# Patient Record
Sex: Male | Born: 1955 | Race: White | Hispanic: No | Marital: Married | Smoking: Former smoker
Health system: Southern US, Community
[De-identification: ages and names within clinical notes are randomized; demographics above are authoritative.]

## PROBLEM LIST (undated history)

## (undated) DIAGNOSIS — M1990 Unspecified osteoarthritis, unspecified site: Secondary | ICD-10-CM

## (undated) DIAGNOSIS — M1711 Unilateral primary osteoarthritis, right knee: Secondary | ICD-10-CM

## (undated) DIAGNOSIS — R011 Cardiac murmur, unspecified: Secondary | ICD-10-CM

## (undated) DIAGNOSIS — D649 Anemia, unspecified: Secondary | ICD-10-CM

## (undated) DIAGNOSIS — M25331 Other instability, right wrist: Secondary | ICD-10-CM

## (undated) DIAGNOSIS — E559 Vitamin D deficiency, unspecified: Secondary | ICD-10-CM

## (undated) DIAGNOSIS — E785 Hyperlipidemia, unspecified: Secondary | ICD-10-CM

## (undated) DIAGNOSIS — I10 Essential (primary) hypertension: Secondary | ICD-10-CM

## (undated) HISTORY — DX: Vitamin D deficiency, unspecified: E55.9

## (undated) HISTORY — PX: TONSILLECTOMY: SUR1361

## (undated) HISTORY — PX: KNEE ARTHROSCOPY: SUR90

## (undated) HISTORY — DX: Anemia, unspecified: D64.9

---

## 2002-06-08 ENCOUNTER — Encounter (HOSPITAL_COMMUNITY): Admission: RE | Admit: 2002-06-08 | Discharge: 2002-07-08 | Payer: Self-pay | Admitting: *Deleted

## 2002-06-22 ENCOUNTER — Ambulatory Visit (HOSPITAL_COMMUNITY): Admission: RE | Admit: 2002-06-22 | Discharge: 2002-06-22 | Payer: Self-pay | Admitting: *Deleted

## 2002-06-25 ENCOUNTER — Ambulatory Visit (HOSPITAL_COMMUNITY): Admission: RE | Admit: 2002-06-25 | Discharge: 2002-06-25 | Payer: Self-pay | Admitting: *Deleted

## 2002-07-02 ENCOUNTER — Ambulatory Visit (HOSPITAL_COMMUNITY): Admission: RE | Admit: 2002-07-02 | Discharge: 2002-07-02 | Payer: Self-pay | Admitting: Family Medicine

## 2002-07-02 ENCOUNTER — Encounter: Payer: Self-pay | Admitting: Family Medicine

## 2003-04-10 HISTORY — PX: CARDIAC CATHETERIZATION: SHX172

## 2003-09-30 ENCOUNTER — Ambulatory Visit (HOSPITAL_COMMUNITY): Admission: RE | Admit: 2003-09-30 | Discharge: 2003-09-30 | Payer: Self-pay | Admitting: *Deleted

## 2004-04-21 ENCOUNTER — Ambulatory Visit: Payer: Self-pay | Admitting: Family Medicine

## 2004-06-06 ENCOUNTER — Ambulatory Visit: Payer: Self-pay | Admitting: Family Medicine

## 2004-08-30 ENCOUNTER — Ambulatory Visit: Payer: Self-pay | Admitting: Family Medicine

## 2004-09-13 ENCOUNTER — Ambulatory Visit: Payer: Self-pay | Admitting: Family Medicine

## 2004-10-11 ENCOUNTER — Ambulatory Visit: Payer: Self-pay | Admitting: Family Medicine

## 2005-02-12 ENCOUNTER — Ambulatory Visit: Payer: Self-pay | Admitting: Family Medicine

## 2005-03-12 ENCOUNTER — Ambulatory Visit: Payer: Self-pay | Admitting: Family Medicine

## 2005-05-01 ENCOUNTER — Ambulatory Visit: Payer: Self-pay | Admitting: Family Medicine

## 2005-07-04 ENCOUNTER — Ambulatory Visit: Payer: Self-pay | Admitting: Family Medicine

## 2005-07-24 ENCOUNTER — Ambulatory Visit: Payer: Self-pay | Admitting: *Deleted

## 2005-08-06 ENCOUNTER — Ambulatory Visit: Payer: Self-pay | Admitting: Family Medicine

## 2005-11-05 ENCOUNTER — Ambulatory Visit: Payer: Self-pay | Admitting: Family Medicine

## 2006-01-31 ENCOUNTER — Ambulatory Visit: Payer: Self-pay | Admitting: Family Medicine

## 2006-02-11 ENCOUNTER — Ambulatory Visit: Payer: Self-pay | Admitting: Family Medicine

## 2006-09-12 ENCOUNTER — Ambulatory Visit: Payer: Self-pay | Admitting: Cardiovascular Disease

## 2007-02-19 ENCOUNTER — Ambulatory Visit: Payer: Self-pay | Admitting: Cardiovascular Disease

## 2007-02-19 ENCOUNTER — Encounter (HOSPITAL_COMMUNITY): Admission: RE | Admit: 2007-02-19 | Discharge: 2007-03-21 | Payer: Self-pay | Admitting: Cardiovascular Disease

## 2007-04-07 ENCOUNTER — Ambulatory Visit: Payer: Self-pay | Admitting: Cardiovascular Disease

## 2007-04-10 HISTORY — PX: CARPAL TUNNEL RELEASE: SHX101

## 2007-11-05 ENCOUNTER — Ambulatory Visit: Payer: Self-pay | Admitting: Cardiovascular Disease

## 2008-03-18 ENCOUNTER — Ambulatory Visit (HOSPITAL_BASED_OUTPATIENT_CLINIC_OR_DEPARTMENT_OTHER): Admission: RE | Admit: 2008-03-18 | Discharge: 2008-03-18 | Payer: Self-pay | Admitting: Orthopedic Surgery

## 2008-03-18 DIAGNOSIS — Z9889 Other specified postprocedural states: Secondary | ICD-10-CM | POA: Insufficient documentation

## 2008-06-22 LAB — HM COLONOSCOPY

## 2008-07-23 ENCOUNTER — Telehealth: Payer: Self-pay | Admitting: Cardiovascular Disease

## 2008-10-06 DIAGNOSIS — I10 Essential (primary) hypertension: Secondary | ICD-10-CM

## 2008-10-06 DIAGNOSIS — I152 Hypertension secondary to endocrine disorders: Secondary | ICD-10-CM | POA: Insufficient documentation

## 2008-10-06 DIAGNOSIS — E119 Type 2 diabetes mellitus without complications: Secondary | ICD-10-CM | POA: Insufficient documentation

## 2008-10-06 DIAGNOSIS — E1159 Type 2 diabetes mellitus with other circulatory complications: Secondary | ICD-10-CM

## 2008-10-06 DIAGNOSIS — M069 Rheumatoid arthritis, unspecified: Secondary | ICD-10-CM | POA: Insufficient documentation

## 2008-10-06 DIAGNOSIS — E1169 Type 2 diabetes mellitus with other specified complication: Secondary | ICD-10-CM | POA: Insufficient documentation

## 2008-10-06 DIAGNOSIS — E785 Hyperlipidemia, unspecified: Secondary | ICD-10-CM

## 2008-10-07 ENCOUNTER — Ambulatory Visit: Payer: Self-pay | Admitting: Cardiovascular Disease

## 2009-04-11 ENCOUNTER — Ambulatory Visit: Payer: Self-pay | Admitting: Cardiovascular Disease

## 2009-10-07 ENCOUNTER — Encounter: Payer: Self-pay | Admitting: Cardiovascular Disease

## 2010-03-06 ENCOUNTER — Encounter: Payer: Self-pay | Admitting: Cardiovascular Disease

## 2010-04-09 HISTORY — PX: OTHER SURGICAL HISTORY: SHX169

## 2010-05-09 ENCOUNTER — Encounter: Payer: Self-pay | Admitting: Cardiovascular Disease

## 2010-05-09 ENCOUNTER — Ambulatory Visit
Admission: RE | Admit: 2010-05-09 | Discharge: 2010-05-09 | Payer: Self-pay | Source: Home / Self Care | Attending: Cardiovascular Disease | Admitting: Cardiovascular Disease

## 2010-05-09 NOTE — Assessment & Plan Note (Signed)
Summary: 6 mo F/U   History of Present Illness: [name] is seen today in F/U of CRF's.  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  I reviewed his risk factor profile from [facility].  His HDL was low at 32.  He was given samples of Niaspan by Dr Elana Alm and I told him he should try them  He will take a coated aspirin 30 minutes prior to .  His weight is still up and he needs to focus on a more low carb diet.  He eats out a lot and has large portions.  He has seen a nutrutionist and is working on this.  He denies recurrent SSCP.  He has lower back problems and complains about his legs aching in the morning.  He has been compliant with his meds.  He quit smoking in 1998.  He is sedentary and still overweight with a BMI of 37.4.  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 1 Tab By Mouth Once Daily 2)  Aspirin 81 Mg Tabs (Aspirin) .... Take By Mouth Once Daily 3)  Metformin Hcl 500 Mg Tabs (Metformin Hcl) .... 2 Tabs By Mouth Two Times A Day 4)  Simvastatin 10 Mg Tabs (Simvastatin) .... Take One Tablet By Mouth Daily At Bedtime 5)  Meloxicam 15 Mg Tabs (Meloxicam) .[name].. 1 Tab By Mouth Once Daily  Allergies (verified): 1)  ! * Shellfish  Past History:  Past Medical History: Last updated: 10/06/2008 ARTHRITIS (ICD-716.90) HYPERLIPIDEMIA (ICD-272.4) DIABETES MELLITUS (ICD-250.00) HYPERTENSION (ICD-401.9) CARDIOMYOPATHY, HX OF (ICD-425.4)    Past Surgical History: Last updated: 10/06/2008 CARPAL TUNNEL RELEASE, LEFT, HX OF (ICD-V45.89)  Family History: Last updated: 10/06/2008 Father:hx of cva, hypertension,  diabetes Mother:well Siblings: 2 sisters alive and well.  Social History: Last updated: 10/06/2008 Full Time Married  2 children  Review of Systems       Denies fever, malais, weight loss, blurry vision, decreased visual acuity, cough, sputum, SOB, hemoptysis, pleuritic pain, palpitaitons, heartburn, abdominal pain, melena, lower extremity edema, claudication, or rash. All other systems reviewed and negative  Vital Signs:  Patient profile:   55 year old male Height:      70 inches Weight:      260 pounds BMI:     37.44 Pulse rate:   80 / minute Resp:     12 per minute BP sitting:   140 / 80  (left arm)  Vitals Entered By: Kem Parkinson (April 11, 2009 3:12 PM)  Physical Exam  General:  Affect appropriate Healthy:  appears stated age HEENT: normal Neck supple with no adenopathy JVP normal no bruits no thyromegaly Lungs clear with no wheezing and good diaphragmatic motion Heart:  S1/S2 no murmur,rub, gallop or click PMI normal Abdomen: benighn, BS positve, no tenderness, no AAA no bruit.  No HSM or HJR Distal pulses intact with no bruits No edema Neuro non-focal Skin warm and dry    Impression & Recommendations:  Problem # 1:  HYPERLIPIDEMIA (ICD-272.4) Add niaspan  F/U primary His updated medication list for this problem includes:    Simvastatin 10 Mg Tabs (Simvastatin) .Marland KitchenMarland KitchenMarland KitchenMarland Kitchen  Take one tablet by mouth daily at bedtime  Problem # 2:  HYPERTENSION (ICD-401.9) Good control continue low sodium diet His updated medication list for this problem includes:    Cozaar 100 Mg Tabs (Losartan potassium) .[name]... 1 tab by mouth once daily    Aspirin 81 Mg Tabs (Aspirin) .[name]... Take by mouth once daily  Problem # 3:  CARDIOMYOPATHY, HX OF (ICD-425.4) Consider F/U MRI in a year.  Most recent EF normal by echo  His updated medication list for this problem includes:    Cozaar 100 Mg Tabs (Losartan potassium) .[name]... 1 tab by mouth once daily    Aspirin 81 Mg Tabs (Aspirin)  .[name]... Take by mouth once daily  Patient Instructions: 1)  Your physician recommends that you schedule a follow-up appointment in: ONE YEAR

## 2010-05-17 NOTE — Assessment & Plan Note (Signed)
Summary: F1Y/DM   History of Present Illness: [name] is seen today in F/U of CRF's.  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  I reviewed his risk factor profile from [facility].  His HDL was low at 32.  He was given samples of Niaspan by Dr Elana Alm and I told him he should try them  He will take a coated aspirin 30 minutes prior to .  His weight is still up and he needs to focus on a more low carb diet.  He eats out a lot and has large portions.  He has seen a nutrutionist and is working on this.  He denies recurrent SSCP.  He has lower back problems and complains about his legs aching in the morning.  He has been compliant with his meds.  He quit smoking in 1998.  He is sedentary and still overweight with a BMI of 37.4.  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 1/2 Tab By Mouth Once Daily 2)  Aspirin 81 Mg Tabs (Aspirin) .... Take By Mouth Once Daily 3)  Metformin Hcl 500 Mg Tabs (Metformin Hcl) .... 2 Tabs By Mouth Two Times A Day 4)  Simvastatin 10 Mg Tabs (Simvastatin) .... Take One Tablet By Mouth Daily At Bedtime 5)  Etodolac 500 Mg Tabs (Etodolac) .[name].. 1 Tab By Mouth Once Daily  Allergies (verified): 1)  ! * Shellfish  Past History:  Past Medical History: Last updated: 10/06/2008 ARTHRITIS (ICD-716.90) HYPERLIPIDEMIA (ICD-272.4) DIABETES MELLITUS (ICD-250.00) HYPERTENSION (ICD-401.9) CARDIOMYOPATHY, HX OF (ICD-425.4)    Past Surgical History: Last updated:  10/06/2008 CARPAL TUNNEL RELEASE, LEFT, HX OF (ICD-V45.89)  Family History: Last updated: 10/06/2008 Father:hx of cva, hypertension, diabetes Mother:well Siblings: 2 sisters alive and well.  Social History: Last updated: 10/06/2008 Full Time Married  2 children  Review of Systems       Denies fever, malais, weight loss, blurry vision, decreased visual acuity, cough, sputum, SOB, hemoptysis, pleuritic pain, palpitaitons, heartburn, abdominal pain, melena, lower extremity edema, claudication, or rash.   Vital Signs:  Patient profile:   55 year old male Height:      70 inches Weight:      257 pounds BMI:     37.01 Pulse rate:   76 / minute Resp:     14 per minute BP sitting:   140 / 82  (left arm)  Vitals Entered By: Kem Parkinson (May 09, 2010 3:28 PM)  Physical Exam  General:  Affect appropriate Healthy:  appears stated age HEENT: normal Neck supple with no adenopathy JVP normal no bruits no thyromegaly Lungs clear with no wheezing and good diaphragmatic motion Heart:  S1/S2 no murmur,rub, gallop or click PMI normal Abdomen: benighn, BS positve, no tenderness, no AAA no bruit.  No HSM or HJR Distal pulses intact with no bruits No edema Neuro non-focal Skin warm and dry    Impression & Recommendations:  Problem # 1:  HYPERLIPIDEMIA (ICD-272.4) Excellent reults with no  side effects His updated medication list for this problem includes:    Simvastatin 10 Mg Tabs (Simvastatin) .[name]... Take one tablet by mouth daily at bedtime  Problem # 2:  HYPERTENSION (ICD-401.9) Weight loss and low sodium diet with ARB  may need diuretic in future His updated medication list for this problem includes:    Cozaar 100 Mg Tabs (Losartan potassium) .[name]... 1/2 tab by mouth once daily    Aspirin 81 Mg Tabs (Aspirin) .[name]... Take by mouth once daily  Problem # 3:  ARTHRITIS (ICD-716.90) Prefer naproxen or tramadol as pain meds.  Avoid Cox 2  inhibitors Prescriptions: SIMVASTATIN 10 MG TABS (SIMVASTATIN) Take one tablet by mouth daily at bedtime  #90 x 3   Entered by:   Kem Parkinson   Authorized by:   Colon Branch, MD, [facility]   Signed by:   Kem Parkinson on 05/09/2010   Method used:   Electronically to        [facility]* (retail)             ,          Ph: [phone]       Fax: [phone]   RxID:   [id number] COZAAR 100 MG TABS (LOSARTAN POTASSIUM) 1/2 tab by mouth once daily  #90 x 3   Entered by:   Kem Parkinson   Authorized by:   Colon Branch, MD, [facility]   Signed by:   Kem Parkinson on 05/09/2010   Method used:   Electronically to        [facility]* (retail)             ,          Ph: [phone]       Fax: [phone]   RxID:   [id number]    EKG Report  Procedure date:  05/09/2010  Findings:      NSR 76 QRS 118 IVCD Borderline ECG

## 2010-06-23 LAB — HM DIABETES EYE EXAM

## 2010-08-22 NOTE — Assessment & Plan Note (Signed)
[facility]                       [place] CARDIOLOGY OFFICE NOTE   NAME:[name], [name]                      MRN:          [id number]  DATE:09/12/2006                            DOB:          08/11/1955    The patient is seen today as a new patient by me. He has previously been  seen by Dr.  Anthonette Legato.   The patient is overweight, diabetic, hypercholesterolemic with  borderline hypertension.   When he was last seen by Dr.  Anthonette Legato a decision was made to stop his  statin drug. At the time his LDL was 58 and he was on 40 of simvastatin.  His labs have not been rechecked.   The patient has been on ACE inhibitor in the past. He is not sure which  one. I think the idea was that he had borderline hypertension and  diabetes and it would help with proteinuria. He stopped recent pill  prescribed by Dr.  Lysbeth Galas because it made him feel ill with personality  change.   He has however tolerated Cozaar in the past.   The patient's diabetes has been fairly well-controlled. He does not  check his blood sugars regularly at home. He tells me after an  explanation of what hemoglobin A1c was that his normally runs in the  6's. He used to be on two oral hypoglycemics, but his sugar got too low.  He therefore was it sounds like his Glucophage was stopped and he was  maintained on Actos. Since Dr.  Anthonette Legato last saw the patient on July 24, 2005, he has now gained 10 pounds. I explained to him that heart doctors  do not like Actos due to its weight gaining properties and fluid  retention and exacerbation of heart failure and hypertension.   He will talk to Dr.  Lysbeth Galas about stopping Actos and starting a  different oral hypoglycemic.   We had a bit of a discussion about weight loss and caloric intake and  also about watching his sugars more strictly.   In regards to his heart, he has not had any significant chest pain, PND  or orthopnea. He has had some chronic low  level dyspnea which is  probably from his weight and deconditioning.   PAST MEDICAL HISTORY:  Is remarkable for question of previous non-  ischemic cardiomyopathy. Apparently his EF used to be fairly low and he  had a normal heart cath back in March of 2004. His last echo in June of  2005, however, showed his EF to be 65-70%.   He also has a history of hypertension, diabetes, hypercholesterolemia.   He has not had previous surgeries.   REVIEW OF SYSTEMS:  Is otherwise negative.   Family History non-contributory   CURRENT MEDICATIONS:  1. Actos 15 a day.  2. Aspirin a day.  3. Indomethacin p.r.n.   His weight is 278, respiratory rate is 16. He is afebrile. Blood  pressure is 136/80, pulse 76 and regular.  HEENT: Is normal. No carotid bruits. No lymphadenopathy. No JVP  elevation. No thyromegaly.  LUNGS:  Are clear with normal diaphragmatic  motion. No wheezing. There  is an S1, S2 with distant heart sounds. PMI is not palpable.  ABDOMEN: Is protuberant. Bowel sounds are positive. There is no  tenderness. No AAA. No hepatosplenomegaly or hepatojugular reflux.  Femorals are +3 without bruit. PT's are +2 bilaterally. There is no  lower extremity edema.  NEURO: Is nonfocal.  SKIN: Is warm and dry.  There is no muscular weakness.   IMPRESSION:  1. Stable. No cardiac symptoms.  2. Borderline hypertension, to be restarted on Cozaar 50 mg a day.      Followup blood pressure at home and also see what his systolic      pressures do with exercise in six months.  3. Overweight diabetic with multiple risk factors. No stress test      since 2004. Followup Myoview in six months.  4. Diabetes. Followup with Dr.  Lysbeth Galas consider switching Actos to a      different oral hypoglycemic. Check sugars at home more diligently      and record hemoglobin A1c.  5. Recheck lipid and liver profile and see how high his LDL has gotten      off statin therapy. Since he does not appear very motivated to       treat his other risk factors I am not so sure that he should not be      on a statin drug.  6. Stroke prophylaxis. Continue an aspirin a day.   I will see the patient back in six months for a stress test.     Theron Arista C. Eden Emms, MD, [facility]  Electronically Signed    PCN/MedQ  DD: 09/12/2006  DT: 09/12/2006  Job #: [id number]

## 2010-08-22 NOTE — Assessment & Plan Note (Signed)
[name]                       [place] CARDIOLOGY OFFICE NOTE   NAME:[name], [name]                      MRN:          [id number]  DATE:04/07/2007                            DOB:          [birth year]    [name] returns today for follow-up.  He has multiple coronary risk  factors.  When I last saw him he needed further therapy for his blood  pressure.  He had a stress test done in the middle of November by  myself, his systolics reached 200, he was nonischemic with an EF of 59%,  he was able to walk 8 minutes on the treadmill.  He has had a history of  a question of nonischemic cardiomyopathy in the past with EFs in the 50%  range.  These seem to be normal now, he needs primarily risk factor  modification at this point.   When I last saw him I wanted him off Actos and Dr. Lysbeth Galas has done this,  now is taking Glucophage.  He has not noticed any changes.   He has previously been on a statin drug, Dr. Lysbeth Galas did a LipoMed  profile on him and his small particle size is up with an LDL of 117, an  HDL of 38 and I told the patient that he should probably try another  statin drug.  Apparently Dr. Lysbeth Galas has mentioned Lipitor to him, told  him I would give him a prescription for 10 mg to start.  His previous  reaction to simvastatin was flu-like symptoms.   He did not have myalgias in regards to his hypertension after his stress  test.  We had put him on Cozaar 50 mg a day, we will increase this to  100 mg a day and see how his blood pressure does.   REVIEW OF SYSTEMS:  Remarkable for no significant chest pain, PND or  orthopnea.  He has had a recent viral infection but no fevers.   CURRENT MEDICATION:  1. An aspirin a day.  2. Indocin 25 t.i.d.  3. Cozaar 50 day.  4. Glucophage 500 b.i.d.   I cautioned him about the use of nonsteroidals and his blood pressure.   EXAM:  Remarkable for an overweight white male in no distress, affect is  appropriate.  Weight is 267, blood pressure is 134/90, pulse 64 and  regular, respiratory rate 14.  HEENT:  Unremarkable.  Carotids normal without bruit.  No  lymphadenopathy, thyromegaly, JVP elevation.  LUNGS:  Clear with good diaphragmatic motion, no wheezing.  S1-S2 with distant heart sounds, PMI not palpable.  ABDOMEN:  Benign, bowel sounds positive, no renal bruits, no tenderness,  no hepatosplenomegaly or hepatojugular reflux.  Distal pulses are intact, no edema.  NEURO:  Nonfocal.  SKIN:  Warm and dry.  No muscular weakness.   IMPRESSION:  1. Previous decreased left ventricular function, question nonischemic      cardiomyopathy.  Left ventricular function appears normal now,      continue risk factor modification.  2. Hypertension, increase Cozaar from 50-100 mg a day.  Continue low      salt diet.  3. Hypercholesterolemia, start Lipitor 10 mg a day, Follow-up with Dr.      Lysbeth Galas.  Liver profile in 8 weeks.  4. Diabetes, continue Glucophage 500 b.i.d., hemoglobin A1c quarterly.  5. Recent viral illness, continue conservative care.  No indication      for antibiotics.  Follow-up with primary care MD   I will see the patient back in 6 months.  He will call Dr. Lysbeth Galas if he  has any significant problems in regards to his Lipitor.     Cameron Pick. Eden Emms, MD, [name]  Electronically Signed    PCN/MedQ  DD: 04/07/2007  DT: 04/07/2007  Job #: [id number]

## 2010-08-22 NOTE — Assessment & Plan Note (Signed)
[facility]                            CARDIOLOGY OFFICE NOTE   NAME:[name], [name]                      MRN:          [id number]  DATE:11/05/2007                            DOB:          Aug 16, 1955    [name] returns today for followup.  He has had a presumed  nonischemic cardiomyopathy with a normal cath in 2004.  He had a Myoview  study in November 2008.  His LV function appears to have return to the  normal range of 59% with no ischemia.  Coronary risk factors includes  hypercholesterolemia, diabetes, and hyperlipidemia.  He had not been  taking his Lipitor due to cost.  I told him, I would give him a generic  prescription for Zocor that he could get for $4 a month.   He has been compliant with his meds.  The Cozaar has done a good job  with his blood pressure.  His hemoglobin A1c has been anywhere between  6.3 and 6.9 on Glucophage.   He continues to be overweight.  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   REVIEW OF SYSTEMS:  Remarkable for no PND or orthopnea.  No chest pain  or palpitations.   CURRENT MEDICATIONS:  Aspirin a day, indomethacin 25 t.i.d., Glucophage  500 b.i.d., Cozaar 100 a day, and hopefully Zocor 10 a day.   PHYSICAL EXAMINATION:  GENERAL:  Remarkable for an overweight white male  in no distress; however, his weight is down on September 12, 2006, he was 50;  on December 2008, he was 36; and now he is 254.  I congratulated him on  this.  VITAL SIGNS:  His blood pressure is 140/80, pulse 65 and regular,  respiratory rate 14, and afebrile.  HEENT:  Unremarkable.  NECK:  Carotids are normal without bruit.  No lymphadenopathy,  thyromegaly, or JVP elevation.  LUNGS:  Clear.  Good diaphragmatic motion.  No wheezing.  HEART:  S1 and S2 with distant heart sounds.  PMI not palpable.  ABDOMEN:  Protuberant.  Bowel sounds positive.  No AAA.  No tenderness.  No  bruit.  No hepatosplenomegaly.  No hepatojugular reflux.  No  tenderness.  EXTREMITIES:  Distal pulses are intact.  No edema.  NEUROLOGIC:  Nonfocal.  SKIN:  Warm and dry.  No muscular weakness.   IMPRESSION:  1. History of cardiomyopathy.  Left ventricular function appears      improved.  Follow up an echocardiogram in a year.  2. Hypertension.  Currently well controlled.  Low-salt diet.  Continue      Cozaar.  3. Diabetes.  Follow up with primary care MD.  Target hemoglobin A1c      less than 6.  4. Significant arthritis.  Continue indomethacin.  Consider switching      or supplementing with Naprosyn since this appears to be the safest      nonsteroidal for cardiac patients.  5. Hyperlipidemia.  His lipid studies are at borderline.  He has a low      HDL and LDL.  Cholesterol is in the 95 range; however, since he is      a diabetic I think he would probably benefit from low-dose statin.      We gave him prescriptions for Zocor to fill either on a 51-month or      55-month basis depending on where he can get at the cheapest.  I did      remind him that he had to get his liver test in 2-3 months after      starting a statin drug.     Noralyn Pick. Eden Emms, MD, [facility]  Electronically Signed    PCN/MedQ  DD: 11/05/2007  DT: 11/06/2007  Job #: [id number]

## 2010-08-22 NOTE — Op Note (Signed)
[name], [name]               ACCOUNT NO.:  [id number]   MEDICAL RECORD NO.:  [id number]          PATIENT TYPE:  AMB   LOCATION:  [facility]                          FACILITY:  [facility]   PHYSICIAN:  Rodney A. Mortenson, M.D.DATE OF BIRTH:  December 07, 1955   DATE OF PROCEDURE:  03/18/2008  DATE OF DISCHARGE:                               OPERATIVE REPORT   JUSTIFICATION:  A 52-year male with bilateral carpal tunnel proven with  nerve conduction studies.  He has had release on the right and he is  back now to have release on the left hand.  Complications discussed  preoperatively.  Questions answered and encouraged.  The patient was  well informed.  The patient wishes to proceed.   JUSTIFICATION OUTPATIENT SURGERY:  Minimal morbidity.   PREOPERATIVE DIAGNOSIS:  Left carpal tunnel.   POSTOPERATIVE DIAGNOSIS:  Left carpal tunnel.   OPERATION:  Release of transverse carpal ligament, left wrist and  decompression of median nerve, left wrist.   SURGEON:  Rodney A. Chaney Malling, MD   ANESTHESIA:  MAC.   PROCEDURE:  The patient was placed on the operating table in supine  position with pneumatic tourniquet above left upper arm.  Left upper  extremity was prepped with DuraPrep and draped out in the usual manner.  The area of incision was infiltrated with combination of Marcaine and  Xylocaine and the hand was wrapped on and tourniquet was elevated.  Using loupe magnification, a lazy S incision started proximal to the  volar wrist crease and carried out into the mid palmar space.  Skin  edges were retracted.  Fascia in the palm was opened and the median  nerve was identified and isolated.  Curved Mayo scissors was placed  underneath the transverse ligament but above the median nerve to protect  the median nerve.  Under direct vision, the transverse carpal ligament  was released off the ulnar border of the carpal canal out to the mid  palmar space.  Complete decompression of the transverse ligament  was  achieved.  This was markedly thick.  There was compression of nerve  underneath the transverse ligament and some loss of vascular markings.  No space-occupying lesions were seen.  The wound was then closed with  interrupted 3-0 nylon suture.  A large bulky pressure dressing applied,  and the patient returned to recovery room in excellent condition.  Technically, this procedure went extremely well.   DRAINS:  None.   COMPLICATIONS:  None.   DISPOSITION:  1. Percocet for pain.  2. Usual postop instructions.  3. Return to my office on Wednesday for followup exam.      [name], M.D.  Electronically Signed     RAM/MEDQ  D:  03/18/2008  T:  03/19/2008  Job:  [id number]

## 2010-08-25 NOTE — Procedures (Signed)
NAME:  [name], [name] NO.:  [id number]   MEDICAL RECORD NO.:  [id number]                   PATIENT TYPE:  OUT   LOCATION:  RAD                                  FACILITY:  [facility]   PHYSICIAN:  Vida Roller, M.D.                DATE OF BIRTH:  [birth year]   DATE OF PROCEDURE:  09/30/2003  DATE OF DISCHARGE:                                  ECHOCARDIOGRAM   PRIMARY CARE PHYSICIAN:  Dr. Christell Constant   Tape Number [phone], Tape Count [phone] through [phone].   A 55 year old male, assessment for left ventricular systolic function, no  previous echocardiogram.   Technical quality of the study is adequate.   M-MODE TRACING:  1. The aorta is 33 mm.  2. The left atrium is 43 mm.  3. The septum is 15 mm.  4. Posterior wall is 13 mm.  5. Left ventricular diastolic dimension is 46 mm.  6. Left ventricular systolic dimension is 29 mm.   2-D AND DOPPLER IMAGING:  1. The left ventricle is normal size with concentric left ventricular     hypertrophy.  There is normal to vigorous ejection fraction estimated at     65 to 70%.  There are no wall motion abnormalities seen.  2. The right ventricle appears to be top normal in size.  There is normal     systolic function.  3. Both atria appear to be enlarged, right greater than left.  There is no     obvious atrioseptal defect.  4. The aortic valve is mildly sclerotic with no evidence of stenosis or     regurgitation.  5. The mitral valve is morphologically unremarkable with no stenosis or     regurgitation.  6. The tricuspid valve is morphologically unremarkable with mild     insufficiency.  No stenosis is seen.  7. The pulmonic valve is not well seen but appears to have trace     insufficiency.  8. Pericardial structures are not well seen, but there is no obvious     pericardial effusion.  9. The ascending aorta is not well seen.  10.      The inferior vena cava was not seen.      ___________________________________________                                            Vida Roller, M.D.   JH/MEDQ  D:  10/01/2003  T:  10/01/2003  Job:  [id number]

## 2010-08-25 NOTE — Procedures (Signed)
   NAMEJONUEL, BUTTERFIELD NO.:  [id number]   MEDICAL RECORD NO.:  [id number]                   PATIENT TYPE:  PREC   LOCATION:                                       FACILITY:   PHYSICIAN:  Vida Roller, M.D.                DATE OF BIRTH:  Aug 10, 1955   DATE OF PROCEDURE:  06/08/2002  DATE OF DISCHARGE:                                    STRESS TEST   EXERCISE CARDIOLITE   INDICATION:  Mr. Decesare is a 55 year old male with no known coronary artery  disease, who presented with atypical chest discomfort.  His cardiac risk  factors do include tobacco, family history, male sex and age.   BASELINE DATA:  EKG:  Sinus rhythm at 53 beats/min.  Blood pressure 134/78.   The patient exercised for a total of  7 minutes and 28 seconds to 10.1 METS.  He reached stage 3 of the Bruce protocol.  His maximum heart rate was 152  beats/min. which is 88% of maximum predicted.  Maximum blood pressure is  190/90.  He had a normal blood pressure response.  EKG showed no ischemic  changes and no arrhythmias.  The patient reported shortness of breath toward  the end of stage 2 in the Bruce protocol.  The patient's exercise was  stopped secondary to fatigue and shortness of breath.  Final images and  results of this test are pending M.D. review.      Amy Mercy Riding, P.A. [facility]                     Vida Roller, M.D.    AB/MEDQ  D:  06/08/2002  T:  06/08/2002  Job:  [id number]

## 2010-08-25 NOTE — Cardiovascular Report (Signed)
NAMEKRISTIAN, Mclean NO.:  [id number]   MEDICAL RECORD NO.:  [id number]                   PATIENT TYPE:   LOCATION:                                       FACILITY:   PHYSICIAN:  Vida Roller, M.D.                DATE OF BIRTH:  03-05-1956   DATE OF PROCEDURE:  06/25/2002  DATE OF DISCHARGE:                              CARDIAC CATHETERIZATION   CARDIAC CATHETERIZATION   INVASIVE PHYSICIAN:  Vida Roller, M.D.   PROCEDURES PERFORMED:  1. Left heart catheterization.  2. Selective coronary angiography via the radial approach.   HISTORY OF PRESENT ILLNESS:  [name] is a 55 year old white male with a  history of tobacco abuse and hypertension, hyperlipidemia, who presented for  evaluation to [facility] for chest discomfort, which  was atypical for angina.  He underwent an exercise Cardiolite and had good  exercise tolerance, but had mildly depressed left ventricular function  without significant perfusion abnormalities, concerning for balanced  ischemia.  After discussions with him as an outpatient, he chose to have an  invasive assessment and was referred for right heart catheterization.   DETAILS OF PROCEDURE:  After obtaining informed consent, the patient was  brought to the cardiac catheterization laboratory in the fasting state.  There, he was prepped and draped in the usual sterile manner and the right  wrist was anesthetized using 1% lidocaine without epinephrine.  The right  radial artery was cannulated using a modified Seldinger technique with a #5  French 15 cm sheath.  Left heart catheterization was performed using a #5  [place] JL-3.5 which seated at the left coronary artery and a #5 [place] JR-4  which seated the right coronary artery.  No left ventriculogram was done.  At the conclusion of the procedure, the catheter was removed.  The patient  had the radial sheath removed.  Hemostasis was obtained with  direct manual  pressure.  A hemaband was positioned and the patient ambulated out of the  catheterization laboratory with assistance.   TOTAL FLUOROSCOPIC TIME:  8.5 minutes.   TOTAL IONIZED CONTRAST:  70 cc.   RESULTS:  Aortic pressures measured at 113/77 with a mean arterial pressure  of 85.   CORONARY ANGIOGRAPHY:  The left main coronary artery is a normal sized  artery which is angiographically unremarkable.   The left anterior descending coronary artery is a moderate sized vessel with  a single branching diagonal and traverses the apex and is angiographically  unremarkable.   The left circumflex coronary artery is a moderate caliber vessel that has a  single large obtuse marginal which branches along the anterolateral wall.  The ongoing circumflex is a small artery.  The right coronary artery is a  large dominant vessel which has a large posterior descending coronary artery  which branches in acute marginal which handles the posterolateral wall.   ASSESSMENT:  This is a gentleman with a mild amount of ischemic  cardiomyopathy, likely secondary to his hypertension which is reasonably now  well controlled with medications and our plan would be to treat him for his  secondary risk factors and pursue aggressive hypertensive control.                                                 Vida Roller, M.D.    JH/MEDQ  D:  06/25/2002  T:  06/26/2002  Job:  [id number]

## 2010-10-09 ENCOUNTER — Encounter: Payer: Self-pay | Admitting: Cardiovascular Disease

## 2010-11-17 ENCOUNTER — Encounter: Payer: Self-pay | Admitting: Cardiovascular Disease

## 2011-01-12 LAB — POCT I-STAT, CHEM 8
Creatinine, Ser: 1.1 mg/dL (ref 0.4–1.5)
HCT: 43 % (ref 39.0–52.0)
Hemoglobin: 14.6 g/dL (ref 13.0–17.0)
Potassium: 4 mEq/L (ref 3.5–5.1)
Sodium: 139 mEq/L (ref 135–145)
TCO2: 23 mmol/L (ref 0–100)

## 2011-01-12 LAB — GLUCOSE, CAPILLARY: Glucose-Capillary: 113 mg/dL — ABNORMAL HIGH (ref 70–99)

## 2011-03-05 ENCOUNTER — Encounter (HOSPITAL_BASED_OUTPATIENT_CLINIC_OR_DEPARTMENT_OTHER): Payer: Self-pay | Admitting: *Deleted

## 2011-03-05 NOTE — Progress Notes (Signed)
To bring all meds Overnight bag  

## 2011-03-07 NOTE — H&P (Signed)
February 14, 2011     Lunette Stands, MD [facility] FAX # [phone]  RE: Avelina Laine) Duanne Moron.  DOB:   [birth year]   Dear Tobi Bastos:  Thanks for sending Ardith Dark, Montez Hageman. for consult regarding his chronic left wrist pain predicament.  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  Shaili recommended that he begin methotrexate.  Unfortunately, on Methotrexate he developed significant elevation of his liver function test.  He recently returned to see you for evaluation of wrist pain.  You noted marked stiffness and internal derangement of the wrist.  He is now referred for an upper extremity orthopaedic consult.  Mr. Villagran past medical history is reviewed in detail.  He is 5'10" tall and weighs 236 pounds.  The pain in his left wrist is constant, severe, sharp in quality, throbbing at times, very much impairs his ability to function with the left hand at work and at home.  He cannot bear weight in a push-up position or in a knuckle load-bearing position due to severe wrist pain.  His painful wrist does interfere with sleep, virtually all activities of daily living and is improved only by rest and splinting.  He has been wearing a wrist forearm splint.    His past history reveals that he is allergic to Keflex.  He has type II diabetes.  Current medications include metformin 1000 mg. b.i.d., Losartan 50 mg. daily, simvastatin 10 mg. daily, aspirin 81 mg. daily, etodolac 500 mg. b.i.d.  Prior surgery, bilateral carpal tunnel release surgery in 2009, arthroscopic surgery right knee in 2005 and tonsillectomy in 1965.  Social history reveals that he is married, he is a smoker, he enjoys a rare alcoholic beverage.    Family history is detailed and  positive for diabetes affecting his father, grandmother and sister.  His father had coronary artery disease, hypertension, arthritis.  His sister has arthritis.    14-point review of systems reveals chronic arthrosis, corrective lenses, history of hypertension controlled by medication.  Lunette Stands, MD Page 2 February 13, 2010   RE: [name]  DOB:   [birth year]  Physical examination reveals a well appearing 55 year-old.  Inspection of his wrist reveals marked swelling over the radioscaphoid articulation on the left. He has Heberden's and Bouchard's nodes evidencing generalized osteoarthritis and prominence of his right and left long finger metacarpophalangeal joints due to underlying osteoarthritis.  Despite his degree of swelling in the small joints he reports his pain is primarily in the left wrist.  His wrist examination reveals range of motion palmar flexion right wrist 70, left wrist 20, dorsiflexion right wrist 55, left wrist less than 10 degrees.  He has a 40 degree elbow flexion contracture on the right, 35 degrees on the left, further flexion 130 degrees bilaterally.  His pronation/supination is preserved.  His pulse and cap refill are intact.  He has marked pain with any radiocarpal translation or Watson's type maneuver on the left.   Plain x-rays of his left wrist demonstrate a mature scapholunate advanced collapse predicament with complete loss of the hyaline articular cartilage between the proximal pole of the scaphoid and the capitolunate articulation.  He does have good preservation of his lunate facet of the distal radius.  He is ulnar neutral to  slightly positive.  He has loose bodies present dorsally.  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  PLAN:  We had lengthy discussion regarding treatment options for this predicament which include injection, splinting, scaphoid excision ulnar four-bone fusion and complete wrist fusion.   Of the choices,  in my judgement, at this point in time scaphoid excision and ulnar four-bone fusion is the appropriate choice.  We had lengthy discussion regarding the nature of this surgery to be performed on an outpatient basis with a 24 hour overnight observation and the expected aftercare.  He would need to be casted and/or splinted for approximately six weeks post-op followed by another four to six weeks of rehab.  I have advised him to request a 12 week of absence from work to accomplish surgery, but expect that we might be able to get him back as soon as ten weeks post-op.  We discussed in detail the potential risks and benefits of surgery which include failure to relieve all of his pain and possible development of a nonunion of the midcarpal fusion which might require revision surgery at a later date.  The other routine risks which include a low probability of infection, hardware failure and neurovascular injury were discussed in detail as well.   He would like to schedule surgery in the near term.  I will keep you informed of his progress. As always, thank you for the privilege of seeing your patients.     Best regards,     Katy Fitch. Deniel Mcquiston, M.D., Montez Hageman.  RVS:cmf T:  11.8.12

## 2011-03-07 NOTE — H&P (Addendum)
[name] is an 55 y.o. male.   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  Past Medical History  Diagnosis Date  . Hypertension   . Diabetes mellitus   . Hyperlipidemia   . Arthritis     Past Surgical History  Procedure Date  . Carpal tunnel release 2009    both hands  . Cardiac catheterization 2005    normal  . Tonsillectomy   . Knee arthroscopy     rt    History reviewed. No pertinent family history. Social History:  reports that he quit smoking about 20 years ago. He does not have any smokeless tobacco history on file. He reports that he does not drink alcohol or use illicit drugs.  Allergies:  Allergies  Allergen Reactions  . Keflex (Cephalexin)     Pt does not tol-nervous  . Shellfish Allergy     No current facility-administered medications on file as of .   Medications Prior to Admission  Medication Sig Dispense Refill  . aspirin 81 MG chewable tablet Chew 81 mg by mouth daily.        [name] losartan (COZAAR) 50 MG tablet Take 50 mg by mouth every evening.        . metFORMIN (GLUCOPHAGE) 1000 MG tablet Take 1,000 mg by mouth 2 (two) times daily with a meal.        . simvastatin (ZOCOR) 10 MG tablet Take 10 mg by mouth at bedtime.          No results found for this or any previous visit (from the past 48 hour(s)).  No results found.   Pertinent items are noted in HPI.  Height 5\' 10"  (1.778 m), weight 108.863 kg (240 lb).  General appearance: alert Head: Normocephalic, without obvious abnormality Neck: supple, symmetrical, trachea midline Resp: clear to auscultation bilaterally Cardio: regular rate and rhythm, S1, S2 normal, no murmur, click, rub or gallop GI: normal findings: bowel sounds normal Extremities: .   Inspection of his wrist reveals marked swelling over the radioscaphoid articulation on the left. He has Heberden's and Bouchard's nodes evidencing generalized osteoarthritis and prominence of his right and left long finger metacarpophalangeal joints due to underlying osteoarthritis.  Despite his degree of swelling in the small joints he reports his pain is primarily in the left wrist.  His wrist examination reveals range of motion palmar flexion right wrist 70, left wrist 20, dorsiflexion right wrist 55, left wrist less than 10 degrees.  He has a 40 degree elbow flexion contracture on the right, 35 degrees on the left, further flexion 130 degrees bilaterally.  His pronation/supination is preserved.  His pulse and cap refill are intact.  He has marked pain with any radiocarpal translation or Watson's type maneuver on the left.  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  PLAN:  We had lengthy discussion regarding treatment options for this predicament which include injection, splinting, scaphoid excision ulnar four-bone fusion and complete wrist fusion.    [name],[name] 03/07/2011, 9:38 PM    H&P documentation: 03/08/11  -History and Physical Reviewed  -Patient has been re-examined  -No change in the plan of care  [name],[name]

## 2011-03-08 ENCOUNTER — Encounter (HOSPITAL_BASED_OUTPATIENT_CLINIC_OR_DEPARTMENT_OTHER): Payer: Self-pay | Admitting: Anesthesiology

## 2011-03-08 ENCOUNTER — Encounter (HOSPITAL_BASED_OUTPATIENT_CLINIC_OR_DEPARTMENT_OTHER)
Admission: RE | Disposition: A | Discharge status: Home / Self Care | Payer: Self-pay | Source: Ambulatory Visit | Attending: Orthopedic Surgery

## 2011-03-08 ENCOUNTER — Encounter (HOSPITAL_BASED_OUTPATIENT_CLINIC_OR_DEPARTMENT_OTHER): Payer: Self-pay | Admitting: Orthopedic Surgery

## 2011-03-08 ENCOUNTER — Encounter (HOSPITAL_BASED_OUTPATIENT_CLINIC_OR_DEPARTMENT_OTHER): Payer: Self-pay | Admitting: *Deleted

## 2011-03-08 ENCOUNTER — Ambulatory Visit (HOSPITAL_BASED_OUTPATIENT_CLINIC_OR_DEPARTMENT_OTHER): Payer: BC Managed Care – PPO | Admitting: Anesthesiology

## 2011-03-08 ENCOUNTER — Ambulatory Visit (HOSPITAL_BASED_OUTPATIENT_CLINIC_OR_DEPARTMENT_OTHER)
Admission: RE | Admit: 2011-03-08 | Discharge: 2011-03-08 | Disposition: A | Discharge status: Home / Self Care | Payer: BC Managed Care – PPO | Source: Ambulatory Visit | Attending: Orthopedic Surgery | Admitting: Orthopedic Surgery

## 2011-03-08 DIAGNOSIS — E119 Type 2 diabetes mellitus without complications: Secondary | ICD-10-CM | POA: Insufficient documentation

## 2011-03-08 DIAGNOSIS — M249 Joint derangement, unspecified: Secondary | ICD-10-CM | POA: Insufficient documentation

## 2011-03-08 DIAGNOSIS — I10 Essential (primary) hypertension: Secondary | ICD-10-CM | POA: Insufficient documentation

## 2011-03-08 DIAGNOSIS — M19039 Primary osteoarthritis, unspecified wrist: Secondary | ICD-10-CM | POA: Insufficient documentation

## 2011-03-08 HISTORY — DX: Essential (primary) hypertension: I10

## 2011-03-08 HISTORY — DX: Hyperlipidemia, unspecified: E78.5

## 2011-03-08 HISTORY — DX: Unspecified osteoarthritis, unspecified site: M19.90

## 2011-03-08 LAB — POCT I-STAT, CHEM 8
Creatinine, Ser: 0.9 mg/dL (ref 0.50–1.35)
HCT: 43 % (ref 39.0–52.0)
Hemoglobin: 14.6 g/dL (ref 13.0–17.0)
Potassium: 4.2 mEq/L (ref 3.5–5.1)
Sodium: 142 mEq/L (ref 135–145)
TCO2: 25 mmol/L (ref 0–100)

## 2011-03-08 LAB — GLUCOSE, CAPILLARY: Glucose-Capillary: 147 mg/dL — ABNORMAL HIGH (ref 70–99)

## 2011-03-08 SURGERY — WRIST FUSION WITH ILIAC CREST BONE GRAFT
Anesthesia: General | Site: Wrist | Laterality: Left | Wound class: Clean

## 2011-03-08 MED ORDER — FENTANYL CITRATE 0.05 MG/ML IJ SOLN
50.0000 ug | INTRAMUSCULAR | Status: DC | PRN
  Administered 2011-03-08: 100 ug via INTRAVENOUS

## 2011-03-08 MED ORDER — PROPOFOL 10 MG/ML IV EMUL
INTRAVENOUS | Status: DC | PRN
  Administered 2011-03-08: 200 mg via INTRAVENOUS

## 2011-03-08 MED ORDER — ACETAMINOPHEN 10 MG/ML IV SOLN: 1000.0000 mg | Freq: Once | INTRAVENOUS | Status: DC | PRN

## 2011-03-08 MED ORDER — ACETAMINOPHEN 10 MG/ML IV SOLN
1000.0000 mg | Freq: Once | INTRAVENOUS | Status: AC
  Administered 2011-03-08 (×2): 1000 mg via INTRAVENOUS

## 2011-03-08 MED ORDER — LACTATED RINGERS IV SOLN
INTRAVENOUS | Status: DC
  Administered 2011-03-08 (×2): via INTRAVENOUS

## 2011-03-08 MED ORDER — MIDAZOLAM HCL 2 MG/2ML IJ SOLN
1.0000 mg | INTRAMUSCULAR | Status: DC | PRN
  Administered 2011-03-08: 2 mg via INTRAVENOUS

## 2011-03-08 MED ORDER — IBUPROFEN 600 MG PO TABS: 600.0000 mg | ORAL_TABLET | Freq: Four times a day (QID) | ORAL | Status: AC | PRN

## 2011-03-08 MED ORDER — HYDROMORPHONE HCL PF 1 MG/ML IJ SOLN: 0.2500 mg | INTRAMUSCULAR | Status: DC | PRN

## 2011-03-08 MED ORDER — CEFAZOLIN SODIUM 1-5 GM-% IV SOLN
INTRAVENOUS | Status: DC | PRN
  Administered 2011-03-08: 2 g via INTRAVENOUS

## 2011-03-08 MED ORDER — ONDANSETRON HCL 4 MG/2ML IJ SOLN
INTRAMUSCULAR | Status: DC | PRN
  Administered 2011-03-08: 4 mg via INTRAVENOUS

## 2011-03-08 MED ORDER — HYDROMORPHONE HCL 2 MG PO TABS: ORAL_TABLET | ORAL | Status: AC

## 2011-03-08 MED ORDER — PROMETHAZINE HCL 25 MG/ML IJ SOLN: 6.2500 mg | INTRAMUSCULAR | Status: DC | PRN

## 2011-03-08 MED ORDER — LIDOCAINE HCL 1.5 % IJ SOLN
INTRAMUSCULAR | Status: DC | PRN
  Administered 2011-03-08: 250 mg via INTRADERMAL

## 2011-03-08 MED ORDER — TETRACAINE HCL 1 % IJ SOLN
INTRAMUSCULAR | Status: DC | PRN
  Administered 2011-03-08: 20 mg via INTRASPINAL

## 2011-03-08 MED ORDER — CEPHALEXIN 500 MG PO CAPS: 500.0000 mg | ORAL_CAPSULE | Freq: Three times a day (TID) | ORAL | Status: AC

## 2011-03-08 SURGICAL SUPPLY — 68 items
BAG DECANTER FOR FLEXI CONT (MISCELLANEOUS) ×2 IMPLANT
BANDAGE ADHESIVE 1X3 (GAUZE/BANDAGES/DRESSINGS) IMPLANT
BANDAGE ELASTIC 3 VELCRO ST LF (GAUZE/BANDAGES/DRESSINGS) ×2 IMPLANT
BANDAGE ELASTIC 4 VELCRO ST LF (GAUZE/BANDAGES/DRESSINGS) ×2 IMPLANT
BIT DRILL CANN 2.7 DISP (BIT) ×1 IMPLANT
BIT DRILL SURG CANN F/COMP 2 (BIT) ×1 IMPLANT
BLADE AVERAGE 25X9 (BLADE) ×2 IMPLANT
BLADE MINI RND TIP GREEN BEAV (BLADE) IMPLANT
BLADE SURG 15 STRL LF DISP TIS (BLADE) ×1 IMPLANT
BLADE SURG 15 STRL SS (BLADE) ×2
BNDG CMPR 9X4 STRL LF SNTH (GAUZE/BANDAGES/DRESSINGS) ×1
BNDG ESMARK 4X9 LF (GAUZE/BANDAGES/DRESSINGS) ×2 IMPLANT
BRUSH SCRUB EZ PLAIN DRY (MISCELLANEOUS) ×2 IMPLANT
BUR EGG/OVAL CARBIDE (BURR) IMPLANT
CANISTER SUCTION 1200CC (MISCELLANEOUS) ×2 IMPLANT
CLOTH BEACON ORANGE TIMEOUT ST (SAFETY) ×2 IMPLANT
CORDS BIPOLAR (ELECTRODE) ×2 IMPLANT
COVER MAYO STAND STRL (DRAPES) ×2 IMPLANT
COVER TABLE BACK 60X90 (DRAPES) ×2 IMPLANT
CUFF TOURNIQUET SINGLE 18IN (TOURNIQUET CUFF) ×2 IMPLANT
DECANTER SPIKE VIAL GLASS SM (MISCELLANEOUS) ×2 IMPLANT
DRAPE EXTREMITY T 121X128X90 (DRAPE) ×2 IMPLANT
DRAPE INCISE IOBAN 66X45 STRL (DRAPES) IMPLANT
DRAPE OEC MINIVIEW 54X84 (DRAPES) ×2 IMPLANT
DRAPE PED LAPAROTOMY (DRAPES) IMPLANT
DRAPE SURG 17X23 STRL (DRAPES) ×2 IMPLANT
DRILL BIT 2.0 (BIT) ×1
DRILL BIT 2.7 (BIT) ×1
DRILL SURG CANN F/COMP 2 (BIT) ×1
DURAPREP 26ML APPLICATOR (WOUND CARE) IMPLANT
GAUZE XEROFORM 1X8 LF (GAUZE/BANDAGES/DRESSINGS) IMPLANT
GLOVE BIOGEL M STRL SZ7.5 (GLOVE) ×2 IMPLANT
GLOVE ECLIPSE 6.5 STRL STRAW (GLOVE) ×4 IMPLANT
GLOVE ORTHO TXT STRL SZ7.5 (GLOVE) ×2 IMPLANT
GOWN PREVENTION PLUS XLARGE (GOWN DISPOSABLE) ×2 IMPLANT
GOWN PREVENTION PLUS XXLARGE (GOWN DISPOSABLE) ×4 IMPLANT
GUIDEWIRE (WIRE) ×1
GUIDEWIRE 0.9MM (WIRE) ×1 IMPLANT
KWIRE 4.0 X .045IN (WIRE) IMPLANT
KWIRE 4.0 X .062IN (WIRE) IMPLANT
LOOP VESSEL MAXI BLUE (MISCELLANEOUS) IMPLANT
NEEDLE 27GAX1X1/2 (NEEDLE) IMPLANT
PACK BASIN DAY SURGERY FS (CUSTOM PROCEDURE TRAY) ×2 IMPLANT
PAD CAST 3X4 CTTN HI CHSV (CAST SUPPLIES) ×2 IMPLANT
PADDING CAST ABS 4INX4YD NS (CAST SUPPLIES) ×2
PADDING CAST ABS COTTON 4X4 ST (CAST SUPPLIES) ×2 IMPLANT
PADDING CAST COTTON 3X4 STRL (CAST SUPPLIES) ×4
PLATE XPOST 75DEGX16MM (Plate) ×2 IMPLANT
SCREW LAG TAPER 20MM (Screw) ×2 IMPLANT
SCREW MINI ACTRK 2 16MM (Screw) ×4 IMPLANT
SLEEVE SCD COMPRESS KNEE MED (MISCELLANEOUS) ×2 IMPLANT
SPLINT PLASTER CAST XFAST 3X15 (CAST SUPPLIES) ×20 IMPLANT
SPLINT PLASTER XTRA FASTSET 3X (CAST SUPPLIES) ×20
SPONGE GAUZE 4X4 12PLY (GAUZE/BANDAGES/DRESSINGS) ×2 IMPLANT
STOCKINETTE 4X48 STRL (DRAPES) ×2 IMPLANT
STRIP CLOSURE SKIN 1/2X4 (GAUZE/BANDAGES/DRESSINGS) ×2 IMPLANT
SUCTION FRAZIER TIP 10 FR DISP (SUCTIONS) ×2 IMPLANT
SUT ETHIBOND 3-0 V-5 (SUTURE) ×2 IMPLANT
SUT PROLENE 3 0 PS 2 (SUTURE) ×2 IMPLANT
SUT VIC AB 2-0 PS2 27 (SUTURE) IMPLANT
SUT VIC AB 3-0 FS2 27 (SUTURE) IMPLANT
SYR 3ML 23GX1 SAFETY (SYRINGE) IMPLANT
SYR BULB 3OZ (MISCELLANEOUS) ×2 IMPLANT
SYR CONTROL 10ML LL (SYRINGE) IMPLANT
TRAY DSU PREP LF (CUSTOM PROCEDURE TRAY) ×2 IMPLANT
TUBE CONNECTING 20X1/4 (TUBING) ×2 IMPLANT
UNDERPAD 30X30 INCONTINENT (UNDERPADS AND DIAPERS) ×2 IMPLANT
WATER STERILE IRR 1000ML POUR (IV SOLUTION) ×2 IMPLANT

## 2011-03-08 NOTE — Anesthesia Preprocedure Evaluation (Signed)
Anesthesia Evaluation  Patient identified by MRN, date of birth, ID band Patient awake    Reviewed: Allergy & Precautions, H&P , NPO status , Patient's Chart, lab work & pertinent test results  Airway Mallampati: II      Dental   Pulmonary    Pulmonary exam normal       Cardiovascular hypertension,     Neuro/Psych    GI/Hepatic   Endo/Other  Diabetes mellitus-  Renal/GU      Musculoskeletal   Abdominal   Peds  Hematology   Anesthesia Other Findings   Reproductive/Obstetrics                           Anesthesia Physical Anesthesia Plan  ASA: III  Anesthesia Plan: General   Post-op Pain Management:    Induction: Intravenous  Airway Management Planned: LMA  Additional Equipment:   Intra-op Plan:   Post-operative Plan:   Informed Consent: I have reviewed the patients History and Physical, chart, labs and discussed the procedure including the risks, benefits and alternatives for the proposed anesthesia with the patient or authorized representative who has indicated his/her understanding and acceptance.     Plan Discussed with: CRNA and Surgeon  Anesthesia Plan Comments:         Anesthesia Quick Evaluation

## 2011-03-08 NOTE — Anesthesia Postprocedure Evaluation (Signed)
  Anesthesia Post-op Note  Patient: [name]  Procedure(s) Performed:  WRIST FUSION WITH ILIAC CREST BONE GRAFT - left wrist scaphoid excision and ulnar four bone fusion no illiac crest bone graft needed.  Patient Location: PACU  Anesthesia Type: GA combined with regional for post-op pain  Level of Consciousness: awake, alert  and oriented  Airway and Oxygen Therapy: Patient Spontanous Breathing  Post-op Pain: none  Post-op Assessment: Post-op Vital signs reviewed, Patient's Cardiovascular Status Stable, Respiratory Function Stable, Patent Airway, No signs of Nausea or vomiting and Pain level controlled  Post-op Vital Signs: stable  Complications: No apparent anesthesia complications

## 2011-03-08 NOTE — Transfer of Care (Signed)
Immediate Anesthesia Transfer of Care Note  Patient: [name]  Procedure(s) Performed:  WRIST FUSION WITH ILIAC CREST BONE GRAFT - left wrist scaphoid excision and ulnar four bone fusion no illiac crest bone graft needed.  Patient Location: PACU  Anesthesia Type: GA combined with regional for post-op pain    Level of Consciousness: awake and patient cooperative  Airway & Oxygen Therapy: Patient Spontanous Breathing and Patient connected to face mask oxygen  Post-op Assessment: Report given to PACU RN and Post -op Vital signs reviewed and stable  Post vital signs: Reviewed and stable  Complications: No apparent anesthesia complications

## 2011-03-08 NOTE — Brief Op Note (Signed)
03/08/2011  10:04 AM  PATIENT:  [name]  55 y.o. male  PRE-OPERATIVE DIAGNOSIS:  osteoarthrosis left wrist SLAC wrist radial styloid djd  POST-OPERATIVE DIAGNOSIS:  SLAC with radioscaphoid and midcarpal severe arthritis  PROCEDURE:  Procedure(s):SCAPHOID EXCISION, SYNOVECTOMY, RADIAL STYLOID EXCISION, POSTERIOR INTEROSSEOUS NEURECTOMY ULNAR FOUR BONE WRIST FUSION WITH AUTOGENOUS BONE GRAFT  SURGEON:  Surgeon(s): Wyn Forster., MD  PHYSICIAN ASSISTANT:   ASSISTANTS: Annye Rusk, P.A.-C  ANESTHESIA:   none  EBL:  Total I/O In: 1500 [I.V.:1500] Out: -   BLOOD ADMINISTERED:none  DRAINS: none   LOCAL MEDICATIONS USED:  NONE  SPECIMEN:  No Specimen  DISPOSITION OF SPECIMEN:  N/A  COUNTS:  YES  TOURNIQUET:  * Missing tourniquet times found for documented tourniquets in log:  8328 *  DICTATION: .Other Dictation: Dictation Number [id number]  PLAN OF CARE: Discharge to home after PACU  PATIENT DISPOSITION:  PACU - hemodynamically stable.

## 2011-03-08 NOTE — Op Note (Signed)
Op note dictated 03/08/11  MRN:  [id number] CSN:  [id number]

## 2011-03-08 NOTE — Anesthesia Procedure Notes (Addendum)
Anesthesia Regional Block:  Axillary brachial plexus block  Pre-Anesthetic Checklist: ,, timeout performed, Correct Patient, Correct Site, Correct Laterality, Correct Procedure, Correct Position, site marked, Risks and benefits discussed,  Surgical consent,  Pre-op evaluation,  At surgeon's request and post-op pain management  Laterality: Left     Needles:  Injection technique: Single-shot  Needle Type: Other     Needle Length: 2cm  Needle Gauge: 22 and 22 G    Additional Needles:  Procedures: transarterial technique Axillary brachial plexus block Narrative:   Performed by: Personally  Anesthesiologist: Dr Gypsy Balsam  Additional Notes: FBP Pop control transart tech w art stick x1 Multiple neg asp No conpl Dr Gypsy Balsam   Anesthesia Regional Block:   Narrative:  Start time: 03/08/2011 7:05 AM End time: 03/08/2011 7:12 AM   Procedure Name: LMA Insertion Date/Time: 03/08/2011 7:53 AM Performed by: Jearld Shines Pre-anesthesia Checklist: Patient identified, Timeout performed, Emergency Drugs available, Suction available and Patient being monitored Patient Re-evaluated:Patient Re-evaluated prior to inductionOxygen Delivery Method: Circle System Utilized Preoxygenation: Pre-oxygenation with 100% oxygen Intubation Type: IV induction Ventilation: Mask ventilation without difficulty LMA: LMA with gastric port inserted LMA Size: 5.0 Number of attempts: 1 Placement Confirmation: positive ETCO2 and breath sounds checked- equal and bilateral Tube secured with: Tape Dental Injury: Teeth and Oropharynx as per pre-operative assessment

## 2011-03-08 NOTE — Progress Notes (Signed)
Assisted Dr. Gypsy Balsam with left, axillary block. Side rails up, monitors on throughout procedure. See vital signs in flow sheet. Tolerated Procedure well.

## 2011-03-09 NOTE — Op Note (Signed)
NAME:  [name]                    ACCOUNT NO.:  MEDICAL RECORD NO.:  [id number]  LOCATION:                                 FACILITY:  PHYSICIAN:  Katy Fitch. Finesse Fielder, M.D.      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  OPERATION: 1. Left scaphoid resection and synovectomy left wrist. 2. Ulnar fore bone fusion utilizing autogenous scaphoid and radial     styloid bone graft with 16 mm mini Acutrak screw fixation of     capitolunate, and triquetrum hamate articulations. 3. Radial styloidectomy. 4. Posterior interosseous neurectomy for postoperative pain     management.  OPERATING SURGEON:  Katy Fitch. Adonys Wildes, MD  ASSISTANT:  Marveen Reeks Dasnoit, PA-C  ANESTHESIA:  General by LMA supplemented by a lidocaine and tetracaine plexus block.  SUPERVISING ANESTHESIOLOGIST:  Bedelia Person, MD  INDICATIONS:  [name] is a 55 year old gentleman referred through the courtesy of Dr. Charlett Blake for evaluation and management of severe and chronic left wrist pain.  His clinical examination revealed a mature SLAC wrist with a large radial styloid osteophyte and bone-on-bone arthropathy at the radioscaphoid and midcarpal capitolunate articulations.  He had a severe lunate DC collapse.  We had a detailed informed consent regarding choices to correct this predicament.  The main  choices for this would include a complete wrist fusion versus a scaphoid excision and ulnar fore bone fusion.  [name] wanted to preserve some motion if possible.  Therefore, we recommended proceeding with scaphoid excision and ulnar fore bone fusion with radial styloidectomy and posterior interosseous neurectomy for comfort.  After detailed informed consent in the office and repeat informed consent in the holding area, we proceed with surgery at this time.  Our goal of surgery is to decrease his wrist pain.  He understands he will lose some wrist motion, however, at the present time, he had less than 50% normal wrist motion.  The potential risk of the surgery include infection, failure of hardware, possible avascular necrosis of the carpal bones, and the need to proceed with a complete wrist fusion at a later date.  We initially planned on using a titanium compression screw device, however, during mid procedure, we realized that the device would be approximately 2 mm proud within the radiocarpal articulation, therefore, this was abandoned and we returned to traditional transosseous Acutrak screw fixation.  Preoperatively, [name] was reminded the potential risks and benefits of anesthesia by Dr. Gypsy Balsam, followed by placement of a lidocaine and tetracaine plexus block.  Excellent anesthesia was achieved.  PROCEDURE:  [name] was brought to room 2 of the [facility] and placed in supine position upon the operating table.  Following the  induction of general anesthesia by LMA technique, 2 g of Ancef was administered as an IV prophylactic antibiotic.  Under Dr. Burnett Corrente direct supervision, general anesthesia by LMA technique was induced followed by routine Betadine scrub and paint of the left upper extremity.  The arm was exsanguinated with Esmarch bandage, the arterial tourniquet inflated to 230 mmHg. Procedure commenced with a routine surgical time-out followed  by creation of a 6-cm longitudinal incision from the base of the long finger metacarpal just ulnar to Lister tubercle.  The fourth dorsal compartment was exposed, entered on its radial aspect, and the tendons retracted ulnarly.  A transverse capsulotomy was performed followed by piecemeal resection of the scaphoid, and complete synovectomy of the visible portions of the midcarpal, and radiocarpal, and ulnocarpal articulations.  We subsequently entered the midcarpal joint and removed all the hyaline cartilage on the adjacent surfaces of the triquetrum and hamate and residual cartilage on the capitate and lunate.  A 45 K-wire was placed as a joystick within the lunate and used to reduce the lunate to a neutral position.  The lunate was then secured to the radius with the 45 K-wire.  We subsequently began the process of placing a titanium ex-fix compression device, however, it quickly became clear that the device was going to be proud within the joint despite following proper protocol, therefore, we abandoned this process and default into traditionally use of Acutrak screws.  The carpus was reduced to a satisfactory position followed by provisional K-wire fixation, and 35 K-wire was used to target the alignment of 16-mm mini Acutrak screws.  These were placed with standard technique achieving excellent compression.  Residual bone graft was placed between the capitate, lunate, hamate, triquetrum, and the hamate and capitate.  Some bone graft was placed into the lunate at the site of the drill hole for screw placement.  The wound was thoroughly lavaged followed by identification of posterior interosseous nerve and resection of a 1 cm segment over the floor of the fourth dorsal compartment at the distal radius, and the radial styloid was exposed, and the distal centimeter of styloid resected with sharp osteotomes.  Care was taken to preserve the volar ligament origins.  After hemostasis was  achieved, the wound was lavaged with sterile saline followed by anatomic repair of the capsule with figure-of-eight sutures of 3-0 Ethibond, and repair of the retinaculum with figure-of-eight sutures of 3- 0 Ethibond knots buried.  The skin was repaired with subcutaneous 4-0 Vicryl and intradermal 3-0 Prolene.  Total tourniquet time was 1 hour and 58 minutes at 230 mmHg.  This was a technically demanding case ultimately completed with a very satisfactory result.     Katy Fitch Sontee Desena, M.D.     RVS/MEDQ  D:  03/08/2011  T:  03/08/2011  Job:  [id number]  cc:   Lunette Stands, M.D.

## 2011-03-20 NOTE — Addendum Note (Signed)
Addendum  created 03/20/11 0546 by Guillaume Weninger, MD   Modules edited:Anesthesia Blocks and Procedures, Inpatient Notes    

## 2011-03-20 NOTE — Addendum Note (Signed)
Addendum  created 03/20/11 0546 by Bedelia Person, MD   Modules edited:Anesthesia Blocks and Procedures, Inpatient Notes

## 2011-03-22 ENCOUNTER — Encounter (HOSPITAL_BASED_OUTPATIENT_CLINIC_OR_DEPARTMENT_OTHER): Payer: Self-pay

## 2011-05-12 ENCOUNTER — Other Ambulatory Visit: Payer: Self-pay | Admitting: Cardiovascular Disease

## 2011-05-21 ENCOUNTER — Encounter: Payer: Self-pay | Admitting: Cardiovascular Disease

## 2011-05-21 ENCOUNTER — Encounter: Payer: Self-pay | Admitting: *Deleted

## 2011-05-22 ENCOUNTER — Ambulatory Visit (INDEPENDENT_AMBULATORY_CARE_PROVIDER_SITE_OTHER): Payer: BC Managed Care – PPO | Admitting: Cardiovascular Disease

## 2011-05-22 ENCOUNTER — Encounter: Payer: Self-pay | Admitting: Cardiovascular Disease

## 2011-05-22 VITALS — BP 137/78 | HR 74 | Ht 70.0 in | Wt 258.0 lb

## 2011-05-22 DIAGNOSIS — I10 Essential (primary) hypertension: Secondary | ICD-10-CM

## 2011-05-22 MED ORDER — SIMVASTATIN 10 MG PO TABS: 10.0000 mg | ORAL_TABLET | Freq: Every day | ORAL | Status: DC

## 2011-05-22 MED ORDER — LOSARTAN POTASSIUM 100 MG PO TABS: 100.0000 mg | ORAL_TABLET | Freq: Every day | ORAL | Status: DC

## 2011-05-22 NOTE — Assessment & Plan Note (Signed)
Discussed low carb diet.  Target hemoglobin A1c is 6.5 or less.  Continue current medications.  

## 2011-05-22 NOTE — Assessment & Plan Note (Signed)
Cholesterol is at goal.  Continue current dose of statin and diet Rx.  No myalgias or side effects.  F/U  LFT's in 6 months. No results found for this basename: LDLCALC             

## 2011-05-22 NOTE — Assessment & Plan Note (Signed)
Continue rehab  F/U [facility].  Hopefully back to work soon

## 2011-05-22 NOTE — Patient Instructions (Signed)
Your physician wants you to follow-up in: YEAR WITH DR NISHAN  You will receive a reminder letter in the mail two months in advance. If you don't receive a letter, please call our office to schedule the follow-up appointment.  Your physician recommends that you continue on your current medications as directed. Please refer to the Current Medication list given to you today. 

## 2011-05-22 NOTE — Assessment & Plan Note (Signed)
Well controlled.  Continue current medications and low sodium Dash type diet.    

## 2011-05-22 NOTE — Progress Notes (Signed)
[name] is seen today in F/U of CRF's. 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 I reviewed his risk factor profile from [facility]. His HDL was low at 32. He was given samples of Niaspan by Dr Elana Alm and I told him he should try them He will take a coated aspirin 30 minutes prior to . His weight is still up and he needs to focus on a more low carb diet. He eats out a lot and has large portions. He has seen a nutrutionist and is working on this. He denies recurrent SSCP. He has lower back problems and complains about his legs aching in the morning. He has been compliant with his meds. He quit smoking in 1998. He is sedentary and still overweight with a BMI of 37.4. 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  All other systems reviewed and negative  General: Affect appropriate Overweight white male HEENT: normal Neck supple with no adenopathy JVP normal no bruits no thyromegaly Lungs clear with no wheezing and good diaphragmatic motion Heart:  S1/S2 no murmur, no rub, gallop or click PMI normal Abdomen: benighn, BS positve, no tenderness, no AAA no bruit.  No HSM or HJR Distal pulses intact with no bruits No edema Neuro non-focal Skin warm and dry New left hand surgery with decreased ROM and stiffness   Current Outpatient Prescriptions  Medication Sig Dispense Refill  . aspirin 81 MG chewable tablet Chew 81 mg by mouth daily.        [name] losartan (COZAAR) 100 MG tablet TAKE ONE-HALF (1/2) TABLET DAILY  90 tablet  2  . metFORMIN (GLUCOPHAGE) 1000 MG tablet Take 1,000 mg by mouth 2 (two) times daily  with a meal.        . simvastatin (ZOCOR) 10 MG tablet TAKE 1 TABLET DAILY AT BEDTIME  90 tablet  2    Allergies  Keflex and Shellfish allergy  Electrocardiogram:  NSR rate 74 LAD otherwise normal ECG  Assessment and Plan

## 2012-05-08 ENCOUNTER — Other Ambulatory Visit: Payer: Self-pay

## 2012-05-08 MED ORDER — LOSARTAN POTASSIUM 100 MG PO TABS: 100.0000 mg | ORAL_TABLET | Freq: Every day | ORAL | Status: DC

## 2012-06-10 ENCOUNTER — Ambulatory Visit: Payer: BC Managed Care – PPO | Admitting: Cardiovascular Disease

## 2012-06-19 LAB — HM DIABETES FOOT EXAM

## 2012-06-22 ENCOUNTER — Encounter: Payer: Self-pay | Admitting: Family Medicine

## 2012-07-04 ENCOUNTER — Ambulatory Visit: Payer: BC Managed Care – PPO | Admitting: Cardiovascular Disease

## 2012-07-17 ENCOUNTER — Ambulatory Visit: Payer: BC Managed Care – PPO | Admitting: Cardiovascular Disease

## 2012-07-21 ENCOUNTER — Ambulatory Visit: Payer: BC Managed Care – PPO | Admitting: Cardiovascular Disease

## 2012-08-07 ENCOUNTER — Encounter: Payer: Self-pay | Admitting: Cardiovascular Disease

## 2012-08-07 ENCOUNTER — Ambulatory Visit (INDEPENDENT_AMBULATORY_CARE_PROVIDER_SITE_OTHER): Payer: BC Managed Care – PPO | Admitting: Cardiovascular Disease

## 2012-08-07 VITALS — BP 134/80 | HR 78 | Wt 244.0 lb

## 2012-08-07 DIAGNOSIS — I10 Essential (primary) hypertension: Secondary | ICD-10-CM

## 2012-08-07 DIAGNOSIS — E785 Hyperlipidemia, unspecified: Secondary | ICD-10-CM

## 2012-08-07 DIAGNOSIS — E119 Type 2 diabetes mellitus without complications: Secondary | ICD-10-CM

## 2012-08-07 NOTE — Assessment & Plan Note (Signed)
Discussed low carb diet.  Target hemoglobin A1c is 6.5 or less.  Continue current medications.  

## 2012-08-07 NOTE — Patient Instructions (Addendum)
Your physician wants you to follow-up in: YEAR WITH DR NISHAN  You will receive a reminder letter in the mail two months in advance. If you don't receive a letter, please call our office to schedule the follow-up appointment.  Your physician recommends that you continue on your current medications as directed. Please refer to the Current Medication list given to you today. 

## 2012-08-07 NOTE — Assessment & Plan Note (Signed)
Cholesterol is at goal.  Continue current dose of statin and diet Rx.  No myalgias or side effects.  F/U  LFT's in 6 months. No results found for this basename: LDLCALC             

## 2012-08-07 NOTE — Assessment & Plan Note (Signed)
Well controlled.  Continue current medications and low sodium Dash type diet.    

## 2012-08-07 NOTE — Progress Notes (Signed)
Patient ID: [name], male   DOB: 1955/09/23, 57 y.o.   MRN: [id number] Carden is seen today in F/U of CRF's. 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 I reviewed his risk factor profile from [facility]. His HDL was low at 34. He was given samples of Niaspan by Dr Elana Alm and I told him he should try them He will take a coated aspirin 30 minutes prior to . His weight is still up and he needs to focus on a more low carb diet. He eats out a lot and has large portions. He has seen a nutrutionist and is working on this. He denies recurrent SSCP. He has lower back problems and complains about his legs aching in the morning. He has been compliant with his meds. He quit smoking in 1998. He is sedentary and still overweight with a BMI of 37.4. 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  All other systems reviewed and negative  General: Affect appropriate Obese white male HEENT: normal Neck supple with no adenopathy JVP normal no bruits no thyromegaly Lungs clear with no wheezing and good diaphragmatic motion Heart:  S1/S2 no murmur, no rub, gallop or click PMI normal Abdomen: benighn, BS positve, no tenderness, no AAA no bruit.  No HSM or HJR Distal pulses intact with no bruits No edema Neuro non-focal Skin warm and dry No muscular weakness   Current Outpatient Prescriptions  Medication Sig Dispense Refill  . aspirin 81 MG chewable tablet Chew 81 mg by mouth daily.        [name] etodolac (LODINE) 500 MG tablet Take 500 mg by mouth 2 (two) times daily.      [name] HYDROcodone-acetaminophen  (NORCO/VICODIN) 5-325 MG per tablet prn      . losartan (COZAAR) 100 MG tablet Take 50 mg by mouth daily.      . metFORMIN (GLUCOPHAGE) 1000 MG tablet Take 1,000 mg by mouth 2 (two) times daily with a meal.        . simvastatin (ZOCOR) 10 MG tablet Take 1 tablet (10 mg total) by mouth at bedtime.  90 tablet  3   No current facility-administered medications for this visit.    Allergies  Keflex and Shellfish allergy  Electrocardiogram:  SR rate 78 LAD otherwise normal  Assessment and Plan

## 2012-08-13 ENCOUNTER — Encounter: Payer: Self-pay | Admitting: *Deleted

## 2012-09-19 ENCOUNTER — Ambulatory Visit: Payer: Self-pay | Admitting: Family Medicine

## 2012-10-16 ENCOUNTER — Ambulatory Visit: Payer: Self-pay | Admitting: Family Medicine

## 2012-11-10 ENCOUNTER — Other Ambulatory Visit: Payer: Self-pay | Admitting: Nurse Practitioner

## 2012-12-04 ENCOUNTER — Ambulatory Visit: Payer: Self-pay | Admitting: Family Medicine

## 2012-12-26 ENCOUNTER — Encounter: Payer: Self-pay | Admitting: Family Medicine

## 2012-12-26 ENCOUNTER — Ambulatory Visit (INDEPENDENT_AMBULATORY_CARE_PROVIDER_SITE_OTHER): Payer: BC Managed Care – PPO | Admitting: Family Medicine

## 2012-12-26 VITALS — BP 123/75 | HR 65 | Temp 97.4°F | Ht 68.5 in | Wt 238.8 lb

## 2012-12-26 DIAGNOSIS — I10 Essential (primary) hypertension: Secondary | ICD-10-CM

## 2012-12-26 DIAGNOSIS — E785 Hyperlipidemia, unspecified: Secondary | ICD-10-CM

## 2012-12-26 DIAGNOSIS — IMO0002 Reserved for concepts with insufficient information to code with codable children: Secondary | ICD-10-CM | POA: Insufficient documentation

## 2012-12-26 DIAGNOSIS — IMO0001 Reserved for inherently not codable concepts without codable children: Secondary | ICD-10-CM

## 2012-12-26 DIAGNOSIS — M129 Arthropathy, unspecified: Secondary | ICD-10-CM

## 2012-12-26 DIAGNOSIS — R635 Abnormal weight gain: Secondary | ICD-10-CM

## 2012-12-26 DIAGNOSIS — E669 Obesity, unspecified: Secondary | ICD-10-CM | POA: Insufficient documentation

## 2012-12-26 DIAGNOSIS — Z9889 Other specified postprocedural states: Secondary | ICD-10-CM

## 2012-12-26 DIAGNOSIS — E1165 Type 2 diabetes mellitus with hyperglycemia: Secondary | ICD-10-CM

## 2012-12-26 MED ORDER — METFORMIN HCL 1000 MG PO TABS: 1000.0000 mg | ORAL_TABLET | Freq: Two times a day (BID) | ORAL | Status: DC

## 2012-12-26 NOTE — Progress Notes (Signed)
Patient ID: [name], male   DOB: 1955/06/27, 57 y.o.   MRN: [id number] SUBJECTIVE: CC: Chief Complaint  Patient presents with  . Follow-up    3 month   discuss labs wants written rx for metformin   . Hypertension    HPI: Patient is here for follow up of Diabetes Mellitus/HTN/HLD/arthritis: Symptoms evaluated: Denies Nocturia ,Denies Urinary Frequency , denies Blurred vision ,deniesDizziness,denies.Dysuria,denies paresthesias, denies extremity pain or ulcers.Marland Kitchendenies chest pain. has had an annual eye exam. do check the feet. Does check CBGs. Average CBG:110-130 Denies episodes of hypoglycemia. Does have an emergency hypoglycemic plan. admits toCompliance with medications. Denies Problems with medications.  Breakfast: an apple Lunch: sandwich most times it is tomato Supper:salad, anything  Past Medical History  Diagnosis Date  . Hypertension   . Diabetes mellitus   . Hyperlipidemia   . Arthritis     rheumatoid arthritis  . Vitamin D deficiency    Past Surgical History  Procedure Laterality Date  . Carpal tunnel release  2009    both hands  . Cardiac catheterization  2005    normal  . Tonsillectomy    . Knee arthroscopy      rt  . Scaphoid excision Left 2012    Dr Teressa Senter   History   Social History  . Marital Status: Married    Spouse Name: N/A    Number of Children: N/A  . Years of Education: N/A   Occupational History  . Not on file.   Social History Main Topics  . Smoking status: Former Smoker    Quit date: 03/05/1991  . Smokeless tobacco: Not on file  . Alcohol Use: No  . Drug Use: No  . Sexual Activity: Not on file   Other Topics Concern  . Not on file   Social History Narrative  . No narrative on file   Family History  Problem Relation Age of Onset  . Diabetes Father   . Hypertension Father    Current Outpatient Prescriptions on File Prior to Visit  Medication Sig Dispense Refill  . aspirin 81 MG chewable tablet Chew 81 mg by mouth  daily.        [name] EPIPEN 2-PAK 0.3 MG/0.3ML SOAJ injection USE AS DIRECTED AS NEEDED  2 Device  1  . HYDROcodone-acetaminophen (NORCO/VICODIN) 5-325 MG per tablet 1 tablet at bedtime as needed. prn      . losartan (COZAAR) 100 MG tablet Take 50 mg by mouth daily.      . simvastatin (ZOCOR) 10 MG tablet Take 1 tablet (10 mg total) by mouth at bedtime.  90 tablet  3   No current facility-administered medications on file prior to visit.   Allergies  Allergen Reactions  . Keflex [Cephalexin]     Pt does not tol-nervous  . Shellfish Allergy    Immunization History  Administered Date(s) Administered  . Pneumococcal Polysaccharide 09/23/2010  . Tdap 11/23/2010   Prior to Admission medications   Medication Sig Start Date End Date Taking? Authorizing Provider  aspirin 81 MG chewable tablet Chew 81 mg by mouth daily.     Yes Historical Provider, MD  EPIPEN 2-PAK 0.3 MG/0.3ML SOAJ injection USE AS DIRECTED AS NEEDED 11/10/12  Yes Ileana Ladd, MD  HYDROcodone-acetaminophen (NORCO/VICODIN) 5-325 MG per tablet 1 tablet at bedtime as needed. prn 05/16/12  Yes Historical Provider, MD  indomethacin (INDOCIN) 25 MG capsule Take 25 mg by mouth 3 (three) times daily with meals.   Yes Historical Provider, MD  InFLIXimab (REMICADE IV) Inject into the vein.   Yes Historical Provider, MD  losartan (COZAAR) 100 MG tablet Take 50 mg by mouth daily. 05/08/12  Yes Wendall Stade, MD  metFORMIN (GLUCOPHAGE) 1000 MG tablet Take 1 tablet (1,000 mg total) by mouth 2 (two) times daily with a meal. 12/26/12  Yes Ileana Ladd, MD  simvastatin (ZOCOR) 10 MG tablet Take 1 tablet (10 mg total) by mouth at bedtime. 05/22/11  Yes Wendall Stade, MD    ROS: As above in the HPI. All other systems are stable or negative.  OBJECTIVE: APPEARANCE:  Patient in no acute distress.The patient appeared well nourished and normally developed. Acyanotic. Waist: VITAL SIGNS:BP 123/75  Pulse 65  Temp(Src) 97.4 F (36.3 C) (Oral)  Ht  5' 8.5" (1.74 m)  Wt 238 lb 12.8 oz (108.319 kg)  BMI 35.78 kg/m2 WM Obese  SKIN: warm and  Dry without overt rashes, tattoos and scars  HEAD and Neck: without JVD, Head and scalp: normal Eyes:No scleral icterus. Fundi normal, eye movements normal. Ears: Auricle normal, canal normal, Tympanic membranes normal, insufflation normal. Nose: normal Throat: normal Neck & thyroid: normal  CHEST & LUNGS: Chest wall: normal Lungs: Clear  CVS: Reveals the PMI to be normally located. Regular rhythm, First and Second Heart sounds are normal,  absence of murmurs, rubs or gallops. Peripheral vasculature: Radial pulses: normal Dorsal pedis pulses: normal Posterior pulses: normal  ABDOMEN:  Appearance: obese Benign, no organomegaly, no masses, no Abdominal Aortic enlargement. No Guarding , no rebound. No Bruits. Bowel sounds: normal  RECTAL: N/A GU: N/A  EXTREMETIES: nonedematous.  MUSCULOSKELETAL:  Spine: normal Joints: tender joints of the fingers.and th ewrists.  NEUROLOGIC: oriented to time,place and person; nonfocal. Strength is normal Sensory is normal Reflexes are normal Cranial Nerves are normal.  ASSESSMENT: DM (diabetes mellitus), type 2, uncontrolled - Plan: metFORMIN (GLUCOPHAGE) 1000 MG tablet  ARTHRITIS - rheumatoid  HYPERLIPIDEMIA  HYPERTENSION  Obesity  CARPAL TUNNEL RELEASE, LEFT, HX OF   PLAN: Reviewed the labs from [facility]. The HGBA1C is >7 Lipids were not done.  Meds ordered this encounter  Medications  . DISCONTD: predniSONE (DELTASONE) 10 MG tablet    Sig:   . indomethacin (INDOCIN) 25 MG capsule    Sig: Take 25 mg by mouth 3 (three) times daily with meals.  . InFLIXimab (REMICADE IV)    Sig: Inject into the vein.  . metFORMIN (GLUCOPHAGE) 1000 MG tablet    Sig: Take 1 tablet (1,000 mg total) by mouth 2 (two) times daily with a meal.    Dispense:  180 tablet    Refill:  3         Dr Woodroe Mode Recommendations  For nutrition  information, I recommend books:  1).Eat to Live by Dr Monico Hoar. 2).Prevent and Reverse Heart Disease by Dr Suzzette Righter. 3) Dr Katherina Right Book:  Program to Reverse Diabetes  Exercise recommendations are:   If ambulatory, the patient can go for walks for 30 minutes 3 times a week. Then increase the intensity and duration as tolerated.  Goal is to try to attain exercise frequency to 5 times a week.  If applicable: Best to perform resistance exercises (machines or weights) 2 days a week and cardio type exercises 3 days per week.   Return in about 3 months (around 03/27/2013) for Recheck medical problems.with labs including lipids. Will give him the opportunity to get back on track with his diet and exercise otherwise he is  aware that we will need to add another DM medication such as Januvia.  Liliana Dang P. [place]esto Charon, M.D.

## 2013-02-02 ENCOUNTER — Encounter (INDEPENDENT_AMBULATORY_CARE_PROVIDER_SITE_OTHER): Payer: Self-pay

## 2013-02-02 ENCOUNTER — Encounter: Payer: Self-pay | Admitting: Family Medicine

## 2013-02-02 ENCOUNTER — Ambulatory Visit (INDEPENDENT_AMBULATORY_CARE_PROVIDER_SITE_OTHER): Payer: BC Managed Care – PPO | Admitting: Family Medicine

## 2013-02-02 VITALS — BP 114/79 | HR 93 | Temp 102.4°F | Ht 70.0 in | Wt 236.0 lb

## 2013-02-02 DIAGNOSIS — R509 Fever, unspecified: Secondary | ICD-10-CM | POA: Insufficient documentation

## 2013-02-02 DIAGNOSIS — E119 Type 2 diabetes mellitus without complications: Secondary | ICD-10-CM

## 2013-02-02 DIAGNOSIS — E785 Hyperlipidemia, unspecified: Secondary | ICD-10-CM

## 2013-02-02 DIAGNOSIS — R197 Diarrhea, unspecified: Secondary | ICD-10-CM | POA: Insufficient documentation

## 2013-02-02 DIAGNOSIS — I10 Essential (primary) hypertension: Secondary | ICD-10-CM

## 2013-02-02 DIAGNOSIS — E669 Obesity, unspecified: Secondary | ICD-10-CM

## 2013-02-02 LAB — POCT INFLUENZA A/B
Influenza A, POC: NEGATIVE
Influenza B, POC: NEGATIVE

## 2013-02-02 LAB — POCT UA - MICROSCOPIC ONLY
Bacteria, U Microscopic: NEGATIVE
Casts, Ur, LPF, POC: NEGATIVE
Crystals, Ur, HPF, POC: NEGATIVE
Mucus, UA: NEGATIVE
WBC, Ur, HPF, POC: NEGATIVE
Yeast, UA: NEGATIVE

## 2013-02-02 LAB — POCT CBC
Granulocyte percent: 67.9 %G (ref 37–80)
HCT, POC: 44.8 % (ref 43.5–53.7)
Hemoglobin: 15.3 g/dL (ref 14.1–18.1)
Lymph, poc: 2.4 (ref 0.6–3.4)
MCH, POC: 29.9 pg (ref 27–31.2)
MCHC: 34.1 g/dL (ref 31.8–35.4)
MCV: 87.8 fL (ref 80–97)
MPV: 6.5 fL (ref 0–99.8)
POC Granulocyte: 7 — AB (ref 2–6.9)
POC LYMPH PERCENT: 23.4 %L (ref 10–50)
Platelet Count, POC: 216 10*3/uL (ref 142–424)
RBC: 5.1 M/uL (ref 4.69–6.13)
RDW, POC: 12.3 %
WBC: 10.3 10*3/uL — AB (ref 4.6–10.2)

## 2013-02-02 LAB — POCT URINALYSIS DIPSTICK
Bilirubin, UA: NEGATIVE
Glucose, UA: NEGATIVE
Ketones, UA: NEGATIVE
Leukocytes, UA: NEGATIVE
Nitrite, UA: NEGATIVE
Protein, UA: 100
Spec Grav, UA: 1.01
Urobilinogen, UA: NEGATIVE
pH, UA: 5

## 2013-02-02 LAB — GLUCOSE, POCT (MANUAL RESULT ENTRY): POC Glucose: 123 mg/dl — AB (ref 70–99)

## 2013-02-02 NOTE — Patient Instructions (Signed)
Viral Gastroenteritis Viral gastroenteritis is also known as stomach flu. This condition affects the stomach and intestinal tract. It can cause sudden diarrhea and vomiting. The illness typically lasts 3 to 8 days. Most people develop an immune response that eventually gets rid of the virus. While this natural response develops, the virus can make you quite ill. CAUSES  Many different viruses can cause gastroenteritis, such as rotavirus or noroviruses. You can catch one of these viruses by consuming contaminated food or water. You may also catch a virus by sharing utensils or other personal items with an infected person or by touching a contaminated surface. SYMPTOMS  The most common symptoms are diarrhea and vomiting. These problems can cause a severe loss of body fluids (dehydration) and a body salt (electrolyte) imbalance. Other symptoms may include:  Fever.  Headache.  Fatigue.  Abdominal pain. DIAGNOSIS  Your caregiver can usually diagnose viral gastroenteritis based on your symptoms and a physical exam. A stool sample may also be taken to test for the presence of viruses or other infections. TREATMENT  This illness typically goes away on its own. Treatments are aimed at rehydration. The most serious cases of viral gastroenteritis involve vomiting so severely that you are not able to keep fluids down. In these cases, fluids must be given through an intravenous line (IV). HOME CARE INSTRUCTIONS   Drink enough fluids to keep your urine clear or pale yellow. Drink small amounts of fluids frequently and increase the amounts as tolerated.  Ask your caregiver for specific rehydration instructions.  Avoid:  Foods high in sugar.  Alcohol.  Carbonated drinks.  Tobacco.  Juice.  Caffeine drinks.  Extremely hot or cold fluids.  Fatty, greasy foods.  Too much intake of anything at one time.  Dairy products until 24 to 48 hours after diarrhea stops.  You may consume probiotics.  Probiotics are active cultures of beneficial bacteria. They may lessen the amount and number of diarrheal stools in adults. Probiotics can be found in yogurt with active cultures and in supplements.  Wash your hands well to avoid spreading the virus.  Only take over-the-counter or prescription medicines for pain, discomfort, or fever as directed by your caregiver. Do not give aspirin to children. Antidiarrheal medicines are not recommended.  Ask your caregiver if you should continue to take your regular prescribed and over-the-counter medicines.  Keep all follow-up appointments as directed by your caregiver. SEEK IMMEDIATE MEDICAL CARE IF:   You are unable to keep fluids down.  You do not urinate at least once every 6 to 8 hours.  You develop shortness of breath.  You notice blood in your stool or vomit. This may look like coffee grounds.  You have abdominal pain that increases or is concentrated in one small area (localized).  You have persistent vomiting or diarrhea.  You have a fever.  The patient is a child younger than 3 months, and he or she has a fever.  The patient is a child older than 3 months, and he or she has a fever and persistent symptoms.  The patient is a child older than 3 months, and he or she has a fever and symptoms suddenly get worse.  The patient is a baby, and he or she has no tears when crying. MAKE SURE YOU:   Understand these instructions.  Will watch your condition.  Will get help right away if you are not doing well or get worse. Document Released: 03/26/2005 Document Revised: 06/18/2011 Document Reviewed: 01/10/2011   [employer] Patient Information 2014 [employer]  

## 2013-02-02 NOTE — Progress Notes (Signed)
Patient ID: [name], male   DOB: 08/31/1955, 57 y.o.   MRN: [id number] SUBJECTIVE: CC: Chief Complaint  Patient presents with  . Fever  . Diarrhea  . Generalized Body Aches  . Chills    HPI: Dr Clovis Riley put him on a steroid  Doesepack. Still on it, on the last few doses. Has had fever chills.symptoms as above. Diarrhea .started 3 days ago and worse since yesterday. Was shaking and real high fever.occasional cough. No phlegm. No dysuria, having several lose watery stools.    Past Medical History  Diagnosis Date  . Hypertension   . Diabetes mellitus   . Hyperlipidemia   . Arthritis     rheumatoid arthritis  . Vitamin D deficiency    Past Surgical History  Procedure Laterality Date  . Carpal tunnel release  2009    both hands  . Cardiac catheterization  2005    normal  . Tonsillectomy    . Knee arthroscopy      rt  . Scaphoid excision Left 2012    Dr Teressa Senter   History   Social History  . Marital Status: Married    Spouse Name: N/A    Number of Children: N/A  . Years of Education: N/A   Occupational History  . Not on file.   Social History Main Topics  . Smoking status: Former Smoker    Quit date: 03/05/1991  . Smokeless tobacco: Not on file  . Alcohol Use: No  . Drug Use: No  . Sexual Activity: Not on file   Other Topics Concern  . Not on file   Social History Narrative  . No narrative on file   Family History  Problem Relation Age of Onset  . Diabetes Father   . Hypertension Father    Current Outpatient Prescriptions on File Prior to Visit  Medication Sig Dispense Refill  . aspirin 81 MG chewable tablet Chew 81 mg by mouth daily.        [name] EPIPEN 2-PAK 0.3 MG/0.3ML SOAJ injection USE AS DIRECTED AS NEEDED  2 Device  1  . HYDROcodone-acetaminophen (NORCO/VICODIN) 5-325 MG per tablet 1 tablet at bedtime as needed. prn      . indomethacin (INDOCIN) 25 MG capsule Take 25 mg by mouth 3 (three) times daily with meals.      . InFLIXimab (REMICADE  IV) Inject into the vein.      [name] losartan (COZAAR) 100 MG tablet Take 50 mg by mouth daily.      . metFORMIN (GLUCOPHAGE) 1000 MG tablet Take 1 tablet (1,000 mg total) by mouth 2 (two) times daily with a meal.  180 tablet  3  . simvastatin (ZOCOR) 10 MG tablet Take 1 tablet (10 mg total) by mouth at bedtime.  90 tablet  3   No current facility-administered medications on file prior to visit.   Allergies  Allergen Reactions  . Keflex [Cephalexin]     Pt does not tol-nervous  . Shellfish Allergy    Immunization History  Administered Date(s) Administered  . Pneumococcal Polysaccharide 09/23/2010  . Tdap 11/23/2010   Prior to Admission medications   Medication Sig Start Date End Date Taking? Authorizing Provider  aspirin 81 MG chewable tablet Chew 81 mg by mouth daily.     Yes Historical Provider, MD  EPIPEN 2-PAK 0.3 MG/0.3ML SOAJ injection USE AS DIRECTED AS NEEDED 11/10/12  Yes Ileana Ladd, MD  HYDROcodone-acetaminophen (NORCO/VICODIN) 5-325 MG per tablet 1 tablet at bedtime as needed.  prn 05/16/12  Yes Historical Provider, MD  indomethacin (INDOCIN) 25 MG capsule Take 25 mg by mouth 3 (three) times daily with meals.   Yes Historical Provider, MD  InFLIXimab (REMICADE IV) Inject into the vein.   Yes Historical Provider, MD  losartan (COZAAR) 100 MG tablet Take 50 mg by mouth daily. 05/08/12  Yes Wendall Stade, MD  metFORMIN (GLUCOPHAGE) 1000 MG tablet Take 1 tablet (1,000 mg total) by mouth 2 (two) times daily with a meal. 12/26/12  Yes Ileana Ladd, MD  simvastatin (ZOCOR) 10 MG tablet Take 1 tablet (10 mg total) by mouth at bedtime. 05/22/11  Yes Wendall Stade, MD     ROS: As above in the HPI. All other systems are stable or negative.  OBJECTIVE: APPEARANCE:  Patient in no acute distress.The patient appeared well nourished and normally developed. Acyanotic. Waist: VITAL SIGNS:BP 114/79  Pulse 93  Temp(Src) 102.4 F (39.1 C) (Oral)  Ht 5\' 10"  (1.778 m)  Wt 236 lb (107.049  kg)  BMI 33.86 kg/m2  Obese WM. Febrile Mild tilt positivity Weight 2 lbs in 1 month.  SKIN: febrile and  Dry  HEAD and Neck: without JVD, Head and scalp: normal Eyes:No scleral icterus. Fundi normal, eye movements normal. Ears: Auricle normal, canal normal, Tympanic membranes normal, insufflation normal. Nose: normal Throat: dry Neck & thyroid: normal  CHEST & LUNGS: Chest wall: normal Lungs: Clear  CVS: Reveals the PMI to be normally located. Regular rhythm, First and Second Heart sounds are normal,  absence of murmurs, rubs or gallops. Peripheral vasculature: Radial pulses: normal Dorsal pedis pulses: normal Posterior pulses: normal  ABDOMEN:  Appearance: obese Benign, no organomegaly, no masses, no Abdominal Aortic enlargement. No Guarding , no rebound. No Bruits. Bowel sounds: normal  RECTAL: N/A GU: N/A  EXTREMETIES: nonedematous.  MUSCULOSKELETAL:  Spine: normal Joints: intact  NEUROLOGIC: oriented to time,place and person; nonfocal. Strength is normal Sensory is normal Reflexes are normal Cranial Nerves are normal.  ASSESSMENT: Fever, unspecified - Plan: Influenza A/B, POCT CBC, POCT UA - Microscopic Only, POCT urinalysis dipstick, Urine culture  Diarrhea  DIABETES MELLITUS - Plan: POCT glucose (manual entry)  HYPERLIPIDEMIA  HYPERTENSION  Obesity Suspect Viral GE  PLAN:  Orders Placed This Encounter  Procedures  . Urine culture  . Influenza A/B  . POCT CBC  . POCT UA - Microscopic Only  . POCT urinalysis dipstick  . POCT glucose (manual entry)   Results for orders placed in visit on 02/02/13  POCT INFLUENZA A/B      Result Value Range   Influenza A, POC Negative     Influenza B, POC Negative    POCT CBC      Result Value Range   WBC 10.3 (*) 4.6 - 10.2 K/uL   Lymph, poc 2.4  0.6 - 3.4   POC LYMPH PERCENT 23.4  10 - 50 %L   POC Granulocyte 7.0 (*) 2 - 6.9   Granulocyte percent 67.9  37 - 80 %G   RBC 5.1  4.69 - 6.13 M/uL    Hemoglobin 15.3  14.1 - 18.1 g/dL   HCT, POC 96.0  45.4 - 53.7 %   MCV 87.8  80 - 97 fL   MCH, POC 29.9  27 - 31.2 pg   MCHC 34.1  31.8 - 35.4 g/dL   RDW, POC 09.8     Platelet Count, POC 216.0  142 - 424 K/uL   MPV 6.5  0 - 99.8 fL  POCT UA - MICROSCOPIC ONLY      Result Value Range   WBC, Ur, HPF, POC neg     RBC, urine, microscopic 1-5     Bacteria, U Microscopic neg     Mucus, UA neg     Epithelial cells, urine per micros occ     Crystals, Ur, HPF, POC neg     Casts, Ur, LPF, POC neg     Yeast, UA neg    POCT URINALYSIS DIPSTICK      Result Value Range   Color, UA amber     Clarity, UA clear     Glucose, UA neg     Bilirubin, UA neg     Ketones, UA neg     Spec Grav, UA 1.010     Blood, UA trace     pH, UA 5.0     Protein, UA 100     Urobilinogen, UA negative     Nitrite, UA neg     Leukocytes, UA Negative    GLUCOSE, POCT (MANUAL RESULT ENTRY)      Result Value Range   POC Glucose 123 (*) 70 - 99 mg/dl   Fluid oral rehydration discussed. OTC Imodium up to 8 per day. Tylenol for fever.  Risk of dehydration.  To Ed if worse. Recheck tomorrow and if not adequate  Rehydrated will consider IV. No orders of the defined types were placed in this encounter.   There are no discontinued medications. Return in about 1 day (around 02/03/2013) for Recheck medical problems. recheck hydration status.  Flynt Breeze P. Modesto Charon, M.D.

## 2013-02-03 ENCOUNTER — Encounter: Payer: Self-pay | Admitting: Family Medicine

## 2013-02-03 ENCOUNTER — Ambulatory Visit (INDEPENDENT_AMBULATORY_CARE_PROVIDER_SITE_OTHER): Payer: BC Managed Care – PPO | Admitting: Family Medicine

## 2013-02-03 VITALS — BP 123/79 | HR 78 | Temp 98.9°F | Wt 239.4 lb

## 2013-02-03 DIAGNOSIS — E860 Dehydration: Secondary | ICD-10-CM

## 2013-02-03 DIAGNOSIS — R197 Diarrhea, unspecified: Secondary | ICD-10-CM

## 2013-02-03 DIAGNOSIS — R509 Fever, unspecified: Secondary | ICD-10-CM

## 2013-02-03 LAB — URINE CULTURE

## 2013-02-03 NOTE — Progress Notes (Signed)
Patient ID: [name], male   DOB: 01/24/1956, 57 y.o.   MRN: [id number] SUBJECTIVE: CC: Chief Complaint  Patient presents with  . Follow-up    1 day reck feeling  better states weak , taking fluids well ate egg omelet yest and had M&M peanuts this am.     HPI: Fever resolved. 6 loose stools this morning., dizzy when he stands up. Feels thirst. Cannot drink enough. No abdominal pain.  Past Medical History  Diagnosis Date  . Hypertension   . Diabetes mellitus   . Hyperlipidemia   . Arthritis     rheumatoid arthritis  . Vitamin D deficiency    Past Surgical History  Procedure Laterality Date  . Carpal tunnel release  2009    both hands  . Cardiac catheterization  2005    normal  . Tonsillectomy    . Knee arthroscopy      rt  . Scaphoid excision Left 2012    Dr Teressa Senter   History   Social History  . Marital Status: Married    Spouse Name: N/A    Number of Children: N/A  . Years of Education: N/A   Occupational History  . Not on file.   Social History Main Topics  . Smoking status: Former Smoker    Quit date: 03/05/1991  . Smokeless tobacco: Not on file  . Alcohol Use: No  . Drug Use: No  . Sexual Activity: Not on file   Other Topics Concern  . Not on file   Social History Narrative  . No narrative on file   Family History  Problem Relation Age of Onset  . Diabetes Father   . Hypertension Father    Current Outpatient Prescriptions on File Prior to Visit  Medication Sig Dispense Refill  . aspirin 81 MG chewable tablet Chew 81 mg by mouth daily.        [name] EPIPEN 2-PAK 0.3 MG/0.3ML SOAJ injection USE AS DIRECTED AS NEEDED  2 Device  1  . HYDROcodone-acetaminophen (NORCO/VICODIN) 5-325 MG per tablet 1 tablet at bedtime as needed. prn      . indomethacin (INDOCIN) 25 MG capsule Take 25 mg by mouth 3 (three) times daily with meals.      . InFLIXimab (REMICADE IV) Inject into the vein.      [name] losartan (COZAAR) 100 MG tablet Take 50 mg by mouth daily.      .  metFORMIN (GLUCOPHAGE) 1000 MG tablet Take 1 tablet (1,000 mg total) by mouth 2 (two) times daily with a meal.  180 tablet  3  . simvastatin (ZOCOR) 10 MG tablet Take 1 tablet (10 mg total) by mouth at bedtime.  90 tablet  3   No current facility-administered medications on file prior to visit.   Allergies  Allergen Reactions  . Keflex [Cephalexin]     Pt does not tol-nervous  . Shellfish Allergy    Immunization History  Administered Date(s) Administered  . Pneumococcal Polysaccharide 09/23/2010  . Tdap 11/23/2010   Prior to Admission medications   Medication Sig Start Date End Date Taking? Authorizing Provider  aspirin 81 MG chewable tablet Chew 81 mg by mouth daily.     Yes Historical Provider, MD  EPIPEN 2-PAK 0.3 MG/0.3ML SOAJ injection USE AS DIRECTED AS NEEDED 11/10/12  Yes Ileana Ladd, MD  HYDROcodone-acetaminophen (NORCO/VICODIN) 5-325 MG per tablet 1 tablet at bedtime as needed. prn 05/16/12  Yes Historical Provider, MD  indomethacin (INDOCIN) 25 MG capsule Take 25  mg by mouth 3 (three) times daily with meals.   Yes Historical Provider, MD  InFLIXimab (REMICADE IV) Inject into the vein.   Yes Historical Provider, MD  losartan (COZAAR) 100 MG tablet Take 50 mg by mouth daily. 05/08/12  Yes Wendall Stade, MD  metFORMIN (GLUCOPHAGE) 1000 MG tablet Take 1 tablet (1,000 mg total) by mouth 2 (two) times daily with a meal. 12/26/12  Yes Ileana Ladd, MD  oxyCODONE-acetaminophen (PERCOCET) 10-325 MG per tablet  01/23/13  Yes Historical Provider, MD  simvastatin (ZOCOR) 10 MG tablet Take 1 tablet (10 mg total) by mouth at bedtime. 05/22/11  Yes Wendall Stade, MD     ROS: As above in the HPI. All other systems are stable or negative.  OBJECTIVE: APPEARANCE:  Patient in no acute distress.The patient appeared well nourished and normally developed. Acyanotic. Waist: VITAL SIGNS:BP 123/79  Pulse 78  Temp(Src) 98.9 F (37.2 C) (Oral)  Wt 239 lb 6.4 oz (108.591 kg)  BMI 34.35  kg/m2  BP 110/65, HR 110 on standing  WM tilt positive  SKIN: warm and  Dry without overt rashes, tattoos and scars  HEAD and Neck: without JVD, Head and scalp: normal Eyes:No scleral icterus. Fundi normal, eye movements normal. Ears: Auricle normal, canal normal, Tympanic membranes normal, insufflation normal. Nose: normal Throat: dry Neck & thyroid: normal  CHEST & LUNGS: Chest wall: normal Lungs: Clear  CVS: Reveals the PMI to be normally located. Regular rhythm, First and Second Heart sounds are normal,  absence of murmurs, rubs or gallops. Peripheral vasculature: Radial pulses: normal Dorsal pedis pulses: normal Posterior pulses: normal  ABDOMEN:  Appearance : Obese Benign, no organomegaly, no masses, no Abdominal Aortic enlargement. No Guarding , no rebound. No Bruits. Bowel sounds: increased  RECTAL: N/A GU: N/A  EXTREMETIES: nonedematous.  NEUROLOGIC: oriented to time,place and person; nonfocal. Cranial Nerves are normal.  ASSESSMENT: Fever, unspecified  Diarrhea  Dehydration  PLAN: Will embark on IV rehydration in the office. 1 Liter of N/S given. Patient felt better and was urinating. And BP stablilized. He was ready to go home. Advance diet and fluids. Continue with Imodium another 24 hours. RTc tomorrow if any sudden relapse. Consider stool cultures if persists. Patient was rehydrated and monitored by nurse and provider over 1 hour of service. No orders of the defined types were placed in this encounter.   Meds ordered this encounter  Medications  . oxyCODONE-acetaminophen (PERCOCET) 10-325 MG per tablet    Sig:    There are no discontinued medications. Return in about 2 days (around 02/05/2013) for Recheck medical problems, recheck BP.  Massiah Longanecker P. [place] Charon, M.D.

## 2013-02-04 NOTE — Progress Notes (Signed)
Quick Note:  Call patient.How is he doing? Labs normal.He was rehydrated with IV fluids.  No change in plan. ______

## 2013-03-27 ENCOUNTER — Ambulatory Visit: Payer: BC Managed Care – PPO | Admitting: Family Medicine

## 2013-04-17 ENCOUNTER — Ambulatory Visit: Payer: BC Managed Care – PPO | Admitting: Family Medicine

## 2013-04-22 ENCOUNTER — Other Ambulatory Visit: Payer: Self-pay | Admitting: Orthopedic Surgery

## 2013-04-22 DIAGNOSIS — M545 Low back pain, unspecified: Secondary | ICD-10-CM

## 2013-04-25 ENCOUNTER — Other Ambulatory Visit: Payer: BC Managed Care – PPO

## 2013-04-25 ENCOUNTER — Ambulatory Visit
Admission: RE | Admit: 2013-04-25 | Discharge: 2013-04-25 | Disposition: A | Discharge status: Home / Self Care | Payer: BC Managed Care – PPO | Source: Ambulatory Visit | Attending: Orthopedic Surgery | Admitting: Orthopedic Surgery

## 2013-04-25 DIAGNOSIS — M545 Low back pain, unspecified: Secondary | ICD-10-CM

## 2013-05-29 ENCOUNTER — Ambulatory Visit (INDEPENDENT_AMBULATORY_CARE_PROVIDER_SITE_OTHER): Payer: BC Managed Care – PPO | Admitting: Family Medicine

## 2013-05-29 ENCOUNTER — Encounter: Payer: Self-pay | Admitting: Family Medicine

## 2013-05-29 VITALS — BP 163/77 | HR 70 | Temp 97.1°F | Ht 68.0 in | Wt 252.2 lb

## 2013-05-29 DIAGNOSIS — E119 Type 2 diabetes mellitus without complications: Secondary | ICD-10-CM

## 2013-05-29 DIAGNOSIS — J310 Chronic rhinitis: Secondary | ICD-10-CM | POA: Insufficient documentation

## 2013-05-29 DIAGNOSIS — M129 Arthropathy, unspecified: Secondary | ICD-10-CM

## 2013-05-29 DIAGNOSIS — E669 Obesity, unspecified: Secondary | ICD-10-CM

## 2013-05-29 DIAGNOSIS — E785 Hyperlipidemia, unspecified: Secondary | ICD-10-CM

## 2013-05-29 DIAGNOSIS — I10 Essential (primary) hypertension: Secondary | ICD-10-CM

## 2013-05-29 MED ORDER — IPRATROPIUM BROMIDE 0.06 % NA SOLN: 2.0000 | Freq: Three times a day (TID) | NASAL | Status: DC

## 2013-05-29 NOTE — Patient Instructions (Signed)
      Dr Other Atienza's Recommendations  For nutrition information, I recommend books:  1).Eat to Live by Dr Joel Fuhrman. 2).Prevent and Reverse Heart Disease by Dr Caldwell Esselstyn. 3) Dr Neal Barnard's Book:  Program to Reverse Diabetes 4) The [place] Study by T Colin Campbell  Exercise recommendations are:  If unable to walk, then the patient can exercise in a chair 3 times a day. By flapping arms like a bird gently and raising legs outwards to the front.  If ambulatory, the patient can go for walks for 30 minutes 3 times a week. Then increase the intensity and duration as tolerated.  Goal is to try to attain exercise frequency to 5 times a week.  If applicable: Best to perform resistance exercises (machines or weights) 2 days a week and cardio type exercises 3 days per week.  

## 2013-05-29 NOTE — Progress Notes (Signed)
Patient ID: [name], male   DOB: 1955-07-13, 58 y.o.   MRN: [id number] SUBJECTIVE: CC: Chief Complaint  Patient presents with  . Follow-up    3 month follow up had arentza infusion this am .     HPI: Patient is here for follow up of Diabetes Mellitus/RH arthritis/Obesity/HTN/HLD: Symptoms evaluated: Denies Nocturia ,Denies Urinary Frequency , denies Blurred vision ,deniesDizziness,denies.Dysuria,denies paresthesias, denies extremity pain or ulcers.Marland Kitchendenies chest pain. has had an annual eye exam. do check the feet. Does check CBGs. Average WNI:[id number] when he gets RA treatment Denies episodes of hypoglycemia. Does have an emergency hypoglycemic plan. admits toCompliance with medications. Denies Problems with medications.  BP was fine this morning then after the orencia treatment his SBP went up to the 140s. Now it is high. His nose is always stuffy for months since orencia as a side effect.  Past Medical History  Diagnosis Date  . Hypertension   . Diabetes mellitus   . Hyperlipidemia   . Arthritis     rheumatoid arthritis  . Vitamin D deficiency    Past Surgical History  Procedure Laterality Date  . Carpal tunnel release  2009    both hands  . Cardiac catheterization  2005    normal  . Tonsillectomy    . Knee arthroscopy      rt  . Scaphoid excision Left 2012    Dr Teressa Senter   History   Social History  . Marital Status: Married    Spouse Name: N/A    Number of Children: N/A  . Years of Education: N/A   Occupational History  . Not on file.   Social History Main Topics  . Smoking status: Former Smoker    Quit date: 03/05/1991  . Smokeless tobacco: Not on file  . Alcohol Use: No  . Drug Use: No  . Sexual Activity: Not on file   Other Topics Concern  . Not on file   Social History Narrative  . No narrative on file   Family History  Problem Relation Age of Onset  . Diabetes Father   . Hypertension Father    Current Outpatient Prescriptions on  File Prior to Visit  Medication Sig Dispense Refill  . oxyCODONE-acetaminophen (PERCOCET) 10-325 MG per tablet       . aspirin 81 MG chewable tablet Chew 81 mg by mouth daily.        [name] EPIPEN 2-PAK 0.3 MG/0.3ML SOAJ injection USE AS DIRECTED AS NEEDED  2 Device  1  . HYDROcodone-acetaminophen (NORCO/VICODIN) 5-325 MG per tablet 1 tablet at bedtime as needed. prn      . indomethacin (INDOCIN) 25 MG capsule Take 25 mg by mouth 3 (three) times daily with meals.      [name] losartan (COZAAR) 100 MG tablet Take 50 mg by mouth daily.      . metFORMIN (GLUCOPHAGE) 1000 MG tablet Take 1 tablet (1,000 mg total) by mouth 2 (two) times daily with a meal.  180 tablet  3  . simvastatin (ZOCOR) 10 MG tablet Take 1 tablet (10 mg total) by mouth at bedtime.  90 tablet  3   No current facility-administered medications on file prior to visit.   Allergies  Allergen Reactions  . Keflex [Cephalexin]     Pt does not tol-nervous  . Shellfish Allergy    Immunization History  Administered Date(s) Administered  . Pneumococcal Polysaccharide-23 09/23/2010  . Tdap 11/23/2010   Prior to Admission medications   Medication Sig Start Date End  Date Taking? Authorizing Provider  Abatacept (ORENCIA IV) Inject into the vein. Per Dr Tobie Lords   Yes Historical Provider, MD  oxyCODONE-acetaminophen [facility]) 10-325 MG per tablet  01/23/13  Yes Historical Provider, MD  aspirin 81 MG chewable tablet Chew 81 mg by mouth daily.      Historical Provider, MD  EPIPEN 2-PAK 0.3 MG/0.3ML SOAJ injection USE AS DIRECTED AS NEEDED 11/10/12   Vernie Shanks, MD  HYDROcodone-acetaminophen (NORCO/VICODIN) 5-325 MG per tablet 1 tablet at bedtime as needed. prn 05/16/12   Historical Provider, MD  indomethacin (INDOCIN) 25 MG capsule Take 25 mg by mouth 3 (three) times daily with meals.    Historical Provider, MD  losartan (COZAAR) 100 MG tablet Take 50 mg by mouth daily. 05/08/12   Josue Hector, MD  metFORMIN (GLUCOPHAGE) 1000 MG tablet  Take 1 tablet (1,000 mg total) by mouth 2 (two) times daily with a meal. 12/26/12   Vernie Shanks, MD  simvastatin (ZOCOR) 10 MG tablet Take 1 tablet (10 mg total) by mouth at bedtime. 05/22/11   Josue Hector, MD     ROS: As above in the HPI. All other systems are stable or negative.  OBJECTIVE: APPEARANCE:  Patient in no acute distress.The patient appeared well nourished and normally developed. Acyanotic. Waist: VITAL SIGNS:BP 163/77  Pulse 70  Temp(Src) 97.1 F (36.2 C) (Oral)  Ht 5\' 8"  (1.727 m)  Wt 252 lb 3.2 oz (114.397 kg)  BMI 38.36 kg/m2  Obese WM  SKIN: warm and  Dry without overt rashes, tattoos and scars  HEAD and Neck: without JVD, Head and scalp: normal Eyes:No scleral icterus. Fundi normal, eye movements normal. Ears: Auricle normal, canal normal, Tympanic membranes normal, insufflation normal. Nose: normal Throat: normal Neck & thyroid: normal  CHEST & LUNGS: Chest wall: normal Lungs: Clear  CVS: Reveals the PMI to be normally located. Regular rhythm, First and Second Heart sounds are normal,  absence of murmurs, rubs or gallops. Peripheral vasculature: Radial pulses: normal Dorsal pedis pulses: normal Posterior pulses: normal  ABDOMEN:  Appearance: Obese Benign, no organomegaly, no masses, no Abdominal Aortic enlargement. No Guarding , no rebound. No Bruits. Bowel sounds: normal  RECTAL: N/A GU: N/A  EXTREMETIES: nonedematous.  MUSCULOSKELETAL:  Spine: normal Joints: intact  NEUROLOGIC: oriented to time,place and person; nonfocal. Strength is normal Sensory is normal Reflexes are normal Cranial Nerves are normal.   Database Labs 04/14/2013 HGBA1C 7.0  See scanned copy.   ASSESSMENT:  DIABETES MELLITUS  Obesity  HYPERTENSION  HYPERLIPIDEMIA  ARTHRITIS  Perennial non-allergic rhinitis DM not at goal.    PLAN:      Dr Paula Libra Recommendations  For nutrition information, I recommend books:  1).Eat to  Live by Dr Excell Seltzer. 2).Prevent and Reverse Heart Disease by Dr Karl Luke. 3) Dr Janene Harvey Book:  Program to Reverse Diabetes  Exercise recommendations are:  If unable to walk, then the patient can exercise in a chair 3 times a day. By flapping arms like a bird gently and raising legs outwards to the front.  If ambulatory, the patient can go for walks for 30 minutes 3 times a week. Then increase the intensity and duration as tolerated.  Goal is to try to attain exercise frequency to 5 times a week.  If applicable: Best to perform resistance exercises (machines or weights) 2 days a week and cardio type exercises 3 days per week.   Discussed risks and benefits of the Orencia. He has  RA but the sick feeling after infusion and nasal congestion is irritating to him. Also, the chronic arthritic pain on the other hand is relieved somewhat from the treatment.  No orders of the defined types were placed in this encounter.   Meds ordered this encounter  Medications  . Abatacept (ORENCIA IV)    Sig: Inject into the vein. Per Dr Tobie Lords  . ipratropium (ATROVENT) 0.06 % nasal spray    Sig: Place 2 sprays into both nostrils 3 (three) times daily.    Dispense:  15 mL    Refill:  12   Medications Discontinued During This Encounter  Medication Reason  . InFLIXimab (REMICADE IV) Change in therapy   Return in about 3 months (around 08/26/2013) for Recheck medical problems.  Gabrielle Wakeland P. Jacelyn Grip, M.D.

## 2013-06-01 ENCOUNTER — Telehealth: Payer: Self-pay | Admitting: Family Medicine

## 2013-06-05 ENCOUNTER — Other Ambulatory Visit: Payer: Self-pay | Admitting: Family Medicine

## 2013-06-05 MED ORDER — AMOXICILLIN 875 MG PO TABS: 875.0000 mg | ORAL_TABLET | Freq: Two times a day (BID) | ORAL | Status: DC

## 2013-06-05 NOTE — Telephone Encounter (Signed)
Pt.notified

## 2013-06-05 NOTE — Telephone Encounter (Signed)
Call patient : Prescription for amoxil sent to pharmacy in [place].

## 2013-09-21 ENCOUNTER — Encounter: Payer: Self-pay | Admitting: Family

## 2013-09-21 ENCOUNTER — Ambulatory Visit (INDEPENDENT_AMBULATORY_CARE_PROVIDER_SITE_OTHER): Payer: BC Managed Care – PPO | Admitting: Family

## 2013-09-21 ENCOUNTER — Ambulatory Visit (INDEPENDENT_AMBULATORY_CARE_PROVIDER_SITE_OTHER): Payer: BC Managed Care – PPO

## 2013-09-21 VITALS — BP 133/84 | HR 84 | Temp 98.2°F | Ht 68.0 in | Wt 248.2 lb

## 2013-09-21 DIAGNOSIS — R059 Cough, unspecified: Secondary | ICD-10-CM

## 2013-09-21 DIAGNOSIS — R05 Cough: Secondary | ICD-10-CM

## 2013-09-21 DIAGNOSIS — J069 Acute upper respiratory infection, unspecified: Secondary | ICD-10-CM

## 2013-09-21 MED ORDER — BENZONATATE 100 MG PO CAPS: 100.0000 mg | ORAL_CAPSULE | Freq: Three times a day (TID) | ORAL | Status: DC | PRN

## 2013-09-21 MED ORDER — AMOXICILLIN 500 MG PO CAPS: 500.0000 mg | ORAL_CAPSULE | Freq: Two times a day (BID) | ORAL | Status: DC

## 2013-09-21 NOTE — Progress Notes (Signed)
Subjective:    Patient ID: [name], male    DOB: 13-Sep-1955, 58 y.o.   MRN: [id number]  Cough This is a new problem. The current episode started in the past 7 days (Wednesday). The problem has been gradually worsening. The problem occurs hourly. The cough is non-productive. Associated symptoms include chills, rhinorrhea, shortness of breath and wheezing. Pertinent negatives include no ear congestion, ear pain, fever, headaches, nasal congestion, postnasal drip or sore throat. The symptoms are aggravated by lying down. He has tried nothing for the symptoms. There is no history of asthma or COPD.      Review of Systems  Constitutional: Positive for chills. Negative for fever.  HENT: Positive for rhinorrhea. Negative for ear pain, postnasal drip and sore throat.   Respiratory: Positive for cough, shortness of breath and wheezing.   Gastrointestinal: Negative.   Genitourinary: Negative.   Musculoskeletal: Negative.   Neurological: Negative for headaches.  All other systems reviewed and are negative.      Objective:   Physical Exam  Vitals reviewed. Constitutional: He is oriented to person, place, and time. He appears well-developed and well-nourished. No distress.  HENT:  Head: Normocephalic.  Right Ear: External ear normal.  Left Ear: External ear normal.  Bilateral nasal passage with mild erythemas and swelling and oropharynx with mild erythemas  Eyes: Pupils are equal, round, and reactive to light. Right eye exhibits no discharge. Left eye exhibits no discharge.  Neck: Normal range of motion. Neck supple. No thyromegaly present.  Cardiovascular: Normal rate, regular rhythm, normal heart sounds and intact distal pulses.   No murmur heard. Pulmonary/Chest: Effort normal and breath sounds normal. No respiratory distress. He has no wheezes.  Musculoskeletal: Normal range of motion. He exhibits no edema and no tenderness.  Neurological: He is alert and oriented to person,  place, and time. He has normal reflexes. No cranial nerve deficit.  Skin: Skin is warm and dry. No rash noted. No erythema.  Psychiatric: He has a normal mood and affect. His behavior is normal. Judgment and thought content normal.      BP 133/84  Pulse 84  Temp(Src) 98.2 F (36.8 C) (Oral)  Ht 5\' 8"  (1.727 m)  Wt 248 lb 3.2 oz (112.583 kg)  BMI 37.75 kg/m2     Assessment & Plan:  1. Cough - DG Chest 2 View; Future - amoxicillin (AMOXIL) 500 MG capsule; Take 1 capsule (500 mg total) by mouth 2 (two) times daily.  Dispense: 10 capsule; Refill: 0 - benzonatate (TESSALON) 100 MG capsule; Take 1 capsule (100 mg total) by mouth 3 (three) times daily as needed for cough.  Dispense: 20 capsule; Refill: 0  2. Upper respiratory infection -- Take meds as prescribed - Use a cool mist humidifier  -Use saline nose sprays frequently -Saline irrigations of the nose can be very helpful if done frequently.  * 4X daily for 1 week*  * Use of a nettie pot can be helpful with this. Follow directions with this* -Force fluids -For any cough or congestion  Use plain Mucinex- regular strength or max strength is fine   * Children- consult with Pharmacist for dosing -For fever or aces or pains- take tylenol or ibuprofen appropriate for age and weight.  * for fevers greater than 101 orally you may alternate ibuprofen and tylenol every  3 hours. -Throat lozenges if help - amoxicillin (AMOXIL) 500 MG capsule; Take 1 capsule (500 mg total) by mouth 2 (two) times daily.  Dispense:  10 capsule; Refill: 0  Evelina Dun, FNP

## 2013-09-21 NOTE — Patient Instructions (Addendum)
Upper Respiratory Infection, Adult An upper respiratory infection (URI) is also sometimes known as the common cold. The upper respiratory tract includes the nose, sinuses, throat, trachea, and bronchi. Bronchi are the airways leading to the lungs. Most people improve within 1 week, but symptoms can last up to 2 weeks. A residual cough may last even longer.  CAUSES Many different viruses can infect the tissues lining the upper respiratory tract. The tissues become irritated and inflamed and often become very moist. Mucus production is also common. A cold is contagious. You can easily spread the virus to others by oral contact. This includes kissing, sharing a glass, coughing, or sneezing. Touching your mouth or nose and then touching a surface, which is then touched by another person, can also spread the virus. SYMPTOMS  Symptoms typically develop 1 to 3 days after you come in contact with a cold virus. Symptoms vary from person to person. They may include:  Runny nose.  Sneezing.  Nasal congestion.  Sinus irritation.  Sore throat.  Loss of voice (laryngitis).  Cough.  Fatigue.  Muscle aches.  Loss of appetite.  Headache.  Low-grade fever. DIAGNOSIS  You might diagnose your own cold based on familiar symptoms, since most people get a cold 2 to 3 times a year. Your caregiver can confirm this based on your exam. Most importantly, your caregiver can check that your symptoms are not due to another disease such as strep throat, sinusitis, pneumonia, asthma, or epiglottitis. Blood tests, throat tests, and X-rays are not necessary to diagnose a common cold, but they may sometimes be helpful in excluding other more serious diseases. Your caregiver will decide if any further tests are required. RISKS AND COMPLICATIONS  You may be at risk for a more severe case of the common cold if you smoke cigarettes, have chronic heart disease (such as heart failure) or lung disease (such as asthma), or if  you have a weakened immune system. The very young and very old are also at risk for more serious infections. Bacterial sinusitis, middle ear infections, and bacterial pneumonia can complicate the common cold. The common cold can worsen asthma and chronic obstructive pulmonary disease (COPD). Sometimes, these complications can require emergency medical care and may be life-threatening. PREVENTION  The best way to protect against getting a cold is to practice good hygiene. Avoid oral or hand contact with people with cold symptoms. Wash your hands often if contact occurs. There is no clear evidence that vitamin C, vitamin E, echinacea, or exercise reduces the chance of developing a cold. However, it is always recommended to get plenty of rest and practice good nutrition. TREATMENT  Treatment is directed at relieving symptoms. There is no cure. Antibiotics are not effective, because the infection is caused by a virus, not by bacteria. Treatment may include:  Increased fluid intake. Sports drinks offer valuable electrolytes, sugars, and fluids.  Breathing heated mist or steam (vaporizer or shower).  Eating chicken soup or other clear broths, and maintaining good nutrition.  Getting plenty of rest.  Using gargles or lozenges for comfort.  Controlling fevers with ibuprofen or acetaminophen as directed by your caregiver.  Increasing usage of your inhaler if you have asthma. Zinc gel and zinc lozenges, taken in the first 24 hours of the common cold, can shorten the duration and lessen the severity of symptoms. Pain medicines may help with fever, muscle aches, and throat pain. A variety of non-prescription medicines are available to treat congestion and runny nose. Your caregiver   can make recommendations and may suggest nasal or lung inhalers for other symptoms.  HOME CARE INSTRUCTIONS   Only take over-the-counter or prescription medicines for pain, discomfort, or fever as directed by your  caregiver.  Use a warm mist humidifier or inhale steam from a shower to increase air moisture. This may keep secretions moist and make it easier to breathe.  Drink enough water and fluids to keep your urine clear or pale yellow.  Rest as needed.  Return to work when your temperature has returned to normal or as your caregiver advises. You may need to stay home longer to avoid infecting others. You can also use a face mask and careful hand washing to prevent spread of the virus. SEEK MEDICAL CARE IF:   After the first few days, you feel you are getting worse rather than better.  You need your caregiver's advice about medicines to control symptoms.  You develop chills, worsening shortness of breath, or brown or red sputum. These may be signs of pneumonia.  You develop yellow or brown nasal discharge or pain in the face, especially when you bend forward. These may be signs of sinusitis.  You develop a fever, swollen neck glands, pain with swallowing, or white areas in the back of your throat. These may be signs of strep throat. SEEK IMMEDIATE MEDICAL CARE IF:   You have a fever.  You develop severe or persistent headache, ear pain, sinus pain, or chest pain.  You develop wheezing, a prolonged cough, cough up blood, or have a change in your usual mucus (if you have chronic lung disease).  You develop sore muscles or a stiff neck. Document Released: 09/19/2000 Document Revised: 06/18/2011 Document Reviewed: 07/28/2010 Exit[employer] Patient Information 2014 [place], [place].  - Take meds as prescribed - Use a cool mist humidifier  -Use saline nose sprays frequently -Saline irrigations of the nose can be very helpful if done frequently.  * 4X daily for 1 week*  * Use of a nettie pot can be helpful with this. Follow directions with this* -Force fluids -For any cough or congestion  Use plain Mucinex- regular strength or max strength is fine   * Children- consult with Pharmacist for  dosing -For fever or aces or pains- take tylenol or ibuprofen appropriate for age and weight.  * for fevers greater than 101 orally you may alternate ibuprofen and tylenol every  3 hours. -Throat lozenges if help  Evelina Dun, FNP

## 2013-09-22 ENCOUNTER — Other Ambulatory Visit: Payer: Self-pay | Admitting: Family

## 2013-09-22 DIAGNOSIS — R911 Solitary pulmonary nodule: Secondary | ICD-10-CM

## 2013-10-12 ENCOUNTER — Ambulatory Visit: Payer: BC Managed Care – PPO | Admitting: Family

## 2013-10-28 ENCOUNTER — Encounter: Payer: Self-pay | Admitting: Family

## 2013-10-28 ENCOUNTER — Ambulatory Visit (INDEPENDENT_AMBULATORY_CARE_PROVIDER_SITE_OTHER): Payer: BC Managed Care – PPO | Admitting: Family

## 2013-10-28 VITALS — BP 126/74 | HR 85 | Temp 99.1°F | Ht 68.0 in | Wt 248.4 lb

## 2013-10-28 DIAGNOSIS — Z125 Encounter for screening for malignant neoplasm of prostate: Secondary | ICD-10-CM

## 2013-10-28 DIAGNOSIS — E119 Type 2 diabetes mellitus without complications: Secondary | ICD-10-CM

## 2013-10-28 DIAGNOSIS — Z1321 Encounter for screening for nutritional disorder: Secondary | ICD-10-CM

## 2013-10-28 DIAGNOSIS — I10 Essential (primary) hypertension: Secondary | ICD-10-CM

## 2013-10-28 DIAGNOSIS — M069 Rheumatoid arthritis, unspecified: Secondary | ICD-10-CM

## 2013-10-28 DIAGNOSIS — E785 Hyperlipidemia, unspecified: Secondary | ICD-10-CM

## 2013-10-28 LAB — POCT GLYCOSYLATED HEMOGLOBIN (HGB A1C): HEMOGLOBIN A1C: 7.7

## 2013-10-28 LAB — POCT UA - MICROALBUMIN: Microalbumin Ur, POC: 50 mg/L

## 2013-10-28 MED ORDER — CANAGLIFLOZIN 100 MG PO TABS: 100.0000 mg | ORAL_TABLET | Freq: Every day | ORAL | Status: DC

## 2013-10-28 NOTE — Addendum Note (Signed)
Addended by: Wyline Mood on: 10/28/2013 05:39 PM   Modules accepted: Orders

## 2013-10-28 NOTE — Progress Notes (Signed)
Subjective:    Patient ID: [name], male    DOB: [birth year], 58 y.o.   MRN: [id number]  Diabetes He presents for his follow-up diabetic visit. He has type 2 diabetes mellitus. His disease course has been improving. Pertinent negatives for hypoglycemia include no confusion, dizziness or headaches. Pertinent negatives for diabetes include no blurred vision, no foot paresthesias, no foot ulcerations and no visual change. Pertinent negatives for hypoglycemia complications include no blackouts. Symptoms are worsening. Pertinent negatives for diabetic complications include no CVA, heart disease, nephropathy or peripheral neuropathy. Risk factors for coronary artery disease include diabetes mellitus, dyslipidemia, hypertension, male sex and family history. Current diabetic treatment includes oral agent (monotherapy). He is compliant with treatment all of the time. He is following a generally healthy diet. His breakfast blood glucose range is generally 140-180 mg/dl. An ACE inhibitor/angiotensin II receptor blocker is being taken. Eye exam is current.  Hyperlipidemia This is a chronic problem. The current episode started more than 1 year ago. The problem is uncontrolled. Recent lipid tests were reviewed and are high. Exacerbating diseases include diabetes. He has no history of hypothyroidism. Factors aggravating his hyperlipidemia include fatty foods. Associated symptoms include leg pain. Pertinent negatives include no myalgias. Current antihyperlipidemic treatment includes statins. The current treatment provides moderate improvement of lipids. Risk factors for coronary artery disease include diabetes mellitus, dyslipidemia, family history, male sex and obesity.  Hypertension This is a chronic problem. The current episode started more than 1 year ago. The problem has been resolved since onset. The problem is controlled. Pertinent negatives include no blurred vision, headaches, palpitations or peripheral  edema. Risk factors for coronary artery disease include diabetes mellitus, dyslipidemia, family history and male gender. Past treatments include angiotensin blockers. The current treatment provides mild improvement. There is no history of kidney disease, CAD/MI, CVA, heart failure or a thyroid problem. There is no history of sleep apnea.      Review of Systems  Constitutional: Negative.   HENT: Negative.   Eyes: Negative for blurred vision.  Respiratory: Negative.   Cardiovascular: Negative.  Negative for palpitations.  Gastrointestinal: Negative.   Endocrine: Negative.   Genitourinary: Negative.   Musculoskeletal: Positive for arthralgias and back pain. Negative for myalgias.  Neurological: Negative.  Negative for dizziness and headaches.  Hematological: Negative.   Psychiatric/Behavioral: Negative.  Negative for confusion.  All other systems reviewed and are negative.      Objective:   Physical Exam  Vitals reviewed. Constitutional: He is oriented to person, place, and time. He appears well-developed and well-nourished. No distress.  HENT:  Head: Normocephalic.  Right Ear: External ear normal.  Left Ear: External ear normal.  Mouth/Throat: Oropharynx is clear and moist.  Eyes: Pupils are equal, round, and reactive to light. Right eye exhibits no discharge. Left eye exhibits no discharge.  Neck: Normal range of motion. Neck supple. No thyromegaly present.  Cardiovascular: Normal rate, regular rhythm, normal heart sounds and intact distal pulses.   No murmur heard. Pulmonary/Chest: Effort normal and breath sounds normal. No respiratory distress. He has no wheezes.  Abdominal: Soft. Bowel sounds are normal. He exhibits no distension. There is no tenderness.  Musculoskeletal: Normal range of motion. He exhibits no edema and no tenderness.  Neurological: He is alert and oriented to person, place, and time. He has normal reflexes. No cranial nerve deficit.  Skin: Skin is warm and  dry. No rash noted. No erythema.  Psychiatric: He has a normal mood and affect. His behavior  is normal. Judgment and thought content normal.      BP 126/74  Pulse 85  Temp(Src) 99.1 F (37.3 C) (Oral)  Ht 5' 8"  (1.727 m)  Wt 248 lb 6.4 oz (112.674 kg)  BMI 37.78 kg/m2     Assessment & Plan:  1. HYPERTENSION - CMP14+EGFR  2. DIABETES MELLITUS -Pt started on Invokana today -Low carb diet - POCT glycosylated hemoglobin (Hb A1C) - Canagliflozin (INVOKANA) 100 MG TABS; Take 1 tablet (100 mg total) by mouth daily before breakfast.  Dispense: 90 tablet; Refill: 2  3. Rheumatoid arthritis  4. HYPERLIPIDEMIA  5. Encounter for vitamin deficiency screening - Vit D  25 hydroxy (rtn osteoporosis monitoring)  6. Screening PSA (prostate specific antigen) - PSA, total and free   Continue all meds Labs pending Health Maintenance reviewed Diet and exercise encouraged RTO 3 months  Evelina Dun, FNP

## 2013-10-28 NOTE — Patient Instructions (Signed)

## 2013-10-29 LAB — PSA, TOTAL AND FREE
PSA FREE: 0.11 ng/mL
PSA, Free Pct: 27.5 %
PSA: 0.4 ng/mL (ref 0.0–4.0)

## 2013-10-29 LAB — CMP14+EGFR
ALK PHOS: 71 IU/L (ref 39–117)
ALT: 26 IU/L (ref 0–44)
AST: 38 IU/L (ref 0–40)
Albumin/Globulin Ratio: 1.6 (ref 1.1–2.5)
Albumin: 4.5 g/dL (ref 3.6–4.8)
BUN / CREAT RATIO: 17 (ref 10–22)
BUN: 18 mg/dL (ref 8–27)
CALCIUM: 9.9 mg/dL (ref 8.6–10.2)
CHLORIDE: 96 mmol/L — AB (ref 97–108)
CO2: 23 mmol/L (ref 18–29)
Creatinine, Ser: 1.08 mg/dL (ref 0.76–1.27)
GFR calc Af Amer: 86 mL/min/{1.73_m2} (ref >59)
GFR calc non Af Amer: 74 mL/min/{1.73_m2} (ref >59)
Globulin, Total: 2.8 g/dL (ref 1.5–4.5)
Glucose: 186 mg/dL — ABNORMAL HIGH (ref 65–99)
Potassium: 4.7 mmol/L (ref 3.5–5.2)
Sodium: 137 mmol/L (ref 134–144)
Total Bilirubin: 0.3 mg/dL (ref 0.0–1.2)
Total Protein: 7.3 g/dL (ref 6.0–8.5)

## 2013-10-29 LAB — VITAMIN D 25 HYDROXY (VIT D DEFICIENCY, FRACTURES): Vit D, 25-Hydroxy: 43.9 ng/mL (ref 30.0–100.0)

## 2013-10-29 LAB — MICROALBUMIN, URINE: Microalbumin, Urine: 23.5 ug/mL — ABNORMAL HIGH (ref 0.0–17.0)

## 2013-11-02 ENCOUNTER — Telehealth: Payer: Self-pay | Admitting: Family Medicine

## 2013-11-02 NOTE — Telephone Encounter (Signed)
Message copied by Waverly Ferrari on Mon Nov 02, 2013 10:10 AM ------      Message from: Lenna Gilford, [place] A      Created: Mon Nov 02, 2013 10:04 AM       Protein in urine & Hgb A1C high- Pt needs to be on low carb diet and keep better control of blood sugars- Continue the Invokana that was started on last visit      Kidney and liver function stable      Vit D levels WNl      PSA levels WNL       ------

## 2013-11-11 ENCOUNTER — Encounter: Payer: Self-pay | Admitting: Cardiovascular Disease

## 2013-11-11 ENCOUNTER — Ambulatory Visit (INDEPENDENT_AMBULATORY_CARE_PROVIDER_SITE_OTHER): Payer: BC Managed Care – PPO | Admitting: Cardiovascular Disease

## 2013-11-11 VITALS — BP 120/74 | HR 62 | Ht 68.0 in | Wt 246.4 lb

## 2013-11-11 DIAGNOSIS — M069 Rheumatoid arthritis, unspecified: Secondary | ICD-10-CM

## 2013-11-11 DIAGNOSIS — E785 Hyperlipidemia, unspecified: Secondary | ICD-10-CM

## 2013-11-11 DIAGNOSIS — I10 Essential (primary) hypertension: Secondary | ICD-10-CM

## 2013-11-11 DIAGNOSIS — E119 Type 2 diabetes mellitus without complications: Secondary | ICD-10-CM

## 2013-11-11 MED ORDER — SIMVASTATIN 10 MG PO TABS: 10.0000 mg | ORAL_TABLET | Freq: Every day | ORAL | Status: DC

## 2013-11-11 MED ORDER — LOSARTAN POTASSIUM 100 MG PO TABS: 50.0000 mg | ORAL_TABLET | Freq: Every day | ORAL | Status: DC

## 2013-11-11 NOTE — Patient Instructions (Signed)
Your physician wants you to follow-up in: YEAR WITH DR NISHAN  You will receive a reminder letter in the mail two months in advance. If you don't receive a letter, please call our office to schedule the follow-up appointment.  Your physician recommends that you continue on your current medications as directed. Please refer to the Current Medication list given to you today. 

## 2013-11-11 NOTE — Assessment & Plan Note (Signed)
Well controlled.  Continue current medications and low sodium Dash type diet.    

## 2013-11-11 NOTE — Progress Notes (Signed)
Patient ID: [name], male   DOB: 1955-10-26, 58 y.o.   MRN: [id number] Garnet is seen today in F/U of CRF's. 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 I reviewed his risk factor profile from [facility]. His HDL was low at 34. He was given samples of Niaspan by Dr Ree Edman and I told him he should try them He will take a coated aspirin 30 minutes prior to . His weight is still up and he needs to focus on a more low carb diet. He eats out a lot and has large portions. He has seen a nutrutionist and is working on this. He denies recurrent SSCP. He has lower back problems and complains about his legs aching in the morning. He has been compliant with his meds. He quit smoking in 1998. He is sedentary and still overweight with a BMI of 37.4. 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  All other systems reviewed and negative  General: Affect appropriate Healthy:  appears stated age 39: normal Neck supple with no adenopathy JVP normal no bruits no thyromegaly Lungs clear with no wheezing and good diaphragmatic motion Heart:  S1/S2 no murmur, no rub, gallop or click PMI normal Abdomen: benighn, BS positve, no tenderness, no AAA no bruit.  No HSM or HJR Distal pulses intact with no bruits No edema Neuro non-focal Skin warm and dry No muscular weakness   Current Outpatient Prescriptions  Medication Sig Dispense Refill  . aspirin 81 MG chewable tablet Chew 81 mg by mouth daily.        . Canagliflozin  (INVOKANA) 100 MG TABS Take 1 tablet (100 mg total) by mouth daily before breakfast.  90 tablet  2  . EPIPEN 2-PAK 0.3 MG/0.3ML SOAJ injection USE AS DIRECTED AS NEEDED  2 Device  1  . losartan (COZAAR) 100 MG tablet Take 50 mg by mouth daily.      . meloxicam (MOBIC) 15 MG tablet Take 15 mg by mouth daily.      . metFORMIN (GLUCOPHAGE) 1000 MG tablet Take 1 tablet (1,000 mg total) by mouth 2 (two) times daily with a meal.  180 tablet  3  . oxyCODONE-acetaminophen (PERCOCET) 10-325 MG per tablet       . simvastatin (ZOCOR) 10 MG tablet Take 1 tablet (10 mg total) by mouth at bedtime.  90 tablet  3   No current facility-administered medications for this visit.    Allergies  Keflex and Shellfish allergy  Electrocardiogram:  SR rate 62 T wave inversion Lead 3   Assessment and Plan

## 2013-11-11 NOTE — Assessment & Plan Note (Addendum)
Failed remicade  Continue f/u rheum  Discussed benefits of diet and exercise in addition to  Iv Rx  Mobic for anti inflamatory

## 2013-11-11 NOTE — Addendum Note (Signed)
Addended by: Devra Dopp E on: 11/11/2013 09:49 AM   Modules accepted: Orders

## 2013-11-11 NOTE — Assessment & Plan Note (Signed)
Discussed low carb diet.  Target hemoglobin A1c is 6.5 or less.  Continue current medications.  

## 2013-11-11 NOTE — Assessment & Plan Note (Signed)
Labs with primary  Offerred calcium score to assess risk of CAD especially given RA  He will consider

## 2014-01-29 ENCOUNTER — Encounter: Payer: Self-pay | Admitting: Family Medicine

## 2014-01-29 ENCOUNTER — Ambulatory Visit (INDEPENDENT_AMBULATORY_CARE_PROVIDER_SITE_OTHER): Payer: BC Managed Care – PPO | Admitting: Family Medicine

## 2014-01-29 VITALS — BP 138/79 | HR 75 | Temp 97.0°F | Ht 68.0 in | Wt 252.4 lb

## 2014-01-29 DIAGNOSIS — I10 Essential (primary) hypertension: Secondary | ICD-10-CM

## 2014-01-29 DIAGNOSIS — E11311 Type 2 diabetes mellitus with unspecified diabetic retinopathy with macular edema: Secondary | ICD-10-CM

## 2014-01-29 DIAGNOSIS — E785 Hyperlipidemia, unspecified: Secondary | ICD-10-CM

## 2014-01-29 LAB — POCT CBC
Granulocyte percent: 60 %G (ref 37–80)
HCT, POC: 43.1 % — AB (ref 43.5–53.7)
Hemoglobin: 14.1 g/dL (ref 14.1–18.1)
Lymph, poc: 2.5 (ref 0.6–3.4)
MCH, POC: 28.6 pg (ref 27–31.2)
MCHC: 32.6 g/dL (ref 31.8–35.4)
MCV: 87.8 fL (ref 80–97)
MPV: 7.2 fL (ref 0–99.8)
POC Granulocyte: 4.3 (ref 2–6.9)
POC LYMPH PERCENT: 35.4 %L (ref 10–50)
Platelet Count, POC: 205 10*3/uL (ref 142–424)
RBC: 4.9 M/uL (ref 4.69–6.13)
RDW, POC: 12.3 %
WBC: 7.2 10*3/uL (ref 4.6–10.2)

## 2014-01-29 LAB — POCT GLYCOSYLATED HEMOGLOBIN (HGB A1C): Hemoglobin A1C: 7

## 2014-01-29 MED ORDER — GLIMEPIRIDE 2 MG PO TABS
2.0000 mg | ORAL_TABLET | Freq: Every day | ORAL | Status: DC
Start: 2014-01-29 — End: 2014-05-05

## 2014-01-29 MED ORDER — GLIMEPIRIDE 2 MG PO TABS: 2.0000 mg | ORAL_TABLET | Freq: Every day | ORAL | Status: DC

## 2014-01-29 NOTE — Progress Notes (Signed)
   Subjective:    Patient ID: [name], male    DOB: December 08, 1955, 58 y.o.   MRN: [id number]  HPI  He states he couldn't tolerate invokana and stopped and his fsbs this am was 188.  He has hx of htn and hyperlipidemia.  He has no other acute complaints Review of Systems  Constitutional: Negative for fever.  HENT: Negative for ear pain.   Eyes: Negative for discharge.  Respiratory: Negative for cough.   Cardiovascular: Negative for chest pain.  Gastrointestinal: Negative for abdominal distention.  Endocrine: Negative for polyuria.  Genitourinary: Negative for difficulty urinating.  Musculoskeletal: Negative for gait problem and neck pain.  Skin: Negative for color change and rash.  Neurological: Negative for speech difficulty and headaches.  Psychiatric/Behavioral: Negative for agitation.       Objective:    BP 138/79  Pulse 75  Temp(Src) 97 F (36.1 C) (Oral)  Ht 5\' 8"  (1.727 m)  Wt 252 lb 6.4 oz (114.488 kg)  BMI 38.39 kg/m2 Physical Exam  Constitutional: He is oriented to person, place, and time. He appears well-developed and well-nourished.  HENT:  Head: Normocephalic and atraumatic.  Mouth/Throat: Oropharynx is clear and moist.  Eyes: Pupils are equal, round, and reactive to light.  Neck: Normal range of motion. Neck supple.  Cardiovascular: Normal rate and regular rhythm.   No murmur heard. Pulmonary/Chest: Effort normal and breath sounds normal.  Abdominal: Soft. Bowel sounds are normal. There is no tenderness.  Neurological: He is alert and oriented to person, place, and time.  Skin: Skin is warm and dry.  Psychiatric: He has a normal mood and affect.          Assessment & Plan:     ICD-9-CM ICD-10-CM  1. 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 Hyperlipidemia 272.4 E78.5  3. Essential hypertension 401.9 I10     No Follow-up on  file.  Lysbeth Penner FNP

## 2014-01-30 LAB — CMP14+EGFR
ALT: 25 IU/L (ref 0–44)
AST: 26 IU/L (ref 0–40)
Albumin/Globulin Ratio: 1.5 (ref 1.1–2.5)
Albumin: 4.3 g/dL (ref 3.6–4.8)
Alkaline Phosphatase: 75 IU/L (ref 39–117)
BUN/Creatinine Ratio: 11 (ref 10–22)
BUN: 14 mg/dL (ref 8–27)
CO2: 22 mmol/L (ref 18–29)
Calcium: 9.4 mg/dL (ref 8.6–10.2)
Chloride: 97 mmol/L (ref 97–108)
Creatinine, Ser: 1.25 mg/dL (ref 0.76–1.27)
GFR calc Af Amer: 72 mL/min/{1.73_m2} (ref >59)
GFR calc non Af Amer: 62 mL/min/{1.73_m2} (ref >59)
Globulin, Total: 2.8 g/dL (ref 1.5–4.5)
Glucose: 273 mg/dL — ABNORMAL HIGH (ref 65–99)
Potassium: 4.7 mmol/L (ref 3.5–5.2)
Sodium: 136 mmol/L (ref 134–144)
Total Bilirubin: 0.3 mg/dL (ref 0.0–1.2)
Total Protein: 7.1 g/dL (ref 6.0–8.5)

## 2014-03-17 ENCOUNTER — Encounter: Payer: Self-pay | Admitting: *Deleted

## 2014-03-20 ENCOUNTER — Encounter: Payer: Self-pay | Admitting: Nurse Practitioner

## 2014-03-20 ENCOUNTER — Ambulatory Visit (INDEPENDENT_AMBULATORY_CARE_PROVIDER_SITE_OTHER): Payer: BC Managed Care – PPO | Admitting: Nurse Practitioner

## 2014-03-20 VITALS — BP 140/85 | HR 70 | Temp 97.5°F | Ht 68.0 in | Wt 250.0 lb

## 2014-03-20 DIAGNOSIS — J208 Acute bronchitis due to other specified organisms: Secondary | ICD-10-CM

## 2014-03-20 MED ORDER — BENZONATATE 100 MG PO CAPS: 100.0000 mg | ORAL_CAPSULE | Freq: Three times a day (TID) | ORAL | Status: DC | PRN

## 2014-03-20 NOTE — Patient Instructions (Signed)

## 2014-03-20 NOTE — Progress Notes (Signed)
   Subjective:    Patient ID: [name], male    DOB: 08/04/1955, 58 y.o.   MRN: [id number]  HPI Patient in today c/o cough and congestion- cough is dry- started 2 weeks ago- cannot get past.  * on arencia infsuions for RA which decreases his immune system.    Review of Systems  Constitutional: Negative.  Negative for fever and chills.  HENT: Positive for congestion.   Respiratory: Positive for cough.   Cardiovascular: Negative.   Genitourinary: Negative.   Neurological: Negative.   Psychiatric/Behavioral: Negative.   All other systems reviewed and are negative.      Objective:   Physical Exam  Constitutional: He is oriented to person, place, and time. He appears well-developed and well-nourished. No distress.  HENT:  Right Ear: Hearing, tympanic membrane, external ear and ear canal normal.  Left Ear: Hearing, tympanic membrane, external ear and ear canal normal.  Nose: Mucosal edema and rhinorrhea present. Right sinus exhibits no maxillary sinus tenderness and no frontal sinus tenderness. Left sinus exhibits no maxillary sinus tenderness and no frontal sinus tenderness.  Mouth/Throat: Uvula is midline, oropharynx is clear and moist and mucous membranes are normal.  Eyes: Pupils are equal, round, and reactive to light.  Neck: Normal range of motion. Neck supple.  Cardiovascular: Normal rate, regular rhythm and normal heart sounds.   Pulmonary/Chest: Effort normal and breath sounds normal. No respiratory distress. He has no wheezes. He has no rales. He exhibits no tenderness.  Deep dry cough  Lymphadenopathy:    He has no cervical adenopathy.  Neurological: He is alert and oriented to person, place, and time.  Skin: Skin is warm and dry.  Psychiatric: He has a normal mood and affect. His behavior is normal. Judgment and thought content normal.   BP 140/85 mmHg  Pulse 70  Temp(Src) 97.5 F (36.4 C) (Oral)  Ht 5\' 8"  (1.727 m)  Wt 250 lb (113.399 kg)  BMI 38.02  kg/m2        Assessment & Plan:   1. Viral bronchitis    Meds ordered this encounter  Medications  . benzonatate (TESSALON PERLES) 100 MG capsule    Sig: Take 1 capsule (100 mg total) by mouth 3 (three) times daily as needed for cough.    Dispense:  20 capsule    Refill:  0    Order Specific Question:  Supervising Provider    Answer:  Chipper Herb [[id number]]   1. Take meds as prescribed 2. Use a cool mist humidifier especially during the winter months and when heat has been humid. 3. Use saline nose sprays frequently 4. Saline irrigations of the nose can be very helpful if done frequently.  * 4X daily for 1 week*  * Use of a nettie pot can be helpful with this. Follow directions with this* 5. Drink plenty of fluids 6. Keep thermostat turn down low 7.For any cough or congestion  Use plain Mucinex- regular strength or max strength is fine   * Children- consult with Pharmacist for dosing 8. For fever or aces or pains- take tylenol or ibuprofen appropriate for age and weight.  * for fevers greater than 101 orally you may alternate ibuprofen and tylenol every  3 hours.   Mary-Margaret Hassell Done, FNP

## 2014-04-29 ENCOUNTER — Other Ambulatory Visit: Payer: Self-pay | Admitting: *Deleted

## 2014-04-29 DIAGNOSIS — E1165 Type 2 diabetes mellitus with hyperglycemia: Secondary | ICD-10-CM

## 2014-04-29 DIAGNOSIS — IMO0002 Reserved for concepts with insufficient information to code with codable children: Secondary | ICD-10-CM

## 2014-04-29 MED ORDER — METFORMIN HCL 1000 MG PO TABS: 1000.0000 mg | ORAL_TABLET | Freq: Two times a day (BID) | ORAL | Status: DC

## 2014-05-05 ENCOUNTER — Encounter: Payer: Self-pay | Admitting: Family

## 2014-05-05 ENCOUNTER — Ambulatory Visit (INDEPENDENT_AMBULATORY_CARE_PROVIDER_SITE_OTHER): Payer: BLUE CROSS/BLUE SHIELD | Admitting: Family

## 2014-05-05 VITALS — BP 149/92 | HR 90 | Temp 97.2°F | Ht 68.0 in | Wt 254.6 lb

## 2014-05-05 DIAGNOSIS — M069 Rheumatoid arthritis, unspecified: Secondary | ICD-10-CM

## 2014-05-05 DIAGNOSIS — I10 Essential (primary) hypertension: Secondary | ICD-10-CM

## 2014-05-05 DIAGNOSIS — Z1321 Encounter for screening for nutritional disorder: Secondary | ICD-10-CM

## 2014-05-05 DIAGNOSIS — E785 Hyperlipidemia, unspecified: Secondary | ICD-10-CM

## 2014-05-05 DIAGNOSIS — E669 Obesity, unspecified: Secondary | ICD-10-CM

## 2014-05-05 DIAGNOSIS — E1165 Type 2 diabetes mellitus with hyperglycemia: Secondary | ICD-10-CM

## 2014-05-05 LAB — POCT GLYCOSYLATED HEMOGLOBIN (HGB A1C): HEMOGLOBIN A1C: 7.5

## 2014-05-05 MED ORDER — METFORMIN HCL 1000 MG PO TABS: 1000.0000 mg | ORAL_TABLET | Freq: Two times a day (BID) | ORAL | Status: DC

## 2014-05-05 MED ORDER — LOSARTAN POTASSIUM 100 MG PO TABS
100.0000 mg | ORAL_TABLET | Freq: Every day | ORAL | Status: DC
Start: 2014-05-05 — End: 2014-10-15

## 2014-05-05 NOTE — Patient Instructions (Addendum)
Health Maintenance A healthy lifestyle and preventative care can promote health and wellness.  Maintain regular health, dental, and eye exams.  Eat a healthy diet. Foods like vegetables, fruits, whole grains, low-fat dairy products, and lean protein foods contain the nutrients you need and are low in calories. Decrease your intake of foods high in solid fats, added sugars, and salt. Get information about a proper diet from your health care provider, if necessary.  Regular physical exercise is one of the most important things you can do for your health. Most adults should get at least 150 minutes of moderate-intensity exercise (any activity that increases your heart rate and causes you to sweat) each week. In addition, most adults need muscle-strengthening exercises on 2 or more days a week.   Maintain a healthy weight. The body mass index (BMI) is a screening tool to identify possible weight problems. It provides an estimate of body fat based on height and weight. Your health care provider can find your BMI and can help you achieve or maintain a healthy weight. For males 20 years and older:  A BMI below 18.5 is considered underweight.  A BMI of 18.5 to 24.9 is normal.  A BMI of 25 to 29.9 is considered overweight.  A BMI of 30 and above is considered obese.  Maintain normal blood lipids and cholesterol by exercising and minimizing your intake of saturated fat. Eat a balanced diet with plenty of fruits and vegetables. Blood tests for lipids and cholesterol should begin at age 20 and be repeated every 5 years. If your lipid or cholesterol levels are high, you are over age 50, or you are at high risk for heart disease, you may need your cholesterol levels checked more frequently.Ongoing high lipid and cholesterol levels should be treated with medicines if diet and exercise are not working.  If you smoke, find out from your health care provider how to quit. If you do not use tobacco, do not  start.  Lung cancer screening is recommended for adults aged 55-80 years who are at high risk for developing lung cancer because of a history of smoking. A yearly low-dose CT scan of the lungs is recommended for people who have at least a 30-pack-year history of smoking and are current smokers or have quit within the past 15 years. A pack year of smoking is smoking an average of 1 pack of cigarettes a day for 1 year (for example, a 30-pack-year history of smoking could mean smoking 1 pack a day for 30 years or 2 packs a day for 15 years). Yearly screening should continue until the smoker has stopped smoking for at least 15 years. Yearly screening should be stopped for people who develop a health problem that would prevent them from having lung cancer treatment.  If you choose to drink alcohol, do not have more than 2 drinks per day. One drink is considered to be 12 oz (360 mL) of beer, 5 oz (150 mL) of wine, or 1.5 oz (45 mL) of liquor.  Avoid the use of street drugs. Do not share needles with anyone. Ask for help if you need support or instructions about stopping the use of drugs.  High blood pressure causes heart disease and increases the risk of stroke. Blood pressure should be checked at least every 1-2 years. Ongoing high blood pressure should be treated with medicines if weight loss and exercise are not effective.  If you are 45-79 years old, ask your health care provider if   you should take aspirin to prevent heart disease.  Diabetes screening involves taking a blood sample to check your fasting blood sugar level. This should be done once every 3 years after age 45 if you are at a normal weight and without risk factors for diabetes. Testing should be considered at a younger age or be carried out more frequently if you are overweight and have at least 1 risk factor for diabetes.  Colorectal cancer can be detected and often prevented. Most routine colorectal cancer screening begins at the age of 50  and continues through age 75. However, your health care provider may recommend screening at an earlier age if you have risk factors for colon cancer. On a yearly basis, your health care provider may provide home test kits to check for hidden blood in the stool. A small camera at the end of a tube may be used to directly examine the colon (sigmoidoscopy or colonoscopy) to detect the earliest forms of colorectal cancer. Talk to your health care provider about this at age 50 when routine screening begins. A direct exam of the colon should be repeated every 5-10 years through age 75, unless early forms of precancerous polyps or small growths are found.  People who are at an increased risk for hepatitis B should be screened for this virus. You are considered at high risk for hepatitis B if:  You were born in a country where hepatitis B occurs often. Talk with your health care provider about which countries are considered high risk.  Your parents were born in a high-risk country and you have not received a shot to protect against hepatitis B (hepatitis B vaccine).  You have HIV or AIDS.  You use needles to inject street drugs.  You live with, or have sex with, someone who has hepatitis B.  You are a man who has sex with other men (MSM).  You get hemodialysis treatment.  You take certain medicines for conditions like cancer, organ transplantation, and autoimmune conditions.  Hepatitis C blood testing is recommended for all people born from 1945 through 1965 and any individual with known risk factors for hepatitis C.  Healthy men should no longer receive prostate-specific antigen (PSA) blood tests as part of routine cancer screening. Talk to your health care provider about prostate cancer screening.  Testicular cancer screening is not recommended for adolescents or adult males who have no symptoms. Screening includes self-exam, a health care provider exam, and other screening tests. Consult with your  health care provider about any symptoms you have or any concerns you have about testicular cancer.  Practice safe sex. Use condoms and avoid high-risk sexual practices to reduce the spread of sexually transmitted infections (STIs).  You should be screened for STIs, including gonorrhea and chlamydia if:  You are sexually active and are younger than 24 years.  You are older than 24 years, and your health care provider tells you that you are at risk for this type of infection.  Your sexual activity has changed since you were last screened, and you are at an increased risk for chlamydia or gonorrhea. Ask your health care provider if you are at risk.  If you are at risk of being infected with HIV, it is recommended that you take a prescription medicine daily to prevent HIV infection. This is called pre-exposure prophylaxis (PrEP). You are considered at risk if:  You are a man who has sex with other men (MSM).  You are a heterosexual man who   is sexually active with multiple partners.  You take drugs by injection.  You are sexually active with a partner who has HIV.  Talk with your health care provider about whether you are at high risk of being infected with HIV. If you choose to begin PrEP, you should first be tested for HIV. You should then be tested every 3 months for as long as you are taking PrEP.  Use sunscreen. Apply sunscreen liberally and repeatedly throughout the day. You should seek shade when your shadow is shorter than you. Protect yourself by wearing long sleeves, pants, a wide-brimmed hat, and sunglasses year round whenever you are outdoors.  Tell your health care provider of new moles or changes in moles, especially if there is a change in shape or color. Also, tell your health care provider if a mole is larger than the size of a pencil eraser.  A one-time screening for abdominal aortic aneurysm (AAA) and surgical repair of large AAAs by ultrasound is recommended for men aged  65-75 years who are current or former smokers.  Stay current with your vaccines (immunizations). Document Released: 09/22/2007 Document Revised: 03/31/2013 Document Reviewed: 08/21/2010 [employer] Patient Information 2015 [employer]. This information is not intended to replace advice given to you by your health care provider. Make sure you discuss any questions you have with your health care provider. DASH Eating Plan DASH stands for "Dietary Approaches to Stop Hypertension." The DASH eating plan is a healthy eating plan that has been shown to reduce high blood pressure (hypertension). Additional health benefits may include reducing the risk of type 2 diabetes mellitus, heart disease, and stroke. The DASH eating plan may also help with weight loss. WHAT DO I NEED TO KNOW ABOUT THE DASH EATING PLAN? For the DASH eating plan, you will follow these general guidelines:  Choose foods with a percent daily value for sodium of less than 5% (as listed on the food label).  Use salt-free seasonings or herbs instead of table salt or sea salt.  Check with your health care provider or pharmacist before using salt substitutes.  Eat lower-sodium products, often labeled as "lower sodium" or "no salt added."  Eat fresh foods.  Eat more vegetables, fruits, and low-fat dairy products.  Choose whole grains. Look for the word "whole" as the first word in the ingredient list.  Choose fish and skinless chicken or turkey more often than red meat. Limit fish, poultry, and meat to 6 oz (170 g) each day.  Limit sweets, desserts, sugars, and sugary drinks.  Choose heart-healthy fats.  Limit cheese to 1 oz (28 g) per day.  Eat more home-cooked food and less restaurant, buffet, and fast food.  Limit fried foods.  Cook foods using methods other than frying.  Limit canned vegetables. If you do use them, rinse them well to decrease the sodium.  When eating at a restaurant, ask that your food be prepared  with less salt, or no salt if possible. WHAT FOODS CAN I EAT? Seek help from a dietitian for individual calorie needs. Grains Whole grain or whole wheat bread. Brown rice. Whole grain or whole wheat pasta. Quinoa, bulgur, and whole grain cereals. Low-sodium cereals. Corn or whole wheat flour tortillas. Whole grain cornbread. Whole grain crackers. Low-sodium crackers. Vegetables Fresh or frozen vegetables (raw, steamed, roasted, or grilled). Low-sodium or reduced-sodium tomato and vegetable juices. Low-sodium or reduced-sodium tomato sauce and paste. Low-sodium or reduced-sodium canned vegetables.  Fruits All fresh, canned (in natural juice), or frozen   fruits. Meat and Other Protein Products Ground beef (85% or leaner), grass-fed beef, or beef trimmed of fat. Skinless chicken or [place]. Ground chicken or [place]. Pork trimmed of fat. All fish and seafood. Eggs. Dried beans, peas, or lentils. Unsalted nuts and seeds. Unsalted canned beans. Dairy Low-fat dairy products, such as skim or 1% milk, 2% or reduced-fat cheeses, low-fat ricotta or cottage cheese, or plain low-fat yogurt. Low-sodium or reduced-sodium cheeses. Fats and Oils Tub margarines without trans fats. Light or reduced-fat mayonnaise and salad dressings (reduced sodium). Avocado. Safflower, olive, or canola oils. Natural peanut or almond butter. Other Unsalted popcorn and pretzels. The items listed above may not be a complete list of recommended foods or beverages. Contact your dietitian for more options. WHAT FOODS ARE NOT RECOMMENDED? Grains White bread. White pasta. White rice. Refined cornbread. Bagels and croissants. Crackers that contain trans fat. Vegetables Creamed or fried vegetables. Vegetables in a cheese sauce. Regular canned vegetables. Regular canned tomato sauce and paste. Regular tomato and vegetable juices. Fruits Dried fruits. Canned fruit in light or heavy syrup. Fruit juice. Meat and Other Protein Products Fatty  cuts of meat. Ribs, chicken wings, bacon, sausage, bologna, salami, chitterlings, fatback, hot dogs, bratwurst, and packaged luncheon meats. Salted nuts and seeds. Canned beans with salt. Dairy Whole or 2% milk, cream, half-and-half, and cream cheese. Whole-fat or sweetened yogurt. Full-fat cheeses or blue cheese. Nondairy creamers and whipped toppings. Processed cheese, cheese spreads, or cheese curds. Condiments Onion and garlic salt, seasoned salt, table salt, and sea salt. Canned and packaged gravies. Worcestershire sauce. Tartar sauce. Barbecue sauce. Teriyaki sauce. Soy sauce, including reduced sodium. Steak sauce. Fish sauce. Oyster sauce. Cocktail sauce. Horseradish. Ketchup and mustard. Meat flavorings and tenderizers. Bouillon cubes. Hot sauce. Tabasco sauce. Marinades. Taco seasonings. Relishes. Fats and Oils Butter, stick margarine, lard, shortening, ghee, and bacon fat. Coconut, palm kernel, or palm oils. Regular salad dressings. Other Pickles and olives. Salted popcorn and pretzels. The items listed above may not be a complete list of foods and beverages to avoid. Contact your dietitian for more information. WHERE CAN I FIND MORE INFORMATION? National Heart, Lung, and Blood Institute: travelstabloid.com Document Released: 03/15/2011 Document Revised: 08/10/2013 Document Reviewed: 01/28/2013 [facility] Patient Information 2015 [place], [place]. This information is not intended to replace advice given to you by your health care provider. Make sure you discuss any questions you have with your health care provider.

## 2014-05-05 NOTE — Progress Notes (Signed)
Subjective:    Patient ID: HUY MAJID, male    DOB: 02/11/1956, 59 y.o.   MRN: [id number]  Diabetes He presents for his follow-up diabetic visit. He has type 2 diabetes mellitus. His disease course has been improving. Pertinent negatives for hypoglycemia include no confusion, dizziness or headaches. Pertinent negatives for diabetes include no blurred vision, no foot paresthesias, no foot ulcerations and no visual change. Pertinent negatives for hypoglycemia complications include no blackouts. Symptoms are worsening. Pertinent negatives for diabetic complications include no CVA, heart disease, nephropathy or peripheral neuropathy. Risk factors for coronary artery disease include diabetes mellitus, dyslipidemia, hypertension, male sex and family history. Current diabetic treatment includes oral agent (monotherapy). He is compliant with treatment all of the time. He is following a generally healthy diet. His breakfast blood glucose range is generally 140-180 mg/dl. An ACE inhibitor/angiotensin II receptor blocker is being taken. Eye exam is current.  Hypertension This is a chronic problem. The current episode started more than 1 year ago. The problem is unchanged. The problem is uncontrolled. Pertinent negatives include no blurred vision, headaches, palpitations or peripheral edema. Risk factors for coronary artery disease include diabetes mellitus, dyslipidemia, family history, male gender and obesity. Past treatments include angiotensin blockers. The current treatment provides mild improvement. There is no history of kidney disease, CAD/MI, CVA, heart failure or a thyroid problem. There is no history of sleep apnea.  Hyperlipidemia This is a chronic problem. The current episode started more than 1 year ago. The problem is uncontrolled. Recent lipid tests were reviewed and are high. Exacerbating diseases include diabetes and obesity. He has no history of hypothyroidism. Factors aggravating his  hyperlipidemia include fatty foods. Associated symptoms include leg pain. Pertinent negatives include no myalgias. Current antihyperlipidemic treatment includes statins. The current treatment provides moderate improvement of lipids. Risk factors for coronary artery disease include diabetes mellitus, dyslipidemia, family history, male sex and obesity.      Review of Systems  Constitutional: Negative.   HENT: Negative.   Eyes: Negative for blurred vision.  Respiratory: Negative.   Cardiovascular: Negative.  Negative for palpitations.  Gastrointestinal: Negative.   Endocrine: Negative.   Genitourinary: Negative.   Musculoskeletal: Negative.  Negative for myalgias.  Neurological: Negative.  Negative for dizziness and headaches.  Hematological: Negative.   Psychiatric/Behavioral: Negative.  Negative for confusion.  All other systems reviewed and are negative.      Objective:   Physical Exam  Constitutional: He is oriented to person, place, and time. He appears well-developed and well-nourished. No distress.  HENT:  Head: Normocephalic.  Right Ear: External ear normal.  Left Ear: External ear normal.  Mouth/Throat: Oropharynx is clear and moist.  Eyes: Pupils are equal, round, and reactive to light. Right eye exhibits no discharge. Left eye exhibits no discharge.  Neck: Normal range of motion. Neck supple. No thyromegaly present.  Cardiovascular: Normal rate, regular rhythm, normal heart sounds and intact distal pulses.   No murmur heard. Pulmonary/Chest: Effort normal and breath sounds normal. No respiratory distress. He has no wheezes.  Abdominal: Soft. Bowel sounds are normal. He exhibits no distension. There is no tenderness.  Musculoskeletal: Normal range of motion. He exhibits no edema or tenderness.  Neurological: He is alert and oriented to person, place, and time. He has normal reflexes. No cranial nerve deficit.  Skin: Skin is warm and dry. No rash noted. No erythema.    Psychiatric: He has a normal mood and affect. His behavior is normal. Judgment and thought content  normal.  Vitals reviewed.  BP 149/92 mmHg  Pulse 90  Temp(Src) 97.2 F (36.2 C) (Oral)  Ht 5' 8"  (1.727 m)  Wt 254 lb 9.6 oz (115.486 kg)  BMI 38.72 kg/m2        Assessment & Plan:  1. Essential hypertension -Dash diet information given -Exercise encouraged - Stress Management  -Continue current meds -RTO in 2 wekks  CMP14+EGFR - losartan (COZAAR) 100 MG tablet; Take 1 tablet (100 mg total) by mouth daily.  Dispense: 90 tablet; Refill: 3  2. Type 2 diabetes mellitus with hyperglycemia -May add janumet if HgbA1C too high- Pt unable to take invokana or amaryl r/t side effects  CMP14+EGFR - POCT glycosylated hemoglobin (Hb A1C) - metFORMIN (GLUCOPHAGE) 1000 MG tablet; Take 1 tablet (1,000 mg total) by mouth 2 (two) times daily with a meal.  Dispense: 180 tablet; Refill: 4  3. Rheumatoid arthritis - CMP14+EGFR - Testosterone,Free and Total  4. Obesity - CMP14+EGFR  5. Hyperlipidemia - CMP14+EGFR  6. Encounter for vitamin deficiency screening - Vit D  25 hydroxy (rtn osteoporosis monitoring)   Continue all meds Labs pending Health Maintenance reviewed Diet and exercise encouraged RTO 2 weeks for BP  Evelina Dun, FNP

## 2014-05-06 LAB — CMP14+EGFR
A/G RATIO: 1.6 (ref 1.1–2.5)
ALT: 32 IU/L (ref 0–44)
AST: 32 IU/L (ref 0–40)
Albumin: 4.3 g/dL (ref 3.6–4.8)
Alkaline Phosphatase: 76 IU/L (ref 39–117)
BUN/Creatinine Ratio: 14 (ref 10–22)
BUN: 19 mg/dL (ref 8–27)
CO2: 25 mmol/L (ref 18–29)
Calcium: 9.4 mg/dL (ref 8.6–10.2)
Chloride: 95 mmol/L — ABNORMAL LOW (ref 97–108)
Creatinine, Ser: 1.34 mg/dL — ABNORMAL HIGH (ref 0.76–1.27)
GFR, EST AFRICAN AMERICAN: 66 mL/min/{1.73_m2} (ref >59)
GFR, EST NON AFRICAN AMERICAN: 57 mL/min/{1.73_m2} — AB (ref >59)
GLOBULIN, TOTAL: 2.7 g/dL (ref 1.5–4.5)
Glucose: 258 mg/dL — ABNORMAL HIGH (ref 65–99)
Potassium: 4.5 mmol/L (ref 3.5–5.2)
Sodium: 136 mmol/L (ref 134–144)
Total Bilirubin: 0.3 mg/dL (ref 0.0–1.2)
Total Protein: 7 g/dL (ref 6.0–8.5)

## 2014-05-06 LAB — TESTOSTERONE,FREE AND TOTAL
Testosterone, Free: 5.3 pg/mL — ABNORMAL LOW (ref 6.6–18.1)
Testosterone: 417 ng/dL (ref 348–1197)

## 2014-05-06 LAB — VITAMIN D 25 HYDROXY (VIT D DEFICIENCY, FRACTURES): Vit D, 25-Hydroxy: 26.2 ng/mL — ABNORMAL LOW (ref 30.0–100.0)

## 2014-05-07 ENCOUNTER — Other Ambulatory Visit: Payer: Self-pay | Admitting: Family

## 2014-05-07 MED ORDER — SITAGLIPTIN PHOSPHATE 100 MG PO TABS: 100.0000 mg | ORAL_TABLET | Freq: Every day | ORAL | Status: DC

## 2014-05-07 MED ORDER — TESTOSTERONE 25 MG/2.5GM (1%) TD GEL: TRANSDERMAL | Status: DC

## 2014-05-10 ENCOUNTER — Telehealth: Payer: Self-pay | Admitting: *Deleted

## 2014-05-10 NOTE — Telephone Encounter (Signed)
-----   Message from Sharion Balloon, [place] sent at 05/07/2014 11:13 AM EST ----- HgbA1C elevated- Januvia rx sent to pharmacy Liver function stable- Creatinine elevated- Pt needs good control of blood sugars- If continues to raise will need to see Kidney dr- On two week follow-up will redraw BMP Vit D levels low- Pt needs to start OTC Vit D Testosterone levels slightly low- RX sent to pharmacy

## 2014-05-10 NOTE — Telephone Encounter (Signed)
Need to discuss lab results and new medications.

## 2014-05-11 ENCOUNTER — Telehealth: Payer: Self-pay | Admitting: Family

## 2014-05-11 NOTE — Telephone Encounter (Signed)
Stp he states his copay for Januvia would be $50 a month and he couldn't afford that. Advised pt we could give him a 4 week supply from samples and then coupon card was given which should help with the copay, pt will pick up tomorrow.

## 2014-05-11 NOTE — Telephone Encounter (Signed)
Patient advised of labs and meds sent to pharmacy. Will call back with any questions.

## 2014-05-20 ENCOUNTER — Ambulatory Visit: Payer: BLUE CROSS/BLUE SHIELD | Admitting: Family

## 2014-06-01 ENCOUNTER — Encounter: Payer: Self-pay | Admitting: Family

## 2014-06-01 ENCOUNTER — Ambulatory Visit (INDEPENDENT_AMBULATORY_CARE_PROVIDER_SITE_OTHER): Payer: BLUE CROSS/BLUE SHIELD | Admitting: Family

## 2014-06-01 VITALS — BP 151/96 | HR 90 | Temp 97.4°F | Ht 68.0 in | Wt 250.8 lb

## 2014-06-01 DIAGNOSIS — E1165 Type 2 diabetes mellitus with hyperglycemia: Secondary | ICD-10-CM

## 2014-06-01 DIAGNOSIS — R5383 Other fatigue: Secondary | ICD-10-CM

## 2014-06-01 DIAGNOSIS — J069 Acute upper respiratory infection, unspecified: Secondary | ICD-10-CM

## 2014-06-01 DIAGNOSIS — I10 Essential (primary) hypertension: Secondary | ICD-10-CM

## 2014-06-01 DIAGNOSIS — R05 Cough: Secondary | ICD-10-CM

## 2014-06-01 DIAGNOSIS — R059 Cough, unspecified: Secondary | ICD-10-CM

## 2014-06-01 DIAGNOSIS — E785 Hyperlipidemia, unspecified: Secondary | ICD-10-CM

## 2014-06-01 MED ORDER — HYDROCODONE-HOMATROPINE 5-1.5 MG/5ML PO SYRP: 5.0000 mL | ORAL_SOLUTION | Freq: Three times a day (TID) | ORAL | Status: DC | PRN

## 2014-06-01 MED ORDER — METFORMIN HCL 1000 MG PO TABS: 1000.0000 mg | ORAL_TABLET | Freq: Two times a day (BID) | ORAL | Status: DC

## 2014-06-01 MED ORDER — BENZONATATE 200 MG PO CAPS: 200.0000 mg | ORAL_CAPSULE | Freq: Three times a day (TID) | ORAL | Status: DC | PRN

## 2014-06-01 MED ORDER — AZITHROMYCIN 250 MG PO TABS: ORAL_TABLET | ORAL | Status: DC

## 2014-06-01 NOTE — Progress Notes (Signed)
Subjective:    Patient ID: [name], male    DOB: Oct 17, 1955, 59 y.o.   MRN: [id number]  Hypertension This is a chronic problem. The current episode started more than 1 year ago. The problem has been waxing and waning since onset. The problem is uncontrolled. Pertinent negatives include no anxiety, headaches, palpitations, peripheral edema or shortness of breath.  Cough This is a new problem. The current episode started in the past 7 days. The problem has been unchanged. The problem occurs constantly. The cough is non-productive. Associated symptoms include myalgias, nasal congestion, postnasal drip, rhinorrhea and wheezing. Pertinent negatives include no chills, ear congestion, ear pain, fever, headaches, sore throat or shortness of breath. The symptoms are aggravated by lying down. Treatments tried: tessalon pearls. The treatment provided mild relief. There is no history of asthma or COPD.   *Pt states he has been taking his BP at home and his average is 120's/ 80's.    Review of Systems  Constitutional: Negative.  Negative for fever and chills.  HENT: Positive for postnasal drip and rhinorrhea. Negative for ear pain and sore throat.   Respiratory: Positive for cough and wheezing. Negative for shortness of breath.   Cardiovascular: Negative.  Negative for palpitations.  Gastrointestinal: Negative.   Endocrine: Negative.   Genitourinary: Negative.   Musculoskeletal: Positive for myalgias.  Neurological: Negative.  Negative for headaches.  Hematological: Negative.   Psychiatric/Behavioral: Negative.   All other systems reviewed and are negative.      Objective:   Physical Exam  Constitutional: He is oriented to person, place, and time. He appears well-developed and well-nourished. No distress.  HENT:  Head: Normocephalic.  Right Ear: External ear normal.  Left Ear: External ear normal.  Mouth/Throat: Oropharynx is clear and moist.  Nasal passage erythemas with mild  swelling    Eyes: Pupils are equal, round, and reactive to light. Right eye exhibits no discharge. Left eye exhibits no discharge.  Neck: Normal range of motion. Neck supple. No thyromegaly present.  Cardiovascular: Normal rate, regular rhythm, normal heart sounds and intact distal pulses.   No murmur heard. Pulmonary/Chest: Effort normal and breath sounds normal. No respiratory distress. He has no wheezes.  Abdominal: Soft. Bowel sounds are normal. He exhibits no distension. There is no tenderness.  Musculoskeletal: Normal range of motion. He exhibits no edema or tenderness.  Neurological: He is alert and oriented to person, place, and time. He has normal reflexes. No cranial nerve deficit.  Skin: Skin is warm and dry. No rash noted. No erythema.  Psychiatric: He has a normal mood and affect. His behavior is normal. Judgment and thought content normal.  Vitals reviewed.   BP 151/96 mmHg  Pulse 90  Temp(Src) 97.4 F (36.3 C) (Oral)  Ht 5' 8"  (1.727 m)  Wt 250 lb 12.8 oz (113.762 kg)  BMI 38.14 kg/m2       Assessment & Plan:  1. Essential hypertension - CMP14+EGFR - Lipid panel  2. URI (upper respiratory infection) - benzonatate (TESSALON) 200 MG capsule; Take 1 capsule (200 mg total) by mouth 3 (three) times daily as needed.  Dispense: 30 capsule; Refill: 1 - azithromycin (ZITHROMAX) 250 MG tablet; Take 500 mg once, then 250 mg for four days  Dispense: 6 tablet; Refill: 0 - HYDROcodone-homatropine (HYCODAN) 5-1.5 MG/5ML syrup; Take 5 mLs by mouth every 8 (eight) hours as needed for cough.  Dispense: 120 mL; Refill: 0  3. Cough - benzonatate (TESSALON) 200 MG capsule; Take 1 capsule (  200 mg total) by mouth 3 (three) times daily as needed.  Dispense: 30 capsule; Refill: 1 - HYDROcodone-homatropine (HYCODAN) 5-1.5 MG/5ML syrup; Take 5 mLs by mouth every 8 (eight) hours as needed for cough.  Dispense: 120 mL; Refill: 0  4. Hyperlipidemia - Lipid panel  5. Other fatigue -  Thyroid Panel With TSH  6. Type 2 diabetes mellitus with hyperglycemia - metFORMIN (GLUCOPHAGE) 1000 MG tablet; Take 1 tablet (1,000 mg total) by mouth 2 (two) times daily with a meal.  Dispense: 180 tablet; Refill: 4   Continue all meds Labs pending Health Maintenance reviewed Diet and exercise encouraged RTO 3 months  Evelina Dun, FNP

## 2014-06-01 NOTE — Patient Instructions (Signed)
Upper Respiratory Infection, Adult An upper respiratory infection (URI) is also sometimes known as the common cold. The upper respiratory tract includes the nose, sinuses, throat, trachea, and bronchi. Bronchi are the airways leading to the lungs. Most people improve within 1 week, but symptoms can last up to 2 weeks. A residual cough may last even longer.  CAUSES Many different viruses can infect the tissues lining the upper respiratory tract. The tissues become irritated and inflamed and often become very moist. Mucus production is also common. A cold is contagious. You can easily spread the virus to others by oral contact. This includes kissing, sharing a glass, coughing, or sneezing. Touching your mouth or nose and then touching a surface, which is then touched by another person, can also spread the virus. SYMPTOMS  Symptoms typically develop 1 to 3 days after you come in contact with a cold virus. Symptoms vary from person to person. They may include:  Runny nose.  Sneezing.  Nasal congestion.  Sinus irritation.  Sore throat.  Loss of voice (laryngitis).  Cough.  Fatigue.  Muscle aches.  Loss of appetite.  Headache.  Low-grade fever. DIAGNOSIS  You might diagnose your own cold based on familiar symptoms, since most people get a cold 2 to 3 times a year. Your caregiver can confirm this based on your exam. Most importantly, your caregiver can check that your symptoms are not due to another disease such as strep throat, sinusitis, pneumonia, asthma, or epiglottitis. Blood tests, throat tests, and X-rays are not necessary to diagnose a common cold, but they may sometimes be helpful in excluding other more serious diseases. Your caregiver will decide if any further tests are required. RISKS AND COMPLICATIONS  You may be at risk for a more severe case of the common cold if you smoke cigarettes, have chronic heart disease (such as heart failure) or lung disease (such as asthma), or if  you have a weakened immune system. The very young and very old are also at risk for more serious infections. Bacterial sinusitis, middle ear infections, and bacterial pneumonia can complicate the common cold. The common cold can worsen asthma and chronic obstructive pulmonary disease (COPD). Sometimes, these complications can require emergency medical care and may be life-threatening. PREVENTION  The best way to protect against getting a cold is to practice good hygiene. Avoid oral or hand contact with people with cold symptoms. Wash your hands often if contact occurs. There is no clear evidence that vitamin C, vitamin E, echinacea, or exercise reduces the chance of developing a cold. However, it is always recommended to get plenty of rest and practice good nutrition. TREATMENT  Treatment is directed at relieving symptoms. There is no cure. Antibiotics are not effective, because the infection is caused by a virus, not by bacteria. Treatment may include:  Increased fluid intake. Sports drinks offer valuable electrolytes, sugars, and fluids.  Breathing heated mist or steam (vaporizer or shower).  Eating chicken soup or other clear broths, and maintaining good nutrition.  Getting plenty of rest.  Using gargles or lozenges for comfort.  Controlling fevers with ibuprofen or acetaminophen as directed by your caregiver.  Increasing usage of your inhaler if you have asthma. Zinc gel and zinc lozenges, taken in the first 24 hours of the common cold, can shorten the duration and lessen the severity of symptoms. Pain medicines may help with fever, muscle aches, and throat pain. A variety of non-prescription medicines are available to treat congestion and runny nose. Your caregiver   can make recommendations and may suggest nasal or lung inhalers for other symptoms.  HOME CARE INSTRUCTIONS   Only take over-the-counter or prescription medicines for pain, discomfort, or fever as directed by your  caregiver.  Use a warm mist humidifier or inhale steam from a shower to increase air moisture. This may keep secretions moist and make it easier to breathe.  Drink enough water and fluids to keep your urine clear or pale yellow.  Rest as needed.  Return to work when your temperature has returned to normal or as your caregiver advises. You may need to stay home longer to avoid infecting others. You can also use a face mask and careful hand washing to prevent spread of the virus. SEEK MEDICAL CARE IF:   After the first few days, you feel you are getting worse rather than better.  You need your caregiver's advice about medicines to control symptoms.  You develop chills, worsening shortness of breath, or brown or red sputum. These may be signs of pneumonia.  You develop yellow or brown nasal discharge or pain in the face, especially when you bend forward. These may be signs of sinusitis.  You develop a fever, swollen neck glands, pain with swallowing, or white areas in the back of your throat. These may be signs of strep throat. SEEK IMMEDIATE MEDICAL CARE IF:   You have a fever.  You develop severe or persistent headache, ear pain, sinus pain, or chest pain.  You develop wheezing, a prolonged cough, cough up blood, or have a change in your usual mucus (if you have chronic lung disease).  You develop sore muscles or a stiff neck. Document Released: 09/19/2000 Document Revised: 06/18/2011 Document Reviewed: 07/01/2013 [employer] Patient Information 2015 [employer]. This information is not intended to replace advice given to you by your health care provider. Make sure you discuss any questions you have with your health care provider.  - Take meds as prescribed - Use a cool mist humidifier  -Use saline nose sprays frequently -Saline irrigations of the nose can be very helpful if done frequently.  * 4X daily for 1 week*  * Use of a nettie pot can be helpful with this. Follow  directions with this* -Force fluids -For any cough or congestion  Use plain Mucinex- regular strength or max strength is fine   * Children- consult with Pharmacist for dosing -For fever or aces or pains- take tylenol or ibuprofen appropriate for age and weight.  * for fevers greater than 101 orally you may alternate ibuprofen and tylenol every  3 hours. -Throat lozenges if help   Christy Hawks, FNP   

## 2014-06-02 ENCOUNTER — Encounter: Payer: Self-pay | Admitting: Family

## 2014-06-02 LAB — LIPID PANEL
CHOLESTEROL TOTAL: 120 mg/dL (ref 100–199)
Chol/HDL Ratio: 4.1 ratio units (ref 0.0–5.0)
HDL: 29 mg/dL — AB (ref >39)
LDL Calculated: 69 mg/dL (ref 0–99)
Triglycerides: 108 mg/dL (ref 0–149)
VLDL CHOLESTEROL CAL: 22 mg/dL (ref 5–40)

## 2014-06-02 LAB — CMP14+EGFR
ALBUMIN: 4.6 g/dL (ref 3.6–4.8)
ALK PHOS: 71 IU/L (ref 39–117)
ALT: 26 IU/L (ref 0–44)
AST: 31 IU/L (ref 0–40)
Albumin/Globulin Ratio: 2 (ref 1.1–2.5)
BILIRUBIN TOTAL: 0.4 mg/dL (ref 0.0–1.2)
BUN / CREAT RATIO: 10 (ref 10–22)
BUN: 12 mg/dL (ref 8–27)
CHLORIDE: 97 mmol/L (ref 97–108)
CO2: 23 mmol/L (ref 18–29)
Calcium: 9.2 mg/dL (ref 8.6–10.2)
Creatinine, Ser: 1.18 mg/dL (ref 0.76–1.27)
GFR calc Af Amer: 77 mL/min/{1.73_m2} (ref >59)
GFR calc non Af Amer: 67 mL/min/{1.73_m2} (ref >59)
Globulin, Total: 2.3 g/dL (ref 1.5–4.5)
Glucose: 139 mg/dL — ABNORMAL HIGH (ref 65–99)
Potassium: 4.3 mmol/L (ref 3.5–5.2)
Sodium: 136 mmol/L (ref 134–144)
Total Protein: 6.9 g/dL (ref 6.0–8.5)

## 2014-06-02 LAB — THYROID PANEL WITH TSH
Free Thyroxine Index: 1.5 (ref 1.2–4.9)
T3 Uptake Ratio: 22 % — ABNORMAL LOW (ref 24–39)
T4 TOTAL: 6.8 ug/dL (ref 4.5–12.0)
TSH: 2.4 u[IU]/mL (ref 0.450–4.500)

## 2014-08-30 ENCOUNTER — Telehealth: Payer: Self-pay | Admitting: Family

## 2014-08-30 NOTE — Telephone Encounter (Signed)
Continue current meds- and encourage low carb diet and exercise and recheck in 3 months

## 2014-08-31 ENCOUNTER — Encounter (INDEPENDENT_AMBULATORY_CARE_PROVIDER_SITE_OTHER): Payer: Self-pay

## 2014-08-31 ENCOUNTER — Encounter: Payer: Self-pay | Admitting: Family

## 2014-08-31 ENCOUNTER — Ambulatory Visit (INDEPENDENT_AMBULATORY_CARE_PROVIDER_SITE_OTHER): Payer: BLUE CROSS/BLUE SHIELD | Admitting: Family

## 2014-08-31 VITALS — BP 130/91 | HR 75 | Temp 97.7°F | Ht 68.0 in | Wt 241.8 lb

## 2014-08-31 DIAGNOSIS — E1165 Type 2 diabetes mellitus with hyperglycemia: Secondary | ICD-10-CM

## 2014-08-31 DIAGNOSIS — H1013 Acute atopic conjunctivitis, bilateral: Secondary | ICD-10-CM | POA: Diagnosis not present

## 2014-08-31 DIAGNOSIS — Z91013 Allergy to seafood: Secondary | ICD-10-CM | POA: Diagnosis not present

## 2014-08-31 DIAGNOSIS — I10 Essential (primary) hypertension: Secondary | ICD-10-CM | POA: Diagnosis not present

## 2014-08-31 DIAGNOSIS — E785 Hyperlipidemia, unspecified: Secondary | ICD-10-CM | POA: Diagnosis not present

## 2014-08-31 LAB — POCT GLYCOSYLATED HEMOGLOBIN (HGB A1C): HEMOGLOBIN A1C: 8

## 2014-08-31 MED ORDER — EPINEPHRINE 0.3 MG/0.3ML IJ SOAJ: INTRAMUSCULAR | Status: DC

## 2014-08-31 MED ORDER — OLOPATADINE HCL 0.2 % OP SOLN: 1.0000 [drp] | Freq: Every day | OPHTHALMIC | Status: DC

## 2014-08-31 NOTE — Patient Instructions (Addendum)
Allergic Conjunctivitis The conjunctiva is a thin membrane that covers the visible white part of the eyeball and the underside of the eyelids. This membrane protects and lubricates the eye. The membrane has small blood vessels running through it that can normally be seen. When the conjunctiva becomes inflamed, the condition is called conjunctivitis. In response to the inflammation, the conjunctival blood vessels become swollen. The swelling results in redness in the normally white part of the eye. The blood vessels of this membrane also react when a person has allergies and is then called allergic conjunctivitis. This condition usually lasts for as long as the allergy persists. Allergic conjunctivitis cannot be passed to another person (non-contagious). The likelihood of bacterial infection is great and the cause is not likely due to allergies if the inflamed eye has:  A sticky discharge.  Discharge or sticking together of the lids in the morning.  Scaling or flaking of the eyelids where the eyelashes come out.  Red swollen eyelids. CAUSES   Viruses.  Irritants such as foreign bodies.  Chemicals.  General allergic reactions.  Inflammation or serious diseases in the inside or the outside of the eye or the orbit (the boney cavity in which the eye sits) can cause a "red eye." SYMPTOMS   Eye redness.  Tearing.  Itchy eyes.  Burning feeling in the eyes.  Clear drainage from the eye.  Allergic reaction due to pollens or ragweed sensitivity. Seasonal allergic conjunctivitis is frequent in the spring when pollens are in the air and in the fall. DIAGNOSIS  This condition, in its many forms, is usually diagnosed based on the history and an ophthalmological exam. It usually involves both eyes. If your eyes react at the same time every year, allergies may be the cause. While most "red eyes" are due to allergy or an infection, the role of an eye (ophthalmological) exam is important. The exam  can rule out serious diseases of the eye or orbit. TREATMENT   Non-antibiotic eye drops, ointments, or medications by mouth may be prescribed if the ophthalmologist is sure the conjunctivitis is due to allergies alone.  Over-the-counter drops and ointments for allergic symptoms should be used only after other causes of conjunctivitis have been ruled out, or as your caregiver suggests. Medications by mouth are often prescribed if other allergy-related symptoms are present. If the ophthalmologist is sure that the conjunctivitis is due to allergies alone, treatment is normally limited to drops or ointments to reduce itching and burning. HOME CARE INSTRUCTIONS   Wash hands before and after applying drops or ointments, or touching the inflamed eye(s) or eyelids.  Do not let the eye dropper tip or ointment tube touch the eyelid when putting medicine in your eye.  Stop using your soft contact lenses and throw them away. Use a new pair of lenses when recovery is complete. You should run through sterilizing cycles at least three times before use after complete recovery if the old soft contact lenses are to be used. Hard contact lenses should be stopped. They need to be thoroughly sterilized before use after recovery.  Itching and burning eyes due to allergies is often relieved by using a cool cloth applied to closed eye(s). SEEK MEDICAL CARE IF:   Your problems do not go away after two or three days of treatment.  Your lids are sticky (especially in the morning when you wake up) or stick together.  Discharge develops. Antibiotics may be needed either as drops, ointment, or by mouth.  You   have extreme light sensitivity.  An oral temperature above 102 F (38.9 C) develops.  Pain in or around the eye or any other visual symptom develops. MAKE SURE YOU:   Understand these instructions.  Will watch your condition.  Will get help right away if you are not doing well or get worse. Document  Released: 06/16/2002 Document Revised: 06/18/2011 Document Reviewed: 05/12/2007 [facility] Patient Information 2015 [place], [place]. This information is not intended to replace advice given to you by your health care provider. Make sure you discuss any questions you have with your health care provider.  Health Maintenance A healthy lifestyle and preventative care can promote health and wellness.  Maintain regular health, dental, and eye exams.  Eat a healthy diet. Foods like vegetables, fruits, whole grains, low-fat dairy products, and lean protein foods contain the nutrients you need and are low in calories. Decrease your intake of foods high in solid fats, added sugars, and salt. Get information about a proper diet from your health care provider, if necessary.  Regular physical exercise is one of the most important things you can do for your health. Most adults should get at least 150 minutes of moderate-intensity exercise (any activity that increases your heart rate and causes you to sweat) each week. In addition, most adults need muscle-strengthening exercises on 2 or more days a week.   Maintain a healthy weight. The body mass index (BMI) is a screening tool to identify possible weight problems. It provides an estimate of body fat based on height and weight. Your health care provider can find your BMI and can help you achieve or maintain a healthy weight. For males 20 years and older:  A BMI below 18.5 is considered underweight.  A BMI of 18.5 to 24.9 is normal.  A BMI of 25 to 29.9 is considered overweight.  A BMI of 30 and above is considered obese.  Maintain normal blood lipids and cholesterol by exercising and minimizing your intake of saturated fat. Eat a balanced diet with plenty of fruits and vegetables. Blood tests for lipids and cholesterol should begin at age 92 and be repeated every 5 years. If your lipid or cholesterol levels are high, you are over age 29, or you are at high  risk for heart disease, you may need your cholesterol levels checked more frequently.Ongoing high lipid and cholesterol levels should be treated with medicines if diet and exercise are not working.  If you smoke, find out from your health care provider how to quit. If you do not use tobacco, do not start.  Lung cancer screening is recommended for adults aged 93-80 years who are at high risk for developing lung cancer because of a history of smoking. A yearly low-dose CT scan of the lungs is recommended for people who have at least a 30-pack-year history of smoking and are current smokers or have quit within the past 15 years. A pack year of smoking is smoking an average of 1 pack of cigarettes a day for 1 year (for example, a 30-pack-year history of smoking could mean smoking 1 pack a day for 30 years or 2 packs a day for 15 years). Yearly screening should continue until the smoker has stopped smoking for at least 15 years. Yearly screening should be stopped for people who develop a health problem that would prevent them from having lung cancer treatment.  If you choose to drink alcohol, do not have more than 2 drinks per day. One drink is considered to  be 12 oz (360 mL) of beer, 5 oz (150 mL) of wine, or 1.5 oz (45 mL) of liquor.  Avoid the use of street drugs. Do not share needles with anyone. Ask for help if you need support or instructions about stopping the use of drugs.  High blood pressure causes heart disease and increases the risk of stroke. Blood pressure should be checked at least every 1-2 years. Ongoing high blood pressure should be treated with medicines if weight loss and exercise are not effective.  If you are 42-45 years old, ask your health care provider if you should take aspirin to prevent heart disease.  Diabetes screening involves taking a blood sample to check your fasting blood sugar level. This should be done once every 3 years after age 68 if you are at a normal weight and  without risk factors for diabetes. Testing should be considered at a younger age or be carried out more frequently if you are overweight and have at least 1 risk factor for diabetes.  Colorectal cancer can be detected and often prevented. Most routine colorectal cancer screening begins at the age of 36 and continues through age 52. However, your health care provider may recommend screening at an earlier age if you have risk factors for colon cancer. On a yearly basis, your health care provider may provide home test kits to check for hidden blood in the stool. A small camera at the end of a tube may be used to directly examine the colon (sigmoidoscopy or colonoscopy) to detect the earliest forms of colorectal cancer. Talk to your health care provider about this at age 53 when routine screening begins. A direct exam of the colon should be repeated every 5-10 years through age 9, unless early forms of precancerous polyps or small growths are found.  People who are at an increased risk for hepatitis B should be screened for this virus. You are considered at high risk for hepatitis B if:  You were born in a country where hepatitis B occurs often. Talk with your health care provider about which countries are considered high risk.  Your parents were born in a high-risk country and you have not received a shot to protect against hepatitis B (hepatitis B vaccine).  You have HIV or AIDS.  You use needles to inject street drugs.  You live with, or have sex with, someone who has hepatitis B.  You are a man who has sex with other men (MSM).  You get hemodialysis treatment.  You take certain medicines for conditions like cancer, organ transplantation, and autoimmune conditions.  Hepatitis C blood testing is recommended for all people born from [birth year] through 1967 and any individual with known risk factors for hepatitis C.  Healthy men should no longer receive prostate-specific antigen (PSA) blood tests as  part of routine cancer screening. Talk to your health care provider about prostate cancer screening.  Testicular cancer screening is not recommended for adolescents or adult males who have no symptoms. Screening includes self-exam, a health care provider exam, and other screening tests. Consult with your health care provider about any symptoms you have or any concerns you have about testicular cancer.  Practice safe sex. Use condoms and avoid high-risk sexual practices to reduce the spread of sexually transmitted infections (STIs).  You should be screened for STIs, including gonorrhea and chlamydia if:  You are sexually active and are younger than 24 years.  You are older than 24 years, and your health care  provider tells you that you are at risk for this type of infection.  Your sexual activity has changed since you were last screened, and you are at an increased risk for chlamydia or gonorrhea. Ask your health care provider if you are at risk.  If you are at risk of being infected with HIV, it is recommended that you take a prescription medicine daily to prevent HIV infection. This is called pre-exposure prophylaxis (PrEP). You are considered at risk if:  You are a man who has sex with other men (MSM).  You are a heterosexual man who is sexually active with multiple partners.  You take drugs by injection.  You are sexually active with a partner who has HIV.  Talk with your health care provider about whether you are at high risk of being infected with HIV. If you choose to begin PrEP, you should first be tested for HIV. You should then be tested every 3 months for as long as you are taking PrEP.  Use sunscreen. Apply sunscreen liberally and repeatedly throughout the day. You should seek shade when your shadow is shorter than you. Protect yourself by wearing long sleeves, pants, a wide-brimmed hat, and sunglasses year round whenever you are outdoors.  Tell your health care provider of new  moles or changes in moles, especially if there is a change in shape or color. Also, tell your health care provider if a mole is larger than the size of a pencil eraser.  A one-time screening for abdominal aortic aneurysm (AAA) and surgical repair of large AAAs by ultrasound is recommended for men aged 23-75 years who are current or former smokers.  Stay current with your vaccines (immunizations). Document Released: 09/22/2007 Document Revised: 03/31/2013 Document Reviewed: 08/21/2010 [facility] Patient Information 2015 [place], [place]. This information is not intended to replace advice given to you by your health care provider. Make sure you discuss any questions you have with your health care provider.  Hiatal Hernia A hiatal hernia occurs when part of your stomach slides above the muscle that separates your abdomen from your chest (diaphragm). You can be born with a hiatal hernia (congenital), or it may develop over time. In almost all cases of hiatal hernia, only the top part of the stomach pushes through.  Many people have a hiatal hernia with no symptoms. The larger the hernia, the more likely that you will have symptoms. In some cases, a hiatal hernia allows stomach acid to flow back into the tube that carries food from your mouth to your stomach (esophagus). This may cause heartburn symptoms. Severe heartburn symptoms may mean you have developed a condition called gastroesophageal reflux disease (GERD).  CAUSES  Hiatal hernias are caused by a weakness in the opening (hiatus) where your esophagus passes through your diaphragm to attach to the upper part of your stomach. You may be born with a weakness in your hiatus, or a weakness can develop. RISK FACTORS Older age is a major risk factor for a hiatal hernia. Anything that increases pressure on your diaphragm can also increase your risk of a hiatal hernia. This includes:  Pregnancy.  Excess weight.  Frequent constipation. SIGNS AND SYMPTOMS    People with a hiatal hernia often have no symptoms. If symptoms develop, they are almost always caused by GERD. They may include:  Heartburn.  Belching.  Indigestion.  Trouble swallowing.  Coughing or wheezing.  Sore throat.  Hoarseness.  Chest pain. DIAGNOSIS  A hiatal hernia is sometimes found during an  exam for another problem. Your health care provider may suspect a hiatal hernia if you have symptoms of GERD. Tests may be done to diagnose GERD. These may include:  X-rays of your stomach or chest.  An upper gastrointestinal (GI) series. This is an X-ray exam of your GI tract involving the use of a chalky liquid that you swallow. The liquid shows up clearly on the X-ray.  Endoscopy. This is a procedure to look into your stomach using a thin, flexible tube that has a tiny camera and light on the end of it. TREATMENT  If you have no symptoms, you may not need treatment. If you have symptoms, treatment may include:  Dietary and lifestyle changes to help reduce GERD symptoms.  Medicines. These may include:  Over-the-counter antacids.  Medicines that make your stomach empty more quickly.  Medicines that block the production of stomach acid (H2 blockers).  Stronger medicines to reduce stomach acid (proton pump inhibitors).  You may need surgery to repair the hernia if other treatments are not helping. HOME CARE INSTRUCTIONS   Take all medicines as directed by your health care provider.  Quit smoking, if you smoke.  Try to achieve and maintain a healthy body weight.  Eat frequent small meals instead of three large meals a day. This keeps your stomach from getting too full.  Eat slowly.  Do not lie down right after eating.  Do noteat 1-2 hours before bed.   Do not drink beverages with caffeine. These include cola, coffee, cocoa, and tea.  Do not drink alcohol.  Avoid foods that can make symptoms of GERD worse. These may include:  Fatty foods.  Citrus  fruits.  Other foods and drinks that contain acid.  Avoid putting pressure on your belly. Anything that puts pressure on your belly increases the amount of acid that may be pushed up into your esophagus.   Avoid bending over, especially after eating.  Raise the head of your bed by putting blocks under the legs. This keeps your head and esophagus higher than your stomach.  Do not wear tight clothing around your chest or stomach.  Try not to strain when having a bowel movement, when urinating, or when lifting heavy objects. SEEK MEDICAL CARE IF:  Your symptoms are not controlled with medicines or lifestyle changes.  You are having trouble swallowing.  You have coughing or wheezing that will not go away. SEEK IMMEDIATE MEDICAL CARE IF:  Your pain is getting worse.  Your pain spreads to your arms, neck, jaw, teeth, or back.  You have shortness of breath.  You sweat for no reason.  You feel sick to your stomach (nauseous) or vomit.  You vomit blood.  You have bright red blood in your stools.  You have black, tarry stools.  Document Released: 06/16/2003 Document Revised: 08/10/2013 Document Reviewed: 03/13/2013 [facility] Patient Information 2015 [place], [place]. This information is not intended to replace advice given to you by your health care provider. Make sure you discuss any questions you have with your health care provider.

## 2014-08-31 NOTE — Addendum Note (Signed)
Addended by: Earlene Plater on: 08/31/2014 08:48 AM   Modules accepted: Miquel Dunn

## 2014-08-31 NOTE — Progress Notes (Signed)
Subjective:    Patient ID: [name], male    DOB: Nov 14, 1955, 59 y.o.   MRN: [id number]  Diabetes He presents for his follow-up diabetic visit. He has type 2 diabetes mellitus. His disease course has been improving. Pertinent negatives for hypoglycemia include no confusion, dizziness or headaches. Pertinent negatives for diabetes include no blurred vision, no foot paresthesias, no foot ulcerations and no visual change. Pertinent negatives for hypoglycemia complications include no blackouts. Symptoms are worsening. Pertinent negatives for diabetic complications include no CVA, heart disease, nephropathy or peripheral neuropathy. Risk factors for coronary artery disease include diabetes mellitus, dyslipidemia, hypertension, male sex and family history. Current diabetic treatment includes oral agent (monotherapy). He is compliant with treatment all of the time. He is following a generally healthy diet. His breakfast blood glucose range is generally 140-180 mg/dl. An ACE inhibitor/angiotensin II receptor blocker is being taken. Eye exam is current.  Hypertension This is a chronic problem. The current episode started more than 1 year ago. The problem is unchanged. The problem is uncontrolled. Pertinent negatives include no blurred vision, headaches, palpitations or peripheral edema. Risk factors for coronary artery disease include diabetes mellitus, dyslipidemia, family history, male gender and obesity. Past treatments include angiotensin blockers. The current treatment provides mild improvement. There is no history of kidney disease, CAD/MI, CVA, heart failure or a thyroid problem. There is no history of sleep apnea.  Hyperlipidemia This is a chronic problem. The current episode started more than 1 year ago. The problem is uncontrolled. Recent lipid tests were reviewed and are high. Exacerbating diseases include diabetes and obesity. He has no history of hypothyroidism. Factors aggravating his  hyperlipidemia include fatty foods. Associated symptoms include leg pain. Pertinent negatives include no myalgias. Current antihyperlipidemic treatment includes statins. The current treatment provides moderate improvement of lipids. Risk factors for coronary artery disease include diabetes mellitus, dyslipidemia, family history, male sex and obesity.      Review of Systems  Constitutional: Negative.   HENT: Negative.   Eyes: Negative for blurred vision.  Respiratory: Negative.   Cardiovascular: Negative.  Negative for palpitations.  Gastrointestinal: Negative.   Endocrine: Negative.   Genitourinary: Negative.   Musculoskeletal: Negative.  Negative for myalgias.  Neurological: Negative.  Negative for dizziness and headaches.  Hematological: Negative.   Psychiatric/Behavioral: Negative.  Negative for confusion.  All other systems reviewed and are negative.      Objective:   Physical Exam  Constitutional: He is oriented to person, place, and time. He appears well-developed and well-nourished. No distress.  HENT:  Head: Normocephalic.  Right Ear: External ear normal.  Left Ear: External ear normal.  Nose: Nose normal.  Mouth/Throat: Oropharynx is clear and moist.  Eyes: Pupils are equal, round, and reactive to light. Right eye exhibits no discharge. Left eye exhibits no discharge.  Neck: Normal range of motion. Neck supple. No thyromegaly present.  Cardiovascular: Normal rate, regular rhythm, normal heart sounds and intact distal pulses.   No murmur heard. Pulmonary/Chest: Effort normal and breath sounds normal. No respiratory distress. He has no wheezes.  Abdominal: Soft. Bowel sounds are normal. He exhibits no distension. There is no tenderness.  Musculoskeletal: Normal range of motion. He exhibits no edema or tenderness.  Neurological: He is alert and oriented to person, place, and time. He has normal reflexes. No cranial nerve deficit.  Skin: Skin is warm and dry. No rash  noted. No erythema.  Psychiatric: He has a normal mood and affect. His behavior is normal. Judgment  and thought content normal.  Vitals reviewed.     BP 130/91 mmHg  Pulse 75  Temp(Src) 97.7 F (36.5 C) (Oral)  Ht 5' 8"  (1.727 m)  Wt 241 lb 12.8 oz (109.68 kg)  BMI 36.77 kg/m2     Assessment & Plan:  1. Essential hypertension - CMP14+EGFR  2. Type 2 diabetes mellitus with hyperglycemia - POCT glycosylated hemoglobin (Hb A1C) - CMP14+EGFR  3. Hyperlipidemia - CMP14+EGFR - Lipid panel  4. Shellfish allergy - CMP14+EGFR - EPINEPHrine (EPIPEN 2-PAK) 0.3 mg/0.3 mL IJ SOAJ injection; USE AS DIRECTED AS NEEDED  Dispense: 2 Device; Refill: 1  5. Allergic conjunctivitis, bilateral -Continue daily Claritin - Olopatadine HCl (PATADAY) 0.2 % SOLN; Apply 1 drop to eye daily.  Dispense: 2.5 mL; Refill: 6    Continue all meds Labs pending Health Maintenance reviewed Diet and exercise encouraged RTO 3 month  Evelina Dun, FNP

## 2014-09-01 ENCOUNTER — Other Ambulatory Visit: Payer: Self-pay | Admitting: Family

## 2014-09-01 LAB — CMP14+EGFR
ALT: 23 IU/L (ref 0–44)
AST: 21 IU/L (ref 0–40)
Albumin/Globulin Ratio: 1.8 (ref 1.1–2.5)
Albumin: 4.6 g/dL (ref 3.6–4.8)
Alkaline Phosphatase: 77 IU/L (ref 39–117)
BUN/Creatinine Ratio: 16 (ref 10–22)
BUN: 18 mg/dL (ref 8–27)
Bilirubin Total: 0.3 mg/dL (ref 0.0–1.2)
CHLORIDE: 99 mmol/L (ref 97–108)
CO2: 22 mmol/L (ref 18–29)
Calcium: 9.3 mg/dL (ref 8.6–10.2)
Creatinine, Ser: 1.11 mg/dL (ref 0.76–1.27)
GFR, EST AFRICAN AMERICAN: 82 mL/min/{1.73_m2} (ref >59)
GFR, EST NON AFRICAN AMERICAN: 71 mL/min/{1.73_m2} (ref >59)
GLOBULIN, TOTAL: 2.5 g/dL (ref 1.5–4.5)
Glucose: 159 mg/dL — ABNORMAL HIGH (ref 65–99)
Potassium: 5.2 mmol/L (ref 3.5–5.2)
Sodium: 139 mmol/L (ref 134–144)
Total Protein: 7.1 g/dL (ref 6.0–8.5)

## 2014-09-01 LAB — LIPID PANEL
Chol/HDL Ratio: 3 ratio units (ref 0.0–5.0)
Cholesterol, Total: 124 mg/dL (ref 100–199)
HDL: 41 mg/dL (ref >39)
LDL Calculated: 67 mg/dL (ref 0–99)
TRIGLYCERIDES: 79 mg/dL (ref 0–149)
VLDL CHOLESTEROL CAL: 16 mg/dL (ref 5–40)

## 2014-09-01 MED ORDER — CANAGLIFLOZIN 100 MG PO TABS: 100.0000 mg | ORAL_TABLET | Freq: Every day | ORAL | Status: DC

## 2014-09-02 ENCOUNTER — Telehealth: Payer: Self-pay | Admitting: *Deleted

## 2014-09-02 NOTE — Telephone Encounter (Signed)
-----   Message from Sharion Balloon, [place] sent at 09/01/2014  9:02 AM EDT ----- HgbA1C elevated- Pt needs to be on low carb diet, Invokana 100 mg rx sent to pharmacy- Pt needs to follow up in 3 months Kidney and liver function stable Cholesterol levels WNL

## 2014-09-07 ENCOUNTER — Telehealth: Payer: Self-pay | Admitting: Family

## 2014-09-07 NOTE — Telephone Encounter (Signed)
Discussed need for new diabetes medication but patient does not seem very interested in starting.

## 2014-09-09 ENCOUNTER — Telehealth: Payer: Self-pay

## 2014-09-09 MED ORDER — OLOPATADINE HCL 0.1 % OP SOLN: 1.0000 [drp] | Freq: Two times a day (BID) | OPHTHALMIC | Status: DC

## 2014-09-09 NOTE — Telephone Encounter (Signed)
Insurance denied prior authorization for Pataday Drops  Needs to try generic azelastine opthalmic solution, generic epinastine opthalmic solutions or generic olopatadine 0.1% opthalmic solution

## 2014-10-12 ENCOUNTER — Other Ambulatory Visit: Payer: Self-pay | Admitting: Cardiovascular Disease

## 2014-10-15 ENCOUNTER — Other Ambulatory Visit: Payer: Self-pay | Admitting: *Deleted

## 2014-10-15 DIAGNOSIS — I10 Essential (primary) hypertension: Secondary | ICD-10-CM

## 2014-10-15 MED ORDER — LOSARTAN POTASSIUM 100 MG PO TABS: 50.0000 mg | ORAL_TABLET | Freq: Every day | ORAL | Status: DC

## 2014-12-09 ENCOUNTER — Encounter (INDEPENDENT_AMBULATORY_CARE_PROVIDER_SITE_OTHER): Payer: Self-pay

## 2014-12-09 ENCOUNTER — Encounter: Payer: Self-pay | Admitting: Family

## 2014-12-09 ENCOUNTER — Ambulatory Visit (INDEPENDENT_AMBULATORY_CARE_PROVIDER_SITE_OTHER): Payer: BLUE CROSS/BLUE SHIELD | Admitting: Family

## 2014-12-09 VITALS — BP 154/94 | HR 71 | Temp 97.5°F | Ht 68.0 in | Wt 246.0 lb

## 2014-12-09 DIAGNOSIS — I10 Essential (primary) hypertension: Secondary | ICD-10-CM

## 2014-12-09 DIAGNOSIS — E669 Obesity, unspecified: Secondary | ICD-10-CM

## 2014-12-09 DIAGNOSIS — E785 Hyperlipidemia, unspecified: Secondary | ICD-10-CM

## 2014-12-09 DIAGNOSIS — E1165 Type 2 diabetes mellitus with hyperglycemia: Secondary | ICD-10-CM | POA: Diagnosis not present

## 2014-12-09 DIAGNOSIS — M069 Rheumatoid arthritis, unspecified: Secondary | ICD-10-CM | POA: Diagnosis not present

## 2014-12-09 DIAGNOSIS — Z1159 Encounter for screening for other viral diseases: Secondary | ICD-10-CM | POA: Diagnosis not present

## 2014-12-09 DIAGNOSIS — Z125 Encounter for screening for malignant neoplasm of prostate: Secondary | ICD-10-CM

## 2014-12-09 DIAGNOSIS — Z91013 Allergy to seafood: Secondary | ICD-10-CM | POA: Diagnosis not present

## 2014-12-09 LAB — POCT UA - MICROALBUMIN: MICROALBUMIN (UR) POC: 20 mg/L

## 2014-12-09 LAB — POCT GLYCOSYLATED HEMOGLOBIN (HGB A1C): Hemoglobin A1C: 6.6

## 2014-12-09 MED ORDER — EPINEPHRINE 0.3 MG/0.3ML IJ SOAJ: INTRAMUSCULAR | Status: DC

## 2014-12-09 NOTE — Progress Notes (Signed)
Subjective:    Patient ID: [name], male    DOB: 03-Oct-1955, 59 y.o.   MRN: [id number]  Pt presents to the office today for chronic follow up.  Diabetes He presents for his follow-up diabetic visit. He has type 2 diabetes mellitus. His disease course has been improving. Pertinent negatives for hypoglycemia include no confusion, dizziness or headaches. Pertinent negatives for diabetes include no blurred vision, no foot paresthesias, no foot ulcerations and no visual change. Pertinent negatives for hypoglycemia complications include no blackouts. Symptoms are worsening. Pertinent negatives for diabetic complications include no CVA, heart disease, nephropathy or peripheral neuropathy. Risk factors for coronary artery disease include diabetes mellitus, dyslipidemia, hypertension, male sex and family history. Current diabetic treatment includes oral agent (monotherapy). He is compliant with treatment all of the time. He is following a generally healthy diet. His breakfast blood glucose range is generally 140-180 mg/dl. An ACE inhibitor/angiotensin II receptor blocker is being taken. Eye exam is not current.  Hypertension This is a chronic problem. The current episode started more than 1 year ago. The problem is unchanged. The problem is uncontrolled. Pertinent negatives include no blurred vision, headaches, palpitations or peripheral edema. Risk factors for coronary artery disease include diabetes mellitus, dyslipidemia, family history, male gender and obesity. Past treatments include angiotensin blockers. The current treatment provides mild improvement. There is no history of kidney disease, CAD/MI, CVA, heart failure or a thyroid problem. There is no history of sleep apnea.  Hyperlipidemia This is a chronic problem. The current episode started more than 1 year ago. The problem is uncontrolled. Recent lipid tests were reviewed and are high. Exacerbating diseases include diabetes and obesity. He has no  history of hypothyroidism. Factors aggravating his hyperlipidemia include fatty foods. Associated symptoms include leg pain. Pertinent negatives include no myalgias. Current antihyperlipidemic treatment includes statins. The current treatment provides moderate improvement of lipids. Risk factors for coronary artery disease include diabetes mellitus, dyslipidemia, family history, male sex and obesity.      Review of Systems  Constitutional: Negative.   HENT: Negative.   Eyes: Negative for blurred vision.  Respiratory: Negative.   Cardiovascular: Negative.  Negative for palpitations.  Gastrointestinal: Negative.   Endocrine: Negative.   Genitourinary: Negative.   Musculoskeletal: Negative.  Negative for myalgias.  Neurological: Negative.  Negative for dizziness and headaches.  Hematological: Negative.   Psychiatric/Behavioral: Negative.  Negative for confusion.  All other systems reviewed and are negative.      Objective:   Physical Exam  Constitutional: He is oriented to person, place, and time. He appears well-developed and well-nourished. No distress.  HENT:  Head: Normocephalic.  Right Ear: External ear normal.  Left Ear: External ear normal.  Mouth/Throat: Oropharynx is clear and moist.  Eyes: Pupils are equal, round, and reactive to light. Right eye exhibits no discharge. Left eye exhibits no discharge.  Neck: Normal range of motion. Neck supple. No thyromegaly present.  Cardiovascular: Normal rate, regular rhythm, normal heart sounds and intact distal pulses.   No murmur heard. Pulmonary/Chest: Effort normal and breath sounds normal. No respiratory distress. He has no wheezes.  Abdominal: Soft. Bowel sounds are normal. He exhibits no distension. There is no tenderness.  Musculoskeletal: Normal range of motion. He exhibits no edema or tenderness.  Neurological: He is alert and oriented to person, place, and time. He has normal reflexes. No cranial nerve deficit.  Skin: Skin  is warm and dry. No rash noted. No erythema.  Psychiatric: He has a normal  mood and affect. His behavior is normal. Judgment and thought content normal.  Vitals reviewed.  See Diabetic foot note   BP 154/94 mmHg  Pulse 71  Temp(Src) 97.5 F (36.4 C) (Oral)  Ht 5' 8"  (1.727 m)  Wt 246 lb (111.585 kg)  BMI 37.41 kg/m2     Assessment & Plan:  1. Essential hypertension -PT states he gets infusions at the [facility]  once a month and states his BP is always 130's/70's Pt states he has appt with Cardiologists in Nov and does not want to change medication until then -Dash diet information given -Exercise encouraged - Stress Management  -Continue current meds  CMP14+EGFR  2. Type 2 diabetes mellitus with hyperglycemia - CMP14+EGFR - POCT glycosylated hemoglobin (Hb A1C) - POCT UA - Microalbumin - Ambulatory referral to Ophthalmology  3. Rheumatoid arthritis - CMP14+EGFR  4. Hyperlipidemia - CMP14+EGFR - Lipid panel  5. Obesity - CMP14+EGFR  6. Prostate cancer screening - CMP14+EGFR - PSA, total and free  7. Shellfish allergy - CMP14+EGFR - EPINEPHrine (EPIPEN 2-PAK) 0.3 mg/0.3 mL IJ SOAJ injection; USE AS DIRECTED AS NEEDED  Dispense: 2 Device; Refill: 1  8. Need for hepatitis C screening test - Hepatitis C antibody   Continue all meds Labs pending Health Maintenance reviewed Diet and exercise encouraged RTO 3 months  Evelina Dun, FNP

## 2014-12-09 NOTE — Patient Instructions (Addendum)
Health Maintenance A healthy lifestyle and preventative care can promote health and wellness.  Maintain regular health, dental, and eye exams.  Eat a healthy diet. Foods like vegetables, fruits, whole grains, low-fat dairy products, and lean protein foods contain the nutrients you need and are low in calories. Decrease your intake of foods high in solid fats, added sugars, and salt. Get information about a proper diet from your health care provider, if necessary.  Regular physical exercise is one of the most important things you can do for your health. Most adults should get at least 150 minutes of moderate-intensity exercise (any activity that increases your heart rate and causes you to sweat) each week. In addition, most adults need muscle-strengthening exercises on 2 or more days a week.   Maintain a healthy weight. The body mass index (BMI) is a screening tool to identify possible weight problems. It provides an estimate of body fat based on height and weight. Your health care provider can find your BMI and can help you achieve or maintain a healthy weight. For males 20 years and older:  A BMI below 18.5 is considered underweight.  A BMI of 18.5 to 24.9 is normal.  A BMI of 25 to 29.9 is considered overweight.  A BMI of 30 and above is considered obese.  Maintain normal blood lipids and cholesterol by exercising and minimizing your intake of saturated fat. Eat a balanced diet with plenty of fruits and vegetables. Blood tests for lipids and cholesterol should begin at age 20 and be repeated every 5 years. If your lipid or cholesterol levels are high, you are over age 50, or you are at high risk for heart disease, you may need your cholesterol levels checked more frequently.Ongoing high lipid and cholesterol levels should be treated with medicines if diet and exercise are not working.  If you smoke, find out from your health care provider how to quit. If you do not use tobacco, do not  start.  Lung cancer screening is recommended for adults aged 55-80 years who are at high risk for developing lung cancer because of a history of smoking. A yearly low-dose CT scan of the lungs is recommended for people who have at least a 30-pack-year history of smoking and are current smokers or have quit within the past 15 years. A pack year of smoking is smoking an average of 1 pack of cigarettes a day for 1 year (for example, a 30-pack-year history of smoking could mean smoking 1 pack a day for 30 years or 2 packs a day for 15 years). Yearly screening should continue until the smoker has stopped smoking for at least 15 years. Yearly screening should be stopped for people who develop a health problem that would prevent them from having lung cancer treatment.  If you choose to drink alcohol, do not have more than 2 drinks per day. One drink is considered to be 12 oz (360 mL) of beer, 5 oz (150 mL) of wine, or 1.5 oz (45 mL) of liquor.  Avoid the use of street drugs. Do not share needles with anyone. Ask for help if you need support or instructions about stopping the use of drugs.  High blood pressure causes heart disease and increases the risk of stroke. Blood pressure should be checked at least every 1-2 years. Ongoing high blood pressure should be treated with medicines if weight loss and exercise are not effective.  If you are 45-79 years old, ask your health care provider if   you should take aspirin to prevent heart disease.  Diabetes screening involves taking a blood sample to check your fasting blood sugar level. This should be done once every 3 years after age 45 if you are at a normal weight and without risk factors for diabetes. Testing should be considered at a younger age or be carried out more frequently if you are overweight and have at least 1 risk factor for diabetes.  Colorectal cancer can be detected and often prevented. Most routine colorectal cancer screening begins at the age of 50  and continues through age 75. However, your health care provider may recommend screening at an earlier age if you have risk factors for colon cancer. On a yearly basis, your health care provider may provide home test kits to check for hidden blood in the stool. A small camera at the end of a tube may be used to directly examine the colon (sigmoidoscopy or colonoscopy) to detect the earliest forms of colorectal cancer. Talk to your health care provider about this at age 50 when routine screening begins. A direct exam of the colon should be repeated every 5-10 years through age 75, unless early forms of precancerous polyps or small growths are found.  People who are at an increased risk for hepatitis B should be screened for this virus. You are considered at high risk for hepatitis B if:  You were born in a country where hepatitis B occurs often. Talk with your health care provider about which countries are considered high risk.  Your parents were born in a high-risk country and you have not received a shot to protect against hepatitis B (hepatitis B vaccine).  You have HIV or AIDS.  You use needles to inject street drugs.  You live with, or have sex with, someone who has hepatitis B.  You are a man who has sex with other men (MSM).  You get hemodialysis treatment.  You take certain medicines for conditions like cancer, organ transplantation, and autoimmune conditions.  Hepatitis C blood testing is recommended for all people born from 1945 through 1965 and any individual with known risk factors for hepatitis C.  Healthy men should no longer receive prostate-specific antigen (PSA) blood tests as part of routine cancer screening. Talk to your health care provider about prostate cancer screening.  Testicular cancer screening is not recommended for adolescents or adult males who have no symptoms. Screening includes self-exam, a health care provider exam, and other screening tests. Consult with your  health care provider about any symptoms you have or any concerns you have about testicular cancer.  Practice safe sex. Use condoms and avoid high-risk sexual practices to reduce the spread of sexually transmitted infections (STIs).  You should be screened for STIs, including gonorrhea and chlamydia if:  You are sexually active and are younger than 24 years.  You are older than 24 years, and your health care provider tells you that you are at risk for this type of infection.  Your sexual activity has changed since you were last screened, and you are at an increased risk for chlamydia or gonorrhea. Ask your health care provider if you are at risk.  If you are at risk of being infected with HIV, it is recommended that you take a prescription medicine daily to prevent HIV infection. This is called pre-exposure prophylaxis (PrEP). You are considered at risk if:  You are a man who has sex with other men (MSM).  You are a heterosexual man who   is sexually active with multiple partners.  You take drugs by injection.  You are sexually active with a partner who has HIV.  Talk with your health care provider about whether you are at high risk of being infected with HIV. If you choose to begin PrEP, you should first be tested for HIV. You should then be tested every 3 months for as long as you are taking PrEP.  Use sunscreen. Apply sunscreen liberally and repeatedly throughout the day. You should seek shade when your shadow is shorter than you. Protect yourself by wearing long sleeves, pants, a wide-brimmed hat, and sunglasses year round whenever you are outdoors.  Tell your health care provider of new moles or changes in moles, especially if there is a change in shape or color. Also, tell your health care provider if a mole is larger than the size of a pencil eraser.  A one-time screening for abdominal aortic aneurysm (AAA) and surgical repair of large AAAs by ultrasound is recommended for men aged  65-75 years who are current or former smokers.  Stay current with your vaccines (immunizations). Document Released: 09/22/2007 Document Revised: 03/31/2013 Document Reviewed: 08/21/2010 Exit[employer] Patient Information 2015 [employer]. This information is not intended to replace advice given to you by your health care provider. Make sure you discuss any questions you have with your health care provider.  Hypertension Hypertension, commonly called high blood pressure, is when the force of blood pumping through your arteries is too strong. Your arteries are the blood vessels that carry blood from your heart throughout your body. A blood pressure reading consists of a higher number over a lower number, such as 110/72. The higher number (systolic) is the pressure inside your arteries when your heart pumps. The lower number (diastolic) is the pressure inside your arteries when your heart relaxes. Ideally you want your blood pressure below 120/80. Hypertension forces your heart to work harder to pump blood. Your arteries may become narrow or stiff. Having hypertension puts you at risk for heart disease, stroke, and other problems.  RISK FACTORS Some risk factors for high blood pressure are controllable. Others are not.  Risk factors you cannot control include:   Race. You may be at higher risk if you are African American.  Age. Risk increases with age.  Gender. Men are at higher risk than women before age 45 years. After age 65, women are at higher risk than men. Risk factors you can control include:  Not getting enough exercise or physical activity.  Being overweight.  Getting too much fat, sugar, calories, or salt in your diet.  Drinking too much alcohol. SIGNS AND SYMPTOMS Hypertension does not usually cause signs or symptoms. Extremely high blood pressure (hypertensive crisis) may cause headache, anxiety, shortness of breath, and nosebleed. DIAGNOSIS  To check if you have hypertension,  your health care provider will measure your blood pressure while you are seated, with your arm held at the level of your heart. It should be measured at least twice using the same arm. Certain conditions can cause a difference in blood pressure between your right and left arms. A blood pressure reading that is higher than normal on one occasion does not mean that you need treatment. If one blood pressure reading is high, ask your health care provider about having it checked again. TREATMENT  Treating high blood pressure includes making lifestyle changes and possibly taking medicine. Living a healthy lifestyle can help lower high blood pressure. You may need to change   some of your habits. Lifestyle changes may include:  Following the DASH diet. This diet is high in fruits, vegetables, and whole grains. It is low in salt, red meat, and added sugars.  Getting at least 2 hours of brisk physical activity every week.  Losing weight if necessary.  Not smoking.  Limiting alcoholic beverages.  Learning ways to reduce stress. If lifestyle changes are not enough to get your blood pressure under control, your health care provider may prescribe medicine. You may need to take more than one. Work closely with your health care provider to understand the risks and benefits. HOME CARE INSTRUCTIONS  Have your blood pressure rechecked as directed by your health care provider.   Take medicines only as directed by your health care provider. Follow the directions carefully. Blood pressure medicines must be taken as prescribed. The medicine does not work as well when you skip doses. Skipping doses also puts you at risk for problems.   Do not smoke.   Monitor your blood pressure at home as directed by your health care provider. SEEK MEDICAL CARE IF:   You think you are having a reaction to medicines taken.  You have recurrent headaches or feel dizzy.  You have swelling in your ankles.  You have  trouble with your vision. SEEK IMMEDIATE MEDICAL CARE IF:  You develop a severe headache or confusion.  You have unusual weakness, numbness, or feel faint.  You have severe chest or abdominal pain.  You vomit repeatedly.  You have trouble breathing. MAKE SURE YOU:   Understand these instructions.  Will watch your condition.  Will get help right away if you are not doing well or get worse. Document Released: 03/26/2005 Document Revised: 08/10/2013 Document Reviewed: 01/16/2013 Exit[employer] Patient Information 2015 [employer]. This information is not intended to replace advice given to you by your health care provider. Make sure you discuss any questions you have with your health care provider.  

## 2014-12-09 NOTE — Addendum Note (Signed)
Addended by: Earlene Plater on: 12/09/2014 09:23 AM   Modules accepted: Orders

## 2014-12-10 LAB — PSA, TOTAL AND FREE
PSA, Free Pct: 26 %
PSA, Free: 0.13 ng/mL
Prostate Specific Ag, Serum: 0.5 ng/mL (ref 0.0–4.0)

## 2014-12-10 LAB — CMP14+EGFR
A/G RATIO: 1.8 (ref 1.1–2.5)
ALK PHOS: 79 IU/L (ref 39–117)
ALT: 21 IU/L (ref 0–44)
AST: 22 IU/L (ref 0–40)
Albumin: 4.4 g/dL (ref 3.6–4.8)
BUN/Creatinine Ratio: 17 (ref 10–22)
BUN: 18 mg/dL (ref 8–27)
Bilirubin Total: 0.3 mg/dL (ref 0.0–1.2)
CALCIUM: 9.5 mg/dL (ref 8.6–10.2)
CHLORIDE: 100 mmol/L (ref 97–108)
CO2: 22 mmol/L (ref 18–29)
Creatinine, Ser: 1.03 mg/dL (ref 0.76–1.27)
GFR calc Af Amer: 90 mL/min/{1.73_m2} (ref >59)
GFR, EST NON AFRICAN AMERICAN: 78 mL/min/{1.73_m2} (ref >59)
Globulin, Total: 2.5 g/dL (ref 1.5–4.5)
Glucose: 140 mg/dL — ABNORMAL HIGH (ref 65–99)
POTASSIUM: 4.6 mmol/L (ref 3.5–5.2)
Sodium: 139 mmol/L (ref 134–144)
Total Protein: 6.9 g/dL (ref 6.0–8.5)

## 2014-12-10 LAB — MICROALBUMIN, URINE: MICROALBUM., U, RANDOM: 11.4 ug/mL

## 2014-12-10 LAB — LIPID PANEL
CHOL/HDL RATIO: 3.3 ratio (ref 0.0–5.0)
CHOLESTEROL TOTAL: 127 mg/dL (ref 100–199)
HDL: 38 mg/dL — AB (ref >39)
LDL Calculated: 75 mg/dL (ref 0–99)
TRIGLYCERIDES: 69 mg/dL (ref 0–149)
VLDL Cholesterol Cal: 14 mg/dL (ref 5–40)

## 2014-12-10 LAB — HEPATITIS C ANTIBODY: Hep C Virus Ab: <0.1 s/co ratio (ref 0.0–0.9)

## 2014-12-16 ENCOUNTER — Telehealth: Payer: Self-pay | Admitting: Family

## 2015-01-14 ENCOUNTER — Other Ambulatory Visit: Payer: Self-pay | Admitting: Cardiovascular Disease

## 2015-01-14 NOTE — Telephone Encounter (Signed)
Josue Hector, MD at 11/11/2013 9:32 AM  losartan (COZAAR) 100 MG tabletTake 50 mg by mouth daily Your physician recommends that you continue on your current medications as directed. Please refer to the Current Medication list given to you today.

## 2015-02-07 ENCOUNTER — Encounter: Payer: Self-pay | Admitting: *Deleted

## 2015-02-09 NOTE — Progress Notes (Signed)
Patient ID: [name], male   DOB: 1955-06-05, 59 y.o.   MRN: [id number] [name] is seen today in F/U of CRF's. 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 I reviewed his risk factor profile from [facility]. His HDL was low at 34. He was given samples of Niaspan by Dr [name] and I told him he should try them He will take a coated aspirin 30 minutes prior to . His weight is still up and he needs to focus on a more low carb diet. He eats out a lot and has large portions. He has seen a nutrutionist and is working on this. He denies recurrent SSCP. He has lower back problems and complains about his legs aching in the morning. He has been compliant with his meds. He quit smoking in 1998. He is sedentary and still overweight with a BMI of 37.4. 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   Reviewed lab work from [facility] 102 HDL 39 LDL 75  A1c up to 6.8  Discussed exercise and weight loss strategies Has seen nutritionist at [employer] for disability for artthritis Getting iv Rx  Mostly in upper body  Only taking 1/2 tablet of cozaar  Mother in law past last week  Reviewed labs from [facility] 12/21/14   Cr 1.03 K 4.6 LDL 75  PSA .5 TSH normal  ROS: Denies fever, malais, weight loss, blurry vision, decreased visual acuity, cough, sputum, SOB, hemoptysis, pleuritic pain, palpitaitons, heartburn, abdominal pain, melena, lower extremity edema, claudication, or rash.  All other systems reviewed and negative  General: Affect appropriate Obese white male  HEENT: normal Neck supple with no adenopathy JVP normal no bruits no thyromegaly Lungs clear with no wheezing and good diaphragmatic motion Heart:  S1/S2 no murmur, no rub, gallop or click PMI normal Abdomen: benighn, BS positve, no tenderness, no AAA no bruit.  No HSM or HJR Distal pulses intact with no bruits No edema Neuro non-focal Skin warm and dry No muscular weakness   Current Outpatient  Prescriptions  Medication Sig Dispense Refill  . abatacept in sodium chloride 0.9 % Inject into the vein every 30 (thirty) days.    [name] aspirin 81 MG chewable tablet Chew 81 mg by mouth daily.      [name] EPINEPHrine (EPIPEN 2-PAK) 0.3 mg/0.3 mL IJ SOAJ injection USE AS DIRECTED AS NEEDED 2 Device 1  . losartan (COZAAR) 100 MG tablet Take 1 tablet (100 mg total) by mouth daily. 90 tablet 3  . meloxicam (MOBIC) 15 MG tablet Take 15 mg by mouth daily.    . metFORMIN (GLUCOPHAGE) 1000 MG tablet Take 1 tablet (1,000 mg total) by mouth 2 (two) times daily with a meal. 180 tablet 4  . oxyCODONE-acetaminophen (PERCOCET) 10-325 MG tablet Take 2 tablets by mouth daily as needed. For pain    . simvastatin (ZOCOR) 10 MG tablet Take 10 mg by mouth daily.    [name] triamcinolone cream (KENALOG) 0.1 % Apply 1 application topically daily as needed (for rash).     No current facility-administered medications for this visit.    Allergies  Shellfish allergy; Invokana; and Keflex  Electrocardiogram:  SR rate 62 T wave inversion Lead 3   Assessment and Plan HTN: Increase cozaar to full tablet daily MG:[id number] low carb diet.  Target hemoglobin A1c is 6.5 or less.  Continue current medications. Arthritis:continue monthly abatacept with rheum Chol:  Cholesterol is  at goal.  Continue current dose of statin and diet Rx.  No myalgias or side effects.  F/U  LFT's in 6 months. Lab Results  Component Value Date   LDLCALC 75 12/09/2014            Increased cozaar to 100 mg F/u with me in a year    [name]

## 2015-02-10 ENCOUNTER — Encounter: Payer: Self-pay | Admitting: Cardiovascular Disease

## 2015-02-11 ENCOUNTER — Encounter: Payer: Self-pay | Admitting: Cardiovascular Disease

## 2015-02-11 ENCOUNTER — Ambulatory Visit (INDEPENDENT_AMBULATORY_CARE_PROVIDER_SITE_OTHER): Payer: BLUE CROSS/BLUE SHIELD | Admitting: Cardiovascular Disease

## 2015-02-11 VITALS — BP 164/70 | HR 73 | Ht 70.0 in | Wt 243.0 lb

## 2015-02-11 DIAGNOSIS — I10 Essential (primary) hypertension: Secondary | ICD-10-CM | POA: Diagnosis not present

## 2015-02-11 MED ORDER — LOSARTAN POTASSIUM 100 MG PO TABS: 100.0000 mg | ORAL_TABLET | Freq: Every day | ORAL | Status: DC

## 2015-02-11 NOTE — Patient Instructions (Addendum)

## 2015-03-11 ENCOUNTER — Encounter: Payer: Self-pay | Admitting: Family

## 2015-03-11 ENCOUNTER — Ambulatory Visit (INDEPENDENT_AMBULATORY_CARE_PROVIDER_SITE_OTHER): Payer: BLUE CROSS/BLUE SHIELD | Admitting: Family

## 2015-03-11 VITALS — BP 122/72 | HR 64 | Temp 97.1°F | Ht 70.0 in | Wt 246.2 lb

## 2015-03-11 DIAGNOSIS — M059 Rheumatoid arthritis with rheumatoid factor, unspecified: Secondary | ICD-10-CM

## 2015-03-11 DIAGNOSIS — L409 Psoriasis, unspecified: Secondary | ICD-10-CM | POA: Diagnosis not present

## 2015-03-11 DIAGNOSIS — E1165 Type 2 diabetes mellitus with hyperglycemia: Secondary | ICD-10-CM | POA: Diagnosis not present

## 2015-03-11 DIAGNOSIS — E785 Hyperlipidemia, unspecified: Secondary | ICD-10-CM | POA: Diagnosis not present

## 2015-03-11 DIAGNOSIS — E669 Obesity, unspecified: Secondary | ICD-10-CM | POA: Diagnosis not present

## 2015-03-11 DIAGNOSIS — I10 Essential (primary) hypertension: Secondary | ICD-10-CM

## 2015-03-11 LAB — POCT GLYCOSYLATED HEMOGLOBIN (HGB A1C): Hemoglobin A1C: 7.4

## 2015-03-11 MED ORDER — CALCIPOTRIENE-BETAMETH DIPROP 0.005-0.064 % EX OINT: TOPICAL_OINTMENT | Freq: Every day | CUTANEOUS | Status: DC

## 2015-03-11 NOTE — Addendum Note (Signed)
Addended by: Evelina Dun A on: 03/11/2015 09:28 AM   Modules accepted: Orders

## 2015-03-11 NOTE — Patient Instructions (Addendum)
Health Maintenance, Male A healthy lifestyle and preventative care can promote health and wellness.  Maintain regular health, dental, and eye exams.  Eat a healthy diet. Foods like vegetables, fruits, whole grains, low-fat dairy products, and lean protein foods contain the nutrients you need and are low in calories. Decrease your intake of foods high in solid fats, added sugars, and salt. Get information about a proper diet from your health care provider, if necessary.  Regular physical exercise is one of the most important things you can do for your health. Most adults should get at least 150 minutes of moderate-intensity exercise (any activity that increases your heart rate and causes you to sweat) each week. In addition, most adults need muscle-strengthening exercises on 2 or more days a week.   Maintain a healthy weight. The body mass index (BMI) is a screening tool to identify possible weight problems. It provides an estimate of body fat based on height and weight. Your health care provider can find your BMI and can help you achieve or maintain a healthy weight. For males 20 years and older:  A BMI below 18.5 is considered underweight.  A BMI of 18.5 to 24.9 is normal.  A BMI of 25 to 29.9 is considered overweight.  A BMI of 30 and above is considered obese.  Maintain normal blood lipids and cholesterol by exercising and minimizing your intake of saturated fat. Eat a balanced diet with plenty of fruits and vegetables. Blood tests for lipids and cholesterol should begin at age 20 and be repeated every 5 years. If your lipid or cholesterol levels are high, you are over age 50, or you are at high risk for heart disease, you may need your cholesterol levels checked more frequently.Ongoing high lipid and cholesterol levels should be treated with medicines if diet and exercise are not working.  If you smoke, find out from your health care provider how to quit. If you do not use tobacco, do not  start.  Lung cancer screening is recommended for adults aged 55-80 years who are at high risk for developing lung cancer because of a history of smoking. A yearly low-dose CT scan of the lungs is recommended for people who have at least a 30-pack-year history of smoking and are current smokers or have quit within the past 15 years. A pack year of smoking is smoking an average of 1 pack of cigarettes a day for 1 year (for example, a 30-pack-year history of smoking could mean smoking 1 pack a day for 30 years or 2 packs a day for 15 years). Yearly screening should continue until the smoker has stopped smoking for at least 15 years. Yearly screening should be stopped for people who develop a health problem that would prevent them from having lung cancer treatment.  If you choose to drink alcohol, do not have more than 2 drinks per day. One drink is considered to be 12 oz (360 mL) of beer, 5 oz (150 mL) of wine, or 1.5 oz (45 mL) of liquor.  Avoid the use of street drugs. Do not share needles with anyone. Ask for help if you need support or instructions about stopping the use of drugs.  High blood pressure causes heart disease and increases the risk of stroke. High blood pressure is more likely to develop in:  People who have blood pressure in the end of the normal range (100-139/85-89 mm Hg).  People who are overweight or obese.  People who are African American.    If you are 18-39 years of age, have your blood pressure checked every 3-5 years. If you are 40 years of age or older, have your blood pressure checked every year. You should have your blood pressure measured twice--once when you are at a hospital or clinic, and once when you are not at a hospital or clinic. Record the average of the two measurements. To check your blood pressure when you are not at a hospital or clinic, you can use:  An automated blood pressure machine at a pharmacy.  A home blood pressure monitor.  If you are 45-79 years  old, ask your health care provider if you should take aspirin to prevent heart disease.  Diabetes screening involves taking a blood sample to check your fasting blood sugar level. This should be done once every 3 years after age 45 if you are at a normal weight and without risk factors for diabetes. Testing should be considered at a younger age or be carried out more frequently if you are overweight and have at least 1 risk factor for diabetes.  Colorectal cancer can be detected and often prevented. Most routine colorectal cancer screening begins at the age of 50 and continues through age 75. However, your health care provider may recommend screening at an earlier age if you have risk factors for colon cancer. On a yearly basis, your health care provider may provide home test kits to check for hidden blood in the stool. A small camera at the end of a tube may be used to directly examine the colon (sigmoidoscopy or colonoscopy) to detect the earliest forms of colorectal cancer. Talk to your health care provider about this at age 50 when routine screening begins. A direct exam of the colon should be repeated every 5-10 years through age 75, unless early forms of precancerous polyps or small growths are found.  People who are at an increased risk for hepatitis B should be screened for this virus. You are considered at high risk for hepatitis B if:  You were born in a country where hepatitis B occurs often. Talk with your health care provider about which countries are considered high risk.  Your parents were born in a high-risk country and you have not received a shot to protect against hepatitis B (hepatitis B vaccine).  You have HIV or AIDS.  You use needles to inject street drugs.  You live with, or have sex with, someone who has hepatitis B.  You are a man who has sex with other men (MSM).  You get hemodialysis treatment.  You take certain medicines for conditions like cancer, organ  transplantation, and autoimmune conditions.  Hepatitis C blood testing is recommended for all people born from 1945 through 1965 and any individual with known risk factors for hepatitis C.  Healthy men should no longer receive prostate-specific antigen (PSA) blood tests as part of routine cancer screening. Talk to your health care provider about prostate cancer screening.  Testicular cancer screening is not recommended for adolescents or adult males who have no symptoms. Screening includes self-exam, a health care provider exam, and other screening tests. Consult with your health care provider about any symptoms you have or any concerns you have about testicular cancer.  Practice safe sex. Use condoms and avoid high-risk sexual practices to reduce the spread of sexually transmitted infections (STIs).  You should be screened for STIs, including gonorrhea and chlamydia if:  You are sexually active and are younger than 24 years.  You   are older than 24 years, and your health care provider tells you that you are at risk for this type of infection.  Your sexual activity has changed since you were last screened, and you are at an increased risk for chlamydia or gonorrhea. Ask your health care provider if you are at risk.  If you are at risk of being infected with HIV, it is recommended that you take a prescription medicine daily to prevent HIV infection. This is called pre-exposure prophylaxis (PrEP). You are considered at risk if:  You are a man who has sex with other men (MSM).  You are a heterosexual man who is sexually active with multiple partners.  You take drugs by injection.  You are sexually active with a partner who has HIV.  Talk with your health care provider about whether you are at high risk of being infected with HIV. If you choose to begin PrEP, you should first be tested for HIV. You should then be tested every 3 months for as long as you are taking PrEP.  Use sunscreen. Apply  sunscreen liberally and repeatedly throughout the day. You should seek shade when your shadow is shorter than you. Protect yourself by wearing long sleeves, pants, a wide-brimmed hat, and sunglasses year round whenever you are outdoors.  Tell your health care provider of new moles or changes in moles, especially if there is a change in shape or color. Also, tell your health care provider if a mole is larger than the size of a pencil eraser.  A one-time screening for abdominal aortic aneurysm (AAA) and surgical repair of large AAAs by ultrasound is recommended for men aged 62-75 years who are current or former smokers.  Stay current with your vaccines (immunizations).   This information is not intended to replace advice given to you by your health care provider. Make sure you discuss any questions you have with your health care provider.   Document Released: 09/22/2007 Document Revised: 04/16/2014 Document Reviewed: 08/21/2010 Elsevier Interactive Patient Education 2016 Elsevier Inc. Psoriasis Psoriasis is a long-term (chronic) condition of skin inflammation. It occurs because your immune system causes skin cells to form too quickly. As a result, too many skin cells grow and create raised, red patches (plaques) that look silvery on your skin. Plaques may appear anywhere on your body. They can be any size or shape. Psoriasis can come and go. The condition varies from mild to very severe. It cannot be passed from one person to another (not contagious).  CAUSES  The cause of psoriasis is not known, but certain factors can make the condition worse. These include:   Damage or trauma to the skin, such as cuts, scrapes, sunburn, and dryness.  Lack of sunlight.  Certain medicines.  Alcohol.  Tobacco use.  Stress.  Infections caused by bacteria or viruses. RISK FACTORS This condition is more likely to develop in:  People with a family history of psoriasis.  People who are  Caucasian.  People who are between the ages of 15-77 and 36-27 years old. SYMPTOMS  There are five different types of psoriasis. You can have more than one type of psoriasis during your life. Types are:   Plaque.  Guttate.  Inverse.  Pustular.  Erythrodermic. Each type of psoriasis has different symptoms.   Plaque psoriasis symptoms include red, raised plaques with a silvery white coating (scale). These plaques may be itchy. Your nails may be pitted and crumbly or fall off.  Guttate psoriasis symptoms include small  red spots that often show up on your trunk, arms, and legs. These spots may develop after you have been sick, especially with strep throat.  Inverse psoriasis symptoms include plaques in your underarm area, under your breasts, or on your genitals, groin, or buttocks.  Pustular psoriasis symptoms include pus-filled bumps that are painful, red, and swollen on the palms of your hands or the soles of your feet. You also may feel exhausted, feverish, weak, or have no appetite.  Erythrodermic psoriasis symptoms include bright red skin that may look burned. You may have a fast heartbeat and a body temperature that is too high or too low. You may be itchy or in pain. DIAGNOSIS  Your health care provider may suspect psoriasis based on your symptoms and family history. Your health care provider will also do a physical exam. This may include a procedure to remove a tissue sample (biopsy) for testing. You may also be referred to a health care provider who specializes in skin diseases (dermatologist).  TREATMENT There is no cure for this condition, but treatment can help manage it. Goals of treatment include:   Helping your skin heal.  Reducing itching and inflammation.  Slowing the growth of new skin cells.  Helping your immune system respond better to your skin. Treatment varies, depending on the severity of your condition. Treatment may include:   Creams or  ointments.  Ultraviolet ray exposure (light therapy). This may include natural sunlight or light therapy in a medical office.  Medicines (systemic therapy). These medicines can help your body better manage skin cell turnover and inflammation. They may be used along with light therapy or ointments. You may also get antibiotic medicines if you have an infection. HOME CARE INSTRUCTIONS Skin Care  Moisturize your skin as needed. Only use moisturizers that have been approved by your health care provider.   Apply cool compresses to the affected areas.   Do not scratch your skin.  Lifestyle  Do not use tobacco products. This includes cigarettes, chewing tobacco, and e-cigarettes. If you need help quitting, ask your health care provider.  Drink little or no alcohol.   Try techniques for stress reduction, such as meditation or yoga.  Get exposure to the sun as told by your health care provider. Do not get sunburned.   Consider joining a psoriasis support group.  Medicines  Take or use over-the-counter and prescription medicines only as told by your health care provider.  If you were prescribed an antibiotic, take or use it as told by your health care provider. Do not stop taking the antibiotic even if your condition starts to improve. General Instructions  Keep a journal to help track what triggers an outbreak. Try to avoid any triggers.   See a counselor or social worker if feelings of sadness, frustration, and hopelessness about your condition are interfering with your work and relationships.  Keep all follow-up visits as told by your health care provider. This is important. SEEK MEDICAL CARE IF:  Your pain gets worse.  You have increasing redness or warmth in the affected areas.   You have new or worsening pain or stiffness in your joints.  Your nails start to break easily or pull away from the nail bed.   You have a fever.   You feel depressed.   This  information is not intended to replace advice given to you by your health care provider. Make sure you discuss any questions you have with your health care provider.  Document Released: 03/23/2000 Document Revised: 12/15/2014 Document Reviewed: 08/11/2014 Elsevier Interactive Patient Education [employer].

## 2015-03-11 NOTE — Progress Notes (Signed)
Subjective:    Patient ID: [name], male    DOB: [birth year], 59 y.o.   MRN: [id number]  PT presents to the office today for chronic follow up.  Diabetes He presents for his follow-up diabetic visit. He has type 2 diabetes mellitus. His disease course has been improving. Pertinent negatives for hypoglycemia include no confusion, dizziness or headaches. Pertinent negatives for diabetes include no blurred vision, no foot paresthesias, no foot ulcerations and no visual change. Pertinent negatives for hypoglycemia complications include no blackouts. Symptoms are worsening. Pertinent negatives for diabetic complications include no CVA, heart disease, nephropathy or peripheral neuropathy. Risk factors for coronary artery disease include diabetes mellitus, dyslipidemia, hypertension, male sex and family history. Current diabetic treatment includes oral agent (monotherapy). He is compliant with treatment all of the time. He is following a generally healthy diet. His breakfast blood glucose range is generally 140-180 mg/dl. An ACE inhibitor/angiotensin II receptor blocker is being taken. Eye exam is not current.  Hypertension This is a chronic problem. The current episode started more than 1 year ago. The problem has been resolved since onset. The problem is controlled. Pertinent negatives include no blurred vision, headaches, palpitations or peripheral edema. Risk factors for coronary artery disease include diabetes mellitus, dyslipidemia, family history, male gender and obesity. Past treatments include angiotensin blockers. The current treatment provides mild improvement. There is no history of kidney disease, CAD/MI, CVA, heart failure or a thyroid problem. There is no history of sleep apnea.  Hyperlipidemia This is a chronic problem. The current episode started more than 1 year ago. The problem is controlled. Recent lipid tests were reviewed and are normal. Exacerbating diseases include diabetes and  obesity. He has no history of hypothyroidism. Factors aggravating his hyperlipidemia include fatty foods. Associated symptoms include leg pain. Pertinent negatives include no myalgias. Current antihyperlipidemic treatment includes statins. The current treatment provides moderate improvement of lipids. Risk factors for coronary artery disease include diabetes mellitus, dyslipidemia, family history, male sex and obesity.  Arthritis Presents for follow-up visit. The disease course has been stable. He complains of pain. Affected location: "Head to toe" His pain is at a severity of 8/10. Associated symptoms include pain at night and pain while resting. (Pt has rheumatoid and osteoarthritis and is followed by Ortho and rheumatologist every 3 months ) His past medical history is significant for osteoarthritis and rheumatoid arthritis. Past treatments include rest and an opioid. The treatment provided mild relief.    Pt has rheumatoid and osteoarthritis    Review of Systems  Constitutional: Negative.   HENT: Negative.   Eyes: Negative for blurred vision.  Respiratory: Negative.   Cardiovascular: Negative.  Negative for palpitations.  Gastrointestinal: Negative.   Endocrine: Negative.   Genitourinary: Negative.   Musculoskeletal: Positive for arthritis. Negative for myalgias.  Neurological: Negative.  Negative for dizziness and headaches.  Hematological: Negative.   Psychiatric/Behavioral: Negative.  Negative for confusion.  All other systems reviewed and are negative.      Objective:   Physical Exam  Constitutional: He is oriented to person, place, and time. He appears well-developed and well-nourished. No distress.  HENT:  Head: Normocephalic.  Right Ear: External ear normal.  Left Ear: External ear normal.  Nose: Nose normal.  Mouth/Throat: Oropharynx is clear and moist.  Eyes: Pupils are equal, round, and reactive to light. Right eye exhibits no discharge. Left eye exhibits no discharge.   Neck: Normal range of motion. Neck supple. No thyromegaly present.  Cardiovascular: Normal rate, regular  rhythm, normal heart sounds and intact distal pulses.   No murmur heard. Pulmonary/Chest: Effort normal and breath sounds normal. No respiratory distress. He has no wheezes.  Abdominal: Soft. Bowel sounds are normal. He exhibits no distension. There is no tenderness.  Musculoskeletal: Normal range of motion. He exhibits no edema or tenderness.  Neurological: He is alert and oriented to person, place, and time. He has normal reflexes. No cranial nerve deficit.  Skin: Skin is warm and dry. No rash noted. No erythema.  Psychiatric: He has a normal mood and affect. His behavior is normal. Judgment and thought content normal.  Vitals reviewed.     BP 122/72 mmHg  Pulse 64  Temp(Src) 97.1 F (36.2 C) (Oral)  Ht 5' 10"  (1.778 m)  Wt 246 lb 3.2 oz (111.676 kg)  BMI 35.33 kg/m2     Assessment & Plan:  1. Essential hypertension - CMP14+EGFR  2. Obesity - CMP14+EGFR  3. Hyperlipidemia - CMP14+EGFR - Lipid panel  4. Type 2 diabetes mellitus with hyperglycemia, without long-term current use of insulin ([place]) - Ambulatory referral to Ophthalmology - POCT glycosylated hemoglobin (Hb A1C) - CMP14+EGFR  5. Rheumatoid arthritis with positive rheumatoid factor, involving unspecified site ([place]) - CMP14+EGFR  6. Psoriasis -Do not scratch or pick at -Keep skin moisturized  - calcipotriene-betamethasone (TACLONEX) ointment; Apply topically daily.  Dispense: 60 g; Refill: 0    Continue all meds Labs pending Health Maintenance reviewed Diet and exercise encouraged RTO 3 months  Evelina Dun, FNP

## 2015-03-12 LAB — CMP14+EGFR
ALK PHOS: 80 IU/L (ref 39–117)
ALT: 26 IU/L (ref 0–44)
AST: 23 IU/L (ref 0–40)
Albumin/Globulin Ratio: 1.7 (ref 1.1–2.5)
Albumin: 4.3 g/dL (ref 3.6–4.8)
BILIRUBIN TOTAL: 0.4 mg/dL (ref 0.0–1.2)
BUN/Creatinine Ratio: 14 (ref 10–22)
BUN: 13 mg/dL (ref 8–27)
CHLORIDE: 98 mmol/L (ref 97–106)
CO2: 26 mmol/L (ref 18–29)
Calcium: 9.1 mg/dL (ref 8.6–10.2)
Creatinine, Ser: 0.96 mg/dL (ref 0.76–1.27)
GFR calc non Af Amer: 85 mL/min/{1.73_m2} (ref >59)
GFR, EST AFRICAN AMERICAN: 98 mL/min/{1.73_m2} (ref >59)
GLUCOSE: 143 mg/dL — AB (ref 65–99)
Globulin, Total: 2.6 g/dL (ref 1.5–4.5)
POTASSIUM: 4.6 mmol/L (ref 3.5–5.2)
Sodium: 139 mmol/L (ref 136–144)
TOTAL PROTEIN: 6.9 g/dL (ref 6.0–8.5)

## 2015-03-12 LAB — LIPID PANEL
CHOLESTEROL TOTAL: 114 mg/dL (ref 100–199)
Chol/HDL Ratio: 3.6 ratio units (ref 0.0–5.0)
HDL: 32 mg/dL — AB (ref >39)
LDL Calculated: 64 mg/dL (ref 0–99)
TRIGLYCERIDES: 89 mg/dL (ref 0–149)
VLDL CHOLESTEROL CAL: 18 mg/dL (ref 5–40)

## 2015-03-14 ENCOUNTER — Other Ambulatory Visit: Payer: Self-pay | Admitting: Family

## 2015-03-14 MED ORDER — SITAGLIPTIN PHOSPHATE 100 MG PO TABS: 100.0000 mg | ORAL_TABLET | Freq: Every day | ORAL | Status: DC

## 2015-03-16 ENCOUNTER — Telehealth: Payer: Self-pay | Admitting: Family

## 2015-03-16 NOTE — Telephone Encounter (Signed)
Reviewed test results

## 2015-03-30 LAB — HM DIABETES EYE EXAM

## 2015-04-19 ENCOUNTER — Other Ambulatory Visit: Payer: Self-pay | Admitting: Family

## 2015-04-19 ENCOUNTER — Other Ambulatory Visit: Payer: Self-pay | Admitting: Cardiovascular Disease

## 2015-04-19 MED ORDER — LOSARTAN POTASSIUM 100 MG PO TABS: 100.0000 mg | ORAL_TABLET | Freq: Every day | ORAL | Status: DC

## 2015-04-19 MED ORDER — SIMVASTATIN 10 MG PO TABS: 10.0000 mg | ORAL_TABLET | Freq: Every day | ORAL | Status: DC

## 2015-04-19 NOTE — Telephone Encounter (Signed)
°*  STAT* If patient is at the pharmacy, call can be transferred to refill team.   1. Which medications need to be refilled? (please list name of each medication and dose if known)  losartan (COZAAR) 100 MG tablet simvastatin (ZOCOR) 10 MG tablet   2. Which pharmacy/location (including street and city if local pharmacy) is medication to be sent to? Mail order [facility] [phone] RX BIN: [id number]  RX PCN:  [id number]  Rx group is [employer] [id number] ID [id number] Under [facility]    3. Do they need a 30 day or 90 day supply?90 day supply

## 2015-04-22 ENCOUNTER — Ambulatory Visit (INDEPENDENT_AMBULATORY_CARE_PROVIDER_SITE_OTHER): Payer: BLUE CROSS/BLUE SHIELD

## 2015-04-22 ENCOUNTER — Encounter: Payer: Self-pay | Admitting: Family

## 2015-04-22 ENCOUNTER — Ambulatory Visit (INDEPENDENT_AMBULATORY_CARE_PROVIDER_SITE_OTHER): Payer: BLUE CROSS/BLUE SHIELD | Admitting: Family

## 2015-04-22 VITALS — BP 136/83 | HR 75 | Temp 97.4°F | Ht 70.0 in | Wt 246.8 lb

## 2015-04-22 DIAGNOSIS — Z91013 Allergy to seafood: Secondary | ICD-10-CM

## 2015-04-22 DIAGNOSIS — Z01818 Encounter for other preprocedural examination: Secondary | ICD-10-CM | POA: Diagnosis not present

## 2015-04-22 MED ORDER — EPINEPHRINE 0.3 MG/0.3ML IJ SOAJ: INTRAMUSCULAR | Status: DC

## 2015-04-22 NOTE — Patient Instructions (Addendum)
Preparing for Knee Replacement Recovery from knee replacement surgery can be made easier and more comfortable by being prepared before surgery. This includes:   Arranging for others to help you.  Preparing your home.  Preparing your body by getting a preoperative exam and being as healthy as you can.  Doing exercises before your surgery as directed by your health care provider. You can ease any concerns about your financial responsibilities by calling your insurance company after you decide to have surgery. In addition to asking about your surgery and hospital stay, you will want to ask about coverage for medical equipment, rehabilitation facilities, and home care.  HOW SHOULD I ARRANGE FOR HELP?  You will be stronger and more mobile every day. However, in the first couple weeks after surgery, it is unlikely you will be able to do all your daily activities as easily as before your surgery. You may tire easily and will still have limited movement in your leg. Follow these guidelines to best arrange for the help you may need after your surgery:   Plan to have someone take you home after the procedure. Your health care provider will be able to tell you how many days you can expect to be in the hospital.  Cancel all work, caregiving, and volunteer responsibilities for at least 4-6 weeks after your surgery.  If you live alone, arrange for someone to care for your home and pets for the first 4-6 weeks after surgery.  Select someone with whom you feel comfortable to be with you day and night for the first week. This person will help you with your exercises and personal care, such as bathing and using the toilet.  Arrange for drivers to bring you to and from your follow-up appointments, the grocery store, and other places you may need to go for at least 4-6 weeks. [place]?   Pick a recovery spot, but do not plan on recovering in bed. Sitting upright is better for your health.  You may want to use a recliner with a small table nearby. Place the items you use most frequently on that table. These may include the TV remote, a cordless phone, a book or laptop computer, a water glass, and any other items of your choice.  Remove all clutter from your floors. Also remove any throw rugs.  To see if you will be able to move in your home with a wheeled walker, hold your hands out about 6 inches (15 cm) from your sides. Walk from your recovery spot to your kitchen and bathroom. Then walk from your bed to the bathroom. If you do not hit anything with your hands, you have enough room.  Move the items you use most often in your kitchen, bathroom, and bedroom to shelves and drawers that are at countertop height.  Prepare a few meals to freeze and reheat later.  Consider adding grab bars in the shower and near the toilet.  While you are in the hospital, you will learn about equipment that can be helpful for your recovery. Some of the equipment includes raised toilet seats, tub benches, and shower benches.  HOW SHOULD I PREPARE MY BODY?   Have a preoperative exam. This will ensure that your body is healthy enough to safely have this surgery. Bring a complete list of all your medicines and supplements, including herbs and vitamins. You may need to have additional tests to ensure your safety.  Have elective dental care and routine  cleanings completed before your surgery. Germs from anywhere in your body, including your mouth, can travel to your new joint and infect it. It is important not to have any dental work performed for at least 3 months after your surgery. After surgery, be sure to tell your dentist about your joint replacement.  Maintain a healthy diet. Do not change your diet before surgery unless advised to do so by your health care provider.  Do not use any tobacco products, including cigarettes, chewing tobacco, or electronic cigarettes. If you need help quitting, ask your  health care provider. Tobacco and nicotine products can delay healing after your surgery.  The day before your surgery, follow your health care provider's directions for showering, eating, drinking, and taking medicines. These directions are for your safety. WHAT KINDS OF EXERCISES SHOULD I DO? Your health care provider may have you do the following exercises before your surgery. Be sure to follow the exercise program only as directed by your health care provider. While completing these exercises, remember to stretch for as long as you can, up to 30 seconds. You should only feel a gentle lengthening or release in the stretched tissue. You should not feel pain.  Ankle Pumps  While sitting on a firm surface with your legs straight out in front of you, move your feet at the ankle joints so that your toes are pulling back toward your chest.  Reverse the motion, pointing your toes away from you.  Repeat 10-20 times. Complete this exercise 1-2 times per day. Heel Slides 1. Lie on your back with both knees straight. (If this causes back pain, bend one knee, placing your foot flat on the floor. Keep this leg in this position while doing heel slides with the opposite leg.) 2. Slowly slide one heel back toward your buttocks until you feel a gentle stretch in the front of your knee or thigh. 3. Slowly slide your heel back to the starting position. 4. Repeat 10-20 times, then switch heels and do it again. Complete this exercise 1-2 times per day. Quadriceps Sets 1. Lie on your back with one leg extended and your opposite knee bent.  2. Gradually tighten the muscles in the front of the thigh of your extended leg. This motion will push the back of the knee down toward the floor.  3. Hold the muscle as tightly as you can without causing pain for 10 seconds. 4. Relax the muscles slowly and completely in between each repetition. 5. Repeat 10-20 times, then switch legs and do it again. Complete this exercise  1-2 times per day. Short Arc Kicks 1. Lie on your back. Place a rolled towel (4-6 inches [10-15 cm] high) under one knee so that the knee slightly bends. 2. Raise only the lower leg of your slightly bent leg by tightening the muscles in the front of your thigh. Do not allow your thigh to rise. 3. Hold this position for 5 seconds. 4. Repeat 10-20 times, then switch legs and do it again. Complete this exercise 1-2 times per day. Straight Leg Raises 1. Lie on your back with one leg extended and your opposite knee bent. 2. Tighten the muscles in the front of the thigh of your extended leg. Your thigh may shake slightly. 3. Tighten these muscles even more and raise your leg 4-6 inches off the floor. Hold for 3-5 seconds. 4. Keeping these muscles tight, lower your leg. 5. Relax the muscles slowly and completely in between each repetition.  6.  Repeat 10-20 times, then switch legs and do it again. Complete this exercise 1-2 times per day. Arm Chair Push-ups 1. Find a firm, non-wheeled chair with solid armrests. 2. Sitting in the chair, extend one leg straight out in front of you. 3. Lift up your body weight, using your arms and opposite leg. 4. Slowly lower your body weight. 5. Repeat 10-20 times, then switch legs and do it again. Complete this exercise 1-2 times per day.   This information is not intended to replace advice given to you by your health care provider. Make sure you discuss any questions you have with your health care provider.   Document Released: 06/30/2010 Document Revised: 04/16/2014 Document Reviewed: 06/18/2013 Elsevier Interactive Patient Education 2016 [employer] Total Knee Replacement Total knee replacement is a procedure to replace your knee joint with an artificial knee joint (prosthetic knee joint). The purpose of this surgery is to reduce pain and improve your knee function. LET [facility] CARE PROVIDER KNOW ABOUT:   Any allergies you have.  All medicines you  are taking, including vitamins, herbs, eye drops, creams, and over-the-counter medicines.  Previous problems you or members of your family have had with the use of anesthetics.  Any blood disorders you have.  Previous surgeries you have had.  Medical conditions you have. RISKS AND COMPLICATIONS  Generally, total knee replacement is a safe procedure. However, problems can occur, including:  Loss of range of motion of the knee or instability of the knee.  Loosening of the prosthesis.  Infection.  Persistent pain. BEFORE THE PROCEDURE   Plan to have someone take you home after the procedure.  Do not eat or drink anything after midnight on the night before the procedure or as directed by your health care provider.  Ask your health care provider about:  Changing or stopping your regular medicines. This is especially important if you are taking diabetes medicines or blood thinners.  Taking medicines such as aspirin and ibuprofen. These medicines can thin your blood. Do not take these medicines before your procedure if your health care provider asks you not to.  Ask your health care provider about how your surgical site will be marked or identified.  You may be given antibiotic medicines to help prevent infection. PROCEDURE   To reduce your risk of infection:  Your health care team will wash or sanitize their hands.  Your skin will be washed with soap.  An IV tube will be inserted into one of your veins. You will be given one or more of the following:  A medicine that makes you drowsy (sedative).  A medicine that makes you fall asleep (general anesthetic).  A medicine injected into your spine that numbs your body below the waist (spinal anesthetic).  A medicine to block feeling in your leg (nerve block) to help ease pain after surgery.  An incision will be made in your knee. Your surgeon will take out any damaged cartilage and bone by sawing off the damaged  surfaces.  The surgeon will then put a new metal liner over the sawed-off portion of your thigh bone (femur) and a plastic liner over the sawed-off portion of one of the bones of your lower leg (tibia). This is to restore alignment and function to your knee. A plastic piece is often used to restore the surface of your knee cap. AFTER THE PROCEDURE   You will stay in a recovery area until your medicines wear off.  You may have drainage  tubes to drain excess fluid from your knee. These tubes attach to a device that removes these fluids.  Once you are awake, stable, and taking fluids well, you will be taken to your hospital room.  You may be directed to take actions to help prevent blood clots. These may include:  Walking shortly after surgery, with someone assisting you. Moving around after surgery helps to improve blood flow.  Wearing compression stockings or using different types of devices.  You will receive physical therapy as prescribed by your health care provider.   This information is not intended to replace advice given to you by your health care provider. Make sure you discuss any questions you have with your health care provider.   Document Released: 07/02/2000 Document Revised: 12/15/2014 Document Reviewed: 05/06/2011 Elsevier Interactive Patient Education [employer].

## 2015-04-22 NOTE — Progress Notes (Signed)
   Subjective:    Patient ID: [name], male    DOB: 15-Jul-1955, 60 y.o.   MRN: [id number]   HPI PT presents to the office today for surgical clearance for a right partial knee replacement. Pt has well controlled DM 2 and HTN. Pt denies any headache, palpitations, SOB, or edema at this time.     Review of Systems  Constitutional: Negative.   HENT: Negative.   Respiratory: Negative.   Cardiovascular: Negative.   Gastrointestinal: Negative.   Endocrine: Negative.   Genitourinary: Negative.   Musculoskeletal: Negative.   Neurological: Negative.   Hematological: Negative.   Psychiatric/Behavioral: Negative.   All other systems reviewed and are negative.      Objective:   Physical Exam  Constitutional: He is oriented to person, place, and time. He appears well-developed and well-nourished. No distress.  HENT:  Head: Normocephalic.  Right Ear: External ear normal.  Left Ear: External ear normal.  Nose: Nose normal.  Mouth/Throat: Oropharynx is clear and moist.  Eyes: Pupils are equal, round, and reactive to light. Right eye exhibits no discharge. Left eye exhibits no discharge.  Neck: Normal range of motion. Neck supple. No thyromegaly present.  Cardiovascular: Normal rate, regular rhythm, normal heart sounds and intact distal pulses.   No murmur heard. Pulmonary/Chest: Effort normal and breath sounds normal. No respiratory distress. He has no wheezes.  Abdominal: Soft. Bowel sounds are normal. He exhibits no distension. There is no tenderness.  Musculoskeletal: Normal range of motion. He exhibits no edema or tenderness.  Neurological: He is alert and oriented to person, place, and time. He has normal reflexes. No cranial nerve deficit.  Skin: Skin is warm and dry. No rash noted. No erythema.  Psychiatric: He has a normal mood and affect. His behavior is normal. Judgment and thought content normal.  Vitals reviewed.  EKG- Sinus Rhythm   Chest X-ray- [facility] Preliminary  reading by Evelina Dun, FNP [facility]    BP 136/83 mmHg  Pulse 75  Temp(Src) 97.4 F (36.3 C) (Oral)  Ht '5\' 10"'$  (1.778 m)  Wt 246 lb 12.8 oz (111.948 kg)  BMI 35.41 kg/m2     Assessment & Plan:  1. Pre-op evaluation -Labs pending- Once back will fax pre op clearance form -Keep appts with Ortho -RTO prn - DG Chest 2 View; Future - EKG 12-Lead - CMP14+EGFR - CBC with Differential/Platelet  Evelina Dun, FNP

## 2015-04-23 LAB — CMP14+EGFR
ALK PHOS: 86 IU/L (ref 39–117)
ALT: 23 IU/L (ref 0–44)
AST: 24 IU/L (ref 0–40)
Albumin/Globulin Ratio: 1.7 (ref 1.1–2.5)
Albumin: 4.4 g/dL (ref 3.6–4.8)
BUN/Creatinine Ratio: 17 (ref 10–22)
BUN: 18 mg/dL (ref 8–27)
Bilirubin Total: 0.4 mg/dL (ref 0.0–1.2)
CHLORIDE: 98 mmol/L (ref 96–106)
CO2: 20 mmol/L (ref 18–29)
CREATININE: 1.09 mg/dL (ref 0.76–1.27)
Calcium: 9.3 mg/dL (ref 8.6–10.2)
GFR calc Af Amer: 84 mL/min/{1.73_m2} (ref >59)
GFR calc non Af Amer: 73 mL/min/{1.73_m2} (ref >59)
GLUCOSE: 221 mg/dL — AB (ref 65–99)
Globulin, Total: 2.6 g/dL (ref 1.5–4.5)
Potassium: 4.3 mmol/L (ref 3.5–5.2)
Sodium: 140 mmol/L (ref 134–144)
Total Protein: 7 g/dL (ref 6.0–8.5)

## 2015-04-23 LAB — CBC WITH DIFFERENTIAL/PLATELET
BASOS: 1 %
Basophils Absolute: 0.1 10*3/uL (ref 0.0–0.2)
EOS (ABSOLUTE): 0.5 10*3/uL — AB (ref 0.0–0.4)
Eos: 6 %
HEMOGLOBIN: 14 g/dL (ref 12.6–17.7)
Hematocrit: 40.9 % (ref 37.5–51.0)
IMMATURE GRANS (ABS): 0 10*3/uL (ref 0.0–0.1)
Immature Granulocytes: 0 %
LYMPHS ABS: 2.4 10*3/uL (ref 0.7–3.1)
LYMPHS: 29 %
MCH: 29.7 pg (ref 26.6–33.0)
MCHC: 34.2 g/dL (ref 31.5–35.7)
MCV: 87 fL (ref 79–97)
Monocytes Absolute: 0.4 10*3/uL (ref 0.1–0.9)
Monocytes: 5 %
NEUTROS ABS: 4.7 10*3/uL (ref 1.4–7.0)
Neutrophils: 59 %
PLATELETS: 233 10*3/uL (ref 150–379)
RBC: 4.71 x10E6/uL (ref 4.14–5.80)
RDW: 12.6 % (ref 12.3–15.4)
WBC: 8 10*3/uL (ref 3.4–10.8)

## 2015-05-05 ENCOUNTER — Telehealth: Payer: Self-pay | Admitting: Cardiovascular Disease

## 2015-05-05 NOTE — Telephone Encounter (Signed)
Making sure this appt is routed

## 2015-05-05 NOTE — Telephone Encounter (Signed)
Surgical clearance was faxed to Derek Jack with [facility] at [phone]. Called and left message for Derek Jack to return call.

## 2015-05-05 NOTE — Telephone Encounter (Signed)
New Message  Request for surgical clearance:  What type of surgery is being performed? Knee Surgery   When is this surgery scheduled? 05/13/2015  Are there any medications that need to be held prior to surgery and how long? Asprin and meloxicam (MOBIC) 15 MG tablet  Name of physician performing surgery? Dr. Filbert Schilder  Comments: Pt called states that [facility] has faxed over the clearance twice with no response to clearance. Pt is simply calling to follow up.

## 2015-05-05 NOTE — Telephone Encounter (Signed)
Surgical Clearance can be found in media part of patient's chart. Clearance was signed and faxed on 04/19/2015

## 2015-05-09 ENCOUNTER — Encounter (HOSPITAL_BASED_OUTPATIENT_CLINIC_OR_DEPARTMENT_OTHER): Payer: Self-pay | Admitting: *Deleted

## 2015-05-09 ENCOUNTER — Encounter (HOSPITAL_BASED_OUTPATIENT_CLINIC_OR_DEPARTMENT_OTHER)
Admission: RE | Admit: 2015-05-09 | Discharge: 2015-05-09 | Disposition: A | Discharge status: Home / Self Care | Payer: BLUE CROSS/BLUE SHIELD | Source: Ambulatory Visit | Attending: Orthopedic Surgery | Admitting: Orthopedic Surgery

## 2015-05-09 DIAGNOSIS — E119 Type 2 diabetes mellitus without complications: Secondary | ICD-10-CM | POA: Insufficient documentation

## 2015-05-09 DIAGNOSIS — I10 Essential (primary) hypertension: Secondary | ICD-10-CM | POA: Insufficient documentation

## 2015-05-09 DIAGNOSIS — Z01812 Encounter for preprocedural laboratory examination: Secondary | ICD-10-CM | POA: Diagnosis present

## 2015-05-09 LAB — SURGICAL PCR SCREEN
MRSA, PCR: NEGATIVE
Staphylococcus aureus: POSITIVE — AB

## 2015-05-10 ENCOUNTER — Other Ambulatory Visit: Payer: Self-pay | Admitting: Orthopedic Surgery

## 2015-05-13 ENCOUNTER — Ambulatory Visit (HOSPITAL_COMMUNITY): Payer: BLUE CROSS/BLUE SHIELD

## 2015-05-13 ENCOUNTER — Encounter (HOSPITAL_BASED_OUTPATIENT_CLINIC_OR_DEPARTMENT_OTHER)
Admission: RE | Disposition: A | Discharge status: Home / Self Care | Payer: Self-pay | Source: Ambulatory Visit | Attending: Orthopedic Surgery

## 2015-05-13 ENCOUNTER — Ambulatory Visit (HOSPITAL_BASED_OUTPATIENT_CLINIC_OR_DEPARTMENT_OTHER): Payer: BLUE CROSS/BLUE SHIELD | Admitting: Certified Registered"

## 2015-05-13 ENCOUNTER — Encounter (HOSPITAL_BASED_OUTPATIENT_CLINIC_OR_DEPARTMENT_OTHER): Payer: Self-pay | Admitting: Certified Registered"

## 2015-05-13 ENCOUNTER — Ambulatory Visit (HOSPITAL_BASED_OUTPATIENT_CLINIC_OR_DEPARTMENT_OTHER)
Admission: RE | Admit: 2015-05-13 | Discharge: 2015-05-14 | Disposition: A | Discharge status: Home / Self Care | Payer: BLUE CROSS/BLUE SHIELD | Source: Ambulatory Visit | Attending: Orthopedic Surgery | Admitting: Orthopedic Surgery

## 2015-05-13 DIAGNOSIS — I10 Essential (primary) hypertension: Secondary | ICD-10-CM | POA: Insufficient documentation

## 2015-05-13 DIAGNOSIS — E119 Type 2 diabetes mellitus without complications: Secondary | ICD-10-CM | POA: Insufficient documentation

## 2015-05-13 DIAGNOSIS — M1711 Unilateral primary osteoarthritis, right knee: Secondary | ICD-10-CM | POA: Diagnosis present

## 2015-05-13 DIAGNOSIS — E785 Hyperlipidemia, unspecified: Secondary | ICD-10-CM | POA: Diagnosis not present

## 2015-05-13 DIAGNOSIS — Z87891 Personal history of nicotine dependence: Secondary | ICD-10-CM | POA: Diagnosis not present

## 2015-05-13 DIAGNOSIS — E559 Vitamin D deficiency, unspecified: Secondary | ICD-10-CM | POA: Insufficient documentation

## 2015-05-13 DIAGNOSIS — Z7984 Long term (current) use of oral hypoglycemic drugs: Secondary | ICD-10-CM | POA: Insufficient documentation

## 2015-05-13 DIAGNOSIS — Z7982 Long term (current) use of aspirin: Secondary | ICD-10-CM | POA: Diagnosis not present

## 2015-05-13 DIAGNOSIS — M069 Rheumatoid arthritis, unspecified: Secondary | ICD-10-CM | POA: Diagnosis not present

## 2015-05-13 DIAGNOSIS — Z96651 Presence of right artificial knee joint: Secondary | ICD-10-CM

## 2015-05-13 HISTORY — DX: Unilateral primary osteoarthritis, right knee: M17.11

## 2015-05-13 HISTORY — PX: PARTIAL KNEE ARTHROPLASTY: SHX2174

## 2015-05-13 LAB — GLUCOSE, CAPILLARY
Glucose-Capillary: 169 mg/dL — ABNORMAL HIGH (ref 65–99)
Glucose-Capillary: 225 mg/dL — ABNORMAL HIGH (ref 65–99)
Glucose-Capillary: 229 mg/dL — ABNORMAL HIGH (ref 65–99)

## 2015-05-13 SURGERY — ARTHROPLASTY, KNEE, UNICOMPARTMENTAL
Anesthesia: Regional | Site: Knee | Laterality: Right

## 2015-05-13 MED ORDER — PHENYLEPHRINE 40 MCG/ML (10ML) SYRINGE FOR IV PUSH (FOR BLOOD PRESSURE SUPPORT)
PREFILLED_SYRINGE | INTRAVENOUS | Status: AC
  Filled 2015-05-13: qty 10

## 2015-05-13 MED ORDER — FENTANYL CITRATE (PF) 100 MCG/2ML IJ SOLN
INTRAMUSCULAR | Status: AC
  Filled 2015-05-13: qty 2

## 2015-05-13 MED ORDER — BUPIVACAINE-EPINEPHRINE (PF) 0.5% -1:200000 IJ SOLN
INTRAMUSCULAR | Status: DC | PRN
  Administered 2015-05-13: 30 mL via PERINEURAL

## 2015-05-13 MED ORDER — METOCLOPRAMIDE HCL 5 MG/ML IJ SOLN: 5.0000 mg | Freq: Three times a day (TID) | INTRAMUSCULAR | Status: DC | PRN

## 2015-05-13 MED ORDER — ONDANSETRON HCL 4 MG PO TABS: 4.0000 mg | ORAL_TABLET | Freq: Three times a day (TID) | ORAL | Status: DC | PRN

## 2015-05-13 MED ORDER — ONDANSETRON HCL 4 MG/2ML IJ SOLN
INTRAMUSCULAR | Status: AC
  Filled 2015-05-13: qty 2

## 2015-05-13 MED ORDER — DEXAMETHASONE SODIUM PHOSPHATE 10 MG/ML IJ SOLN
INTRAMUSCULAR | Status: AC
  Filled 2015-05-13: qty 1

## 2015-05-13 MED ORDER — METHOCARBAMOL 500 MG PO TABS
500.0000 mg | ORAL_TABLET | Freq: Four times a day (QID) | ORAL | Status: DC | PRN
  Administered 2015-05-13 – 2015-05-14 (×3): 500 mg via ORAL
  Filled 2015-05-13 (×3): qty 1

## 2015-05-13 MED ORDER — ZOLPIDEM TARTRATE 5 MG PO TABS: 5.0000 mg | ORAL_TABLET | Freq: Every evening | ORAL | Status: DC | PRN

## 2015-05-13 MED ORDER — SENNA 8.6 MG PO TABS
1.0000 | ORAL_TABLET | Freq: Two times a day (BID) | ORAL | Status: DC
  Administered 2015-05-13: 8.6 mg via ORAL
  Filled 2015-05-13: qty 1

## 2015-05-13 MED ORDER — OXYCODONE HCL 5 MG PO TABS: 5.0000 mg | ORAL_TABLET | Freq: Once | ORAL | Status: DC | PRN

## 2015-05-13 MED ORDER — CEFAZOLIN SODIUM 1-5 GM-% IV SOLN
1.0000 g | Freq: Four times a day (QID) | INTRAVENOUS | Status: AC
  Administered 2015-05-13 – 2015-05-14 (×3): 1 g via INTRAVENOUS
  Filled 2015-05-13 (×3): qty 50

## 2015-05-13 MED ORDER — BISACODYL 10 MG RE SUPP: 10.0000 mg | Freq: Every day | RECTAL | Status: DC | PRN

## 2015-05-13 MED ORDER — METFORMIN HCL 500 MG PO TABS
1000.0000 mg | ORAL_TABLET | Freq: Two times a day (BID) | ORAL | Status: DC
  Administered 2015-05-13: 1000 mg via ORAL

## 2015-05-13 MED ORDER — GLYCOPYRROLATE 0.2 MG/ML IJ SOLN: 0.2000 mg | Freq: Once | INTRAMUSCULAR | Status: DC | PRN

## 2015-05-13 MED ORDER — LINAGLIPTIN 5 MG PO TABS: 5.0000 mg | ORAL_TABLET | Freq: Every day | ORAL | Status: DC

## 2015-05-13 MED ORDER — RIVAROXABAN 10 MG PO TABS: 10.0000 mg | ORAL_TABLET | Freq: Every day | ORAL | Status: DC

## 2015-05-13 MED ORDER — ONDANSETRON HCL 4 MG/2ML IJ SOLN
INTRAMUSCULAR | Status: DC | PRN
  Administered 2015-05-13: 4 mg via INTRAVENOUS

## 2015-05-13 MED ORDER — MIDAZOLAM HCL 2 MG/2ML IJ SOLN
INTRAMUSCULAR | Status: AC
  Filled 2015-05-13: qty 2

## 2015-05-13 MED ORDER — PROPOFOL 500 MG/50ML IV EMUL
INTRAVENOUS | Status: AC
  Filled 2015-05-13: qty 50

## 2015-05-13 MED ORDER — METHOCARBAMOL 1000 MG/10ML IJ SOLN: 500.0000 mg | Freq: Four times a day (QID) | INTRAVENOUS | Status: DC | PRN

## 2015-05-13 MED ORDER — HYDROMORPHONE HCL 1 MG/ML IJ SOLN
INTRAMUSCULAR | Status: AC
  Filled 2015-05-13: qty 1

## 2015-05-13 MED ORDER — ONDANSETRON HCL 4 MG/2ML IJ SOLN
4.0000 mg | Freq: Four times a day (QID) | INTRAMUSCULAR | Status: DC | PRN
Start: 2015-05-13 — End: 2015-05-14

## 2015-05-13 MED ORDER — HYDROMORPHONE HCL 1 MG/ML IJ SOLN: 0.5000 mg | INTRAMUSCULAR | Status: DC | PRN

## 2015-05-13 MED ORDER — EPHEDRINE SULFATE 50 MG/ML IJ SOLN
INTRAMUSCULAR | Status: AC
  Filled 2015-05-13: qty 1

## 2015-05-13 MED ORDER — LIDOCAINE HCL (CARDIAC) 20 MG/ML IV SOLN
INTRAVENOUS | Status: DC | PRN
  Administered 2015-05-13: 30 mg via INTRAVENOUS

## 2015-05-13 MED ORDER — OXYCODONE-ACETAMINOPHEN 10-325 MG PO TABS: 1.0000 | ORAL_TABLET | Freq: Four times a day (QID) | ORAL | Status: AC | PRN

## 2015-05-13 MED ORDER — BACLOFEN 10 MG PO TABS: 10.0000 mg | ORAL_TABLET | Freq: Three times a day (TID) | ORAL | Status: DC

## 2015-05-13 MED ORDER — OXYCODONE-ACETAMINOPHEN 5-325 MG PO TABS
1.0000 | ORAL_TABLET | ORAL | Status: DC | PRN
  Administered 2015-05-13 – 2015-05-14 (×4): 2 via ORAL
  Filled 2015-05-13 (×4): qty 2

## 2015-05-13 MED ORDER — HYDROMORPHONE HCL 1 MG/ML IJ SOLN
0.2500 mg | INTRAMUSCULAR | Status: DC | PRN
  Administered 2015-05-13 (×4): 0.5 mg via INTRAVENOUS

## 2015-05-13 MED ORDER — LOSARTAN POTASSIUM 50 MG PO TABS: 100.0000 mg | ORAL_TABLET | Freq: Every day | ORAL | Status: DC

## 2015-05-13 MED ORDER — SCOPOLAMINE 1 MG/3DAYS TD PT72: 1.0000 | MEDICATED_PATCH | Freq: Once | TRANSDERMAL | Status: DC | PRN

## 2015-05-13 MED ORDER — DOCUSATE SODIUM 100 MG PO CAPS
100.0000 mg | ORAL_CAPSULE | Freq: Two times a day (BID) | ORAL | Status: DC
  Administered 2015-05-13: 100 mg via ORAL
  Filled 2015-05-13: qty 1

## 2015-05-13 MED ORDER — CEFAZOLIN SODIUM-DEXTROSE 2-3 GM-% IV SOLR
INTRAVENOUS | Status: AC
  Filled 2015-05-13: qty 50

## 2015-05-13 MED ORDER — SODIUM CHLORIDE 0.9 % IV SOLN
INTRAVENOUS | Status: DC | PRN
  Administered 2015-05-13: 1000 mL

## 2015-05-13 MED ORDER — POLYETHYLENE GLYCOL 3350 17 G PO PACK: 17.0000 g | PACK | Freq: Every day | ORAL | Status: DC | PRN

## 2015-05-13 MED ORDER — SODIUM CHLORIDE 0.9 % IV SOLN
INTRAVENOUS | Status: DC
  Administered 2015-05-13 (×2): via INTRAVENOUS

## 2015-05-13 MED ORDER — SENNA-DOCUSATE SODIUM 8.6-50 MG PO TABS: 2.0000 | ORAL_TABLET | Freq: Every day | ORAL | Status: DC

## 2015-05-13 MED ORDER — MAGNESIUM CITRATE PO SOLN: 1.0000 | Freq: Once | ORAL | Status: DC | PRN

## 2015-05-13 MED ORDER — MIDAZOLAM HCL 2 MG/2ML IJ SOLN
1.0000 mg | INTRAMUSCULAR | Status: DC | PRN
  Administered 2015-05-13: 2 mg via INTRAVENOUS

## 2015-05-13 MED ORDER — SIMVASTATIN 10 MG PO TABS: 10.0000 mg | ORAL_TABLET | Freq: Every day | ORAL | Status: DC

## 2015-05-13 MED ORDER — HYDROMORPHONE HCL 2 MG PO TABS: 2.0000 mg | ORAL_TABLET | ORAL | Status: DC | PRN

## 2015-05-13 MED ORDER — LACTATED RINGERS IV SOLN
INTRAVENOUS | Status: DC
  Administered 2015-05-13 (×2): via INTRAVENOUS
  Administered 2015-05-13: 10 mL/h via INTRAVENOUS

## 2015-05-13 MED ORDER — ONDANSETRON HCL 4 MG PO TABS: 4.0000 mg | ORAL_TABLET | Freq: Four times a day (QID) | ORAL | Status: DC | PRN

## 2015-05-13 MED ORDER — DEXAMETHASONE SODIUM PHOSPHATE 10 MG/ML IJ SOLN
INTRAMUSCULAR | Status: DC | PRN
  Administered 2015-05-13: 6 mg via INTRAVENOUS

## 2015-05-13 MED ORDER — LIDOCAINE HCL (CARDIAC) 20 MG/ML IV SOLN
INTRAVENOUS | Status: AC
  Filled 2015-05-13: qty 5

## 2015-05-13 MED ORDER — PROPOFOL 10 MG/ML IV BOLUS
INTRAVENOUS | Status: DC | PRN
  Administered 2015-05-13: 200 mg via INTRAVENOUS

## 2015-05-13 MED ORDER — METOCLOPRAMIDE HCL 5 MG PO TABS: 5.0000 mg | ORAL_TABLET | Freq: Three times a day (TID) | ORAL | Status: DC | PRN

## 2015-05-13 MED ORDER — OXYCODONE HCL 5 MG PO TABS: 5.0000 mg | ORAL_TABLET | ORAL | Status: DC | PRN

## 2015-05-13 MED ORDER — CEFAZOLIN SODIUM-DEXTROSE 2-3 GM-% IV SOLR
2.0000 g | INTRAVENOUS | Status: AC
  Administered 2015-05-13: 2 g via INTRAVENOUS

## 2015-05-13 MED ORDER — EPHEDRINE SULFATE 50 MG/ML IJ SOLN
INTRAMUSCULAR | Status: DC | PRN
  Administered 2015-05-13 (×2): 10 mg via INTRAVENOUS

## 2015-05-13 MED ORDER — OXYCODONE HCL 5 MG/5ML PO SOLN: 5.0000 mg | Freq: Once | ORAL | Status: DC | PRN

## 2015-05-13 MED ORDER — ONDANSETRON HCL 4 MG/2ML IJ SOLN: 4.0000 mg | Freq: Four times a day (QID) | INTRAMUSCULAR | Status: DC | PRN

## 2015-05-13 MED ORDER — FENTANYL CITRATE (PF) 100 MCG/2ML IJ SOLN
50.0000 ug | INTRAMUSCULAR | Status: AC | PRN
  Administered 2015-05-13: 25 ug via INTRAVENOUS
  Administered 2015-05-13: 100 ug via INTRAVENOUS
  Administered 2015-05-13: 25 ug via INTRAVENOUS
  Administered 2015-05-13: 50 ug via INTRAVENOUS

## 2015-05-13 SURGICAL SUPPLY — 69 items
BANDAGE ACE 6X5 VEL STRL LF (GAUZE/BANDAGES/DRESSINGS) ×2 IMPLANT
BANDAGE ESMARK 6X9 LF (GAUZE/BANDAGES/DRESSINGS) ×1 IMPLANT
BLADE SURG 10 STRL SS (BLADE) ×2 IMPLANT
BLADE SURG 15 STRL LF DISP TIS (BLADE) ×2 IMPLANT
BLADE SURG 15 STRL SS (BLADE) ×4
BNDG CMPR 9X6 STRL LF SNTH (GAUZE/BANDAGES/DRESSINGS) ×1
BNDG ESMARK 6X9 LF (GAUZE/BANDAGES/DRESSINGS) ×2
BOWL SMART MIX CTS (DISPOSABLE) ×2 IMPLANT
CANISTER SUCT 1200ML W/VALVE (MISCELLANEOUS) IMPLANT
CANISTER SUCTION 2500CC (MISCELLANEOUS) ×2 IMPLANT
CAPT KNEE PARTIAL 2 ×2 IMPLANT
CEMENT HV SMART SET (Cement) ×2 IMPLANT
CLSR STERI-STRIP ANTIMIC 1/2X4 (GAUZE/BANDAGES/DRESSINGS) ×2 IMPLANT
COVER BACK TABLE 60X90IN (DRAPES) ×2 IMPLANT
CUFF TOURNIQUET SINGLE 34IN LL (TOURNIQUET CUFF) ×2 IMPLANT
DECANTER SPIKE VIAL GLASS SM (MISCELLANEOUS) IMPLANT
DRAPE EXTREMITY T 121X128X90 (DRAPE) ×2 IMPLANT
DRAPE U 20/CS (DRAPES) ×2 IMPLANT
DRAPE U-SHAPE 47X51 STRL (DRAPES) ×2 IMPLANT
DRSG PAD ABDOMINAL 8X10 ST (GAUZE/BANDAGES/DRESSINGS) ×2 IMPLANT
DURAPREP 26ML APPLICATOR (WOUND CARE) ×2 IMPLANT
ELECT REM PT RETURN 9FT ADLT (ELECTROSURGICAL) ×2
ELECTRODE REM PT RTRN 9FT ADLT (ELECTROSURGICAL) ×1 IMPLANT
FACESHIELD WRAPAROUND (MASK) ×4 IMPLANT
GAUZE SPONGE 4X4 12PLY STRL (GAUZE/BANDAGES/DRESSINGS) ×2 IMPLANT
GLOVE BIO SURGEON STRL SZ7 (GLOVE) ×2 IMPLANT
GLOVE BIO SURGEON STRL SZ8 (GLOVE) ×2 IMPLANT
GLOVE BIOGEL PI IND STRL 7.0 (GLOVE) ×2 IMPLANT
GLOVE BIOGEL PI IND STRL 7.5 (GLOVE) ×1 IMPLANT
GLOVE BIOGEL PI IND STRL 8 (GLOVE) ×2 IMPLANT
GLOVE BIOGEL PI INDICATOR 7.0 (GLOVE) ×2
GLOVE BIOGEL PI INDICATOR 7.5 (GLOVE) ×1
GLOVE BIOGEL PI INDICATOR 8 (GLOVE) ×2
GLOVE ECLIPSE 6.5 STRL STRAW (GLOVE) ×4 IMPLANT
GLOVE ORTHO TXT STRL SZ7.5 (GLOVE) ×2 IMPLANT
GOWN STRL REUS W/ TWL LRG LVL3 (GOWN DISPOSABLE) ×2 IMPLANT
GOWN STRL REUS W/ TWL XL LVL3 (GOWN DISPOSABLE) ×2 IMPLANT
GOWN STRL REUS W/TWL LRG LVL3 (GOWN DISPOSABLE) ×4
GOWN STRL REUS W/TWL XL LVL3 (GOWN DISPOSABLE) ×4
HANDPIECE INTERPULSE COAX TIP (DISPOSABLE) ×2
IMMOBILIZER KNEE 22 UNIV (SOFTGOODS) ×2 IMPLANT
IMMOBILIZER KNEE 24 THIGH 36 (MISCELLANEOUS) IMPLANT
IMMOBILIZER KNEE 24 UNIV (MISCELLANEOUS)
KIT SAW BLADE (KITS) ×2 IMPLANT
MANIFOLD NEPTUNE II (INSTRUMENTS) ×2 IMPLANT
NS IRRIG 1000ML POUR BTL (IV SOLUTION) ×2 IMPLANT
PACK ARTHROSCOPY DSU (CUSTOM PROCEDURE TRAY) ×2 IMPLANT
PACK BASIN DAY SURGERY FS (CUSTOM PROCEDURE TRAY) ×2 IMPLANT
PAD CAST 4YDX4 CTTN HI CHSV (CAST SUPPLIES) ×1 IMPLANT
PADDING CAST COTTON 4X4 STRL (CAST SUPPLIES) ×2
PENCIL BUTTON HOLSTER BLD 10FT (ELECTRODE) ×2 IMPLANT
SET HNDPC FAN SPRY TIP SCT (DISPOSABLE) ×1 IMPLANT
SHEET MEDIUM DRAPE 40X70 STRL (DRAPES) ×2 IMPLANT
SLEEVE SCD COMPRESS KNEE MED (MISCELLANEOUS) ×2 IMPLANT
SPONGE LAP 18X18 X RAY DECT (DISPOSABLE) ×2 IMPLANT
SUCTION FRAZIER HANDLE 10FR (MISCELLANEOUS) ×1
SUCTION TUBE FRAZIER 10FR DISP (MISCELLANEOUS) ×1 IMPLANT
SUT MNCRL AB 4-0 PS2 18 (SUTURE) IMPLANT
SUT VIC AB 0 CT1 27 (SUTURE) ×1
SUT VIC AB 0 CT1 27XBRD ANBCTR (SUTURE) ×1 IMPLANT
SUT VIC AB 2-0 SH 27 (SUTURE)
SUT VIC AB 2-0 SH 27XBRD (SUTURE) IMPLANT
SUT VICRYL 3-0 CR8 SH (SUTURE) ×2 IMPLANT
SUT VICRYL 4-0 PS2 18IN ABS (SUTURE) IMPLANT
SYR BULB IRRIGATION 50ML (SYRINGE) ×2 IMPLANT
TOWEL OR 17X24 6PK STRL BLUE (TOWEL DISPOSABLE) ×2 IMPLANT
TOWEL OR NON WOVEN STRL DISP B (DISPOSABLE) ×4 IMPLANT
TUBE CONNECTING 20X1/4 (TUBING) IMPLANT
YANKAUER SUCT BULB TIP NO VENT (SUCTIONS) ×2 IMPLANT

## 2015-05-13 NOTE — Transfer of Care (Signed)
Immediate Anesthesia Transfer of Care Note  Patient: [name]  Procedure(s) Performed: Procedure(s): RIGHT UNI KNEE ARTHROPLASTY (Right)  Patient Location: PACU  Anesthesia Type:GA combined with regional for post-op pain  Level of Consciousness: awake and patient cooperative  Airway & Oxygen Therapy: Patient Spontanous Breathing and Patient connected to face mask oxygen  Post-op Assessment: Report given to RN and Post -op Vital signs reviewed and stable  Post vital signs: Reviewed and stable  Last Vitals:  Filed Vitals:   05/13/15 0840 05/13/15 0845  BP: 130/76   Pulse: 63 61  Temp:    Resp: 12 14    Complications: No apparent anesthesia complications

## 2015-05-13 NOTE — Anesthesia Procedure Notes (Addendum)
Anesthesia Regional Block:  Femoral nerve block  Pre-Anesthetic Checklist: ,, timeout performed, Correct Patient, Correct Site, Correct Laterality, Correct Procedure,, site marked, risks and benefits discussed, Surgical consent,  Pre-op evaluation,  At surgeon's request and post-op pain management  Laterality: Right  Prep: chloraprep       Needles:  Injection technique: Single-shot  Needle Type: Echogenic Stimulator Needle     Needle Length: 9cm 9 cm Needle Gauge: 21 and 21 G    Additional Needles:  Procedures: nerve stimulator Femoral nerve block  Nerve Stimulator or Paresthesia:  Response: Quadriceps muscle contraction, 0.45 mA,   Additional Responses:   Narrative:  Start time: 05/13/2015 8:25 AM End time: 05/13/2015 8:36 AM Injection made incrementally with aspirations every 5 mL.  Performed by: Personally  Anesthesiologist: HODIERNE, ADAM  Additional Notes: Functioning IV was confirmed and monitors were applied.  A 11mm 21ga Arrow echogenic stimulator needle was used. Sterile prep and drape,hand hygiene and sterile gloves were used.  Negative aspiration and negative test dose prior to incremental administration of local anesthetic. The patient tolerated the procedure well.     Procedure Name: LMA Insertion Date/Time: 05/13/2015 8:52 AM Performed by: Kaj Vasil D Pre-anesthesia Checklist: Patient identified, Emergency Drugs available, Suction available and Patient being monitored Patient Re-evaluated:Patient Re-evaluated prior to inductionOxygen Delivery Method: Circle System Utilized Preoxygenation: Pre-oxygenation with 100% oxygen Intubation Type: IV induction Ventilation: Mask ventilation without difficulty LMA: LMA inserted LMA Size: 4.0 Number of attempts: 1 Airway Equipment and Method: Bite block Placement Confirmation: positive ETCO2 Tube secured with: Tape Dental Injury: Teeth and Oropharynx as per pre-operative assessment

## 2015-05-13 NOTE — Discharge Instructions (Signed)
INSTRUCTIONS AFTER JOINT REPLACEMENT  ° °o Remove items at home which could result in a fall. This includes throw rugs or furniture in walking pathways °o ICE to the affected joint every three hours while awake for 30 minutes at a time, for at least the first 3-5 days, and then as needed for pain and swelling.  Continue to use ice for pain and swelling. You may notice swelling that will progress down to the foot and ankle.  This is normal after surgery.  Elevate your leg when you are not up walking on it.   °o Continue to use the breathing machine you got in the hospital (incentive spirometer) which will help keep your temperature down.  It is common for your temperature to cycle up and down following surgery, especially at night when you are not up moving around and exerting yourself.  The breathing machine keeps your lungs expanded and your temperature down. ° ° °DIET:  As you were doing prior to hospitalization, we recommend a well-balanced diet. ° °DRESSING / WOUND CARE / SHOWERING ° °You may change your dressing 3-5 days after surgery.  Then change the dressing every day with sterile gauze.  Please use good hand washing techniques before changing the dressing.  Do not use any lotions or creams on the incision until instructed by your surgeon. ° °ACTIVITY ° °o Increase activity slowly as tolerated, but follow the weight bearing instructions below.   °o No driving for 6 weeks or until further direction given by your physician.  You cannot drive while taking narcotics.  °o No lifting or carrying greater than 10 lbs. until further directed by your surgeon. °o Avoid periods of inactivity such as sitting longer than an hour when not asleep. This helps prevent blood clots.  °o You may return to work once you are authorized by your doctor.  ° ° ° °WEIGHT BEARING  ° °Weight bearing as tolerated with assist device (walker, cane, etc) as directed, use it as long as suggested by your surgeon or therapist, typically at  least 4-6 weeks. ° ° °EXERCISES ° °Results after joint replacement surgery are often greatly improved when you follow the exercise, range of motion and muscle strengthening exercises prescribed by your doctor. Safety measures are also important to protect the joint from further injury. Any time any of these exercises cause you to have increased pain or swelling, decrease what you are doing until you are comfortable again and then slowly increase them. If you have problems or questions, call your caregiver or physical therapist for advice.  ° °Rehabilitation is important following a joint replacement. After just a few days of immobilization, the muscles of the leg can become weakened and shrink (atrophy).  These exercises are designed to build up the tone and strength of the thigh and leg muscles and to improve motion. Often times heat used for twenty to thirty minutes before working out will loosen up your tissues and help with improving the range of motion but do not use heat for the first two weeks following surgery (sometimes heat can increase post-operative swelling).  ° °These exercises can be done on a training (exercise) mat, on the floor, on a table or on a bed. Use whatever works the best and is most comfortable for you.    Use music or television while you are exercising so that the exercises are a pleasant break in your day. This will make your life better with the exercises acting as a break   in your routine that you can look forward to.   Perform all exercises about fifteen times, three times per day or as directed.  You should exercise both the operative leg and the other leg as well. ° °Exercises include: °  °• Quad Sets - Tighten up the muscle on the front of the thigh (Quad) and hold for 5-10 seconds.   °• Straight Leg Raises - With your knee straight (if you were given a brace, keep it on), lift the leg to 60 degrees, hold for 3 seconds, and slowly lower the leg.  Perform this exercise against  resistance later as your leg gets stronger.  °• Leg Slides: Lying on your back, slowly slide your foot toward your buttocks, bending your knee up off the floor (only go as far as is comfortable). Then slowly slide your foot back down until your leg is flat on the floor again.  °• Angel Wings: Lying on your back spread your legs to the side as far apart as you can without causing discomfort.  °• Hamstring Strength:  Lying on your back, push your heel against the floor with your leg straight by tightening up the muscles of your buttocks.  Repeat, but this time bend your knee to a comfortable angle, and push your heel against the floor.  You may put a pillow under the heel to make it more comfortable if necessary.  ° °A rehabilitation program following joint replacement surgery can speed recovery and prevent re-injury in the future due to weakened muscles. Contact your doctor or a physical therapist for more information on knee rehabilitation.  ° ° °CONSTIPATION ° °Constipation is defined medically as fewer than three stools per week and severe constipation as less than one stool per week.  Even if you have a regular bowel pattern at home, your normal regimen is likely to be disrupted due to multiple reasons following surgery.  Combination of anesthesia, postoperative narcotics, change in appetite and fluid intake all can affect your bowels.  ° °YOU MUST use at least one of the following options; they are listed in order of increasing strength to get the job done.  They are all available over the counter, and you may need to use some, POSSIBLY even all of these options:   ° °Drink plenty of fluids (prune juice may be helpful) and high fiber foods °Colace 100 mg by mouth twice a day  °Senokot for constipation as directed and as needed Dulcolax (bisacodyl), take with full glass of water  °Miralax (polyethylene glycol) once or twice a day as needed. ° °If you have tried all these things and are unable to have a bowel  movement in the first 3-4 days after surgery call either your surgeon or your primary doctor.   ° °If you experience loose stools or diarrhea, hold the medications until you stool forms back up.  If your symptoms do not get better within 1 week or if they get worse, check with your doctor.  If you experience "the worst abdominal pain ever" or develop nausea or vomiting, please contact the office immediately for further recommendations for treatment. ° ° °ITCHING:  If you experience itching with your medications, try taking only a single pain pill, or even half a pain pill at a time.  You can also use Benadryl over the counter for itching or also to help with sleep.  ° °TED HOSE STOCKINGS:  Use stockings on both legs until for at least 2 weeks or as   directed by physician office. They may be removed at night for sleeping.  MEDICATIONS:  See your medication summary on the After Visit Summary that nursing will review with you.  You may have some home medications which will be placed on hold until you complete the course of blood thinner medication.  It is important for you to complete the blood thinner medication as prescribed.  PRECAUTIONS:  If you experience chest pain or shortness of breath - call 911 immediately for transfer to the hospital emergency department.   If you develop a fever greater that 101 F, purulent drainage from wound, increased redness or drainage from wound, foul odor from the wound/dressing, or calf pain - CONTACT YOUR SURGEON.                                                   FOLLOW-UP APPOINTMENTS:  If you do not already have a post-op appointment, please call the office for an appointment to be seen by your surgeon.  Guidelines for how soon to be seen are listed in your After Visit Summary, but are typically between 1-4 weeks after surgery.  MAKE SURE YOU:   Understand these instructions.   Get help right away if you are not doing well or get worse.    Thank you for  letting us be a part of your medical care team.  It is a privilege we respect greatly.  We hope these instructions will help you stay on track for a fast and full recovery!    Post Anesthesia Home Care Instructions  Activity: Get plenty of rest for the remainder of the day. A responsible adult should stay with you for 24 hours following the procedure.  For the next 24 hours, DO NOT: -Drive a car -Paediatric nurse -Drink alcoholic beverages -Take any medication unless instructed by your physician -Make any legal decisions or sign important papers.  Meals: Start with liquid foods such as gelatin or soup. Progress to regular foods as tolerated. Avoid greasy, spicy, heavy foods. If nausea and/or vomiting occur, drink only clear liquids until the nausea and/or vomiting subsides. Call your physician if vomiting continues.  Special Instructions/Symptoms: Your throat may feel dry or sore from the anesthesia or the breathing tube placed in your throat during surgery. If this causes discomfort, gargle with warm salt water. The discomfort should disappear within 24 hours.  If you had a scopolamine patch placed behind your ear for the management of post- operative nausea and/or vomiting:  1. The medication in the patch is effective for 72 hours, after which it should be removed.  Wrap patch in a tissue and discard in the trash. Wash hands thoroughly with soap and water. 2. You may remove the patch earlier than 72 hours if you experience unpleasant side effects which may include dry mouth, dizziness or visual disturbances. 3. Avoid touching the patch. Wash your hands with soap and water after contact with the patch.

## 2015-05-13 NOTE — H&P (Signed)
PREOPERATIVE H&P  Chief Complaint: UNILATERAL PRIMARY OSTEOARTHRITIS,RIGHT KNEE  HPI: [name] is a 60 y.o. male who presents for preoperative history and physical with a diagnosis of UNILATERAL PRIMARY OSTEOARTHRITIS,RIGHT KNEE. Symptoms are rated as moderate to severe, and have been worsening.  This is significantly impairing activities of daily living.  He has elected for surgical management. Pain is rated 7/10, difficulty with walking and limitations of activities of daily living. He has failed injections, activity modification, anti-inflammatories, and assistive devices.  He also has rheumatoid arthritis and has undergone infusion treatment  Preoperative X-rays demonstrate end stage degenerative changes with osteophyte formation, loss of joint space, subchondral sclerosis.   Past Medical History  Diagnosis Date  . Hypertension   . Diabetes mellitus   . Hyperlipidemia   . Arthritis     rheumatoid arthritis  . Vitamin D deficiency    Past Surgical History  Procedure Laterality Date  . Carpal tunnel release  2009    both hands  . Cardiac catheterization  2005    normal  . Tonsillectomy    . Knee arthroscopy      rt  . Scaphoid excision Left 2012    Dr Daylene Katayama   Social History   Social History  . Marital Status: Married    Spouse Name: N/A  . Number of Children: N/A  . Years of Education: N/A   Social History Main Topics  . Smoking status: Former Smoker    Quit date: 03/05/1991  . Smokeless tobacco: None  . Alcohol Use: No  . Drug Use: No  . Sexual Activity: Not Asked   Other Topics Concern  . None   Social History Narrative   Family History  Problem Relation Age of Onset  . Diabetes Father   . Hypertension Father   . Heart Problems Father   . Stroke Father    Allergies  Allergen Reactions  . Shellfish Allergy Shortness Of Breath and Swelling  . Invokana [Canagliflozin] Other (See Comments)    Causes bladder infections  . Keflex [Cephalexin]    Pt does not tol-nervous   Prior to Admission medications   Medication Sig Start Date End Date Taking? Authorizing Provider  abatacept in sodium chloride 0.9 % Inject into the vein every 30 (thirty) days.   Yes Historical Provider, MD  aspirin 81 MG chewable tablet Chew 81 mg by mouth daily.     Yes Historical Provider, MD  losartan (COZAAR) 100 MG tablet Take 1 tablet (100 mg total) by mouth daily. 04/19/15  Yes Josue Hector, MD  meloxicam (MOBIC) 15 MG tablet Take 15 mg by mouth daily.   Yes Historical Provider, MD  metFORMIN (GLUCOPHAGE) 1000 MG tablet Take 1 tablet (1,000 mg total) by mouth 2 (two) times daily with a meal. 06/01/14  Yes Sharion Balloon, FNP  oxyCODONE-acetaminophen (PERCOCET) 10-325 MG tablet Take 2 tablets by mouth daily as needed. For pain 01/06/15  Yes Historical Provider, MD  simvastatin (ZOCOR) 10 MG tablet Take 1 tablet (10 mg total) by mouth daily. 04/19/15  Yes Josue Hector, MD  EPINEPHrine (EPIPEN 2-PAK) 0.3 mg/0.3 mL IJ SOAJ injection USE AS DIRECTED AS NEEDED 04/22/15   Sharion Balloon, FNP  sitaGLIPtin (JANUVIA) 100 MG tablet Take 1 tablet (100 mg total) by mouth daily. Patient not taking: Reported on 04/22/2015 03/14/15   Sharion Balloon, FNP     Positive ROS: All other systems have been reviewed and were otherwise negative with the exception of those  mentioned in the HPI and as above.  Physical Exam: General: Alert, no acute distress Cardiovascular: No pedal edema Respiratory: No cyanosis, no use of accessory musculature GI: No organomegaly, abdomen is soft and non-tender Skin: No lesions in the area of chief complaint Neurologic: Sensation intact distally Psychiatric: Patient is competent for consent with normal mood and affect Lymphatic: No axillary or cervical lymphadenopathy  MUSCULOSKELETAL: Right knee has varus alignment with medial crepitance and pseudo-valgus with range of motion of 0-125 of intact [place].    Assessment: UNILATERAL PRIMARY  OSTEOARTHRITIS,RIGHT KNEE   Plan: Plan for Procedure(s): RIGHT UNI KNEE ARTHROPLASTY  The risks benefits and alternatives were discussed with the patient including but not limited to the risks of nonoperative treatment, versus surgical intervention including infection, bleeding, nerve injury,  blood clots, cardiopulmonary complications, morbidity, mortality, among others, and they were willing to proceed.   Johnny Bridge, MD Cell [phone]   05/13/2015 8:31 AM

## 2015-05-13 NOTE — Anesthesia Preprocedure Evaluation (Signed)
Anesthesia Evaluation  Patient identified by MRN, date of birth, ID band Patient awake    Reviewed: Allergy & Precautions, NPO status , Patient's Chart, lab work & pertinent test results  Airway Mallampati: II   Neck ROM: full    Dental   Pulmonary former smoker,    breath sounds clear to auscultation       Cardiovascular hypertension,  Rhythm:regular Rate:Normal     Neuro/Psych    GI/Hepatic   Endo/Other  diabetes, Type 2obese  Renal/GU      Musculoskeletal  (+) Arthritis ,   Abdominal   Peds  Hematology   Anesthesia Other Findings   Reproductive/Obstetrics                             Anesthesia Physical Anesthesia Plan  ASA: II  Anesthesia Plan: General and Regional   Post-op Pain Management: MAC Combined w/ Regional for Post-op pain   Induction: Intravenous  Airway Management Planned: LMA  Additional Equipment:   Intra-op Plan:   Post-operative Plan:   Informed Consent: I have reviewed the patients History and Physical, chart, labs and discussed the procedure including the risks, benefits and alternatives for the proposed anesthesia with the patient or authorized representative who has indicated his/her understanding and acceptance.     Plan Discussed with: CRNA, Anesthesiologist and Surgeon  Anesthesia Plan Comments:         Anesthesia Quick Evaluation

## 2015-05-13 NOTE — Anesthesia Postprocedure Evaluation (Signed)
Anesthesia Post Note  Patient: [name]  Procedure(s) Performed: Procedure(s) (LRB): RIGHT UNI KNEE ARTHROPLASTY (Right)  Patient location during evaluation: PACU Anesthesia Type: General Level of consciousness: awake and alert and patient cooperative Pain management: pain level controlled Vital Signs Assessment: post-procedure vital signs reviewed and stable Respiratory status: spontaneous breathing and respiratory function stable Cardiovascular status: stable Anesthetic complications: no    Last Vitals:  Filed Vitals:   05/13/15 1215 05/13/15 1230  BP: 107/56 177/77  Pulse: 89 109  Temp:  36.4 C  Resp: 10 16    Last Pain:  Filed Vitals:   05/13/15 1256  PainSc: [place]

## 2015-05-13 NOTE — Progress Notes (Signed)
Assisted Dr. Hodierne with right, ultrasound guided, femoral nerve block. Side rails up, monitors on throughout procedure. See vital signs in flow sheet. Tolerated Procedure well. 

## 2015-05-13 NOTE — Op Note (Signed)
05/13/2015  10:52 AM  PATIENT:  [name]    PRE-OPERATIVE DIAGNOSIS:  UNILATERAL PRIMARY OSTEOARTHRITIS,RIGHT KNEE  POST-OPERATIVE DIAGNOSIS:  Same  PROCEDURE:  RIGHT UNI KNEE ARTHROPLASTY  SURGEON:  Johnny Bridge, MD  PHYSICIAN ASSISTANT: Joya Gaskins, OPA-C, present and scrubbed throughout the case, critical for completion in a timely fashion, and for retraction, instrumentation, and closure.  ANESTHESIA:   General  PREOPERATIVE INDICATIONS:  [name] is a  60 y.o. male with a diagnosis of UNILATERAL PRIMARY OSTEOARTHRITIS,RIGHT KNEE who failed conservative measures and elected for surgical management.    The risks benefits and alternatives were discussed with the patient preoperatively including but not limited to the risks of infection, bleeding, nerve injury, cardiopulmonary complications, blood clots, the need for revision surgery, among others, and the patient was willing to proceed.  OPERATIVE IMPLANTS: Biomet [place] mobile bearing medial compartment arthroplasty femur size medium, tibia size E, bearing size 4.  OPERATIVE FINDINGS: Endstage grade 4 medial compartment osteoarthritis. The undersurface of the patella had some grade 2 changes at worst, and there was some grade 2 changes on the femoral trochlea, and a very small area of grade 2 changes on the lateral femoral condyle as well, these were far less significant than the severe advanced chondral loss on the medial side with even some degree of bone loss. The medial femoral condyle was extremely wide, but sized to a size medium from front to back. The ACL was intact.  OPERATIVE PROCEDURE: The patient was brought to the operating room placed in supine position. General anesthesia was administered. IV antibiotics were given. The lower extremity was placed in the legholder and prepped and draped in usual sterile fashion. We initially had trouble with a clamp on the leg holder, which was adjusted and then was stable  throughout the case.  Time out was performed.  The leg was elevated and exsanguinated and the tourniquet was inflated. Anteromedial incision was performed, and I took care to preserve the MCL. Parapatellar incision was carried out, and the osteophytes were excised, along with the medial meniscus and a small portion of the fat pad.  The extra medullary tibial cutting jig was applied, using the spoon and the 29mm G-Clamp and the 2 mm shim, and I took care to protect the anterior cruciate ligament insertion and the tibial spine. The medial collateral ligament was also protected, and I resected my proximal tibia, matching the anatomic slope. Initially, this did not provide adequate tibial resection, and I recut the tibia with the neutral shim.  The proximal tibial bony cut was removed, and I turned my attention to the femur.  The intramedullary femoral rod was placed using the drill, and then using the appropriate reference, I assembled the femoral jig, setting my posterior cutting block. I resected my posterior femur, used the 0 spigot for the anterior femur, and then measured my gap.   I then used the appropriate mill to match the extension gap to the flexion gap. The second milling was at a 4.  The gaps were then measured again with the appropriate feeler gauges. Once I had balanced flexion and extension gaps, I then completed the preparation of the femur. Interestingly, with the reaming of the distal femur, it appeared like a was taking a substantial amount of bone, although it all progressed normally.  I milled off the anterior aspect of the distal femur to prevent impingement. I also exposed the tibia, and selected the above-named component, and then used the cutting  jig to prepare the keel slot on the tibia. I also used the awl to curette out the bone to complete the preparation of the keel. The back wall was intact.  I then placed trial components, and it was found to have excellent motion, and  appropriate balance.  I then cemented the components into place, cementing the tibia first, removing all excess cement, and then cementing the femur.  All loose cement was removed.  The real polyethylene insert was applied manually, and the knee was taken through functional range of motion, and found to have excellent stability and restoration of joint motion, with excellent balance.  The wounds were irrigated copiously, and the parapatellar tissue closed with Vicryl, followed by Vicryl for the subcutaneous tissue, with routine closure with Steri-Strips and sterile gauze.  The tourniquet was released, and the patient was awakened and extubated and returned to PACU in stable and satisfactory condition. There were no complications.

## 2015-05-14 DIAGNOSIS — M1711 Unilateral primary osteoarthritis, right knee: Secondary | ICD-10-CM | POA: Diagnosis not present

## 2015-05-16 ENCOUNTER — Encounter (HOSPITAL_BASED_OUTPATIENT_CLINIC_OR_DEPARTMENT_OTHER): Payer: Self-pay | Admitting: Orthopedic Surgery

## 2015-05-31 ENCOUNTER — Ambulatory Visit (INDEPENDENT_AMBULATORY_CARE_PROVIDER_SITE_OTHER): Payer: BLUE CROSS/BLUE SHIELD | Admitting: Family Medicine

## 2015-05-31 ENCOUNTER — Encounter: Payer: Self-pay | Admitting: Family Medicine

## 2015-05-31 VITALS — BP 156/94 | HR 114 | Temp 99.4°F | Ht 70.0 in | Wt 247.0 lb

## 2015-05-31 DIAGNOSIS — R059 Cough, unspecified: Secondary | ICD-10-CM

## 2015-05-31 DIAGNOSIS — R05 Cough: Secondary | ICD-10-CM

## 2015-05-31 LAB — POCT INFLUENZA A/B
Influenza A, POC: NEGATIVE
Influenza B, POC: NEGATIVE

## 2015-05-31 MED ORDER — HYDROCOD POLST-CPM POLST ER 10-8 MG/5ML PO SUER: 5.0000 mL | Freq: Two times a day (BID) | ORAL | Status: DC | PRN

## 2015-05-31 NOTE — Progress Notes (Signed)
Subjective:  Patient ID: [name], male    DOB: May 16, 1955  Age: 60 y.o. MRN: [id number]  CC: URI   HPI [name] presents for  Patient presents with dry cough runny stuffy nose. Diffuse headache of moderate intensity. Patient also has chills and subjective fever. Body aches worst in the back but present in the torso as well. Has sapped the energy to the point that of being unable to perform usual activities other than ADLs. Onset last evening with chills and sweats and cough overnight..    History [name] has a past medical history of Hypertension; Diabetes mellitus; Hyperlipidemia; Arthritis; Vitamin D deficiency; and Primary localized osteoarthritis of right knee (05/13/2015).   He has past surgical history that includes Carpal tunnel release (2009); Cardiac catheterization (2005); Tonsillectomy; Knee arthroscopy; scaphoid excision (Left, 2012); and Partial knee arthroplasty (Right, 05/13/2015).   His family history includes Diabetes in his father; Heart Problems in his father; Hypertension in his father; Stroke in his father.He reports that he quit smoking about 24 years ago. He does not have any smokeless tobacco history on file. He reports that he does not drink alcohol or use illicit drugs.    ROS Review of Systems  Constitutional: Positive for appetite change (decreased) and fatigue. Negative for fever, chills and activity change.  HENT: Positive for congestion and postnasal drip. Negative for ear discharge, ear pain, hearing loss, nosebleeds, rhinorrhea, sinus pressure, sneezing, sore throat and trouble swallowing.   Respiratory: Positive for cough (dry). Negative for chest tightness and shortness of breath.   Cardiovascular: Negative for chest pain and palpitations.  Gastrointestinal: Negative for nausea and abdominal pain.  Musculoskeletal: Positive for myalgias (mild so far).  Skin: Negative for rash.    Objective:  BP 156/94 mmHg  Pulse 114  Temp(Src) 99.4 F  (37.4 C) (Oral)  Ht 5\' 10"  (1.778 m)  Wt 247 lb (112.038 kg)  BMI 35.44 kg/m2  SpO2 97%  BP Readings from Last 3 Encounters:  05/31/15 156/94  05/14/15 112/64  04/22/15 136/83    Wt Readings from Last 3 Encounters:  05/31/15 247 lb (112.038 kg)  05/13/15 240 lb 6.4 oz (109.045 kg)  04/22/15 246 lb 12.8 oz (111.948 kg)     Physical Exam  Constitutional: He appears well-developed and well-nourished.  HENT:  Head: Normocephalic and atraumatic.  Right Ear: Tympanic membrane and external ear normal. No decreased hearing is noted.  Left Ear: Tympanic membrane and external ear normal. No decreased hearing is noted.  Mouth/Throat: No oropharyngeal exudate or posterior oropharyngeal erythema.  Eyes: Pupils are equal, round, and reactive to light.  Neck: Normal range of motion. Neck supple.  Cardiovascular: Normal rate and regular rhythm.   No murmur heard. Pulmonary/Chest: Breath sounds normal. No respiratory distress.  Abdominal: Soft. Bowel sounds are normal. He exhibits no mass. There is no tenderness.  Vitals reviewed.    Lab Results  Component Value Date   WBC 8.0 04/22/2015   HGB 14.1 01/29/2014   HCT 40.9 04/22/2015   PLT 233 04/22/2015   GLUCOSE 221* 04/22/2015   CHOL 114 03/11/2015   TRIG 89 03/11/2015   HDL 32* 03/11/2015   LDLCALC 64 03/11/2015   ALT 23 04/22/2015   AST 24 04/22/2015   NA 140 04/22/2015   K 4.3 04/22/2015   CL 98 04/22/2015   CREATININE 1.09 04/22/2015   BUN 18 04/22/2015   CO2 20 04/22/2015   TSH 2.400 06/01/2014   PSA 0.4 10/28/2013  HGBA1C 7.4 03/11/2015    No results found.  Assessment & Plan:   [name] was seen today for uri.  Diagnoses and all orders for this visit:  Cough -     POCT Influenza A/B  Other orders -     chlorpheniramine-HYDROcodone (TUSSIONEX PENNKINETIC ER) 10-8 MG/5ML SUER; Take 5 mLs by mouth every 12 (twelve) hours as needed for cough.    Results for orders placed or performed in visit on 05/31/15    POCT Influenza A/B  Result Value Ref Range   Influenza A, POC Negative Negative   Influenza B, POC Negative Negative     I have discontinued [name]'s ondansetron, baclofen, sennosides-docusate sodium, rivaroxaban, and HYDROmorphone. I am also having him start on chlorpheniramine-HYDROcodone. Additionally, I am having him maintain his metFORMIN, abatacept in sodium chloride 0.9 %, sitaGLIPtin, simvastatin, losartan, EPINEPHrine, and oxyCODONE-acetaminophen.  Meds ordered this encounter  Medications  . chlorpheniramine-HYDROcodone (TUSSIONEX PENNKINETIC ER) 10-8 MG/5ML SUER    Sig: Take 5 mLs by mouth every 12 (twelve) hours as needed for cough.    Dispense:  140 mL    Refill:  0     Follow-up: Return if symptoms worsen or fail to improve.  Claretta Fraise, M.D.

## 2015-06-13 ENCOUNTER — Encounter: Payer: Self-pay | Admitting: Family

## 2015-06-13 ENCOUNTER — Ambulatory Visit (INDEPENDENT_AMBULATORY_CARE_PROVIDER_SITE_OTHER): Payer: BLUE CROSS/BLUE SHIELD | Admitting: Family

## 2015-06-13 VITALS — BP 169/94 | HR 72 | Temp 97.5°F | Ht 70.0 in | Wt 241.4 lb

## 2015-06-13 DIAGNOSIS — M059 Rheumatoid arthritis with rheumatoid factor, unspecified: Secondary | ICD-10-CM

## 2015-06-13 DIAGNOSIS — L409 Psoriasis, unspecified: Secondary | ICD-10-CM

## 2015-06-13 DIAGNOSIS — E669 Obesity, unspecified: Secondary | ICD-10-CM

## 2015-06-13 DIAGNOSIS — I10 Essential (primary) hypertension: Secondary | ICD-10-CM

## 2015-06-13 DIAGNOSIS — E785 Hyperlipidemia, unspecified: Secondary | ICD-10-CM | POA: Diagnosis not present

## 2015-06-13 DIAGNOSIS — E1165 Type 2 diabetes mellitus with hyperglycemia: Secondary | ICD-10-CM

## 2015-06-13 DIAGNOSIS — M1711 Unilateral primary osteoarthritis, right knee: Secondary | ICD-10-CM

## 2015-06-13 MED ORDER — HYDROCHLOROTHIAZIDE 12.5 MG PO TABS: 12.5000 mg | ORAL_TABLET | Freq: Every day | ORAL | Status: DC

## 2015-06-13 NOTE — Progress Notes (Signed)
Subjective:    Patient ID: ABE SCHOOLS, male    DOB: 1955/08/04, 60 y.o.   MRN: [id number]  PT presents to the office today for chronic follow up.  Diabetes He presents for his follow-up diabetic visit. He has type 2 diabetes mellitus. His disease course has been improving. Pertinent negatives for hypoglycemia include no confusion, dizziness or headaches. Pertinent negatives for diabetes include no blurred vision, no foot paresthesias, no foot ulcerations and no visual change. Pertinent negatives for hypoglycemia complications include no blackouts. Symptoms are worsening. Pertinent negatives for diabetic complications include no CVA, heart disease, nephropathy or peripheral neuropathy. Risk factors for coronary artery disease include diabetes mellitus, dyslipidemia, hypertension, male sex and family history. Current diabetic treatment includes oral agent (monotherapy). He is compliant with treatment all of the time. He is following a generally healthy diet. His breakfast blood glucose range is generally 110-130 mg/dl. An ACE inhibitor/angiotensin II receptor blocker is being taken. Eye exam is current.  Hypertension This is a chronic problem. The current episode started more than 1 year ago. The problem is unchanged. The problem is uncontrolled. Pertinent negatives include no blurred vision, headaches, palpitations or peripheral edema. Risk factors for coronary artery disease include diabetes mellitus, dyslipidemia, family history, male gender and obesity. Past treatments include angiotensin blockers. The current treatment provides mild improvement. There is no history of kidney disease, CAD/MI, CVA, heart failure or a thyroid problem. There is no history of sleep apnea.  Hyperlipidemia This is a chronic problem. The current episode started more than 1 year ago. The problem is controlled. Recent lipid tests were reviewed and are normal. Exacerbating diseases include diabetes and obesity. He has no  history of hypothyroidism. Factors aggravating his hyperlipidemia include fatty foods. Associated symptoms include leg pain. Pertinent negatives include no myalgias. Current antihyperlipidemic treatment includes statins. The current treatment provides moderate improvement of lipids. Risk factors for coronary artery disease include diabetes mellitus, dyslipidemia, family history, male sex and obesity.  Arthritis Presents for follow-up visit. The disease course has been stable. He complains of pain. Affected location: "Head to toe" His pain is at a severity of 8/10. Associated symptoms include pain at night and pain while resting. (Pt has rheumatoid and osteoarthritis and is followed by Ortho and rheumatologist every 3 months ) His past medical history is significant for osteoarthritis and rheumatoid arthritis. Past treatments include rest and an opioid. The treatment provided mild relief.    Pt has rheumatoid and osteoarthritis    Review of Systems  Constitutional: Negative.   HENT: Negative.   Eyes: Negative for blurred vision.  Respiratory: Negative.   Cardiovascular: Negative.  Negative for palpitations.  Gastrointestinal: Negative.   Endocrine: Negative.   Genitourinary: Negative.   Musculoskeletal: Positive for arthritis. Negative for myalgias.  Neurological: Negative.  Negative for dizziness and headaches.  Hematological: Negative.   Psychiatric/Behavioral: Negative.  Negative for confusion.  All other systems reviewed and are negative.      Objective:   Physical Exam  Constitutional: He is oriented to person, place, and time. He appears well-developed and well-nourished. No distress.  HENT:  Head: Normocephalic.  Right Ear: External ear normal.  Left Ear: External ear normal.  Nose: Nose normal.  Mouth/Throat: Oropharynx is clear and moist.  Eyes: Pupils are equal, round, and reactive to light. Right eye exhibits no discharge. Left eye exhibits no discharge.  Neck: Normal  range of motion. Neck supple. No thyromegaly present.  Cardiovascular: Normal rate, regular rhythm, normal heart sounds  and intact distal pulses.   No murmur heard. Pulmonary/Chest: Effort normal and breath sounds normal. No respiratory distress. He has no wheezes.  Abdominal: Soft. Bowel sounds are normal. He exhibits no distension. There is no tenderness.  Musculoskeletal: Normal range of motion. He exhibits no edema or tenderness.  Neurological: He is alert and oriented to person, place, and time. He has normal reflexes. No cranial nerve deficit.  Skin: Skin is warm and dry. No rash noted. No erythema.  Psychiatric: He has a normal mood and affect. His behavior is normal. Judgment and thought content normal.  Vitals reviewed.     BP 169/94 mmHg  Pulse 72  Temp(Src) 97.5 F (36.4 C) (Oral)  Ht 5' 10"  (1.778 m)  Wt 241 lb 6.4 oz (109.498 kg)  BMI 34.64 kg/m2     Assessment & Plan:  1. Essential hypertension - CMP14+EGFR - hydrochlorothiazide (HYDRODIURIL) 12.5 MG tablet; Take 1 tablet (12.5 mg total) by mouth daily.  Dispense: 90 tablet; Refill: 3  2. Type 2 diabetes mellitus with hyperglycemia, without long-term current use of insulin (HCC) - Bayer DCA Hb A1c Waived - CMP14+EGFR  3. Obesity - CMP14+EGFR  4. Hyperlipidemia - CMP14+EGFR - Lipid panel  5. Primary localized osteoarthritis of right knee - CMP14+EGFR  6. Rheumatoid arthritis with positive rheumatoid factor, involving unspecified site ([place]) - CMP14+EGFR  7. Psoriasis  - CMP14+EGFR   Continue all meds Labs pending Health Maintenance reviewed Diet and exercise encouraged RTO 2 weeks to recheck HTN  Evelina Dun, FNP

## 2015-06-13 NOTE — Patient Instructions (Signed)

## 2015-06-14 LAB — BAYER DCA HB A1C WAIVED

## 2015-06-14 LAB — CMP14+EGFR
A/G RATIO: 1.8 (ref 1.1–2.5)
ALBUMIN: 4.2 g/dL (ref 3.6–4.8)
ALK PHOS: 90 IU/L (ref 39–117)
ALT: 19 IU/L (ref 0–44)
AST: 19 IU/L (ref 0–40)
BILIRUBIN TOTAL: 0.3 mg/dL (ref 0.0–1.2)
BUN/Creatinine Ratio: 12 (ref 10–22)
BUN: 14 mg/dL (ref 8–27)
CO2: 20 mmol/L (ref 18–29)
CREATININE: 1.14 mg/dL (ref 0.76–1.27)
Calcium: 9.1 mg/dL (ref 8.6–10.2)
Chloride: 101 mmol/L (ref 96–106)
GFR, EST AFRICAN AMERICAN: 80 mL/min/{1.73_m2} (ref >59)
GFR, EST NON AFRICAN AMERICAN: 69 mL/min/{1.73_m2} (ref >59)
GLUCOSE: 137 mg/dL — AB (ref 65–99)
Globulin, Total: 2.4 g/dL (ref 1.5–4.5)
Potassium: 4.3 mmol/L (ref 3.5–5.2)
Sodium: 140 mmol/L (ref 134–144)
TOTAL PROTEIN: 6.6 g/dL (ref 6.0–8.5)

## 2015-06-14 LAB — LIPID PANEL
CHOL/HDL RATIO: 3 ratio (ref 0.0–5.0)
Cholesterol, Total: 113 mg/dL (ref 100–199)
HDL: 38 mg/dL — AB (ref >39)
LDL CALC: 63 mg/dL (ref 0–99)
TRIGLYCERIDES: 61 mg/dL (ref 0–149)
VLDL CHOLESTEROL CAL: 12 mg/dL (ref 5–40)

## 2015-06-16 ENCOUNTER — Other Ambulatory Visit: Payer: Self-pay | Admitting: *Deleted

## 2015-06-16 ENCOUNTER — Other Ambulatory Visit: Payer: Self-pay | Admitting: Family

## 2015-06-16 DIAGNOSIS — E1165 Type 2 diabetes mellitus with hyperglycemia: Secondary | ICD-10-CM

## 2015-06-16 LAB — BAYER DCA HB A1C WAIVED: HB A1C (BAYER DCA - WAIVED): 7.7 % — ABNORMAL HIGH (ref <7.0)

## 2015-06-16 MED ORDER — DULAGLUTIDE 0.75 MG/0.5ML ~~LOC~~ SOAJ: 0.7500 mg | SUBCUTANEOUS | Status: DC

## 2015-06-16 NOTE — Addendum Note (Signed)
Addended by: Shelbie Ammons on: 06/16/2015 11:13 AM   Modules accepted: Orders

## 2015-06-27 ENCOUNTER — Encounter: Payer: Self-pay | Admitting: Family

## 2015-06-27 ENCOUNTER — Ambulatory Visit (INDEPENDENT_AMBULATORY_CARE_PROVIDER_SITE_OTHER): Payer: BLUE CROSS/BLUE SHIELD | Admitting: Family

## 2015-06-27 VITALS — BP 141/89 | HR 91 | Temp 97.0°F | Ht 70.0 in | Wt 246.8 lb

## 2015-06-27 DIAGNOSIS — E1165 Type 2 diabetes mellitus with hyperglycemia: Secondary | ICD-10-CM | POA: Diagnosis not present

## 2015-06-27 DIAGNOSIS — I10 Essential (primary) hypertension: Secondary | ICD-10-CM

## 2015-06-27 LAB — BMP8+EGFR
BUN / CREAT RATIO: 14 (ref 10–22)
BUN: 16 mg/dL (ref 8–27)
CALCIUM: 9.2 mg/dL (ref 8.6–10.2)
CHLORIDE: 96 mmol/L (ref 96–106)
CO2: 21 mmol/L (ref 18–29)
CREATININE: 1.12 mg/dL (ref 0.76–1.27)
GFR calc Af Amer: 82 mL/min/{1.73_m2} (ref >59)
GFR calc non Af Amer: 71 mL/min/{1.73_m2} (ref >59)
GLUCOSE: 236 mg/dL — AB (ref 65–99)
Potassium: 4.3 mmol/L (ref 3.5–5.2)
Sodium: 137 mmol/L (ref 134–144)

## 2015-06-27 MED ORDER — METFORMIN HCL 1000 MG PO TABS: ORAL_TABLET | ORAL | Status: DC

## 2015-06-27 NOTE — Progress Notes (Signed)
Subjective:    Patient ID: [name], male    DOB: July 20, 1955, 60 y.o.   MRN: [id number]  Pt presents to the office today to recheck HTN. PT is not at goal today. Hypertension This is a chronic problem. The current episode started more than 1 year ago. The problem has been waxing and waning since onset. The problem is uncontrolled. Associated symptoms include malaise/fatigue. Pertinent negatives include no anxiety, blurred vision, headaches, palpitations, peripheral edema or shortness of breath. Risk factors for coronary artery disease include dyslipidemia, family history, obesity, male gender and sedentary lifestyle. Past treatments include diuretics. The current treatment provides moderate improvement. There is no history of kidney disease, CAD/MI, CVA, heart failure or a thyroid problem. There is no history of sleep apnea.  Diabetes He presents for his follow-up diabetic visit. He has type 2 diabetes mellitus. His disease course has been fluctuating. There are no hypoglycemic associated symptoms. Pertinent negatives for hypoglycemia include no headaches. Pertinent negatives for diabetes include no blurred vision, no foot paresthesias and no foot ulcerations. There are no hypoglycemic complications. Symptoms are stable. Pertinent negatives for diabetic complications include no CVA. Risk factors for coronary artery disease include dyslipidemia, diabetes mellitus, family history, hypertension, male sex, obesity and sedentary lifestyle. Current diabetic treatment includes oral agent (monotherapy). He is compliant with treatment all of the time. He is following a generally unhealthy diet. His breakfast blood glucose range is generally 140-180 mg/dl. An ACE inhibitor/angiotensin II receptor blocker is being taken.      Review of Systems  Constitutional: Positive for malaise/fatigue.  HENT: Negative.   Eyes: Negative for blurred vision.  Respiratory: Negative.  Negative for shortness of breath.     Cardiovascular: Negative.  Negative for palpitations.  Gastrointestinal: Negative.   Endocrine: Negative.   Genitourinary: Negative.   Musculoskeletal: Negative.   Neurological: Negative.  Negative for headaches.  Hematological: Negative.   Psychiatric/Behavioral: Negative.   All other systems reviewed and are negative.      Objective:   Physical Exam  Constitutional: He is oriented to person, place, and time. He appears well-developed and well-nourished. No distress.  HENT:  Head: Normocephalic.  Eyes: Pupils are equal, round, and reactive to light.  Neck: Normal range of motion. Neck supple. No thyromegaly present.  Cardiovascular: Normal rate, regular rhythm, normal heart sounds and intact distal pulses.   No murmur heard. Pulmonary/Chest: Effort normal and breath sounds normal. No respiratory distress. He has no wheezes.  Abdominal: Soft. Bowel sounds are normal. He exhibits no distension. There is no tenderness.  Musculoskeletal: Normal range of motion. He exhibits no edema or tenderness.  Neurological: He is alert and oriented to person, place, and time.  Skin: Skin is warm and dry. No rash noted. No erythema.  Psychiatric: He has a normal mood and affect. His behavior is normal. Judgment and thought content normal.  Vitals reviewed.     BP 141/89 mmHg  Pulse 91  Temp(Src) 97 F (36.1 C) (Oral)  Ht 5' 10"  (1.778 m)  Wt 246 lb 12.8 oz (111.948 kg)  BMI 35.41 kg/m2     Assessment & Plan:  1. Essential hypertension - BMP8+EGFR -Continue medications- Pt is in pain today related to arthritis. Will keep medications the same today  2. Type 2 diabetes mellitus with hyperglycemia, without long-term current use of insulin (HCC) -Pt's metformin increased to 1500 mg at dinner from 1000 mg BID -Low carb diet discussed - metFORMIN (GLUCOPHAGE) 1000 MG tablet; Take 1000 mg  with breakfast and 1500 mg with dinner  Dispense: 225 tablet; Refill: 4   Continue all meds Labs  pending Health Maintenance reviewed Diet and exercise encouraged [place], FNP

## 2015-06-27 NOTE — Patient Instructions (Signed)
Diabetes Mellitus and Food It is important for you to manage your blood sugar (glucose) level. Your blood glucose level can be greatly affected by what you eat. Eating healthier foods in the appropriate amounts throughout the day at about the same time each day will help you control your blood glucose level. It can also help slow or prevent worsening of your diabetes mellitus. Healthy eating may even help you improve the level of your blood pressure and reach or maintain a healthy weight.  General recommendations for healthful eating and cooking habits include:  Eating meals and snacks regularly. Avoid going long periods of time without eating to lose weight.  Eating a diet that consists mainly of plant-based foods, such as fruits, vegetables, nuts, legumes, and whole grains.  Using low-heat cooking methods, such as baking, instead of high-heat cooking methods, such as deep frying. Work with your dietitian to make sure you understand how to use the Nutrition Facts information on food labels. HOW CAN FOOD AFFECT ME? Carbohydrates Carbohydrates affect your blood glucose level more than any other type of food. Your dietitian will help you determine how many carbohydrates to eat at each meal and teach you how to count carbohydrates. Counting carbohydrates is important to keep your blood glucose at a healthy level, especially if you are using insulin or taking certain medicines for diabetes mellitus. Alcohol Alcohol can cause sudden decreases in blood glucose (hypoglycemia), especially if you use insulin or take certain medicines for diabetes mellitus. Hypoglycemia can be a life-threatening condition. Symptoms of hypoglycemia (sleepiness, dizziness, and disorientation) are similar to symptoms of having too much alcohol.  If your health care provider has given you approval to drink alcohol, do so in moderation and use the following guidelines:  Women should not have more than one drink per day, and men  should not have more than two drinks per day. One drink is equal to:  12 oz of beer.  5 oz of wine.  1 oz of hard liquor.  Do not drink on an empty stomach.  Keep yourself hydrated. Have water, diet soda, or unsweetened iced tea.  Regular soda, juice, and other mixers might contain a lot of carbohydrates and should be counted. WHAT FOODS ARE NOT RECOMMENDED? As you make food choices, it is important to remember that all foods are not the same. Some foods have fewer nutrients per serving than other foods, even though they might have the same number of calories or carbohydrates. It is difficult to get your body what it needs when you eat foods with fewer nutrients. Examples of foods that you should avoid that are high in calories and carbohydrates but low in nutrients include:  Trans fats (most processed foods list trans fats on the Nutrition Facts label).  Regular soda.  Juice.  Candy.  Sweets, such as cake, pie, doughnuts, and cookies.  Fried foods. WHAT FOODS CAN I EAT? Eat nutrient-rich foods, which will nourish your body and keep you healthy. The food you should eat also will depend on several factors, including:  The calories you need.  The medicines you take.  Your weight.  Your blood glucose level.  Your blood pressure level.  Your cholesterol level. You should eat a variety of foods, including:  Protein.  Lean cuts of meat.  Proteins low in saturated fats, such as fish, egg whites, and beans. Avoid processed meats.  Fruits and vegetables.  Fruits and vegetables that may help control blood glucose levels, such as apples, mangoes, and   yams.  Dairy products.  Choose fat-free or low-fat dairy products, such as milk, yogurt, and cheese.  Grains, bread, pasta, and rice.  Choose whole grain products, such as multigrain bread, whole oats, and brown rice. These foods may help control blood pressure.  Fats.  Foods containing healthful fats, such as nuts,  avocado, olive oil, canola oil, and fish. DOES EVERYONE WITH DIABETES MELLITUS HAVE THE SAME MEAL PLAN? Because every person with diabetes mellitus is different, there is not one meal plan that works for everyone. It is very important that you meet with a dietitian who will help you create a meal plan that is just right for you.   This information is not intended to replace advice given to you by your health care provider. Make sure you discuss any questions you have with your health care provider.   Document Released: 12/21/2004 Document Revised: 04/16/2014 Document Reviewed: 02/20/2013 Elsevier Interactive Patient Education 2016 [employer]. Hypertension Hypertension, commonly called high blood pressure, is when the force of blood pumping through your arteries is too strong. Your arteries are the blood vessels that carry blood from your heart throughout your body. A blood pressure reading consists of a higher number over a lower number, such as 110/72. The higher number (systolic) is the pressure inside your arteries when your heart pumps. The lower number (diastolic) is the pressure inside your arteries when your heart relaxes. Ideally you want your blood pressure below 120/80. Hypertension forces your heart to work harder to pump blood. Your arteries may become narrow or stiff. Having untreated or uncontrolled hypertension can cause heart attack, stroke, kidney disease, and other problems. RISK FACTORS Some risk factors for high blood pressure are controllable. Others are not.  Risk factors you cannot control include:   Race. You may be at higher risk if you are African American.  Age. Risk increases with age.  Gender. Men are at higher risk than women before age 24 years. After age 70, women are at higher risk than men. Risk factors you can control include:  Not getting enough exercise or physical activity.  Being overweight.  Getting too much fat, sugar, calories, or salt in your  diet.  Drinking too much alcohol. SIGNS AND SYMPTOMS Hypertension does not usually cause signs or symptoms. Extremely high blood pressure (hypertensive crisis) may cause headache, anxiety, shortness of breath, and nosebleed. DIAGNOSIS To check if you have hypertension, your health care provider will measure your blood pressure while you are seated, with your arm held at the level of your heart. It should be measured at least twice using the same arm. Certain conditions can cause a difference in blood pressure between your right and left arms. A blood pressure reading that is higher than normal on one occasion does not mean that you need treatment. If it is not clear whether you have high blood pressure, you may be asked to return on a different day to have your blood pressure checked again. Or, you may be asked to monitor your blood pressure at home for 1 or more weeks. TREATMENT Treating high blood pressure includes making lifestyle changes and possibly taking medicine. Living a healthy lifestyle can help lower high blood pressure. You may need to change some of your habits. Lifestyle changes may include:  Following the DASH diet. This diet is high in fruits, vegetables, and whole grains. It is low in salt, red meat, and added sugars.  Keep your sodium intake below 2,300 mg per day.  Getting  at least 30-45 minutes of aerobic exercise at least 4 times per week.  Losing weight if necessary.  Not smoking.  Limiting alcoholic beverages.  Learning ways to reduce stress. Your health care provider may prescribe medicine if lifestyle changes are not enough to get your blood pressure under control, and if one of the following is true:  You are 21-106 years of age and your systolic blood pressure is above 140.  You are 35 years of age or older, and your systolic blood pressure is above 150.  Your diastolic blood pressure is above 90.  You have diabetes, and your systolic blood pressure is over  [id number] or your diastolic blood pressure is over 90.  You have kidney disease and your blood pressure is above 140/90.  You have heart disease and your blood pressure is above 140/90. Your personal target blood pressure may vary depending on your medical conditions, your age, and other factors. HOME CARE INSTRUCTIONS  Have your blood pressure rechecked as directed by your health care provider.   Take medicines only as directed by your health care provider. Follow the directions carefully. Blood pressure medicines must be taken as prescribed. The medicine does not work as well when you skip doses. Skipping doses also puts you at risk for problems.  Do not smoke.   Monitor your blood pressure at home as directed by your health care provider. SEEK MEDICAL CARE IF:   You think you are having a reaction to medicines taken.  You have recurrent headaches or feel dizzy.  You have swelling in your ankles.  You have trouble with your vision. SEEK IMMEDIATE MEDICAL CARE IF:  You develop a severe headache or confusion.  You have unusual weakness, numbness, or feel faint.  You have severe chest or abdominal pain.  You vomit repeatedly.  You have trouble breathing. MAKE SURE YOU:   Understand these instructions.  Will watch your condition.  Will get help right away if you are not doing well or get worse.   This information is not intended to replace advice given to you by your health care provider. Make sure you discuss any questions you have with your health care provider.   Document Released: 03/26/2005 Document Revised: 08/10/2014 Document Reviewed: 01/16/2013 Elsevier Interactive Patient Education [employer].

## 2015-07-21 DIAGNOSIS — M0589 Other rheumatoid arthritis with rheumatoid factor of multiple sites: Secondary | ICD-10-CM | POA: Diagnosis not present

## 2015-07-21 DIAGNOSIS — M0609 Rheumatoid arthritis without rheumatoid factor, multiple sites: Secondary | ICD-10-CM | POA: Diagnosis not present

## 2015-08-10 ENCOUNTER — Other Ambulatory Visit: Payer: Self-pay | Admitting: Family

## 2015-08-11 NOTE — Telephone Encounter (Signed)
Last seen 06/27/15  [facility]

## 2015-08-18 DIAGNOSIS — M0589 Other rheumatoid arthritis with rheumatoid factor of multiple sites: Secondary | ICD-10-CM | POA: Diagnosis not present

## 2015-08-23 DIAGNOSIS — M150 Primary generalized (osteo)arthritis: Secondary | ICD-10-CM | POA: Diagnosis not present

## 2015-08-23 DIAGNOSIS — Z79899 Other long term (current) drug therapy: Secondary | ICD-10-CM | POA: Diagnosis not present

## 2015-08-23 DIAGNOSIS — M2550 Pain in unspecified joint: Secondary | ICD-10-CM | POA: Diagnosis not present

## 2015-08-23 DIAGNOSIS — M7989 Other specified soft tissue disorders: Secondary | ICD-10-CM | POA: Diagnosis not present

## 2015-08-23 DIAGNOSIS — M0609 Rheumatoid arthritis without rheumatoid factor, multiple sites: Secondary | ICD-10-CM | POA: Diagnosis not present

## 2015-09-15 DIAGNOSIS — M0589 Other rheumatoid arthritis with rheumatoid factor of multiple sites: Secondary | ICD-10-CM | POA: Diagnosis not present

## 2015-09-15 DIAGNOSIS — M19011 Primary osteoarthritis, right shoulder: Secondary | ICD-10-CM | POA: Diagnosis not present

## 2015-09-15 DIAGNOSIS — M545 Low back pain: Secondary | ICD-10-CM | POA: Diagnosis not present

## 2015-09-30 ENCOUNTER — Ambulatory Visit: Payer: BLUE CROSS/BLUE SHIELD | Admitting: Family

## 2015-10-03 ENCOUNTER — Ambulatory Visit (INDEPENDENT_AMBULATORY_CARE_PROVIDER_SITE_OTHER): Payer: BLUE CROSS/BLUE SHIELD | Admitting: Family

## 2015-10-03 ENCOUNTER — Encounter: Payer: Self-pay | Admitting: Family

## 2015-10-03 VITALS — BP 124/82 | HR 69 | Temp 97.7°F | Ht 70.0 in | Wt 242.6 lb

## 2015-10-03 DIAGNOSIS — E1165 Type 2 diabetes mellitus with hyperglycemia: Secondary | ICD-10-CM | POA: Diagnosis not present

## 2015-10-03 DIAGNOSIS — E785 Hyperlipidemia, unspecified: Secondary | ICD-10-CM | POA: Diagnosis not present

## 2015-10-03 DIAGNOSIS — E669 Obesity, unspecified: Secondary | ICD-10-CM | POA: Diagnosis not present

## 2015-10-03 DIAGNOSIS — M059 Rheumatoid arthritis with rheumatoid factor, unspecified: Secondary | ICD-10-CM | POA: Diagnosis not present

## 2015-10-03 DIAGNOSIS — M1711 Unilateral primary osteoarthritis, right knee: Secondary | ICD-10-CM

## 2015-10-03 DIAGNOSIS — I10 Essential (primary) hypertension: Secondary | ICD-10-CM | POA: Diagnosis not present

## 2015-10-03 LAB — BAYER DCA HB A1C WAIVED: HB A1C: 8.3 % — AB (ref <7.0)

## 2015-10-03 MED ORDER — MOMETASONE FUROATE 50 MCG/ACT NA SUSP: 2.0000 | Freq: Every day | NASAL | Status: DC

## 2015-10-03 NOTE — Progress Notes (Signed)
Subjective:    Patient ID: [name], male    DOB: Apr 07, 1956, 60 y.o.   MRN: [id number]  PT presents to the office today for chronic follow up. Pt has rheumatoid arthritis and followed by rheumatologists every 3 months.    Diabetes He presents for his follow-up diabetic visit. He has type 2 diabetes mellitus. His disease course has been improving. Pertinent negatives for hypoglycemia include no confusion, dizziness or headaches. Pertinent negatives for diabetes include no blurred vision, no foot paresthesias, no foot ulcerations and no visual change. Pertinent negatives for hypoglycemia complications include no blackouts. Symptoms are worsening. Pertinent negatives for diabetic complications include no CVA, heart disease, nephropathy or peripheral neuropathy. Risk factors for coronary artery disease include diabetes mellitus, dyslipidemia, hypertension, male sex and family history. Current diabetic treatment includes oral agent (monotherapy). He is compliant with treatment all of the time. He is following a generally healthy diet. His breakfast blood glucose range is generally 140-180 mg/dl. An ACE inhibitor/angiotensin II receptor blocker is being taken. Eye exam is current.  Hypertension This is a chronic problem. The current episode started more than 1 year ago. The problem is unchanged. The problem is controlled. Pertinent negatives include no blurred vision, headaches, malaise/fatigue, palpitations or peripheral edema. Risk factors for coronary artery disease include diabetes mellitus, dyslipidemia, family history, male gender and obesity. Past treatments include angiotensin blockers. The current treatment provides mild improvement. There is no history of kidney disease, CAD/MI, CVA, heart failure or a thyroid problem. There is no history of sleep apnea.  Hyperlipidemia This is a chronic problem. The current episode started more than 1 year ago. The problem is controlled. Recent lipid tests  were reviewed and are normal. Exacerbating diseases include diabetes and obesity. He has no history of hypothyroidism. Factors aggravating his hyperlipidemia include fatty foods. Associated symptoms include leg pain. Pertinent negatives include no myalgias. Current antihyperlipidemic treatment includes statins. The current treatment provides moderate improvement of lipids. Risk factors for coronary artery disease include diabetes mellitus, dyslipidemia, family history, male sex and obesity.  Arthritis Presents for follow-up visit. The disease course has been stable. He complains of pain. Affected location: "Head to toe" His pain is at a severity of 6/10. Associated symptoms include pain at night and pain while resting. (Pt has rheumatoid and osteoarthritis and is followed by Ortho and rheumatologist every 3 months ) His past medical history is significant for osteoarthritis and rheumatoid arthritis. Past treatments include rest and an opioid. The treatment provided mild relief.       Review of Systems  Constitutional: Negative.  Negative for malaise/fatigue.  HENT: Negative.   Eyes: Negative for blurred vision.  Respiratory: Negative.   Cardiovascular: Negative.  Negative for palpitations.  Gastrointestinal: Negative.   Endocrine: Negative.   Genitourinary: Negative.   Musculoskeletal: Positive for arthritis. Negative for myalgias.  Neurological: Negative.  Negative for dizziness and headaches.  Hematological: Negative.   Psychiatric/Behavioral: Negative.  Negative for confusion.  All other systems reviewed and are negative.      Objective:   Physical Exam  Constitutional: He is oriented to person, place, and time. He appears well-developed and well-nourished. No distress.  HENT:  Head: Normocephalic.  Right Ear: External ear normal.  Left Ear: External ear normal.  Nose: Nose normal.  Mouth/Throat: Oropharynx is clear and moist.  Eyes: Pupils are equal, round, and reactive to  light. Right eye exhibits no discharge. Left eye exhibits no discharge.  Neck: Normal range of motion. Neck supple.  No thyromegaly present.  Cardiovascular: Normal rate, regular rhythm, normal heart sounds and intact distal pulses.   No murmur heard. Pulmonary/Chest: Effort normal and breath sounds normal. No respiratory distress. He has no wheezes.  Abdominal: Soft. Bowel sounds are normal. He exhibits no distension. There is no tenderness.  Musculoskeletal: Normal range of motion. He exhibits no edema or tenderness.  Neurological: He is alert and oriented to person, place, and time. He has normal reflexes. No cranial nerve deficit.  Skin: Skin is warm and dry. No rash noted. No erythema.  Psychiatric: He has a normal mood and affect. His behavior is normal. Judgment and thought content normal.  Vitals reviewed.     BP 124/82 mmHg  Pulse 69  Temp(Src) 97.7 F (36.5 C) (Oral)  Ht _0  (1.778 m)  Wt 242 lb 9.6 oz (110.043 kg)  BMI 34.81 kg/m2     Assessment & Plan:  1. Essential hypertension - CMP14+EGFR  2. Type 2 diabetes mellitus with hyperglycemia, without long-term current use of insulin (HCC) - Bayer DCA Hb A1c Waived - CMP14+EGFR - Microalbumin / creatinine urine ratio  3. Primary localized osteoarthritis of right knee - CMP14+EGFR  4. Hyperlipidemia - CMP14+EGFR - Lipid panel  5. Obesity - CMP14+EGFR  6. Rheumatoid arthritis with positive rheumatoid factor, involving unspecified site ([place]) - CMP14+EGFR   Continue all meds Labs pending Health Maintenance reviewed Diet and exercise encouraged RTO 3 months  Evelina Dun, FNP

## 2015-10-03 NOTE — Patient Instructions (Signed)
Varicella-Zoster Immune Globulin (Human) injection solution What is this medicine? VARICELLA-ZOSTER IMMUNE GLOBULIN (var uh SEL uh - ZOS ter i MYOON GLOB yoo lin) helps to reduce the severity of chickenpox infections in patients who are at risk. This medicine is collected from the pooled blood of many donors. This medicine may be used for other purposes; ask your health care provider or pharmacist if you have questions. What should I tell my health care provider before I take this medicine? They need to know if you have any of these conditions: -bleeding disorder -diabetes -heart disease -high cholesterol -history of blood clots -IgA deficiency -low blood counts, like low white cell, platelet, or red cell counts -recently received or scheduled to receive a vaccine -an unusual or allergic reaction to varicella-zoster immune globulin, other immune globulins, other medicines, foods, dyes, or preservatives -pregnant or trying to get pregnant -breast-feeding How should I use this medicine? This medicine is for injection into a muscle. It is usually given by a health care professional in a hospital or clinic setting. Talk to your pediatrician regarding the use of this medicine in children. While this drug may be prescribed for newborns for selected conditions, precautions do apply. Overdosage: If you think you have taken too much of this medicine contact a poison control center or emergency room at once. NOTE: This medicine is only for you. Do not share this medicine with others. What if I miss a dose? This does not apply. What may interact with this medicine? -live virus vaccines This list may not describe all possible interactions. Give your health care provider a list of all the medicines, herbs, non-prescription drugs, or dietary supplements you use. Also tell them if you smoke, drink alcohol, or use illegal drugs. Some items may interact with your medicine. What should I watch for while  using this medicine? This medicine is made from human blood. It may be possible to pass an infection in this medicine, but no cases have been reported. Talk to your doctor about the risks and benefits of this medicine. This medicine can decrease the response to a vaccine. If you need to get vaccinated, tell your healthcare professional if you have received this medicine within the last 3 months. Extra booster doses may be needed. Talk to your doctor to see if a different vaccination schedule is needed. Your condition will be monitored carefully while you are receiving this medicine. What side effects may I notice from receiving this medicine? Side effects that you should report to your doctor or health care professional as soon as possible: -allergic reactions like skin rash, itching or hives, swelling of the face, lips, or tongue -shortness of breath, chest pain, swelling in a leg Side effects that usually do not require medical attention (Report these to your doctor or health care professional if they continue or are bothersome.): -chills -headache -pain, redness, or irritation at site where injected This list may not describe all possible side effects. Call your doctor for medical advice about side effects. You may report side effects to FDA at 1-800-FDA-1088. Where should I keep my medicine? This drug is given in a hospital or clinic and will not be stored at home. NOTE: This sheet is a summary. It may not cover all possible information. If you have questions about this medicine, talk to your doctor, pharmacist, or health care provider.    2016, Elsevier/Gold Standard. (2012-03-11 16:38:51)

## 2015-10-04 ENCOUNTER — Other Ambulatory Visit: Payer: Self-pay | Admitting: Family

## 2015-10-04 LAB — CMP14+EGFR
ALT: 18 IU/L (ref 0–44)
AST: 18 IU/L (ref 0–40)
Albumin/Globulin Ratio: 1.8 (ref 1.2–2.2)
Albumin: 4.3 g/dL (ref 3.6–4.8)
Alkaline Phosphatase: 75 IU/L (ref 39–117)
BILIRUBIN TOTAL: 0.3 mg/dL (ref 0.0–1.2)
BUN/Creatinine Ratio: 17 (ref 10–24)
BUN: 18 mg/dL (ref 8–27)
CHLORIDE: 99 mmol/L (ref 96–106)
CO2: 24 mmol/L (ref 18–29)
Calcium: 9.3 mg/dL (ref 8.6–10.2)
Creatinine, Ser: 1.09 mg/dL (ref 0.76–1.27)
GFR calc non Af Amer: 72 mL/min/{1.73_m2} (ref >59)
GFR, EST AFRICAN AMERICAN: 84 mL/min/{1.73_m2} (ref >59)
GLUCOSE: 155 mg/dL — AB (ref 65–99)
Globulin, Total: 2.4 g/dL (ref 1.5–4.5)
POTASSIUM: 5 mmol/L (ref 3.5–5.2)
Sodium: 138 mmol/L (ref 134–144)
TOTAL PROTEIN: 6.7 g/dL (ref 6.0–8.5)

## 2015-10-04 LAB — LIPID PANEL
CHOLESTEROL TOTAL: 137 mg/dL (ref 100–199)
Chol/HDL Ratio: 3 ratio units (ref 0.0–5.0)
HDL: 46 mg/dL (ref >39)
LDL Calculated: 78 mg/dL (ref 0–99)
TRIGLYCERIDES: 66 mg/dL (ref 0–149)
VLDL CHOLESTEROL CAL: 13 mg/dL (ref 5–40)

## 2015-10-04 LAB — MICROALBUMIN / CREATININE URINE RATIO
CREATININE, UR: 135.6 mg/dL
MICROALB/CREAT RATIO: 2.6 mg/g creat (ref 0.0–30.0)
Microalbumin, Urine: 3.5 ug/mL

## 2015-10-04 MED ORDER — DULAGLUTIDE 1.5 MG/0.5ML ~~LOC~~ SOAJ: 1.5000 mg | SUBCUTANEOUS | Status: DC

## 2015-10-17 DIAGNOSIS — M0609 Rheumatoid arthritis without rheumatoid factor, multiple sites: Secondary | ICD-10-CM | POA: Diagnosis not present

## 2015-10-24 ENCOUNTER — Ambulatory Visit (INDEPENDENT_AMBULATORY_CARE_PROVIDER_SITE_OTHER): Payer: BLUE CROSS/BLUE SHIELD | Admitting: Family Medicine

## 2015-10-24 ENCOUNTER — Encounter: Payer: Self-pay | Admitting: Family Medicine

## 2015-10-24 VITALS — BP 143/85 | HR 83 | Temp 99.1°F | Ht 70.0 in | Wt 242.0 lb

## 2015-10-24 DIAGNOSIS — S3094XA Unspecified superficial injury of scrotum and testes, initial encounter: Secondary | ICD-10-CM

## 2015-10-24 DIAGNOSIS — R198 Other specified symptoms and signs involving the digestive system and abdomen: Secondary | ICD-10-CM

## 2015-10-24 DIAGNOSIS — W57XXXA Bitten or stung by nonvenomous insect and other nonvenomous arthropods, initial encounter: Secondary | ICD-10-CM | POA: Diagnosis not present

## 2015-10-24 DIAGNOSIS — A609 Anogenital herpesviral infection, unspecified: Secondary | ICD-10-CM | POA: Diagnosis not present

## 2015-10-24 DIAGNOSIS — A6002 Herpesviral infection of other male genital organs: Secondary | ICD-10-CM

## 2015-10-24 DIAGNOSIS — R3989 Other symptoms and signs involving the genitourinary system: Secondary | ICD-10-CM

## 2015-10-24 MED ORDER — DOXYCYCLINE HYCLATE 100 MG PO TABS: 100.0000 mg | ORAL_TABLET | Freq: Two times a day (BID) | ORAL | Status: DC

## 2015-10-24 NOTE — Progress Notes (Signed)
BP 143/85 mmHg  Pulse 83  Temp(Src) 99.1 F (37.3 C) (Oral)  Ht 5\' 10"  (1.778 m)  Wt 242 lb (109.77 kg)  BMI 34.72 kg/m2   Subjective:    Patient ID: [name], [name]    DOB: [birth year], 60 y.o.   MRN: [id number]  HPI: [name] is a 60 y.o. [name] presenting on 10/24/2015 for Abdominal Pain and Tick Removal   HPI Genital sore Patient had told the nurse that he was having abdominal pain when he was actually just shy about where he is actually having pain. He has [name] been having a genital sore on the anterior shaft of his penis that it's been there about 3 or 4 days. He said he pulled tick off of his genital region about 6 weeks ago and it's for a while but the source and pop up until just recently. He denies any penile drainage. He denies any myalgias or weakness or fatigue is been going on recently.  Relevant past medical, surgical, family and social history reviewed and updated as indicated. Interim medical history since our last visit reviewed. Allergies and medications reviewed and updated.  Review of Systems  Constitutional: Negative for fever.  HENT: Negative for ear discharge and ear pain.   Eyes: Negative for discharge and visual disturbance.  Respiratory: Negative for shortness of breath and wheezing.   Cardiovascular: Negative for chest pain and leg swelling.  Gastrointestinal: Negative for abdominal pain, constipation and diarrhea.  Genitourinary: Positive for genital sores. Negative for difficulty urinating, flank pain, frequency and urgency.  Musculoskeletal: Negative for back pain and gait problem.  Skin: Negative for rash.  Neurological: Negative for syncope, light-headedness and headaches.  All other systems reviewed and are negative.   Per HPI unless specifically indicated above     Medication List       This list is accurate as of: 10/24/15  6:28 PM.  Always use your most recent med list.               abatacept in sodium chloride 0.9 %    Inject into the vein every 30 (thirty) days. Reported on 06/27/2015     benzonatate 200 MG capsule  Commonly known as:  TESSALON  TAKE ONE CAPSULE BY MOUTH THREE TIMES DAILY AS NEEDED     doxycycline 100 MG tablet  Commonly known as:  VIBRA-TABS  Take 1 tablet (100 mg total) by mouth 2 (two) times daily. 1 po bid     Dulaglutide 1.5 MG/0.5ML Sopn  Commonly known as:  TRULICITY  Inject 1.5 mg into the skin once a week.     EPINEPHrine 0.3 mg/0.3 mL Soaj injection  Commonly known as:  EPIPEN 2-PAK  USE AS DIRECTED AS NEEDED     hydrochlorothiazide 12.5 MG tablet  Commonly known as:  HYDRODIURIL  Take 1 tablet (12.5 mg total) by mouth daily.     losartan 100 MG tablet  Commonly known as:  COZAAR  Take 1 tablet (100 mg total) by mouth daily.     metFORMIN 1000 MG tablet  Commonly known as:  GLUCOPHAGE  Take 1000 mg with breakfast and 1500 mg with dinner     mometasone 50 MCG/ACT nasal spray  Commonly known as:  NASONEX  Place 2 sprays into the nose daily.     oxyCODONE-acetaminophen 10-325 MG tablet  Commonly known as:  PERCOCET  Take 1-2 tablets by mouth every 6 (six) hours as needed for pain. MAXIMUM TOTAL ACETAMINOPHEN  DOSE IS 4000 MG PER DAY     PENNSAID 2 % Soln  Generic drug:  Diclofenac Sodium  APPLY 2 PUMPS (2 GRAMS) TO AFFECTED AREA TOPICALLY TWICE DAILY AS DIRECTED     simvastatin 10 MG tablet  Commonly known as:  ZOCOR  Take 1 tablet (10 mg total) by mouth daily.           Objective:    BP 143/85 mmHg  Pulse 83  Temp(Src) 99.1 F (37.3 C) (Oral)  Ht 5\' 10"  (1.778 m)  Wt 242 lb (109.77 kg)  BMI 34.72 kg/m2  Wt Readings from Last 3 Encounters:  10/24/15 242 lb (109.77 kg)  10/03/15 242 lb 9.6 oz (110.043 kg)  06/27/15 246 lb 12.8 oz (111.948 kg)    Physical Exam  Constitutional: He is oriented to person, place, and time. He appears well-developed and well-nourished. No distress.  Eyes: Conjunctivae and EOM are normal. Pupils are equal, round,  and reactive to light. Right eye exhibits no discharge. No scleral icterus.  Neck: Neck supple. No thyromegaly present.  Cardiovascular: Normal rate, regular rhythm, normal heart sounds and intact distal pulses.   No murmur heard. Pulmonary/Chest: Effort normal and breath sounds normal. No respiratory distress. He has no wheezes.  Genitourinary: Testes normal.  Genitourinary Comments: 3 small ulcerations on the anterior shaft of the penis  Musculoskeletal: Normal range of motion. He exhibits no edema.  Lymphadenopathy:    He has no cervical adenopathy.  Neurological: He is alert and oriented to person, place, and time. Coordination normal.  Skin: Skin is warm and dry. No rash noted. He is not diaphoretic.  Psychiatric: He has a normal mood and affect. His behavior is normal.  Nursing note and vitals reviewed.     Assessment & Plan:       Problem List Items Addressed This Visit    None    Visit Diagnoses    Tick bite    -  Primary    Patient had a few tick bites + symptoms that could be like a UTI, will treat with oxycodone    Relevant Medications    doxycycline (VIBRA-TABS) 100 MG tablet    Genital sore        Relevant Orders    Anaerobic and Aerobic Culture        Follow up plan: Return if symptoms worsen or fail to improve.  Counseling provided for all of the vaccine components Orders Placed This Encounter  Procedures  . Anaerobic and Aerobic Culture    Caryl Pina, MD [facility] 10/24/2015, 6:28 PM

## 2015-10-27 LAB — HSV CULTURE, NO TYPING: HSV Culture Without Typing: POSITIVE — AB

## 2015-10-29 LAB — ANAEROBIC AND AEROBIC CULTURE

## 2015-10-31 DIAGNOSIS — M19011 Primary osteoarthritis, right shoulder: Secondary | ICD-10-CM | POA: Diagnosis not present

## 2015-10-31 MED ORDER — VALACYCLOVIR HCL 1 G PO TABS: 1000.0000 mg | ORAL_TABLET | Freq: Two times a day (BID) | ORAL | 0 refills | Status: DC

## 2015-11-01 ENCOUNTER — Telehealth: Payer: Self-pay | Admitting: Family

## 2015-11-03 ENCOUNTER — Ambulatory Visit (INDEPENDENT_AMBULATORY_CARE_PROVIDER_SITE_OTHER): Payer: BLUE CROSS/BLUE SHIELD | Admitting: Family Medicine

## 2015-11-03 ENCOUNTER — Encounter: Payer: Self-pay | Admitting: Family Medicine

## 2015-11-03 VITALS — BP 136/72 | HR 81 | Temp 97.7°F | Ht 70.0 in | Wt 244.6 lb

## 2015-11-03 DIAGNOSIS — A6000 Herpesviral infection of urogenital system, unspecified: Secondary | ICD-10-CM

## 2015-11-07 NOTE — Progress Notes (Signed)
BP 136/72 (BP Location: Left Arm, Patient Position: Sitting, Cuff Size: Large)   Pulse 81   Temp 97.7 F (36.5 C) (Oral)   Ht 5\' 10"  (1.778 m)   Wt 244 lb 9.6 oz (110.9 kg)   BMI 35.10 kg/m    Subjective:    Patient ID: [name], male    DOB: [birth year], 60 y.o.   MRN: [id number]  HPI: [name] is a 60 y.o. male presenting on 11/03/2015 for Discuss labs   HPI Genital herpes follow-up Patient was diagnosed with genital herpes on his last visit from a swab and 3 sores that he has on the anterior aspect of his penis. He did start taking the valacyclovir. He does have some irritation down there but denies any significant pain. He is more common in today for education and answering questions on the genital herpes or anything that he may have. He denies any dysuria or hematuria.  Relevant past medical, surgical, family and social history reviewed and updated as indicated. Interim medical history since our last visit reviewed. Allergies and medications reviewed and updated.  Review of Systems  Constitutional: Negative for fever.  HENT: Negative for ear discharge and ear pain.   Eyes: Negative for discharge and visual disturbance.  Respiratory: Negative for shortness of breath and wheezing.   Cardiovascular: Negative for chest pain and leg swelling.  Gastrointestinal: Negative for abdominal pain, constipation and diarrhea.  Genitourinary: Negative for difficulty urinating.  Musculoskeletal: Negative for back pain and gait problem.  Skin: Positive for wound. Negative for rash.  Neurological: Negative for syncope, light-headedness and headaches.  All other systems reviewed and are negative.   Per HPI unless specifically indicated above     Medication List       Accurate as of 11/03/15 11:59 PM. Always use your most recent med list.          abatacept in sodium chloride 0.9 % Inject into the vein every 30 (thirty) days. Reported on 06/27/2015   benzonatate 200 MG  capsule Commonly known as:  TESSALON TAKE ONE CAPSULE BY MOUTH THREE TIMES DAILY AS NEEDED   doxycycline 100 MG tablet Commonly known as:  VIBRA-TABS Take 1 tablet (100 mg total) by mouth 2 (two) times daily. 1 po bid   Dulaglutide 1.5 MG/0.5ML Sopn Commonly known as:  TRULICITY Inject 1.5 mg into the skin once a week.   EPINEPHrine 0.3 mg/0.3 mL Soaj injection Commonly known as:  EPIPEN 2-PAK USE AS DIRECTED AS NEEDED   hydrochlorothiazide 12.5 MG tablet Commonly known as:  HYDRODIURIL Take 1 tablet (12.5 mg total) by mouth daily.   losartan 100 MG tablet Commonly known as:  COZAAR Take 1 tablet (100 mg total) by mouth daily.   metFORMIN 1000 MG tablet Commonly known as:  GLUCOPHAGE Take 1000 mg with breakfast and 1500 mg with dinner   mometasone 50 MCG/ACT nasal spray Commonly known as:  NASONEX Place 2 sprays into the nose daily.   oxyCODONE-acetaminophen 10-325 MG tablet Commonly known as:  PERCOCET Take 1-2 tablets by mouth every 6 (six) hours as needed for pain. MAXIMUM TOTAL ACETAMINOPHEN DOSE IS 4000 MG PER DAY   PENNSAID 2 % Soln Generic drug:  Diclofenac Sodium APPLY 2 PUMPS (2 GRAMS) TO AFFECTED AREA TOPICALLY TWICE DAILY AS DIRECTED   simvastatin 10 MG tablet Commonly known as:  ZOCOR Take 1 tablet (10 mg total) by mouth daily.   valACYclovir 1000 MG tablet Commonly known as:  VALTREX Take  1 tablet (1,000 mg total) by mouth 2 (two) times daily.          Objective:    BP 136/72 (BP Location: Left Arm, Patient Position: Sitting, Cuff Size: Large)   Pulse 81   Temp 97.7 F (36.5 C) (Oral)   Ht 5\' 10"  (1.778 m)   Wt 244 lb 9.6 oz (110.9 kg)   BMI 35.10 kg/m   Wt Readings from Last 3 Encounters:  11/03/15 244 lb 9.6 oz (110.9 kg)  10/24/15 242 lb (109.8 kg)  10/03/15 242 lb 9.6 oz (110 kg)    Physical Exam  Constitutional: He is oriented to person, place, and time. He appears well-developed and well-nourished. No distress.  Eyes:  Conjunctivae and EOM are normal. Pupils are equal, round, and reactive to light. Right eye exhibits no discharge. No scleral icterus.  Neck: Neck supple. No thyromegaly present.  Cardiovascular: Normal rate, regular rhythm, normal heart sounds and intact distal pulses.   No murmur heard. Pulmonary/Chest: Effort normal and breath sounds normal. No respiratory distress. He has no wheezes.  Musculoskeletal: Normal range of motion. He exhibits no edema.  Lymphadenopathy:    He has no cervical adenopathy.  Neurological: He is alert and oriented to person, place, and time. Coordination normal.  Skin: Skin is warm and dry. Lesion (Unchanged lesions.) noted. No rash noted. He is not diaphoretic.  Psychiatric: He has a normal mood and affect. His behavior is normal.  Nursing note and vitals reviewed.   Results for orders placed or performed in visit on 10/24/15  Anaerobic and Aerobic Culture  Result Value Ref Range   Anaerobic Culture Final report    Result 1 Comment    Aerobic Culture Final report    Result 1 Routine flora   HSV Culture, No Typing  Result Value Ref Range   HSV Culture Without Typing Positive (A)       Assessment & Plan:   Problem List Items Addressed This Visit    None    Visit Diagnoses    Genital herpes    -  Primary       Follow up plan: Return if symptoms worsen or fail to improve.  Counseling provided for all of the vaccine components No orders of the defined types were placed in this encounter.   Caryl Pina, MD [facility] 11/03/2015, 4:27 PM

## 2015-11-24 DIAGNOSIS — Z79899 Other long term (current) drug therapy: Secondary | ICD-10-CM | POA: Diagnosis not present

## 2015-11-24 DIAGNOSIS — M0609 Rheumatoid arthritis without rheumatoid factor, multiple sites: Secondary | ICD-10-CM | POA: Diagnosis not present

## 2015-12-21 DIAGNOSIS — M069 Rheumatoid arthritis, unspecified: Secondary | ICD-10-CM | POA: Diagnosis not present

## 2015-12-21 DIAGNOSIS — Z7982 Long term (current) use of aspirin: Secondary | ICD-10-CM | POA: Diagnosis not present

## 2015-12-21 DIAGNOSIS — R0789 Other chest pain: Secondary | ICD-10-CM | POA: Diagnosis not present

## 2015-12-21 DIAGNOSIS — Z79899 Other long term (current) drug therapy: Secondary | ICD-10-CM | POA: Diagnosis not present

## 2015-12-21 DIAGNOSIS — E119 Type 2 diabetes mellitus without complications: Secondary | ICD-10-CM | POA: Diagnosis not present

## 2015-12-21 DIAGNOSIS — Z7984 Long term (current) use of oral hypoglycemic drugs: Secondary | ICD-10-CM | POA: Diagnosis not present

## 2015-12-21 DIAGNOSIS — I10 Essential (primary) hypertension: Secondary | ICD-10-CM | POA: Diagnosis not present

## 2015-12-21 DIAGNOSIS — E669 Obesity, unspecified: Secondary | ICD-10-CM | POA: Diagnosis not present

## 2015-12-22 DIAGNOSIS — Z79899 Other long term (current) drug therapy: Secondary | ICD-10-CM | POA: Diagnosis not present

## 2015-12-22 DIAGNOSIS — M0609 Rheumatoid arthritis without rheumatoid factor, multiple sites: Secondary | ICD-10-CM | POA: Diagnosis not present

## 2015-12-26 DIAGNOSIS — Z79899 Other long term (current) drug therapy: Secondary | ICD-10-CM | POA: Diagnosis not present

## 2015-12-26 DIAGNOSIS — M0609 Rheumatoid arthritis without rheumatoid factor, multiple sites: Secondary | ICD-10-CM | POA: Diagnosis not present

## 2015-12-26 DIAGNOSIS — M2550 Pain in unspecified joint: Secondary | ICD-10-CM | POA: Diagnosis not present

## 2015-12-26 DIAGNOSIS — M150 Primary generalized (osteo)arthritis: Secondary | ICD-10-CM | POA: Diagnosis not present

## 2016-01-05 ENCOUNTER — Ambulatory Visit (INDEPENDENT_AMBULATORY_CARE_PROVIDER_SITE_OTHER): Payer: BLUE CROSS/BLUE SHIELD | Admitting: Family

## 2016-01-05 ENCOUNTER — Encounter: Payer: Self-pay | Admitting: Family

## 2016-01-05 VITALS — BP 130/81 | HR 70 | Temp 97.2°F | Ht 70.0 in | Wt 242.2 lb

## 2016-01-05 DIAGNOSIS — E1165 Type 2 diabetes mellitus with hyperglycemia: Secondary | ICD-10-CM

## 2016-01-05 DIAGNOSIS — M059 Rheumatoid arthritis with rheumatoid factor, unspecified: Secondary | ICD-10-CM | POA: Diagnosis not present

## 2016-01-05 DIAGNOSIS — E785 Hyperlipidemia, unspecified: Secondary | ICD-10-CM

## 2016-01-05 DIAGNOSIS — M1711 Unilateral primary osteoarthritis, right knee: Secondary | ICD-10-CM

## 2016-01-05 DIAGNOSIS — I10 Essential (primary) hypertension: Secondary | ICD-10-CM | POA: Diagnosis not present

## 2016-01-05 LAB — BAYER DCA HB A1C WAIVED: HB A1C (BAYER DCA - WAIVED): 8.3 % — ABNORMAL HIGH (ref <7.0)

## 2016-01-05 NOTE — Patient Instructions (Signed)
Diabetes Mellitus and Food It is important for you to manage your blood sugar (glucose) level. Your blood glucose level can be greatly affected by what you eat. Eating healthier foods in the appropriate amounts throughout the day at about the same time each day will help you control your blood glucose level. It can also help slow or prevent worsening of your diabetes mellitus. Healthy eating may even help you improve the level of your blood pressure and reach or maintain a healthy weight.  General recommendations for healthful eating and cooking habits include:  Eating meals and snacks regularly. Avoid going long periods of time without eating to lose weight.  Eating a diet that consists mainly of plant-based foods, such as fruits, vegetables, nuts, legumes, and whole grains.  Using low-heat cooking methods, such as baking, instead of high-heat cooking methods, such as deep frying. Work with your dietitian to make sure you understand how to use the Nutrition Facts information on food labels. HOW CAN FOOD AFFECT ME? Carbohydrates Carbohydrates affect your blood glucose level more than any other type of food. Your dietitian will help you determine how many carbohydrates to eat at each meal and teach you how to count carbohydrates. Counting carbohydrates is important to keep your blood glucose at a healthy level, especially if you are using insulin or taking certain medicines for diabetes mellitus. Alcohol Alcohol can cause sudden decreases in blood glucose (hypoglycemia), especially if you use insulin or take certain medicines for diabetes mellitus. Hypoglycemia can be a life-threatening condition. Symptoms of hypoglycemia (sleepiness, dizziness, and disorientation) are similar to symptoms of having too much alcohol.  If your health care provider has given you approval to drink alcohol, do so in moderation and use the following guidelines:  Women should not have more than one drink per day, and men  should not have more than two drinks per day. One drink is equal to:  12 oz of beer.  5 oz of wine.  1 oz of hard liquor.  Do not drink on an empty stomach.  Keep yourself hydrated. Have water, diet soda, or unsweetened iced tea.  Regular soda, juice, and other mixers might contain a lot of carbohydrates and should be counted. WHAT FOODS ARE NOT RECOMMENDED? As you make food choices, it is important to remember that all foods are not the same. Some foods have fewer nutrients per serving than other foods, even though they might have the same number of calories or carbohydrates. It is difficult to get your body what it needs when you eat foods with fewer nutrients. Examples of foods that you should avoid that are high in calories and carbohydrates but low in nutrients include:  Trans fats (most processed foods list trans fats on the Nutrition Facts label).  Regular soda.  Juice.  Candy.  Sweets, such as cake, pie, doughnuts, and cookies.  Fried foods. WHAT FOODS CAN I EAT? Eat nutrient-rich foods, which will nourish your body and keep you healthy. The food you should eat also will depend on several factors, including:  The calories you need.  The medicines you take.  Your weight.  Your blood glucose level.  Your blood pressure level.  Your cholesterol level. You should eat a variety of foods, including:  Protein.  Lean cuts of meat.  Proteins low in saturated fats, such as fish, egg whites, and beans. Avoid processed meats.  Fruits and vegetables.  Fruits and vegetables that may help control blood glucose levels, such as apples, mangoes, and   yams.  Dairy products.  Choose fat-free or low-fat dairy products, such as milk, yogurt, and cheese.  Grains, bread, pasta, and rice.  Choose whole grain products, such as multigrain bread, whole oats, and brown rice. These foods may help control blood pressure.  Fats.  Foods containing healthful fats, such as nuts,  avocado, olive oil, canola oil, and fish. DOES EVERYONE WITH DIABETES MELLITUS HAVE THE SAME MEAL PLAN? Because every person with diabetes mellitus is different, there is not one meal plan that works for everyone. It is very important that you meet with a dietitian who will help you create a meal plan that is just right for you.   This information is not intended to replace advice given to you by your health care provider. Make sure you discuss any questions you have with your health care provider.   Document Released: 12/21/2004 Document Revised: 04/16/2014 Document Reviewed: 02/20/2013 Elsevier Interactive Patient Education 2016 Elsevier Inc.  

## 2016-01-05 NOTE — Progress Notes (Signed)
Subjective:    Patient ID: [name], male    DOB: 12-May-1955, 60 y.o.   MRN: [id number]  PT presents to the office today for chronic follow up. Pt has rheumatoid arthritis and followed by rheumatologists every 3 months.    Hypertension  This is a chronic problem. The current episode started more than 1 year ago. The problem is unchanged. The problem is controlled. Pertinent negatives include no blurred vision, headaches, malaise/fatigue, palpitations or peripheral edema. Risk factors for coronary artery disease include diabetes mellitus, dyslipidemia, family history, male gender and obesity. Past treatments include angiotensin blockers and diuretics. The current treatment provides moderate improvement. There is no history of kidney disease, CAD/MI, CVA, heart failure or a thyroid problem. There is no history of sleep apnea.  Diabetes  He presents for his follow-up diabetic visit. He has type 2 diabetes mellitus. His disease course has been improving. Pertinent negatives for hypoglycemia include no confusion, dizziness or headaches. Pertinent negatives for diabetes include no blurred vision, no foot paresthesias, no foot ulcerations and no visual change. Pertinent negatives for hypoglycemia complications include no blackouts. Symptoms are worsening. Pertinent negatives for diabetic complications include no CVA, heart disease, nephropathy or peripheral neuropathy. Risk factors for coronary artery disease include diabetes mellitus, dyslipidemia, hypertension, male sex and family history. Current diabetic treatment includes oral agent (monotherapy). He is compliant with treatment all of the time. He is following a generally unhealthy diet. His breakfast blood glucose range is generally 180-200 mg/dl. An ACE inhibitor/angiotensin II receptor blocker is being taken. Eye exam is current.  Hyperlipidemia  This is a chronic problem. The current episode started more than 1 year ago. The problem is  controlled. Recent lipid tests were reviewed and are normal. Exacerbating diseases include diabetes and obesity. He has no history of hypothyroidism. Factors aggravating his hyperlipidemia include fatty foods. Associated symptoms include leg pain. Pertinent negatives include no myalgias. Current antihyperlipidemic treatment includes statins. The current treatment provides moderate improvement of lipids. Risk factors for coronary artery disease include diabetes mellitus, dyslipidemia, family history, male sex and obesity.  Arthritis  Presents for follow-up visit. The disease course has been stable. He complains of pain. Affected locations include the left MCP, right MCP, left shoulder and right shoulder ("Head to toe"). His pain is at a severity of 7/10. Associated symptoms include pain at night and pain while resting. (Pt has rheumatoid and osteoarthritis and is followed by Ortho and rheumatologist every 3 months ) His past medical history is significant for osteoarthritis and rheumatoid arthritis. Past treatments include rest and an opioid. The treatment provided mild relief.       Review of Systems  Constitutional: Negative.  Negative for malaise/fatigue.  HENT: Negative.   Eyes: Negative for blurred vision.  Respiratory: Negative.   Cardiovascular: Negative.  Negative for palpitations.  Gastrointestinal: Negative.   Endocrine: Negative.   Genitourinary: Negative.   Musculoskeletal: Positive for arthritis. Negative for myalgias.  Neurological: Negative.  Negative for dizziness and headaches.  Hematological: Negative.   Psychiatric/Behavioral: Negative.  Negative for confusion.  All other systems reviewed and are negative.      Objective:   Physical Exam  Constitutional: He is oriented to person, place, and time. He appears well-developed and well-nourished. No distress.  HENT:  Head: Normocephalic.  Right Ear: External ear normal.  Left Ear: External ear normal.  Nose: Nose normal.    Mouth/Throat: Oropharynx is clear and moist.  Eyes: Pupils are equal, round, and reactive to light.  Right eye exhibits no discharge. Left eye exhibits no discharge.  Neck: Normal range of motion. Neck supple. No thyromegaly present.  Cardiovascular: Normal rate, regular rhythm, normal heart sounds and intact distal pulses.   No murmur heard. Pulmonary/Chest: Effort normal and breath sounds normal. No respiratory distress. He has no wheezes.  Abdominal: Soft. Bowel sounds are normal. He exhibits no distension. There is no tenderness.  Musculoskeletal: Normal range of motion. He exhibits no edema or tenderness.  Neurological: He is alert and oriented to person, place, and time. He has normal reflexes. No cranial nerve deficit.  Skin: Skin is warm and dry. No rash noted. No erythema.  Psychiatric: He has a normal mood and affect. His behavior is normal. Judgment and thought content normal.  Vitals reviewed.     BP 130/81   Pulse 70   Temp 97.2 F (36.2 C) (Oral)   Ht _0  (1.778 m)   Wt 242 lb 3.2 oz (109.9 kg)   BMI 34.75 kg/m      Assessment & Plan:  1. Essential hypertension - CMP14+EGFR  2. Type 2 diabetes mellitus with hyperglycemia, without long-term current use of insulin (HCC) - Bayer DCA Hb A1c Waived - CMP14+EGFR  3. Rheumatoid arthritis with positive rheumatoid factor, involving unspecified site ([place]) - CMP14+EGFR  4. Primary localized osteoarthritis of right knee - CMP14+EGFR  5. Hyperlipidemia - CMP14+EGFR - Lipid panel  6. Morbid obesity, unspecified obesity type ([place]) - CMP14+EGFR   Continue all meds Labs pending Health Maintenance reviewed Diet and exercise encouraged RTO 3 months  Evelina Dun, FNP

## 2016-01-06 LAB — CMP14+EGFR
ALT: 15 IU/L (ref 0–44)
AST: 20 IU/L (ref 0–40)
Albumin/Globulin Ratio: 1.6 (ref 1.2–2.2)
Albumin: 4.4 g/dL (ref 3.6–4.8)
Alkaline Phosphatase: 70 IU/L (ref 39–117)
BUN/Creatinine Ratio: 13 (ref 10–24)
BUN: 15 mg/dL (ref 8–27)
Bilirubin Total: 0.3 mg/dL (ref 0.0–1.2)
CALCIUM: 9.6 mg/dL (ref 8.6–10.2)
CO2: 25 mmol/L (ref 18–29)
CREATININE: 1.16 mg/dL (ref 0.76–1.27)
Chloride: 98 mmol/L (ref 96–106)
GFR, EST AFRICAN AMERICAN: 78 mL/min/{1.73_m2} (ref >59)
GFR, EST NON AFRICAN AMERICAN: 67 mL/min/{1.73_m2} (ref >59)
GLOBULIN, TOTAL: 2.8 g/dL (ref 1.5–4.5)
Glucose: 159 mg/dL — ABNORMAL HIGH (ref 65–99)
Potassium: 5 mmol/L (ref 3.5–5.2)
SODIUM: 138 mmol/L (ref 134–144)
TOTAL PROTEIN: 7.2 g/dL (ref 6.0–8.5)

## 2016-01-06 LAB — LIPID PANEL
CHOL/HDL RATIO: 3.3 ratio (ref 0.0–5.0)
Cholesterol, Total: 124 mg/dL (ref 100–199)
HDL: 38 mg/dL — AB (ref >39)
LDL CALC: 67 mg/dL (ref 0–99)
TRIGLYCERIDES: 96 mg/dL (ref 0–149)
VLDL Cholesterol Cal: 19 mg/dL (ref 5–40)

## 2016-01-11 ENCOUNTER — Ambulatory Visit (INDEPENDENT_AMBULATORY_CARE_PROVIDER_SITE_OTHER): Payer: BLUE CROSS/BLUE SHIELD | Admitting: Pharmacist

## 2016-01-11 ENCOUNTER — Encounter: Payer: Self-pay | Admitting: Pharmacist

## 2016-01-11 VITALS — BP 132/78 | HR 77 | Ht 70.0 in | Wt 241.0 lb

## 2016-01-11 DIAGNOSIS — E1165 Type 2 diabetes mellitus with hyperglycemia: Secondary | ICD-10-CM | POA: Diagnosis not present

## 2016-01-11 DIAGNOSIS — E6609 Other obesity due to excess calories: Secondary | ICD-10-CM

## 2016-01-11 NOTE — Patient Instructions (Signed)
Diabetes and Standards of Medical Care   Diabetes is complicated. You may find that your diabetes team includes a dietitian, nurse, diabetes educator, eye doctor, and more. To help everyone know what is going on and to help you get the care you deserve, the following schedule of care was developed to help keep you on track. Below are the tests, exams, vaccines, medicines, education, and plans you will need.  Blood Glucose Goals Prior to meals = 80 - 130 Within 2 hours of the start of a meal = less than 180  HbA1c test (goal is less than 7.0% - your last value was 8.3%) This test shows how well you have controlled your glucose over the past 2 to 3 months. It is used to see if your diabetes management plan needs to be adjusted.   It is performed at least 2 times a year if you are meeting treatment goals.  It is performed 4 times a year if therapy has changed or if you are not meeting treatment goals.  Blood pressure test  This test is performed at every routine medical visit. The goal is less than 140/90 mmHg for most people, but 130/80 mmHg in some cases. Ask your health care provider about your goal.  Dental exam  Follow up with the dentist regularly.  Eye exam  If you are diagnosed with type 1 diabetes as a child, get an exam upon reaching the age of 10 years or older and have had diabetes for 3 to 5 years. Yearly eye exams are recommended after that initial eye exam.  If you are diagnosed with type 1 diabetes as an adult, get an exam within 5 years of diagnosis and then yearly.  If you are diagnosed with type 2 diabetes, get an exam as soon as possible after the diagnosis and then yearly.  Foot care exam  Visual foot exams are performed at every routine medical visit. The exams check for cuts, injuries, or other problems with the feet.  A comprehensive foot exam should be done yearly. This includes visual inspection as well as assessing foot pulses and testing for loss of  sensation.  Check your feet nightly for cuts, injuries, or other problems with your feet. Tell your health care provider if anything is not healing.  Kidney function test (urine microalbumin)  This test is performed once a year.  Type 1 diabetes: The first test is performed 5 years after diagnosis.  Type 2 diabetes: The first test is performed at the time of diagnosis.  A serum creatinine and estimated glomerular filtration rate (eGFR) test is done once a year to assess the level of chronic kidney disease (CKD), if present.  Lipid profile (cholesterol, HDL, LDL, triglycerides)  Performed every 5 years for most people.  The goal for LDL is less than 100 mg/dL. If you are at high risk, the goal is less than 70 mg/dL.  The goal for HDL is 40 mg/dL to 50 mg/dL for men and 50 mg/dL to 60 mg/dL for women. An HDL cholesterol of 60 mg/dL or higher gives some protection against heart disease.  The goal for triglycerides is less than 150 mg/dL.  Influenza vaccine, pneumococcal vaccine, and hepatitis B vaccine  The influenza vaccine is recommended yearly.  The pneumococcal vaccine is generally given once in a lifetime. However, there are some instances when another vaccination is recommended. Check with your health care provider.  The hepatitis B vaccine is also recommended for adults with diabetes.    Diabetes self-management education  Education is recommended at diagnosis and ongoing as needed.  Treatment plan  Your treatment plan is reviewed at every medical visit.  Document Released: 01/21/2009 Document Revised: 11/26/2012 Document Reviewed: 08/26/2012 Exit[employer] Patient Information 2014 [employer].   

## 2016-01-11 NOTE — Progress Notes (Signed)
Subjective:    [name] is a 60 y.o. male who presents for an initial evaluation of Type 2 diabetes mellitus.  Current symptoms/problems include hyperglycemia and have been worsening.  [name] current diabetes regimen is metformin 1000mg  qam and 1500mg  qpm and Trulicity 1.5mg  q week however patient has never started Trulicity because he is afraid to start any new medications at this time due to starting abatacept.    His father had diabetes with complications - BKA  Known diabetic complications: none Cardiovascular risk factors: advanced age (older than 52 for men, 56 for women), diabetes mellitus, dyslipidemia, hypertension, male gender, obesity (BMI >= 30 kg/m2) and sedentary lifestyle  Eye exam current (within one year): yes Weight trend: stable Prior visit with CDE: yes - but has been several years ago Current diet: in general, an "unhealthy" diet - drinks sweet tea each morning and when eating out. Current exercise: none - due to pain Medication Compliance?  No  Current monitoring regimen: home blood tests - qd times daily Home blood sugar records: patient did not bring in records Any episodes of hypoglycemia? no  Is He on ACE inhibitor or angiotensin II receptor blocker?  Yes    losartan (Cozaar)  The following portions of the patient's history were reviewed and updated as appropriate: allergies, current medications, past family history, past medical history, past social history, past surgical history and problem list.    Objective:    There were no vitals taken for this visit.  Lab Review Glucose (mg/dL)  Date Value  01/05/2016 159 (H)  10/03/2015 155 (H)  06/27/2015 236 (H)   Glucose, Bld (mg/dL)  Date Value  03/08/2011 134 (H)  03/18/2008 123 (H)   CO2 (mmol/L)  Date Value  01/05/2016 25  10/03/2015 24  06/27/2015 21   BUN (mg/dL)  Date Value  01/05/2016 15  10/03/2015 18  06/27/2015 16   Creatinine, Ser (mg/dL)  Date Value  01/05/2016  1.16  10/03/2015 1.09  06/27/2015 1.12   A1c = 8.3% (01/05/2016)    Assessment:    Diabetes Mellitus type II, under adequate control.    Plan:    1.  Rx changes: none- patient refused to take additional med at this time 2.  Education: Reviewed 'ABCs' of diabetes management (respective goals in parentheses):  A1C (<7), blood pressure (<130/80), and cholesterol (LDL <100). 3. Discussed pathophysiology of DM; difference between type 1 and type 2 DM. 4. CHO counting diet discussed.  Reviewed CHO amount in various foods and how to read nutrition labels.  Discussed recommended serving sizes.  Spent 40 minutes discussing diet.  Patient advised to switch to unsweetened tea or 1/2 and 1/2 mixture. 5.  Recommend check BG 2  times a day 6.  Recommended increase physical activity - start with 5 to 10 minutes daily and work up as able 7. Follow up: recommended follow up in 4 weeks but patient refused any more appointments in 2017 due to limited financials and other appointments.  He is seeing PCP in early January 2018.  If A1c still elevated then will see him in January also.

## 2016-01-16 ENCOUNTER — Encounter: Payer: Self-pay | Admitting: *Deleted

## 2016-01-23 DIAGNOSIS — M0589 Other rheumatoid arthritis with rheumatoid factor of multiple sites: Secondary | ICD-10-CM | POA: Diagnosis not present

## 2016-02-20 DIAGNOSIS — M0589 Other rheumatoid arthritis with rheumatoid factor of multiple sites: Secondary | ICD-10-CM | POA: Diagnosis not present

## 2016-03-19 DIAGNOSIS — M0609 Rheumatoid arthritis without rheumatoid factor, multiple sites: Secondary | ICD-10-CM | POA: Diagnosis not present

## 2016-03-19 DIAGNOSIS — M25531 Pain in right wrist: Secondary | ICD-10-CM | POA: Diagnosis not present

## 2016-03-19 DIAGNOSIS — M19011 Primary osteoarthritis, right shoulder: Secondary | ICD-10-CM | POA: Diagnosis not present

## 2016-03-21 NOTE — Progress Notes (Signed)
Patient ID: SHERILL HARKER, male   DOB: [birth year], 60 y.o.   MRN: [id number] [name] is seen today in F/U of CRF's. 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 His weight is still up and he needs to focus on a more low carb diet. He eats out a lot and has large portions. He has seen a nutrutionist and is working on this. He denies recurrent SSCP. He has lower back problems and complains about his legs aching in the morning. He has been compliant with his meds. He quit smoking in 1998. He is sedentary and still overweight with a BMI of 37.4. 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 Pain left side of chest non exertional Can wake him from sleep sometimes related to food. Can improve with meal. Continues to have occasional episodes   ROS: Denies fever, malais, weight loss, blurry vision, decreased visual acuity, cough, sputum, SOB, hemoptysis, pleuritic pain, palpitaitons, heartburn, abdominal pain, melena, lower extremity edema, claudication, or rash.  All other systems reviewed and negative  General: Affect appropriate Obese white male  HEENT: normal Neck supple with no adenopathy JVP normal no bruits no thyromegaly Lungs clear with no wheezing and good diaphragmatic motion Heart:  S1/S2 no murmur, no rub, gallop or click PMI normal Abdomen: benighn, BS positve, no tenderness, no AAA no bruit.  No HSM or HJR Distal pulses intact with no bruits No edema Neuro non-focal Skin warm and dry No muscular weakness   Current Outpatient Prescriptions  Medication Sig Dispense Refill  . abatacept in sodium chloride 0.9 % Inject into the vein every 30 (thirty) days.  Reported on 06/27/2015    . EPINEPHrine (EPIPEN 2-PAK) 0.3 mg/0.3 mL IJ SOAJ injection USE AS DIRECTED AS NEEDED 2 Device 1  . hydrochlorothiazide (HYDRODIURIL) 12.5 MG tablet Take 1 tablet (12.5 mg total) by mouth daily. 90 tablet 3  . losartan (COZAAR) 100 MG tablet Take 1 tablet (100 mg total) by mouth daily. 90 tablet 3  . metFORMIN (GLUCOPHAGE) 1000 MG tablet Take 1000 mg with breakfast and 1500 mg with dinner 225 tablet 4  . mometasone (NASONEX) 50 MCG/ACT nasal spray Place 2 sprays into the nose daily. 17 g 12  . oxyCODONE-acetaminophen (PERCOCET) 10-325 MG tablet Take 1-2 tablets by mouth every 6 (six) hours as needed for pain. MAXIMUM TOTAL ACETAMINOPHEN DOSE IS 4000 MG PER DAY 50 tablet 0  . simvastatin (ZOCOR) 10 MG tablet Take 1 tablet (10 mg total) by mouth daily. 90 tablet 3   No current facility-administered medications for this visit.     Allergies  Shellfish allergy; Doxycycline; Invokana [canagliflozin]; and Keflex [cephalexin]  Electrocardiogram:  SR rate 62 T wave inversion Lead 3  SR rate 60 T inversions 3,F   Assessment and Plan HTN: continue ARB and diuretic  [id number] low carb diet.  Target hemoglobin A1c is 6.5 or less.  Continue current medications. Arthritis:continue monthly abatacept with rheum Chol:  Cholesterol is at goal.  Continue current dose of statin and diet Rx.  No myalgias or side effects.  F/U  LFT's in 6 months.  Lab Results  Component Value Date   LDLCALC 67 01/05/2016   Rheumatoid arthritis:   Chest Pain :  Atypical abnormal ECG f/u exercise myovue          F/u with me in a year    Jenkins Rouge

## 2016-03-23 ENCOUNTER — Ambulatory Visit (INDEPENDENT_AMBULATORY_CARE_PROVIDER_SITE_OTHER): Payer: BLUE CROSS/BLUE SHIELD | Admitting: Cardiovascular Disease

## 2016-03-23 ENCOUNTER — Encounter: Payer: Self-pay | Admitting: Cardiovascular Disease

## 2016-03-23 ENCOUNTER — Encounter (INDEPENDENT_AMBULATORY_CARE_PROVIDER_SITE_OTHER): Payer: Self-pay

## 2016-03-23 VITALS — BP 140/80 | HR 69 | Ht 70.0 in | Wt 243.8 lb

## 2016-03-23 DIAGNOSIS — R079 Chest pain, unspecified: Secondary | ICD-10-CM

## 2016-03-23 DIAGNOSIS — R9431 Abnormal electrocardiogram [ECG] [EKG]: Secondary | ICD-10-CM | POA: Diagnosis not present

## 2016-03-23 NOTE — Patient Instructions (Addendum)
Medication Instructions:  Your physician recommends that you continue on your current medications as directed. Please refer to the Current Medication list given to you today.  Labwork: NONE  Testing/Procedures: Your physician has requested that you have en exercise stress myoview. For further information please visit [email or web address]. Please follow instruction sheet, as given.  Follow-Up: Your physician wants you to follow-up in: 6 months with Dr. Nishan. You will receive a reminder letter in the mail two months in advance. If you don't receive a letter, please call our office to schedule the follow-up appointment.   If you need a refill on your cardiac medications before your next appointment, please call your pharmacy.   

## 2016-03-26 ENCOUNTER — Telehealth (HOSPITAL_COMMUNITY): Payer: Self-pay | Admitting: *Deleted

## 2016-03-26 NOTE — Telephone Encounter (Signed)
Patient given detailed instructions per Myocardial Perfusion Study Information Sheet for the test on 03/28/16 at 0730. Patient notified to arrive 15 minutes early and that it is imperative to arrive on time for appointment to keep from having the test rescheduled.  If you need to cancel or reschedule your appointment, please call the office within 24 hours of your appointment. Failure to do so may result in a cancellation of your appointment, and a $50 no show fee. Patient verbalized understanding.Kambrea Carrasco, Ranae Palms

## 2016-03-28 ENCOUNTER — Ambulatory Visit (HOSPITAL_COMMUNITY): Payer: BLUE CROSS/BLUE SHIELD | Attending: Internal Medicine

## 2016-03-28 DIAGNOSIS — R9431 Abnormal electrocardiogram [ECG] [EKG]: Secondary | ICD-10-CM | POA: Insufficient documentation

## 2016-03-28 DIAGNOSIS — R079 Chest pain, unspecified: Secondary | ICD-10-CM | POA: Diagnosis not present

## 2016-03-28 LAB — MYOCARDIAL PERFUSION IMAGING
CHL CUP MPHR: 158 {beats}/min
CHL CUP NUCLEAR SSS: 5
CHL CUP RESTING HR STRESS: 64 {beats}/min
CSEPED: 5 min
CSEPEDS: 30 s
CSEPHR: 93 %
Estimated workload: 7 METS
LHR: 0.29
LV sys vol: 53 mL
LVDIAVOL: 109 mL (ref 62–150)
Peak HR: 148 {beats}/min
RPE: 17
SDS: 3
SRS: 2
TID: 0.96

## 2016-03-28 MED ORDER — TECHNETIUM TC 99M TETROFOSMIN IV KIT
11.0000 | PACK | Freq: Once | INTRAVENOUS | Status: AC | PRN
  Administered 2016-03-28: 11 via INTRAVENOUS
  Filled 2016-03-28: qty 11

## 2016-03-28 MED ORDER — TECHNETIUM TC 99M TETROFOSMIN IV KIT
31.7000 | PACK | Freq: Once | INTRAVENOUS | Status: AC | PRN
  Administered 2016-03-28: 31.7 via INTRAVENOUS
  Filled 2016-03-28: qty 32

## 2016-04-03 ENCOUNTER — Ambulatory Visit (INDEPENDENT_AMBULATORY_CARE_PROVIDER_SITE_OTHER): Payer: BLUE CROSS/BLUE SHIELD | Admitting: Physician Assistant

## 2016-04-03 ENCOUNTER — Encounter: Payer: Self-pay | Admitting: Physician Assistant

## 2016-04-03 VITALS — BP 124/75 | HR 94 | Temp 97.3°F | Ht 70.0 in | Wt 247.0 lb

## 2016-04-03 DIAGNOSIS — J209 Acute bronchitis, unspecified: Secondary | ICD-10-CM | POA: Diagnosis not present

## 2016-04-03 MED ORDER — AZITHROMYCIN 250 MG PO TABS: ORAL_TABLET | ORAL | 0 refills | Status: DC

## 2016-04-03 NOTE — Patient Instructions (Signed)

## 2016-04-05 NOTE — Progress Notes (Signed)
BP 124/75   Pulse 94   Temp 97.3 F (36.3 C) (Oral)   Ht 5\' 10"  (1.778 m)   Wt 247 lb (112 kg)   BMI 35.44 kg/m    Subjective:    Patient ID: [name], male    DOB: [birth year], 60 y.o.   MRN: [id number]  HPI: [name] is a 60 y.o. male presenting on 04/03/2016 for Cough and Nasal Congestion  Patient with several days of progressing aupprt respiratory and bronchial symptoms. Initially there was more upper respiratory congestion. This progressed to having significant cough that is productive throughout the day and severe at night. There is occasional wheezing after coughing. They will sometimes have slight dyspnea on exertion. It is productive mucus that is yellow in color. Denies any blood.  Relevant past medical, surgical, family and social history reviewed and updated as indicated. Allergies and medications reviewed and updated.  Past Medical History:  Diagnosis Date  . Arthritis    rheumatoid arthritis  . Diabetes mellitus   . Hyperlipidemia   . Hypertension   . Primary localized osteoarthritis of right knee 05/13/2015  . Vitamin D deficiency     Past Surgical History:  Procedure Laterality Date  . CARDIAC CATHETERIZATION  2005   normal  . CARPAL TUNNEL RELEASE  2009   both hands  . KNEE ARTHROSCOPY     rt  . PARTIAL KNEE ARTHROPLASTY Right 05/13/2015   Procedure: RIGHT UNI KNEE ARTHROPLASTY;  Surgeon: Marchia Bond, MD;  Location: [facility];  Service: Orthopedics;  Laterality: Right;  . scaphoid excision Left 2012   Dr Daylene Katayama  . TONSILLECTOMY      Review of Systems  Constitutional: Positive for fatigue. Negative for appetite change.  HENT: Positive for sinus pressure and sore throat.   Eyes: Negative.  Negative for pain and visual disturbance.  Respiratory: Positive for shortness of breath and wheezing. Negative for cough and chest tightness.   Cardiovascular: Negative.  Negative for chest pain, palpitations and leg swelling.    Gastrointestinal: Negative.  Negative for abdominal pain, diarrhea, nausea and vomiting.  Endocrine: Negative.   Genitourinary: Negative.   Musculoskeletal: Positive for back pain and myalgias.  Skin: Negative.  Negative for color change and rash.  Neurological: Positive for headaches. Negative for weakness and numbness.  Psychiatric/Behavioral: Negative.     Allergies as of 04/03/2016      Reactions   Shellfish Allergy Shortness Of Breath, Swelling   Doxycycline Nausea And Vomiting   Invokana [canagliflozin] Other (See Comments)   Causes bladder infections   Keflex [cephalexin]    Pt does not tol-nervous      Medication List       Accurate as of 04/03/16 11:59 PM. Always use your most recent med list.          abatacept in sodium chloride 0.9 % Inject into the vein every 30 (thirty) days. Reported on 06/27/2015   azithromycin 250 MG tablet Commonly known as:  ZITHROMAX Z-PAK As directed   EPINEPHrine 0.3 mg/0.3 mL Soaj injection Commonly known as:  EPIPEN 2-PAK USE AS DIRECTED AS NEEDED   hydrochlorothiazide 12.5 MG tablet Commonly known as:  HYDRODIURIL Take 1 tablet (12.5 mg total) by mouth daily.   losartan 100 MG tablet Commonly known as:  COZAAR Take 1 tablet (100 mg total) by mouth daily.   metFORMIN 1000 MG tablet Commonly known as:  GLUCOPHAGE Take 1000 mg with breakfast and 1500 mg with dinner  mometasone 50 MCG/ACT nasal spray Commonly known as:  NASONEX Place 2 sprays into the nose daily.   oxyCODONE-acetaminophen 10-325 MG tablet Commonly known as:  PERCOCET Take 1-2 tablets by mouth every 6 (six) hours as needed for pain. MAXIMUM TOTAL ACETAMINOPHEN DOSE IS 4000 MG PER DAY   simvastatin 10 MG tablet Commonly known as:  ZOCOR Take 1 tablet (10 mg total) by mouth daily.          Objective:    BP 124/75   Pulse 94   Temp 97.3 F (36.3 C) (Oral)   Ht 5\' 10"  (1.778 m)   Wt 247 lb (112 kg)   BMI 35.44 kg/m   Allergies  Allergen  Reactions  . Shellfish Allergy Shortness Of Breath and Swelling  . Doxycycline Nausea And Vomiting  . Invokana [Canagliflozin] Other (See Comments)    Causes bladder infections  . Keflex [Cephalexin]     Pt does not tol-nervous    Physical Exam  Constitutional: He appears well-developed and well-nourished.  HENT:  Head: Normocephalic and atraumatic.  Right Ear: Hearing and tympanic membrane normal.  Left Ear: Hearing and tympanic membrane normal.  Nose: Mucosal edema and sinus tenderness present. No nasal deformity. Right sinus exhibits frontal sinus tenderness. Left sinus exhibits frontal sinus tenderness.  Mouth/Throat: Posterior oropharyngeal erythema present.  Eyes: Conjunctivae and EOM are normal. Pupils are equal, round, and reactive to light. Right eye exhibits no discharge. Left eye exhibits no discharge.  Neck: Normal range of motion. Neck supple.  Cardiovascular: Normal rate, regular rhythm and normal heart sounds.   Pulmonary/Chest: Effort normal. No respiratory distress. He has no decreased breath sounds. He has wheezes. He has no rhonchi. He has no rales.  Abdominal: Soft. Bowel sounds are normal.  Musculoskeletal: Normal range of motion.  Skin: Skin is warm and dry.        Assessment & Plan:   1. Acute bronchitis, unspecified organism - azithromycin (ZITHROMAX Z-PAK) 250 MG tablet; As directed  Dispense: 6 tablet; Refill: 0   Continue all other maintenance medications as listed above.  Follow up plan: No Follow-up on file.  Educational handout given for bronchitis   Terald Sleeper PA-C [place] [address]  [place], [place] [address] [phone]   04/05/2016, 12:41 PM

## 2016-04-12 ENCOUNTER — Ambulatory Visit: Payer: BLUE CROSS/BLUE SHIELD | Admitting: Family

## 2016-04-16 ENCOUNTER — Ambulatory Visit: Payer: BLUE CROSS/BLUE SHIELD | Admitting: Family

## 2016-04-17 DIAGNOSIS — M0609 Rheumatoid arthritis without rheumatoid factor, multiple sites: Secondary | ICD-10-CM | POA: Diagnosis not present

## 2016-05-14 DIAGNOSIS — Z96651 Presence of right artificial knee joint: Secondary | ICD-10-CM | POA: Diagnosis not present

## 2016-05-15 ENCOUNTER — Ambulatory Visit: Payer: BLUE CROSS/BLUE SHIELD | Admitting: Family

## 2016-05-15 DIAGNOSIS — M0609 Rheumatoid arthritis without rheumatoid factor, multiple sites: Secondary | ICD-10-CM | POA: Diagnosis not present

## 2016-05-29 ENCOUNTER — Ambulatory Visit (INDEPENDENT_AMBULATORY_CARE_PROVIDER_SITE_OTHER): Payer: BLUE CROSS/BLUE SHIELD | Admitting: Family

## 2016-05-29 ENCOUNTER — Encounter: Payer: Self-pay | Admitting: Family

## 2016-05-29 VITALS — BP 112/72 | HR 78 | Temp 97.1°F | Ht 70.0 in | Wt 244.8 lb

## 2016-05-29 DIAGNOSIS — I10 Essential (primary) hypertension: Secondary | ICD-10-CM | POA: Diagnosis not present

## 2016-05-29 DIAGNOSIS — E1165 Type 2 diabetes mellitus with hyperglycemia: Secondary | ICD-10-CM

## 2016-05-29 DIAGNOSIS — R5383 Other fatigue: Secondary | ICD-10-CM

## 2016-05-29 DIAGNOSIS — E785 Hyperlipidemia, unspecified: Secondary | ICD-10-CM | POA: Diagnosis not present

## 2016-05-29 DIAGNOSIS — M1711 Unilateral primary osteoarthritis, right knee: Secondary | ICD-10-CM | POA: Diagnosis not present

## 2016-05-29 DIAGNOSIS — M059 Rheumatoid arthritis with rheumatoid factor, unspecified: Secondary | ICD-10-CM

## 2016-05-29 LAB — BAYER DCA HB A1C WAIVED: HB A1C (BAYER DCA - WAIVED): 9.3 % — ABNORMAL HIGH (ref <7.0)

## 2016-05-29 MED ORDER — SIMVASTATIN 10 MG PO TABS: 10.0000 mg | ORAL_TABLET | Freq: Every day | ORAL | 3 refills | Status: DC

## 2016-05-29 MED ORDER — LOSARTAN POTASSIUM 100 MG PO TABS: 100.0000 mg | ORAL_TABLET | Freq: Every day | ORAL | 3 refills | Status: DC

## 2016-05-29 MED ORDER — METFORMIN HCL 1000 MG PO TABS: ORAL_TABLET | ORAL | 4 refills | Status: DC

## 2016-05-29 MED ORDER — HYDROCHLOROTHIAZIDE 12.5 MG PO TABS: 12.5000 mg | ORAL_TABLET | Freq: Every day | ORAL | 3 refills | Status: DC

## 2016-05-29 NOTE — Patient Instructions (Signed)
Diabetes Mellitus and Food It is important for you to manage your blood sugar (glucose) level. Your blood glucose level can be greatly affected by what you eat. Eating healthier foods in the appropriate amounts throughout the day at about the same time each day will help you control your blood glucose level. It can also help slow or prevent worsening of your diabetes mellitus. Healthy eating may even help you improve the level of your blood pressure and reach or maintain a healthy weight. General recommendations for healthful eating and cooking habits include:  Eating meals and snacks regularly. Avoid going long periods of time without eating to lose weight.  Eating a diet that consists mainly of plant-based foods, such as fruits, vegetables, nuts, legumes, and whole grains.  Using low-heat cooking methods, such as baking, instead of high-heat cooking methods, such as deep frying.  Work with your dietitian to make sure you understand how to use the Nutrition Facts information on food labels. How can food affect me? Carbohydrates Carbohydrates affect your blood glucose level more than any other type of food. Your dietitian will help you determine how many carbohydrates to eat at each meal and teach you how to count carbohydrates. Counting carbohydrates is important to keep your blood glucose at a healthy level, especially if you are using insulin or taking certain medicines for diabetes mellitus. Alcohol Alcohol can cause sudden decreases in blood glucose (hypoglycemia), especially if you use insulin or take certain medicines for diabetes mellitus. Hypoglycemia can be a life-threatening condition. Symptoms of hypoglycemia (sleepiness, dizziness, and disorientation) are similar to symptoms of having too much alcohol. If your health care provider has given you approval to drink alcohol, do so in moderation and use the following guidelines:  Women should not have more than one drink per day, and men  should not have more than two drinks per day. One drink is equal to: ? 12 oz of beer. ? 5 oz of wine. ? 1 oz of hard liquor.  Do not drink on an empty stomach.  Keep yourself hydrated. Have water, diet soda, or unsweetened iced tea.  Regular soda, juice, and other mixers might contain a lot of carbohydrates and should be counted.  What foods are not recommended? As you make food choices, it is important to remember that all foods are not the same. Some foods have fewer nutrients per serving than other foods, even though they might have the same number of calories or carbohydrates. It is difficult to get your body what it needs when you eat foods with fewer nutrients. Examples of foods that you should avoid that are high in calories and carbohydrates but low in nutrients include:  Trans fats (most processed foods list trans fats on the Nutrition Facts label).  Regular soda.  Juice.  Candy.  Sweets, such as cake, pie, doughnuts, and cookies.  Fried foods.  What foods can I eat? Eat nutrient-rich foods, which will nourish your body and keep you healthy. The food you should eat also will depend on several factors, including:  The calories you need.  The medicines you take.  Your weight.  Your blood glucose level.  Your blood pressure level.  Your cholesterol level.  You should eat a variety of foods, including:  Protein. ? Lean cuts of meat. ? Proteins low in saturated fats, such as fish, egg whites, and beans. Avoid processed meats.  Fruits and vegetables. ? Fruits and vegetables that may help control blood glucose levels, such as apples,   mangoes, and yams.  Dairy products. ? Choose fat-free or low-fat dairy products, such as milk, yogurt, and cheese.  Grains, bread, pasta, and rice. ? Choose whole grain products, such as multigrain bread, whole oats, and brown rice. These foods may help control blood pressure.  Fats. ? Foods containing healthful fats, such as  nuts, avocado, olive oil, canola oil, and fish.  Does everyone with diabetes mellitus have the same meal plan? Because every person with diabetes mellitus is different, there is not one meal plan that works for everyone. It is very important that you meet with a dietitian who will help you create a meal plan that is just right for you. This information is not intended to replace advice given to you by your health care provider. Make sure you discuss any questions you have with your health care provider. Document Released: 12/21/2004 Document Revised: 09/01/2015 Document Reviewed: 02/20/2013 Elsevier Interactive Patient Education  2017 [employer] Inc[employer]  

## 2016-05-29 NOTE — Progress Notes (Signed)
Subjective:    Patient ID: [name], male    DOB: 16-Apr-1955, 61 y.o.   MRN: [id number]  PT presents to the office today for chronic follow up. Pt has rheumatoid arthritis and followed by rheumatologists every 3 months.    Diabetes  He presents for his follow-up diabetic visit. He has type 2 diabetes mellitus. His disease course has been improving. Pertinent negatives for hypoglycemia include no confusion, dizziness or headaches. Pertinent negatives for diabetes include no blurred vision, no foot paresthesias, no foot ulcerations and no visual change. Pertinent negatives for hypoglycemia complications include no blackouts. Symptoms are worsening. Pertinent negatives for diabetic complications include no CVA, heart disease, nephropathy or peripheral neuropathy. Risk factors for coronary artery disease include diabetes mellitus, dyslipidemia, hypertension, male sex and family history. Current diabetic treatment includes oral agent (monotherapy). He is compliant with treatment all of the time. He is following a generally unhealthy diet. His breakfast blood glucose range is generally >200 mg/dl. An ACE inhibitor/angiotensin II receptor blocker is being taken. Eye exam is current.  Hypertension  This is a chronic problem. The current episode started more than 1 year ago. The problem is unchanged. The problem is controlled. Pertinent negatives include no blurred vision, headaches, malaise/fatigue, palpitations or peripheral edema. Risk factors for coronary artery disease include diabetes mellitus, dyslipidemia, family history, male gender and obesity. Past treatments include angiotensin blockers and diuretics. The current treatment provides moderate improvement. There is no history of kidney disease, CAD/MI, CVA or heart failure. There is no history of sleep apnea or a thyroid problem.  Hyperlipidemia  This is a chronic problem. The current episode started more than 1 year ago. The problem is controlled.  Recent lipid tests were reviewed and are normal. Exacerbating diseases include diabetes and obesity. He has no history of hypothyroidism. Factors aggravating his hyperlipidemia include fatty foods. Associated symptoms include leg pain. Pertinent negatives include no myalgias. Current antihyperlipidemic treatment includes statins. The current treatment provides moderate improvement of lipids. Risk factors for coronary artery disease include diabetes mellitus, dyslipidemia, family history, male sex and obesity.  Arthritis  Presents for follow-up visit. The disease course has been stable. He complains of pain. Affected locations include the left MCP, right MCP, left shoulder and right shoulder ("Head to toe"). His pain is at a severity of 7/10. Associated symptoms include pain at night and pain while resting. (Pt has rheumatoid and osteoarthritis and is followed by Ortho and rheumatologist every 3 months ) His past medical history is significant for osteoarthritis and rheumatoid arthritis. Past treatments include rest and an opioid. The treatment provided mild relief.       Review of Systems  Constitutional: Negative.  Negative for malaise/fatigue.  HENT: Negative.   Eyes: Negative for blurred vision.  Respiratory: Negative.   Cardiovascular: Negative.  Negative for palpitations.  Gastrointestinal: Negative.   Endocrine: Negative.   Genitourinary: Negative.   Musculoskeletal: Positive for arthritis. Negative for myalgias.  Neurological: Negative.  Negative for dizziness and headaches.  Hematological: Negative.   Psychiatric/Behavioral: Negative.  Negative for confusion.  All other systems reviewed and are negative.      Objective:   Physical Exam  Constitutional: He is oriented to person, place, and time. He appears well-developed and well-nourished. No distress.  HENT:  Head: Normocephalic.  Right Ear: External ear normal.  Left Ear: External ear normal.  Nose: Nose normal.    Mouth/Throat: Oropharynx is clear and moist.  Eyes: Pupils are equal, round, and reactive to  light. Right eye exhibits no discharge. Left eye exhibits no discharge.  Neck: Normal range of motion. Neck supple. No thyromegaly present.  Cardiovascular: Normal rate, regular rhythm, normal heart sounds and intact distal pulses.   No murmur heard. Pulmonary/Chest: Effort normal and breath sounds normal. No respiratory distress. He has no wheezes.  Abdominal: Soft. Bowel sounds are normal. He exhibits no distension. There is no tenderness.  Musculoskeletal: Normal range of motion. He exhibits no edema or tenderness.  Neurological: He is alert and oriented to person, place, and time. He has normal reflexes. No cranial nerve deficit.  Skin: Skin is warm and dry. No rash noted. No erythema.  Psychiatric: He has a normal mood and affect. His behavior is normal. Judgment and thought content normal.  Vitals reviewed.     BP 112/72   Pulse 78   Temp 97.1 F (36.2 C) (Oral)   Ht 5' 10"  (1.778 m)   Wt 244 lb 12.8 oz (111 kg)   BMI 35.13 kg/m      Assessment & Plan:  1. Essential hypertension - CMP14+EGFR - hydrochlorothiazide (HYDRODIURIL) 12.5 MG tablet; Take 1 tablet (12.5 mg total) by mouth daily.  Dispense: 90 tablet; Refill: 3 - losartan (COZAAR) 100 MG tablet; Take 1 tablet (100 mg total) by mouth daily.  Dispense: 90 tablet; Refill: 3  2. Type 2 diabetes mellitus with hyperglycemia, without long-term current use of insulin ([place]) - Ambulatory referral to Ophthalmology - CMP14+EGFR - Bayer DCA Hb A1c Waived - metFORMIN (GLUCOPHAGE) 1000 MG tablet; Take 1000 mg with breakfast and 1500 mg with dinner  Dispense: 225 tablet; Refill: 4  3. Morbid obesity ([place]) - CMP14+EGFR  4. Hyperlipidemia, unspecified hyperlipidemia type - CMP14+EGFR - Lipid panel - simvastatin (ZOCOR) 10 MG tablet; Take 1 tablet (10 mg total) by mouth daily.  Dispense: 90 tablet; Refill: 3  5. Primary localized  osteoarthritis of right knee - CMP14+EGFR  6. Rheumatoid arthritis with positive rheumatoid factor, involving unspecified site ([place]) - CMP14+EGFR  7. Fatigue, unspecified type - CMP14+EGFR - Anemia Profile B - Thyroid Panel With TSH - VITAMIN D 25 Hydroxy (Vit-D Deficiency, Fractures)   Continue all meds Labs pending Health Maintenance reviewed Diet and exercise encouraged RTO 3 months   Evelina Dun, FNP

## 2016-05-30 LAB — ANEMIA PROFILE B
BASOS: 1 %
Basophils Absolute: 0.1 10*3/uL (ref 0.0–0.2)
EOS (ABSOLUTE): 0.6 10*3/uL — AB (ref 0.0–0.4)
EOS: 8 %
FERRITIN: 9 ng/mL — AB (ref 30–400)
FOLATE: 8.5 ng/mL (ref >3.0)
HEMATOCRIT: 36 % — AB (ref 37.5–51.0)
HEMOGLOBIN: 11.3 g/dL — AB (ref 13.0–17.7)
IRON SATURATION: 10 — AB (ref 15–55)
Immature Grans (Abs): 0 10*3/uL (ref 0.0–0.1)
Immature Granulocytes: 0 %
Iron: 41 ug/dL (ref 38–169)
LYMPHS ABS: 2.5 10*3/uL (ref 0.7–3.1)
Lymphs: 32 %
MCH: 23.7 pg — ABNORMAL LOW (ref 26.6–33.0)
MCHC: 31.4 g/dL — AB (ref 31.5–35.7)
MCV: 76 fL — ABNORMAL LOW (ref 79–97)
MONOS ABS: 0.5 10*3/uL (ref 0.1–0.9)
Monocytes: 7 %
NEUTROS ABS: 4.2 10*3/uL (ref 1.4–7.0)
Neutrophils: 52 %
Platelets: 303 10*3/uL (ref 150–379)
RBC: 4.77 x10E6/uL (ref 4.14–5.80)
RDW: 15.9 % — AB (ref 12.3–15.4)
Retic Ct Pct: 0.8 % (ref 0.6–2.6)
Total Iron Binding Capacity: 427 (ref 250–450)
UIBC: 386 ug/dL — ABNORMAL HIGH (ref 111–343)
VITAMIN B 12: 355 pg/mL (ref 232–1245)
WBC: 7.9 10*3/uL (ref 3.4–10.8)

## 2016-05-30 LAB — CMP14+EGFR
ALBUMIN: 4.3 g/dL (ref 3.6–4.8)
ALK PHOS: 75 IU/L (ref 39–117)
ALT: 25 IU/L (ref 0–44)
AST: 21 IU/L (ref 0–40)
Albumin/Globulin Ratio: 1.5 (ref 1.2–2.2)
BILIRUBIN TOTAL: 0.3 mg/dL (ref 0.0–1.2)
BUN / CREAT RATIO: 15 (ref 10–24)
BUN: 17 mg/dL (ref 8–27)
CHLORIDE: 95 mmol/L — AB (ref 96–106)
CO2: 20 mmol/L (ref 18–29)
CREATININE: 1.11 mg/dL (ref 0.76–1.27)
Calcium: 9.2 mg/dL (ref 8.6–10.2)
GFR calc Af Amer: 82 (ref >59)
GFR calc non Af Amer: 71 (ref >59)
GLOBULIN, TOTAL: 2.8 (ref 1.5–4.5)
GLUCOSE: 202 mg/dL — AB (ref 65–99)
Potassium: 4.4 mmol/L (ref 3.5–5.2)
SODIUM: 135 mmol/L (ref 134–144)
Total Protein: 7.1 g/dL (ref 6.0–8.5)

## 2016-05-30 LAB — LIPID PANEL
CHOLESTEROL TOTAL: 124 mg/dL (ref 100–199)
Chol/HDL Ratio: 4.1 (ref 0.0–5.0)
HDL: 30 mg/dL — ABNORMAL LOW (ref >39)
LDL Calculated: 72 (ref 0–99)
TRIGLYCERIDES: 108 mg/dL (ref 0–149)
VLDL Cholesterol Cal: 22 (ref 5–40)

## 2016-05-30 LAB — VITAMIN D 25 HYDROXY (VIT D DEFICIENCY, FRACTURES): Vit D, 25-Hydroxy: 25.8 ng/mL — ABNORMAL LOW (ref 30.0–100.0)

## 2016-05-30 LAB — THYROID PANEL WITH TSH
Free Thyroxine Index: 1.8 (ref 1.2–4.9)
T3 Uptake Ratio: 27 % (ref 24–39)
T4, Total: 6.5 ug/dL (ref 4.5–12.0)
TSH: 1.93 u[IU]/mL (ref 0.450–4.500)

## 2016-05-31 ENCOUNTER — Other Ambulatory Visit: Payer: Self-pay | Admitting: Family

## 2016-05-31 MED ORDER — SITAGLIPTIN PHOSPHATE 100 MG PO TABS: 100.0000 mg | ORAL_TABLET | Freq: Every day | ORAL | 1 refills | Status: DC

## 2016-06-05 ENCOUNTER — Other Ambulatory Visit: Payer: Self-pay | Admitting: Cardiovascular Disease

## 2016-06-06 ENCOUNTER — Ambulatory Visit (INDEPENDENT_AMBULATORY_CARE_PROVIDER_SITE_OTHER): Payer: BLUE CROSS/BLUE SHIELD | Admitting: Pharmacist

## 2016-06-06 VITALS — BP 122/74 | HR 80 | Ht 70.0 in | Wt 251.0 lb

## 2016-06-06 DIAGNOSIS — E1165 Type 2 diabetes mellitus with hyperglycemia: Secondary | ICD-10-CM

## 2016-06-06 MED ORDER — DULAGLUTIDE 0.75 MG/0.5ML ~~LOC~~ SOAJ: 0.7500 mg | SUBCUTANEOUS | 1 refills | Status: DC

## 2016-06-06 NOTE — Progress Notes (Signed)
Patient ID: [name], male   DOB: Jan 02, 1956, 61 y.o.   MRN: [id number]   Subjective:    [name] is a 61 y.o. male who presents for reevaluation of Type 2 diabetes mellitus.  Current symptoms/problems include hyperglycemia and have been worsening. A1c increased from 8.3% 12/2015 to 9.3% 05/29/2016.   [name] current diabetes regimen is metformin 1000mg  qam and 1500mg  qpm.  Last week he was prescribed Januvia but he has not started yet because he would like to start Trulicity instead due to possible weight loss as well as better BG control   His father had diabetes with complications - BKA  Known diabetic complications: none Cardiovascular risk factors: advanced age (older than 65 for men, 40 for women), diabetes mellitus, dyslipidemia, hypertension, male gender, obesity (BMI >= 30 kg/m2) and sedentary lifestyle  Eye exam current (within one year): yes Weight trend: stable Prior visit with CDE: yes - but has been several years ago Current diet: in general, an "unhealthy" diet - drinks sweet tea each morning and when eating out. Current exercise: none - due to pain Medication Compliance?  No  Current monitoring regimen: home blood tests - qd times daily Home blood sugar records: patient did not bring in records Any episodes of hypoglycemia? no  Is He on ACE inhibitor or angiotensin II receptor blocker?  Yes    losartan (Cozaar)  The following portions of the patient's history were reviewed and updated as appropriate: allergies, current medications, past family history, past medical history, past social history, past surgical history and problem list.    Objective:    BP 122/74   Pulse 80   Ht 5\' 10"  (1.778 m)   Wt 251 lb (113.9 kg)   BMI 36.01 kg/m   Lab Review Glucose (mg/dL)  Date Value  05/29/2016 202 (H)  01/05/2016 159 (H)  10/03/2015 155 (H)   Glucose, Bld (mg/dL)  Date Value  03/08/2011 134 (H)  03/18/2008 123 (H)   CO2 (mmol/L)  Date Value   05/29/2016 20  01/05/2016 25  10/03/2015 24   BUN (mg/dL)  Date Value  05/29/2016 17  01/05/2016 15  10/03/2015 18   Creatinine, Ser (mg/dL)  Date Value  05/29/2016 1.11  01/05/2016 1.16  10/03/2015 1.09   A1c - 9.3% (05/29/2016) A1c = 8.3% (01/05/2016)    Assessment:    Diabetes Mellitus type II, under inadequate control.    Plan:    1.  Rx changes:  Start Trulicity 0.75mg  - inject 0.75 SQ once weekly  Continue metformin 1000mg  - 1000mg  qam and 1500mg  qpm 2.  Education: Reviewed 'ABCs' of diabetes management (respective goals in parentheses):  A1C (<7), blood pressure (<130/80), and cholesterol (LDL <100). 3. Reminded that eye exam is due - patient has appt with optometrist 06/2016 in [place], [place] 4. Spent 30 minutes reviewing CHO counting diet.  Reviewed CHO amount in various foods and how to read nutrition labels.   5.  Recommend check BG 2  times a day 6.  Recommended increase physical activity - start with 5 to 10 minutes daily and work up as able 7. Follow up: recommended follow up in 4 weeks

## 2016-06-06 NOTE — Patient Instructions (Signed)
Diabetes and Standards of Medical Care   Diabetes is complicated. You may find that your diabetes team includes a dietitian, nurse, diabetes educator, eye doctor, and more. To help everyone know what is going on and to help you get the care you deserve, the following schedule of care was developed to help keep you on track. Below are the tests, exams, vaccines, medicines, education, and plans you will need.  Blood Glucose Goals Prior to meals = 80 - 130 Within 2 hours of the start of a meal = less than 180  HbA1c test (goal is less than 7.0% - your last value was %) This test shows how well you have controlled your glucose over the past 2 to 3 months. It is used to see if your diabetes management plan needs to be adjusted.   It is performed at least 2 times a year if you are meeting treatment goals.  It is performed 4 times a year if therapy has changed or if you are not meeting treatment goals.  Blood pressure test  This test is performed at every routine medical visit. The goal is less than 140/90 mmHg for most people, but 130/80 mmHg in some cases. Ask your health care provider about your goal.  Dental exam  Follow up with the dentist regularly.  Eye exam  If you are diagnosed with type 1 diabetes as a child, get an exam upon reaching the age of 10 years or older and have had diabetes for 3 to 5 years. Yearly eye exams are recommended after that initial eye exam.  If you are diagnosed with type 1 diabetes as an adult, get an exam within 5 years of diagnosis and then yearly.  If you are diagnosed with type 2 diabetes, get an exam as soon as possible after the diagnosis and then yearly.  Foot care exam  Visual foot exams are performed at every routine medical visit. The exams check for cuts, injuries, or other problems with the feet.  A comprehensive foot exam should be done yearly. This includes visual inspection as well as assessing foot pulses and testing for loss of  sensation.  Check your feet nightly for cuts, injuries, or other problems with your feet. Tell your health care provider if anything is not healing.  Kidney function test (urine microalbumin)  This test is performed once a year.  Type 1 diabetes: The first test is performed 5 years after diagnosis.  Type 2 diabetes: The first test is performed at the time of diagnosis.  A serum creatinine and estimated glomerular filtration rate (eGFR) test is done once a year to assess the level of chronic kidney disease (CKD), if present.  Lipid profile (cholesterol, HDL, LDL, triglycerides)  Performed every 5 years for most people.  The goal for LDL is less than 100 mg/dL. If you are at high risk, the goal is less than 70 mg/dL.  The goal for HDL is 40 mg/dL to 50 mg/dL for men and 50 mg/dL to 60 mg/dL for women. An HDL cholesterol of 60 mg/dL or higher gives some protection against heart disease.  The goal for triglycerides is less than 150 mg/dL.  Influenza vaccine, pneumococcal vaccine, and hepatitis B vaccine  The influenza vaccine is recommended yearly.  The pneumococcal vaccine is generally given once in a lifetime. However, there are some instances when another vaccination is recommended. Check with your health care provider.  The hepatitis B vaccine is also recommended for adults with diabetes.    Diabetes self-management education  Education is recommended at diagnosis and ongoing as needed.  Treatment plan  Your treatment plan is reviewed at every medical visit.  Document Released: 01/21/2009 Document Revised: 11/26/2012 Document Reviewed: 08/26/2012 [facility] Patient Information 2014 [place].

## 2016-06-08 ENCOUNTER — Encounter: Payer: Self-pay | Admitting: Pharmacist

## 2016-06-12 DIAGNOSIS — M0609 Rheumatoid arthritis without rheumatoid factor, multiple sites: Secondary | ICD-10-CM | POA: Diagnosis not present

## 2016-06-13 ENCOUNTER — Telehealth: Payer: Self-pay | Admitting: Family

## 2016-06-13 NOTE — Telephone Encounter (Signed)
Patient states that he has felt nauseated that last 2 nights.  Not sure if it is related iron supplement or Trulicity.  Patient states that BG is better.  I advised him to hold iron supplement for next 2 - 3 days and call back to let me know how he feeling.

## 2016-06-21 DIAGNOSIS — M0609 Rheumatoid arthritis without rheumatoid factor, multiple sites: Secondary | ICD-10-CM | POA: Diagnosis not present

## 2016-06-21 DIAGNOSIS — M2550 Pain in unspecified joint: Secondary | ICD-10-CM | POA: Diagnosis not present

## 2016-06-21 DIAGNOSIS — M150 Primary generalized (osteo)arthritis: Secondary | ICD-10-CM | POA: Diagnosis not present

## 2016-06-21 DIAGNOSIS — Z79899 Other long term (current) drug therapy: Secondary | ICD-10-CM | POA: Diagnosis not present

## 2016-06-25 ENCOUNTER — Other Ambulatory Visit: Payer: Self-pay | Admitting: Cardiovascular Disease

## 2016-07-03 ENCOUNTER — Other Ambulatory Visit: Payer: Self-pay | Admitting: Pharmacist

## 2016-07-05 ENCOUNTER — Ambulatory Visit: Payer: Self-pay | Admitting: Pharmacist

## 2016-07-09 ENCOUNTER — Ambulatory Visit (INDEPENDENT_AMBULATORY_CARE_PROVIDER_SITE_OTHER): Payer: BLUE CROSS/BLUE SHIELD | Admitting: Family

## 2016-07-09 ENCOUNTER — Encounter: Payer: Self-pay | Admitting: Family

## 2016-07-09 VITALS — BP 111/71 | HR 87 | Temp 98.0°F | Ht 70.0 in | Wt 244.4 lb

## 2016-07-09 DIAGNOSIS — J209 Acute bronchitis, unspecified: Secondary | ICD-10-CM

## 2016-07-09 DIAGNOSIS — R197 Diarrhea, unspecified: Secondary | ICD-10-CM | POA: Diagnosis not present

## 2016-07-09 DIAGNOSIS — R509 Fever, unspecified: Secondary | ICD-10-CM | POA: Diagnosis not present

## 2016-07-09 DIAGNOSIS — D849 Immunodeficiency, unspecified: Secondary | ICD-10-CM | POA: Diagnosis not present

## 2016-07-09 DIAGNOSIS — J101 Influenza due to other identified influenza virus with other respiratory manifestations: Secondary | ICD-10-CM | POA: Diagnosis not present

## 2016-07-09 DIAGNOSIS — M069 Rheumatoid arthritis, unspecified: Secondary | ICD-10-CM | POA: Diagnosis not present

## 2016-07-09 DIAGNOSIS — E119 Type 2 diabetes mellitus without complications: Secondary | ICD-10-CM | POA: Diagnosis not present

## 2016-07-09 DIAGNOSIS — Z7984 Long term (current) use of oral hypoglycemic drugs: Secondary | ICD-10-CM | POA: Diagnosis not present

## 2016-07-09 DIAGNOSIS — E785 Hyperlipidemia, unspecified: Secondary | ICD-10-CM | POA: Diagnosis not present

## 2016-07-09 DIAGNOSIS — G8929 Other chronic pain: Secondary | ICD-10-CM | POA: Diagnosis not present

## 2016-07-09 DIAGNOSIS — I10 Essential (primary) hypertension: Secondary | ICD-10-CM | POA: Diagnosis not present

## 2016-07-09 MED ORDER — BENZONATATE 200 MG PO CAPS: 200.0000 mg | ORAL_CAPSULE | Freq: Three times a day (TID) | ORAL | 1 refills | Status: DC | PRN

## 2016-07-09 MED ORDER — AZITHROMYCIN 250 MG PO TABS: ORAL_TABLET | ORAL | 0 refills | Status: DC

## 2016-07-09 NOTE — Patient Instructions (Signed)

## 2016-07-09 NOTE — Progress Notes (Signed)
   Subjective:    Patient ID: [name], male    DOB: [birth year], 61 y.o.   MRN: [id number]  Cough  This is a new problem. The current episode started in the past 7 days. The problem has been gradually improving. The problem occurs every few minutes. The cough is non-productive. Associated symptoms include chills, a fever, myalgias, rhinorrhea, shortness of breath and wheezing. Pertinent negatives include no ear pain, headaches, nasal congestion or sore throat. The symptoms are aggravated by lying down. He has tried rest and OTC cough suppressant for the symptoms. The treatment provided mild relief.      Review of Systems  Constitutional: Positive for chills and fever.  HENT: Positive for rhinorrhea. Negative for ear pain and sore throat.   Respiratory: Positive for cough, shortness of breath and wheezing.   Musculoskeletal: Positive for myalgias.  Neurological: Negative for headaches.  All other systems reviewed and are negative.      Objective:   Physical Exam  Constitutional: He is oriented to person, place, and time. He appears well-developed and well-nourished. No distress.  HENT:  Head: Normocephalic.  Right Ear: External ear normal.  Left Ear: External ear normal.  Mouth/Throat: Posterior oropharyngeal erythema present.  Eyes: Pupils are equal, round, and reactive to light. Right eye exhibits no discharge. Left eye exhibits no discharge.  Neck: Normal range of motion. Neck supple. No thyromegaly present.  Cardiovascular: Normal rate, regular rhythm, normal heart sounds and intact distal pulses.   No murmur heard. Pulmonary/Chest: Effort normal and breath sounds normal. No respiratory distress. He has no wheezes.  Nonproductive cough   Abdominal: Soft. Bowel sounds are normal. He exhibits no distension. There is no tenderness.  Musculoskeletal: Normal range of motion. He exhibits no edema or tenderness.  Neurological: He is alert and oriented to person, place, and time.   Skin: Skin is warm and dry. No rash noted. No erythema.  Psychiatric: He has a normal mood and affect. His behavior is normal. Judgment and thought content normal.  Vitals reviewed.     BP 111/71   Pulse 87   Temp 98 F (36.7 C) (Oral)   Ht 5\' 10"  (1.778 m)   Wt 244 lb 6.4 oz (110.9 kg)   BMI 35.07 kg/m      Assessment & Plan:  1. Acute bronchitis, unspecified organism - Take meds as prescribed - Use a cool mist humidifier  -Use saline nose sprays frequently -Saline irrigations of the nose can be very helpful if done frequently.  * 4X daily for 1 week*  * Use of a nettie pot can be helpful with this. Follow directions with this* -Force fluids -For any cough or congestion  Use plain Mucinex- regular strength or max strength is fine   * Children- consult with Pharmacist for dosing -For fever or aces or pains- take tylenol or ibuprofen appropriate for age and weight.  * for fevers greater than 101 orally you may alternate ibuprofen and tylenol every  3 hours. -Throat lozenges if help -New toothbrush in 3 days - azithromycin (ZITHROMAX) 250 MG tablet; Take 500 mg once, then 250 mg for four days  Dispense: 6 tablet; Refill: 0 - benzonatate (TESSALON) 200 MG capsule; Take 1 capsule (200 mg total) by mouth 3 (three) times daily as needed.  Dispense: 30 capsule; Refill: Riva, FNP

## 2016-07-10 DIAGNOSIS — J101 Influenza due to other identified influenza virus with other respiratory manifestations: Secondary | ICD-10-CM | POA: Diagnosis not present

## 2016-07-10 DIAGNOSIS — J209 Acute bronchitis, unspecified: Secondary | ICD-10-CM | POA: Diagnosis not present

## 2016-07-10 DIAGNOSIS — R509 Fever, unspecified: Secondary | ICD-10-CM | POA: Diagnosis not present

## 2016-07-10 DIAGNOSIS — D849 Immunodeficiency, unspecified: Secondary | ICD-10-CM | POA: Diagnosis not present

## 2016-07-11 ENCOUNTER — Ambulatory Visit (INDEPENDENT_AMBULATORY_CARE_PROVIDER_SITE_OTHER): Payer: BLUE CROSS/BLUE SHIELD | Admitting: Pharmacist

## 2016-07-11 ENCOUNTER — Encounter: Payer: Self-pay | Admitting: Pharmacist

## 2016-07-11 VITALS — BP 132/78 | HR 74 | Ht 70.0 in | Wt 242.5 lb

## 2016-07-11 DIAGNOSIS — R110 Nausea: Secondary | ICD-10-CM

## 2016-07-11 DIAGNOSIS — E1165 Type 2 diabetes mellitus with hyperglycemia: Secondary | ICD-10-CM | POA: Diagnosis not present

## 2016-07-11 NOTE — Progress Notes (Signed)
Patient ID: [name], male   DOB: 07-Aug-1955, 61 y.o.   MRN: [id number]   Subjective:    [name] is a 61 y.o. male who presents for reevaluation of Type 2 diabetes mellitus.   At our last visit he started Trilicity 0.75mg  SQ weekly. He is also currently taking metformin 1000mg  qam and 1500mg  qpm.  Shortly after starting Trulicity he had nausea that we thought was from iron supplement.  Nausea seemed to improved a little with decreasing iron supplement dose but he still was having decreased appetite and nausea.   [name] was seen here 07/09/16 for cough and malaise.  He was started on azithromycin and benzonatate.  That evening he continued to feel worse so he went to ER at [facility].  Per patient he was told he was dehydrated and had flu B.  He started Tamiflu and was given a cough syrup to take as needed.  I have requested records to review but not available yet.  Current monitoring regimen: home blood tests - 1-2 times daily Home blood sugar records: patient did not bring in records but per his report BG has improved with Trulicity with range of 120's to 156 Any episodes of hypoglycemia? no  Known diabetic complications: none Cardiovascular risk factors: advanced age (older than 67 for men, 53 for women), diabetes mellitus, dyslipidemia, hypertension, male gender, obesity (BMI >= 30 kg/m2) and sedentary lifestyle  Weight trend: decreased by about 9lbs since our last visit 1 month ago Prior visit with CDE: yes  Current diet: in general, an "unhealthy" diet - appetite has decreased.  Drinking more juices, Gatorade to prevent dehydration.  Still drinks sweet tea each morning and when eating out. Current exercise: none Medication Compliance?  Yes   Is He on ACE inhibitor or angiotensin II receptor blocker?  Yes  losartan (Cozaar)  The following portions of the patient's history were reviewed and updated as appropriate: allergies, current medications and  problem list.    Objective:    BP 132/78 (BP Location: Left Arm, Patient Position: Sitting, Cuff Size: Normal)   Pulse 74   Ht 5\' 10"  (1.778 m)   Wt 242 lb 8 oz (110 kg)   BMI 34.80 kg/m   Lab Review Glucose (mg/dL)  Date Value  05/29/2016 202 (H)  01/05/2016 159 (H)  10/03/2015 155 (H)   Glucose, Bld (mg/dL)  Date Value  03/08/2011 134 (H)  03/18/2008 123 (H)   CO2 (mmol/L)  Date Value  05/29/2016 20  01/05/2016 25  10/03/2015 24   BUN (mg/dL)  Date Value  05/29/2016 17  01/05/2016 15  10/03/2015 18   Creatinine, Ser (mg/dL)  Date Value  05/29/2016 1.11  01/05/2016 1.16  10/03/2015 1.09   A1c - 9.3% (05/29/2016) A1c = 8.3% (01/05/2016)    Assessment:    Diabetes Mellitus type II - with improving control but likely side effect to Trulicity HTN - controlled Nausea - likely related to Trulicity   Plan:    1.  Rx changes:  Hold next Trulicity dose which is due 07/16/16.    Alternative to consider in future - Victozia or Ozempic, SGLT2 agent or DDP4  Patient is to call office in 1 week to report BG readings and follow up nausea since stopping Trulicity.   Continue metformin 1000mg  - 1000mg  qam and 1500mg  qpm 2.   Orders Placed This Encounter  Procedures  . Lipase  . Amylase   3.  Recommend check  BG 2  times a day 4.  Recommended increase physical activity - start with 5 to 10 minutes daily and work up as able 5. Follow up: recommended follow up in 4 weeks

## 2016-07-11 NOTE — Patient Instructions (Signed)
Hold Trulicity dose that is due 07/16/16.   Call me next week to let me know how you are feeling and to review blood glucose readings.

## 2016-07-12 LAB — LIPASE: LIPASE: 59 U/L (ref 13–78)

## 2016-07-12 LAB — AMYLASE: Amylase: 59 U/L (ref 31–124)

## 2016-07-13 ENCOUNTER — Other Ambulatory Visit: Payer: Self-pay | Admitting: Family

## 2016-07-18 DIAGNOSIS — D1801 Hemangioma of skin and subcutaneous tissue: Secondary | ICD-10-CM | POA: Diagnosis not present

## 2016-07-18 DIAGNOSIS — D2270 Melanocytic nevi of unspecified lower limb, including hip: Secondary | ICD-10-CM | POA: Diagnosis not present

## 2016-07-18 DIAGNOSIS — L821 Other seborrheic keratosis: Secondary | ICD-10-CM | POA: Diagnosis not present

## 2016-07-18 DIAGNOSIS — L219 Seborrheic dermatitis, unspecified: Secondary | ICD-10-CM | POA: Diagnosis not present

## 2016-07-20 DIAGNOSIS — Z79899 Other long term (current) drug therapy: Secondary | ICD-10-CM | POA: Diagnosis not present

## 2016-07-20 DIAGNOSIS — M0609 Rheumatoid arthritis without rheumatoid factor, multiple sites: Secondary | ICD-10-CM | POA: Diagnosis not present

## 2016-07-23 DIAGNOSIS — E113293 Type 2 diabetes mellitus with mild nonproliferative diabetic retinopathy without macular edema, bilateral: Secondary | ICD-10-CM | POA: Diagnosis not present

## 2016-07-23 LAB — HM DIABETES EYE EXAM

## 2016-07-24 DIAGNOSIS — J111 Influenza due to unidentified influenza virus with other respiratory manifestations: Secondary | ICD-10-CM | POA: Diagnosis not present

## 2016-07-24 DIAGNOSIS — E860 Dehydration: Secondary | ICD-10-CM | POA: Diagnosis not present

## 2016-07-24 DIAGNOSIS — D849 Immunodeficiency, unspecified: Secondary | ICD-10-CM | POA: Diagnosis not present

## 2016-07-24 DIAGNOSIS — Z09 Encounter for follow-up examination after completed treatment for conditions other than malignant neoplasm: Secondary | ICD-10-CM | POA: Diagnosis not present

## 2016-08-20 DIAGNOSIS — M25511 Pain in right shoulder: Secondary | ICD-10-CM | POA: Diagnosis not present

## 2016-08-20 DIAGNOSIS — M0609 Rheumatoid arthritis without rheumatoid factor, multiple sites: Secondary | ICD-10-CM | POA: Diagnosis not present

## 2016-08-30 ENCOUNTER — Encounter: Payer: Self-pay | Admitting: Family

## 2016-08-30 ENCOUNTER — Ambulatory Visit (INDEPENDENT_AMBULATORY_CARE_PROVIDER_SITE_OTHER): Payer: BLUE CROSS/BLUE SHIELD | Admitting: Family

## 2016-08-30 VITALS — BP 126/87 | HR 68 | Temp 97.0°F | Ht 70.0 in | Wt 239.0 lb

## 2016-08-30 DIAGNOSIS — E785 Hyperlipidemia, unspecified: Secondary | ICD-10-CM

## 2016-08-30 DIAGNOSIS — M059 Rheumatoid arthritis with rheumatoid factor, unspecified: Secondary | ICD-10-CM | POA: Diagnosis not present

## 2016-08-30 DIAGNOSIS — E1165 Type 2 diabetes mellitus with hyperglycemia: Secondary | ICD-10-CM

## 2016-08-30 DIAGNOSIS — I10 Essential (primary) hypertension: Secondary | ICD-10-CM

## 2016-08-30 DIAGNOSIS — Z1211 Encounter for screening for malignant neoplasm of colon: Secondary | ICD-10-CM | POA: Diagnosis not present

## 2016-08-30 LAB — BAYER DCA HB A1C WAIVED: HB A1C: 7.7 % — AB (ref <7.0)

## 2016-08-30 NOTE — Progress Notes (Signed)
Subjective:    Patient ID: [name], male    DOB: 26-Aug-1955, 61 y.o.   MRN: [id number]  PT presents to the office today for chronic follow up. Pt has rheumatoid arthritis and followed by rheumatologists every 3 months.    Diabetes  He presents for his follow-up diabetic visit. He has type 2 diabetes mellitus. There are no hypoglycemic associated symptoms. Pertinent negatives for diabetes include no blurred vision, no foot paresthesias and no visual change. Symptoms are stable. Risk factors for coronary artery disease include diabetes mellitus, dyslipidemia, hypertension, male sex, obesity, sedentary lifestyle and family history. He is compliant with treatment all of the time. His breakfast blood glucose range is generally 130-140 mg/dl.  Hypertension  This is a chronic problem. The current episode started more than 1 year ago. The problem has been resolved since onset. Pertinent negatives include no blurred vision, malaise/fatigue, palpitations, peripheral edema or shortness of breath. Risk factors for coronary artery disease include dyslipidemia, diabetes mellitus, family history, obesity and male gender. The current treatment provides moderate improvement.  Arthritis  Presents for follow-up visit. He complains of stiffness and joint warmth. Affected locations include the right MCP, left MCP and right wrist (back). His pain is at a severity of 4/10 (4-8).  Hyperlipidemia  This is a chronic problem. The current episode started more than 1 year ago. The problem is controlled. Recent lipid tests were reviewed and are normal. Exacerbating diseases include obesity. Pertinent negatives include no shortness of breath. Current antihyperlipidemic treatment includes statins. The current treatment provides moderate improvement of lipids.      Review of Systems  Constitutional: Negative for malaise/fatigue.  Eyes: Negative for blurred vision.  Respiratory: Negative for shortness of breath.     Cardiovascular: Negative for palpitations.  Musculoskeletal: Positive for arthritis and stiffness.  All other systems reviewed and are negative.      Objective:   Physical Exam  Constitutional: He is oriented to person, place, and time. He appears well-developed and well-nourished. No distress.  HENT:  Head: Normocephalic.  Right Ear: External ear normal.  Left Ear: External ear normal.  Nose: Nose normal.  Mouth/Throat: Oropharynx is clear and moist.  Eyes: Pupils are equal, round, and reactive to light. Right eye exhibits no discharge. Left eye exhibits no discharge.  Neck: Normal range of motion. Neck supple. No thyromegaly present.  Cardiovascular: Normal rate, regular rhythm, normal heart sounds and intact distal pulses.   No murmur heard. Pulmonary/Chest: Effort normal and breath sounds normal. No respiratory distress. He has no wheezes.  Abdominal: Soft. Bowel sounds are normal. He exhibits no distension. There is no tenderness.  Musculoskeletal: Normal range of motion. He exhibits no edema or tenderness.  Neurological: He is alert and oriented to person, place, and time.  Skin: Skin is warm and dry. No rash noted. No erythema.  Psychiatric: He has a normal mood and affect. His behavior is normal. Judgment and thought content normal.  Vitals reviewed.     BP 126/87   Pulse 68   Temp 97 F (36.1 C) (Oral)   Ht 5' 10"  (1.778 m)   Wt 239 lb (108.4 kg)   BMI 34.29 kg/m      Assessment & Plan:  1. Essential hypertension - CMP14+EGFR  2. Type 2 diabetes mellitus with hyperglycemia, without long-term current use of insulin (HCC) - CMP14+EGFR - Bayer DCA Hb A1c Waived  3. Rheumatoid arthritis with positive rheumatoid factor, involving unspecified site ([place]) - CMP14+EGFR  4.  Hyperlipidemia, unspecified hyperlipidemia type - CMP14+EGFR - Lipid panel  5. Morbid obesity ([place]) - CMP14+EGFR  6. Colon cancer screening - CMP14+EGFR - Fecal occult blood,  imunochemical; Future   Continue all meds Labs pending Health Maintenance reviewed Diet and exercise encouraged RTO 3 months   Evelina Dun, FNP

## 2016-08-30 NOTE — Patient Instructions (Signed)
Diabetes Mellitus and Food It is important for you to manage your blood sugar (glucose) level. Your blood glucose level can be greatly affected by what you eat. Eating healthier foods in the appropriate amounts throughout the day at about the same time each day will help you control your blood glucose level. It can also help slow or prevent worsening of your diabetes mellitus. Healthy eating may even help you improve the level of your blood pressure and reach or maintain a healthy weight. General recommendations for healthful eating and cooking habits include:  Eating meals and snacks regularly. Avoid going long periods of time without eating to lose weight.  Eating a diet that consists mainly of plant-based foods, such as fruits, vegetables, nuts, legumes, and whole grains.  Using low-heat cooking methods, such as baking, instead of high-heat cooking methods, such as deep frying.  Work with your dietitian to make sure you understand how to use the Nutrition Facts information on food labels. How can food affect me? Carbohydrates Carbohydrates affect your blood glucose level more than any other type of food. Your dietitian will help you determine how many carbohydrates to eat at each meal and teach you how to count carbohydrates. Counting carbohydrates is important to keep your blood glucose at a healthy level, especially if you are using insulin or taking certain medicines for diabetes mellitus. Alcohol Alcohol can cause sudden decreases in blood glucose (hypoglycemia), especially if you use insulin or take certain medicines for diabetes mellitus. Hypoglycemia can be a life-threatening condition. Symptoms of hypoglycemia (sleepiness, dizziness, and disorientation) are similar to symptoms of having too much alcohol. If your health care provider has given you approval to drink alcohol, do so in moderation and use the following guidelines:  Women should not have more than one drink per day, and men  should not have more than two drinks per day. One drink is equal to: ? 12 oz of beer. ? 5 oz of wine. ? 1 oz of hard liquor.  Do not drink on an empty stomach.  Keep yourself hydrated. Have water, diet soda, or unsweetened iced tea.  Regular soda, juice, and other mixers might contain a lot of carbohydrates and should be counted.  What foods are not recommended? As you make food choices, it is important to remember that all foods are not the same. Some foods have fewer nutrients per serving than other foods, even though they might have the same number of calories or carbohydrates. It is difficult to get your body what it needs when you eat foods with fewer nutrients. Examples of foods that you should avoid that are high in calories and carbohydrates but low in nutrients include:  Trans fats (most processed foods list trans fats on the Nutrition Facts label).  Regular soda.  Juice.  Candy.  Sweets, such as cake, pie, doughnuts, and cookies.  Fried foods.  What foods can I eat? Eat nutrient-rich foods, which will nourish your body and keep you healthy. The food you should eat also will depend on several factors, including:  The calories you need.  The medicines you take.  Your weight.  Your blood glucose level.  Your blood pressure level.  Your cholesterol level.  You should eat a variety of foods, including:  Protein. ? Lean cuts of meat. ? Proteins low in saturated fats, such as fish, egg whites, and beans. Avoid processed meats.  Fruits and vegetables. ? Fruits and vegetables that may help control blood glucose levels, such as apples,   mangoes, and yams.  Dairy products. ? Choose fat-free or low-fat dairy products, such as milk, yogurt, and cheese.  Grains, bread, pasta, and rice. ? Choose whole grain products, such as multigrain bread, whole oats, and brown rice. These foods may help control blood pressure.  Fats. ? Foods containing healthful fats, such as  nuts, avocado, olive oil, canola oil, and fish.  Does everyone with diabetes mellitus have the same meal plan? Because every person with diabetes mellitus is different, there is not one meal plan that works for everyone. It is very important that you meet with a dietitian who will help you create a meal plan that is just right for you. This information is not intended to replace advice given to you by your health care provider. Make sure you discuss any questions you have with your health care provider. Document Released: 12/21/2004 Document Revised: 09/01/2015 Document Reviewed: 02/20/2013 Elsevier Interactive Patient Education  2017 [employer] Inc[employer]  

## 2016-08-31 LAB — CMP14+EGFR
A/G RATIO: 1.6 (ref 1.2–2.2)
ALT: 20 IU/L (ref 0–44)
AST: 20 IU/L (ref 0–40)
Albumin: 4.4 g/dL (ref 3.6–4.8)
Alkaline Phosphatase: 73 IU/L (ref 39–117)
BUN/Creatinine Ratio: 14 (ref 10–24)
BUN: 16 mg/dL (ref 8–27)
Bilirubin Total: 0.3 mg/dL (ref 0.0–1.2)
CALCIUM: 9.4 mg/dL (ref 8.6–10.2)
CHLORIDE: 100 mmol/L (ref 96–106)
CO2: 22 mmol/L (ref 18–29)
Creatinine, Ser: 1.16 mg/dL (ref 0.76–1.27)
GFR calc non Af Amer: 67 mL/min/{1.73_m2} (ref >59)
GFR, EST AFRICAN AMERICAN: 77 mL/min/{1.73_m2} (ref >59)
GLUCOSE: 136 mg/dL — AB (ref 65–99)
Globulin, Total: 2.8 g/dL (ref 1.5–4.5)
POTASSIUM: 4.4 mmol/L (ref 3.5–5.2)
Sodium: 139 mmol/L (ref 134–144)
TOTAL PROTEIN: 7.2 g/dL (ref 6.0–8.5)

## 2016-08-31 LAB — LIPID PANEL
CHOL/HDL RATIO: 2.5 ratio (ref 0.0–5.0)
Cholesterol, Total: 106 mg/dL (ref 100–199)
HDL: 43 mg/dL (ref >39)
LDL Calculated: 48 mg/dL (ref 0–99)
TRIGLYCERIDES: 74 mg/dL (ref 0–149)
VLDL CHOLESTEROL CAL: 15 mg/dL (ref 5–40)

## 2016-09-17 DIAGNOSIS — M0589 Other rheumatoid arthritis with rheumatoid factor of multiple sites: Secondary | ICD-10-CM | POA: Diagnosis not present

## 2016-10-02 DIAGNOSIS — B351 Tinea unguium: Secondary | ICD-10-CM | POA: Diagnosis not present

## 2016-10-02 DIAGNOSIS — M79674 Pain in right toe(s): Secondary | ICD-10-CM | POA: Diagnosis not present

## 2016-10-16 DIAGNOSIS — M0609 Rheumatoid arthritis without rheumatoid factor, multiple sites: Secondary | ICD-10-CM | POA: Diagnosis not present

## 2016-11-01 ENCOUNTER — Encounter: Payer: Self-pay | Admitting: Family Medicine

## 2016-11-01 ENCOUNTER — Ambulatory Visit (INDEPENDENT_AMBULATORY_CARE_PROVIDER_SITE_OTHER): Payer: BLUE CROSS/BLUE SHIELD | Admitting: Family Medicine

## 2016-11-01 VITALS — BP 139/89 | HR 77 | Temp 98.1°F | Ht 70.0 in | Wt 247.0 lb

## 2016-11-01 DIAGNOSIS — A6002 Herpesviral infection of other male genital organs: Secondary | ICD-10-CM

## 2016-11-01 DIAGNOSIS — H938X1 Other specified disorders of right ear: Secondary | ICD-10-CM

## 2016-11-01 MED ORDER — VALACYCLOVIR HCL 1 G PO TABS: 1000.0000 mg | ORAL_TABLET | Freq: Every day | ORAL | 11 refills | Status: DC

## 2016-11-01 MED ORDER — VALACYCLOVIR HCL 1 G PO TABS: 1000.0000 mg | ORAL_TABLET | Freq: Two times a day (BID) | ORAL | 0 refills | Status: DC

## 2016-11-01 NOTE — Progress Notes (Signed)
BP 139/89   Pulse 77   Temp 98.1 F (36.7 C) (Oral)   Ht 5\' 10"  (1.778 m)   Wt 247 lb (112 kg)   BMI 35.44 kg/m    Subjective:    Patient ID: [name], male    DOB: 18-Apr-1955, 61 y.o.   MRN: [id number]  HPI: [name] is a 61 y.o. male presenting on 11/01/2016 for Discuss medication and Right ear stopped up (feels like there is water inside)   HPI Genital herpes Patient is coming in because he has been having some flareups of genital herpes since the first time that we saw, they've all been smaller and not a significant and last 4-5 days but have been happening about every other month. He wants to go on daily suppression for it. He does have a few small lesions currently that are nonpainful and nonpruritic. He denies any drainage or fevers or chills.  Ear pressure and congestion Patient comes in with ear pressure and congestion that's been going on for a week or 2. He feels like there is water inside. He denies any fevers or chills or nasal congestion.  Relevant past medical, surgical, family and social history reviewed and updated as indicated. Interim medical history since our last visit reviewed. Allergies and medications reviewed and updated.  Review of Systems  Constitutional: Negative for chills and fever.  HENT: Positive for congestion and ear pain. Negative for ear discharge.   Respiratory: Negative for shortness of breath and wheezing.   Cardiovascular: Negative for chest pain and leg swelling.  Genitourinary: Positive for genital sores.  Musculoskeletal: Negative for back pain and gait problem.  Skin: Negative for rash.  All other systems reviewed and are negative.   Per HPI unless specifically indicated above     Objective:    BP 139/89   Pulse 77   Temp 98.1 F (36.7 C) (Oral)   Ht 5\' 10"  (1.778 m)   Wt 247 lb (112 kg)   BMI 35.44 kg/m   Wt Readings from Last 3 Encounters:  11/01/16 247 lb (112 kg)  08/30/16 239 lb (108.4 kg)  07/11/16  242 lb 8 oz (110 kg)    Physical Exam  Constitutional: He is oriented to person, place, and time. He appears well-developed and well-nourished. No distress.  HENT:  Right Ear: Hearing, tympanic membrane, external ear and ear canal normal.  Left Ear: Hearing, tympanic membrane, external ear and ear canal normal.  Nose: Nose normal.  Mouth/Throat: Oropharynx is clear and moist. No oropharyngeal exudate.  Eyes: Conjunctivae are normal. No scleral icterus.  Cardiovascular: Normal rate, regular rhythm, normal heart sounds and intact distal pulses.   No murmur heard. Pulmonary/Chest: Effort normal and breath sounds normal. No respiratory distress. He has no wheezes. He has no rales.  Musculoskeletal: Normal range of motion. He exhibits no edema.  Neurological: He is alert and oriented to person, place, and time. Coordination normal.  Skin: Skin is warm and dry. No rash noted. He is not diaphoretic.  Psychiatric: He has a normal mood and affect. His behavior is normal.  Nursing note and vitals reviewed.     Assessment & Plan:   Problem List Items Addressed This Visit    None    Visit Diagnoses    Herpes genitalis in men    -  Primary   Relevant Medications   valACYclovir (VALTREX) 1000 MG tablet   valACYclovir (VALTREX) 1000 MG tablet   Ear congestion, right  Recommended Flonase       Follow up plan: Return if symptoms worsen or fail to improve.  Counseling provided for all of the vaccine components No orders of the defined types were placed in this encounter.   Caryl Pina, MD [facility] 11/01/2016, 8:25 AM

## 2016-11-06 ENCOUNTER — Ambulatory Visit (INDEPENDENT_AMBULATORY_CARE_PROVIDER_SITE_OTHER): Payer: BLUE CROSS/BLUE SHIELD | Admitting: Pharmacist

## 2016-11-06 ENCOUNTER — Encounter: Payer: Self-pay | Admitting: Pharmacist

## 2016-11-06 VITALS — BP 132/80 | HR 84 | Ht 70.0 in | Wt 247.0 lb

## 2016-11-06 DIAGNOSIS — Z6835 Body mass index (BMI) 35.0-35.9, adult: Secondary | ICD-10-CM | POA: Diagnosis not present

## 2016-11-06 DIAGNOSIS — E1165 Type 2 diabetes mellitus with hyperglycemia: Secondary | ICD-10-CM | POA: Diagnosis not present

## 2016-11-06 MED ORDER — LINAGLIPTIN-METFORMIN HCL ER 2.5-1000 MG PO TB24: 1.0000 | ORAL_TABLET | Freq: Two times a day (BID) | ORAL | 1 refills | Status: DC

## 2016-11-06 NOTE — Progress Notes (Addendum)
Patient ID: [name], male   DOB: 01/27/1956, 61 y.o.   MRN: [id number]   Subjective:    [name] is a 61 y.o. male referred by his PCP for diabetes management and medication management.  Mr. Barrales started Trulicity 0.75mg  Qweek about 4 months ago.  BG improved but he was experiencing some nausea that at our last visit we could not determine if it was related to influenza / dehydration or Trulicity.  He stopped Trulicity for 2 weeks and nausea improved greatly.  He did not restart.   He is  currently taking metformin 1000mg  qam and 1500mg  qpm for diabetes treatment.   Current monitoring regimen: Checks BG about twice per week Home blood sugar records: pt did not bring in glucometer.  Self reports BG in am is around 180's Any episodes of hypoglycemia? no  Known diabetic complications: none Cardiovascular risk factors: advanced age (older than 61 for men, 64 for women), diabetes mellitus, dyslipidemia, hypertension, male gender, obesity (BMI >= 30 kg/m2) and sedentary lifestyle  Weight trend: increased by 5 lbs since 07/2016 Prior visit with CDE: yes Current diet: diabetic - trying to follow  Low CHO diet. Has decreased intake of sugar containing beverages. Current exercise: none Medication Compliance?  Yes   Is He on ACE inhibitor or angiotensin II receptor blocker?  Yes  losartan (Cozaar)  The following portions of the patient's history were reviewed and updated as appropriate: allergies, current medications, past family history, past medical history, past social history, past surgical history and problem list.    Objective:    BP 132/80   Pulse 84   Ht 5\' 10"  (1.778 m)   Wt 247 lb (112 kg)   BMI 35.44 kg/m    Last A1c was 7.7% (08/30/2016) A1c - 9.3% (05/29/2016) A1c = 8.3% (01/05/2016)    BMP Latest Ref Rng & Units 08/30/2016 05/29/2016 01/05/2016  Glucose 65 - 99 mg/dL 136(H) 202(H) 159(H)  BUN 8 - 27 mg/dL 16 17 15   Creatinine 0.76 - 1.27 mg/dL 1.16 1.11  1.16  BUN/Creat Ratio 10 - 24 14 15 13   Sodium 134 - 144 mmol/L 139 135 138  Potassium 3.5 - 5.2 mmol/L 4.4 4.4 5.0  Chloride 96 - 106 mmol/L 100 95(L) 98  CO2 18 - 29 mmol/L 22 20 25   Calcium 8.6 - 10.2 mg/dL 9.4 9.2 9.6      Assessment:    Diabetes Mellitus type II - HBG not at goal Blood pressure - controlled Obesity - weight increased slightly   Plan:    1.  Rx changes:  Pt given #2 weeks of Tradjenta 5mg  take 1 tablet daily.  If tolerated then will start combo with metformin Jentadueto XR 2.5/1000mg  take 2 tablets daily.  Patient given discount card (if by chance is not covered by insurance then another option would be Janumet XR 50/1000mg  2 per day) 2.  Recommend increase checking BG to at least daily. I did discuss cost and advantages of Libre person CGM but patient not sure about cost as it might not be covered by insurance 3.  Recommended increase physical activity as able. 4.  Reviewed CHO counting diet 5. Follow up: recommended follow up in 4 weeks with PCP - has appt 11/30/16    I have reviewed and agree with the above AWV documentation.   Evelina Dun, FNP

## 2016-11-06 NOTE — Patient Instructions (Signed)
Take Tradjenta 5mg  along with metformin - 1 tablet of Tradjenta daily for 2 weeks.  If tolerating then fill prescription for Jentadueto XR 2.5/1000mg  - take 2 tablets daily (can take at same time or spilt in am and pm dose) Jentadueto = metformin + Tradjenta

## 2016-11-14 DIAGNOSIS — M0609 Rheumatoid arthritis without rheumatoid factor, multiple sites: Secondary | ICD-10-CM | POA: Diagnosis not present

## 2016-11-30 ENCOUNTER — Encounter: Payer: Self-pay | Admitting: Family

## 2016-11-30 ENCOUNTER — Ambulatory Visit (INDEPENDENT_AMBULATORY_CARE_PROVIDER_SITE_OTHER): Payer: BLUE CROSS/BLUE SHIELD | Admitting: Family

## 2016-11-30 VITALS — BP 125/82 | HR 74 | Temp 97.4°F | Ht 70.0 in | Wt 248.6 lb

## 2016-11-30 DIAGNOSIS — M1711 Unilateral primary osteoarthritis, right knee: Secondary | ICD-10-CM | POA: Diagnosis not present

## 2016-11-30 DIAGNOSIS — I10 Essential (primary) hypertension: Secondary | ICD-10-CM

## 2016-11-30 DIAGNOSIS — E785 Hyperlipidemia, unspecified: Secondary | ICD-10-CM | POA: Diagnosis not present

## 2016-11-30 DIAGNOSIS — E1165 Type 2 diabetes mellitus with hyperglycemia: Secondary | ICD-10-CM | POA: Diagnosis not present

## 2016-11-30 DIAGNOSIS — M059 Rheumatoid arthritis with rheumatoid factor, unspecified: Secondary | ICD-10-CM

## 2016-11-30 LAB — BAYER DCA HB A1C WAIVED: HB A1C (BAYER DCA - WAIVED): 8.2 % — ABNORMAL HIGH (ref <7.0)

## 2016-11-30 MED ORDER — KETOROLAC TROMETHAMINE 60 MG/2ML IM SOLN
60.0000 mg | Freq: Once | INTRAMUSCULAR | Status: AC
  Administered 2016-11-30: 60 mg via INTRAMUSCULAR

## 2016-11-30 NOTE — Patient Instructions (Signed)
Rheumatoid Arthritis Rheumatoid arthritis (RA) is a long-term (chronic) disease that causes inflammation in your joints. RA may start slowly. It usually affects the small joints of the hands and feet. Usually, the same joints are affected on both sides of your body. Inflammation from RA can also affect other parts of your body, including your heart, eyes, or lungs. RA is an autoimmune disease. That means that your body's defense system (immune system) mistakenly attacks healthy body tissues. There is no cure for RA, but medicines can help your symptoms and halt or slow down the progression of the disease. What are the causes? The exact cause of RA is not known. What increases the risk? This condition is more likely to develop in:  Women.  People who have a family history of RA or other autoimmune diseases.  What are the signs or symptoms? Symptoms of this condition vary from person to person. Symptoms usually start gradually. They are often worse in the morning. The first symptom may be morning stiffness that lasts longer than 30 minutes. As RA progresses, symptoms may include:  Pain, stiffness, swelling, warmth, and tenderness in joints on both sides of your body.  Loss of energy.  Loss of appetite.  Weight loss.  Low-grade fever.  Dry eyes and dry mouth.  Firm lumps (rheumatoid nodules) that grow beneath your skin in areas such as your forearm bones near your elbows and on your hands.  Changes in the appearance of joints (deformity) and loss of joint function.  Symptoms of RA often come and go. Sometimes, symptoms get worse for a period of time. These are called flares. How is this diagnosed? This condition is diagnosed based on your symptoms, medical history, and physical exam. You may have X-rays or MRI to check for the type of joint changes that are caused by RA. You may also have blood tests to look for:  Proteins (antibodies) that your immune system may make if you have RA.  They include rheumatoid factor (RF) and anti-CCP. ? When blood tests show these proteins, you are said to have "seropositive RA." ? When blood tests do not show these proteins, you may have "seronegative RA."  Inflammation in your blood.  A low number of red blood cells (anemia).  How is this treated? The goals of treatment are to relieve pain, reduce inflammation, and slow down or stop joint damage and disability. Treatment may include:  Lifestyle changes. It is important to rest, eat a healthy diet, and exercise.  Medicines. Your health care provider may adjust your medicines every 3 months until treatment goals are reached. Common medicines include: ? Pain relievers (analgesics). ? Corticosteroids and NSAIDs to reduce inflammation. ? Disease-modifying antirheumatic drugs (DMARDs) to try to slow the course of the disease. ? Biologic response modifiers to reduce inflammation and damage.  Physical therapy and occupational therapy.  Surgery, if you have severe joint damage. Joint replacement or fusing of joints may be needed.  Your health care provider will work with you to identify the best treatment option for you based on assessment of the overall disease activity in your body. Follow these instructions at home:  Take over-the-counter and prescription medicines only as told by your health care provider.  Start an exercise program as told by your health care provider.  Rest when you are having a flare.  Return to your normal activities as told by your health care provider. Ask your health care provider what activities are safe for you.  Keep all   follow-up visits as told by your health care provider. This is important. Where to find more information:  [employer]: www.rheumatology.[place]: www.arthritis.org Contact a health care provider if:  You have a flare-up of RA symptoms.  You have a fever.  You have side effects from your  medicines. Get help right away if:  You have chest pain.  You have trouble breathing.  You quickly develop a hot, painful joint that is more severe than your usual joint aches. This information is not intended to replace advice given to you by your health care provider. Make sure you discuss any questions you have with your health care provider. Document Released: 03/23/2000 Document Revised: 08/30/2015 Document Reviewed: 01/06/2015 Elsevier Interactive Patient Education  2017 [employer].

## 2016-11-30 NOTE — Progress Notes (Signed)
Subjective:    Patient ID: [name], male    DOB: 1955-12-01, 61 y.o.   MRN: [id number]  PT presents to the office today for chronic follow up. Pt has rheumatoid arthritis and followed by rheumatologists every 3 months.  PT is followed by ortho  Hypertension  This is a chronic problem. The current episode started more than 1 year ago. The problem has been resolved since onset. The problem is controlled. Pertinent negatives include no blurred vision, malaise/fatigue, peripheral edema or shortness of breath. Risk factors for coronary artery disease include dyslipidemia, diabetes mellitus, obesity and sedentary lifestyle. The current treatment provides moderate improvement. There is no history of kidney disease, CAD/MI, CVA or heart failure.  Diabetes  He presents for his follow-up diabetic visit. He has type 2 diabetes mellitus. His disease course has been worsening. Hypoglycemia symptoms include dizziness. Pertinent negatives for diabetes include no blurred vision, no foot paresthesias and no visual change. There are no hypoglycemic complications. Symptoms are stable. There are no diabetic complications. Pertinent negatives for diabetic complications include no CVA, heart disease, nephropathy or peripheral neuropathy. Risk factors for coronary artery disease include diabetes mellitus, dyslipidemia, family history, obesity, male sex and sedentary lifestyle. He is following a generally unhealthy diet. His breakfast blood glucose range is generally 140-180 mg/dl. Eye exam is current.  Hyperlipidemia  This is a chronic problem. The current episode started more than 1 year ago. The problem is controlled. Recent lipid tests were reviewed and are normal. Exacerbating diseases include obesity. Pertinent negatives include no shortness of breath. Current antihyperlipidemic treatment includes statins. The current treatment provides moderate improvement of lipids.  Arthritis  Presents for follow-up visit. He  complains of pain, stiffness and joint swelling. Affected locations include the right MCP, left MCP, left shoulder, right shoulder, right knee and left knee (back). His pain is at a severity of 10/10.      Review of Systems  Constitutional: Negative for malaise/fatigue.  Eyes: Negative for blurred vision.  Respiratory: Negative for shortness of breath.   Musculoskeletal: Positive for arthralgias, arthritis, joint swelling and stiffness.  Neurological: Positive for dizziness.  All other systems reviewed and are negative.      Objective:   Physical Exam  Constitutional: He is oriented to person, place, and time. He appears well-developed and well-nourished. No distress.  HENT:  Head: Normocephalic.  Right Ear: External ear normal.  Left Ear: External ear normal.  Mouth/Throat: Oropharynx is clear and moist.  Eyes: Pupils are equal, round, and reactive to light. Right eye exhibits no discharge. Left eye exhibits no discharge.  Neck: Normal range of motion. Neck supple. No thyromegaly present.  Cardiovascular: Normal rate, regular rhythm, normal heart sounds and intact distal pulses.   No murmur heard. Pulmonary/Chest: Effort normal and breath sounds normal. No respiratory distress. He has no wheezes.  Abdominal: Soft. Bowel sounds are normal. He exhibits no distension. There is no tenderness.  Musculoskeletal: Normal range of motion. He exhibits no edema or tenderness.  Neurological: He is alert and oriented to person, place, and time.  Skin: Skin is warm and dry. No rash noted. No erythema.  Psychiatric: He has a normal mood and affect. His behavior is normal. Judgment and thought content normal.  Vitals reviewed.     BP 125/82   Pulse 74   Temp (!) 97.4 F (36.3 C) (Oral)   Ht _0  (1.778 m)   Wt 248 lb 9.6 oz (112.8 kg)   BMI 35.67 kg/m  Assessment & Plan:  1. Essential hypertension - CMP14+EGFR  2. Type 2 diabetes mellitus with hyperglycemia, without  long-term current use of insulin (HCC) - Bayer DCA Hb A1c Waived - CMP14+EGFR - Microalbumin / creatinine urine ratio  3. Rheumatoid arthritis with positive rheumatoid factor, involving unspecified site ([place]) - CMP14+EGFR  4. Morbid obesity (HCC) - CMP14+EGFR  5. Hyperlipidemia, unspecified hyperlipidemia type - CMP14+EGFR - Lipid panel  6. Primary localized osteoarthritis of right knee - CMP14+EGFR - ketorolac (TORADOL) injection 60 mg; Inject 2 mLs (60 mg total) into the muscle once.   Continue all meds Labs pending Health Maintenance reviewed Diet and exercise encouraged RTO 4 months   Evelina Dun, FNP

## 2016-12-01 LAB — LIPID PANEL
CHOLESTEROL TOTAL: 114 mg/dL (ref 100–199)
Chol/HDL Ratio: 3.1 ratio (ref 0.0–5.0)
HDL: 37 mg/dL — AB (ref >39)
LDL Calculated: 63 mg/dL (ref 0–99)
TRIGLYCERIDES: 72 mg/dL (ref 0–149)
VLDL Cholesterol Cal: 14 mg/dL (ref 5–40)

## 2016-12-01 LAB — MICROALBUMIN / CREATININE URINE RATIO
Creatinine, Urine: 222 mg/dL
MICROALB/CREAT RATIO: 9.6 mg/g{creat} (ref 0.0–30.0)
Microalbumin, Urine: 21.3 ug/mL

## 2016-12-01 LAB — CMP14+EGFR
A/G RATIO: 1.7 (ref 1.2–2.2)
ALK PHOS: 69 IU/L (ref 39–117)
ALT: 19 IU/L (ref 0–44)
AST: 21 IU/L (ref 0–40)
Albumin: 4.5 g/dL (ref 3.6–4.8)
BUN/Creatinine Ratio: 13 (ref 10–24)
BUN: 15 mg/dL (ref 8–27)
CHLORIDE: 101 mmol/L (ref 96–106)
CO2: 23 mmol/L (ref 20–29)
Calcium: 9.4 mg/dL (ref 8.6–10.2)
Creatinine, Ser: 1.13 mg/dL (ref 0.76–1.27)
GFR calc Af Amer: 80 mL/min/{1.73_m2} (ref >59)
GFR calc non Af Amer: 69 mL/min/{1.73_m2} (ref >59)
GLOBULIN, TOTAL: 2.7 g/dL (ref 1.5–4.5)
Glucose: 143 mg/dL — ABNORMAL HIGH (ref 65–99)
POTASSIUM: 4.2 mmol/L (ref 3.5–5.2)
SODIUM: 141 mmol/L (ref 134–144)
Total Protein: 7.2 g/dL (ref 6.0–8.5)

## 2016-12-04 ENCOUNTER — Other Ambulatory Visit: Payer: Self-pay | Admitting: Family

## 2016-12-06 ENCOUNTER — Other Ambulatory Visit: Payer: Self-pay | Admitting: Family

## 2016-12-06 MED ORDER — EMPAGLIFLOZIN 10 MG PO TABS: 10.0000 mg | ORAL_TABLET | Freq: Every day | ORAL | 3 refills | Status: DC

## 2016-12-07 ENCOUNTER — Telehealth: Payer: Self-pay | Admitting: Family

## 2016-12-07 MED ORDER — EMPAGLIFLOZIN 10 MG PO TABS: 10.0000 mg | ORAL_TABLET | Freq: Every day | ORAL | 3 refills | Status: DC

## 2016-12-07 NOTE — Telephone Encounter (Signed)
Pt notified of RX 

## 2016-12-12 DIAGNOSIS — M0609 Rheumatoid arthritis without rheumatoid factor, multiple sites: Secondary | ICD-10-CM | POA: Diagnosis not present

## 2016-12-12 DIAGNOSIS — Z79899 Other long term (current) drug therapy: Secondary | ICD-10-CM | POA: Diagnosis not present

## 2016-12-24 DIAGNOSIS — M2550 Pain in unspecified joint: Secondary | ICD-10-CM | POA: Diagnosis not present

## 2016-12-24 DIAGNOSIS — M150 Primary generalized (osteo)arthritis: Secondary | ICD-10-CM | POA: Diagnosis not present

## 2016-12-24 DIAGNOSIS — M7989 Other specified soft tissue disorders: Secondary | ICD-10-CM | POA: Diagnosis not present

## 2016-12-24 DIAGNOSIS — M0609 Rheumatoid arthritis without rheumatoid factor, multiple sites: Secondary | ICD-10-CM | POA: Diagnosis not present

## 2017-01-04 ENCOUNTER — Telehealth: Payer: Self-pay | Admitting: Family

## 2017-01-04 DIAGNOSIS — E1165 Type 2 diabetes mellitus with hyperglycemia: Secondary | ICD-10-CM

## 2017-01-07 NOTE — Telephone Encounter (Signed)
Endocrinologists referral sent

## 2017-01-11 DIAGNOSIS — M0609 Rheumatoid arthritis without rheumatoid factor, multiple sites: Secondary | ICD-10-CM | POA: Diagnosis not present

## 2017-01-15 ENCOUNTER — Other Ambulatory Visit: Payer: Self-pay | Admitting: Family Medicine

## 2017-01-16 DIAGNOSIS — M25531 Pain in right wrist: Secondary | ICD-10-CM | POA: Diagnosis not present

## 2017-01-21 DIAGNOSIS — M069 Rheumatoid arthritis, unspecified: Secondary | ICD-10-CM | POA: Diagnosis not present

## 2017-01-21 DIAGNOSIS — M1811 Unilateral primary osteoarthritis of first carpometacarpal joint, right hand: Secondary | ICD-10-CM | POA: Diagnosis not present

## 2017-01-21 DIAGNOSIS — M25531 Pain in right wrist: Secondary | ICD-10-CM | POA: Diagnosis not present

## 2017-01-21 DIAGNOSIS — M19131 Post-traumatic osteoarthritis, right wrist: Secondary | ICD-10-CM | POA: Diagnosis not present

## 2017-01-23 ENCOUNTER — Other Ambulatory Visit: Payer: Self-pay | Admitting: Orthopedic Surgery

## 2017-02-11 DIAGNOSIS — I10 Essential (primary) hypertension: Secondary | ICD-10-CM | POA: Diagnosis not present

## 2017-02-11 DIAGNOSIS — E7800 Pure hypercholesterolemia, unspecified: Secondary | ICD-10-CM | POA: Diagnosis not present

## 2017-02-11 DIAGNOSIS — M0609 Rheumatoid arthritis without rheumatoid factor, multiple sites: Secondary | ICD-10-CM | POA: Diagnosis not present

## 2017-02-11 DIAGNOSIS — E1165 Type 2 diabetes mellitus with hyperglycemia: Secondary | ICD-10-CM | POA: Diagnosis not present

## 2017-02-13 ENCOUNTER — Other Ambulatory Visit: Payer: Self-pay | Admitting: Family

## 2017-02-18 ENCOUNTER — Ambulatory Visit: Payer: BLUE CROSS/BLUE SHIELD | Admitting: Family

## 2017-02-18 ENCOUNTER — Encounter: Payer: Self-pay | Admitting: Family

## 2017-02-18 VITALS — BP 126/79 | HR 82 | Temp 97.9°F | Ht 70.0 in | Wt 248.0 lb

## 2017-02-18 DIAGNOSIS — E1169 Type 2 diabetes mellitus with other specified complication: Secondary | ICD-10-CM | POA: Diagnosis not present

## 2017-02-18 DIAGNOSIS — J069 Acute upper respiratory infection, unspecified: Secondary | ICD-10-CM | POA: Diagnosis not present

## 2017-02-18 DIAGNOSIS — I10 Essential (primary) hypertension: Secondary | ICD-10-CM | POA: Diagnosis not present

## 2017-02-18 DIAGNOSIS — E1159 Type 2 diabetes mellitus with other circulatory complications: Secondary | ICD-10-CM | POA: Diagnosis not present

## 2017-02-18 DIAGNOSIS — E1165 Type 2 diabetes mellitus with hyperglycemia: Secondary | ICD-10-CM

## 2017-02-18 DIAGNOSIS — E785 Hyperlipidemia, unspecified: Secondary | ICD-10-CM

## 2017-02-18 DIAGNOSIS — I152 Hypertension secondary to endocrine disorders: Secondary | ICD-10-CM

## 2017-02-18 LAB — BAYER DCA HB A1C WAIVED: HB A1C: 8.2 % — AB (ref <7.0)

## 2017-02-18 MED ORDER — METFORMIN HCL 1000 MG PO TABS: ORAL_TABLET | ORAL | 1 refills | Status: DC

## 2017-02-18 MED ORDER — FLUTICASONE PROPIONATE 50 MCG/ACT NA SUSP: 2.0000 | Freq: Every day | NASAL | 6 refills | Status: DC

## 2017-02-18 MED ORDER — BENZONATATE 200 MG PO CAPS: 200.0000 mg | ORAL_CAPSULE | Freq: Three times a day (TID) | ORAL | 1 refills | Status: DC | PRN

## 2017-02-18 NOTE — Progress Notes (Signed)
Subjective:    Patient ID: [name], male    DOB: [birth year], 61 y.o.   MRN: [id number]  Cough  This is a new problem. The current episode started in the past 7 days. The problem has been waxing and waning. The problem occurs every few minutes. The cough is non-productive. Pertinent negatives include no chills, ear congestion, ear pain, fever, headaches, myalgias, rhinorrhea, sore throat or shortness of breath. He has tried OTC cough suppressant for the symptoms. The treatment provided mild relief. There is no history of asthma.  Diabetes  He presents for his follow-up diabetic visit. He has type 2 diabetes mellitus. His disease course has been stable. Pertinent negatives for hypoglycemia include no confusion, headaches, nervousness/anxiousness or speech difficulty. There are no hypoglycemic complications. His breakfast blood glucose range is generally 140-180 mg/dl.  Hypertension  This is a chronic problem. The current episode started more than 1 year ago. The problem has been resolved since onset. The problem is controlled. Associated symptoms include malaise/fatigue. Pertinent negatives include no headaches, peripheral edema or shortness of breath. The current treatment provides moderate improvement.  Hyperlipidemia  This is a chronic problem. The current episode started more than 1 year ago. Recent lipid tests were reviewed and are normal. Exacerbating diseases include obesity. Pertinent negatives include no myalgias or shortness of breath. Current antihyperlipidemic treatment includes statins. The current treatment provides moderate improvement of lipids. Risk factors for coronary artery disease include diabetes mellitus, dyslipidemia, hypertension, male sex, obesity and a sedentary lifestyle.      Review of Systems  Constitutional: Positive for malaise/fatigue. Negative for chills and fever.  HENT: Negative for ear pain, rhinorrhea and sore throat.   Respiratory: Positive for cough.  Negative for shortness of breath.   Musculoskeletal: Negative for myalgias.  Neurological: Negative for speech difficulty and headaches.  Psychiatric/Behavioral: Negative for confusion. The patient is not nervous/anxious.   All other systems reviewed and are negative.      Objective:   Physical Exam  Constitutional: He is oriented to person, place, and time. He appears well-developed and well-nourished. No distress.  HENT:  Head: Normocephalic.  Nose: Mucosal edema and rhinorrhea present.  Eyes: Pupils are equal, round, and reactive to light. Right eye exhibits no discharge. Left eye exhibits no discharge.  Neck: Normal range of motion. Neck supple. No thyromegaly present.  Cardiovascular: Normal rate, regular rhythm, normal heart sounds and intact distal pulses.  No murmur heard. Pulmonary/Chest: Effort normal. No respiratory distress. He has decreased breath sounds. He has no wheezes.  Abdominal: Soft. Bowel sounds are normal. He exhibits no distension. There is no tenderness.  Musculoskeletal: Normal range of motion. He exhibits no edema or tenderness.  Neurological: He is alert and oriented to person, place, and time.  Skin: Skin is warm and dry. No rash noted. No erythema.  Psychiatric: He has a normal mood and affect. His behavior is normal. Judgment and thought content normal.  Vitals reviewed.  Diabetic Foot Exam - Simple   Simple Foot Form Diabetic Foot exam was performed with the following findings:  Yes 02/18/2017  2:40 PM  Visual Inspection No deformities, no ulcerations, no other skin breakdown bilaterally:  Yes Sensation Testing Intact to touch and monofilament testing bilaterally:  Yes Pulse Check Posterior Tibialis and Dorsalis pulse intact bilaterally:  Yes Comments      BP 126/79   Pulse 82   Temp 97.9 F (36.6 C) (Oral)   Ht _0  (1.778 m)   Wt  248 lb (112.5 kg)   BMI 35.58 kg/m      Assessment & Plan:  1. Viral upper respiratory tract  infection Pt wants to stop Jardiance and go back to "regular metformin" and Trulicity  Low carb diet Will increase Trulicity in a month - metFORMIN (GLUCOPHAGE) 1000 MG tablet; 1000 mg in AM and 1500 mg in PM  Dispense: 225 tablet; Refill: 1 - benzonatate (TESSALON) 200 MG capsule; Take 1 capsule (200 mg total) 3 (three) times daily as needed by mouth.  Dispense: 30 capsule; Refill: 1 - fluticasone (FLONASE) 50 MCG/ACT nasal spray; Place 2 sprays daily into both nostrils.  Dispense: 16 g; Refill: 6 - CMP14+EGFR  2. Hypertension associated with diabetes ([place]) - CMP14+EGFR  3. Type 2 diabetes mellitus with hyperglycemia, without long-term current use of insulin (HCC) - Bayer DCA Hb A1c Waived - CMP14+EGFR  4. Hyperlipidemia associated with type 2 diabetes mellitus (HCC) - CMP14+EGFR  5. Morbid obesity ([place]) - CMP14+EGFR   Continue all meds Labs pending Health Maintenance reviewed Diet and exercise encouraged RTO 3 months   Evelina Dun, FNP

## 2017-02-18 NOTE — Patient Instructions (Signed)
Cough, Adult Coughing is a reflex that clears your throat and your airways. Coughing helps to heal and protect your lungs. It is normal to cough occasionally, but a cough that happens with other symptoms or lasts a long time may be a sign of a condition that needs treatment. A cough may last only 2-3 weeks (acute), or it may last longer than 8 weeks (chronic). What are the causes? Coughing is commonly caused by:  Breathing in substances that irritate your lungs.  A viral or bacterial respiratory infection.  Allergies.  Asthma.  Postnasal drip.  Smoking.  Acid backing up from the stomach into the esophagus (gastroesophageal reflux).  Certain medicines.  Chronic lung problems, including COPD (or rarely, lung cancer).  Other medical conditions such as heart failure.  Follow these instructions at home: Pay attention to any changes in your symptoms. Take these actions to help with your discomfort:  Take medicines only as told by your health care provider. ? If you were prescribed an antibiotic medicine, take it as told by your health care provider. Do not stop taking the antibiotic even if you start to feel better. ? Talk with your health care provider before you take a cough suppressant medicine.  Drink enough fluid to keep your urine clear or pale yellow.  If the air is dry, use a cold steam vaporizer or humidifier in your bedroom or your home to help loosen secretions.  Avoid anything that causes you to cough at work or at home.  If your cough is worse at night, try sleeping in a semi-upright position.  Avoid cigarette smoke. If you smoke, quit smoking. If you need help quitting, ask your health care provider.  Avoid caffeine.  Avoid alcohol.  Rest as needed.  Contact a health care provider if:  You have new symptoms.  You cough up pus.  Your cough does not get better after 2-3 weeks, or your cough gets worse.  You cannot control your cough with suppressant  medicines and you are losing sleep.  You develop pain that is getting worse or pain that is not controlled with pain medicines.  You have a fever.  You have unexplained weight loss.  You have night sweats. Get help right away if:  You cough up blood.  You have difficulty breathing.  Your heartbeat is very fast. This information is not intended to replace advice given to you by your health care provider. Make sure you discuss any questions you have with your health care provider. Document Released: 09/22/2010 Document Revised: 09/01/2015 Document Reviewed: 06/02/2014 Elsevier Interactive Patient Education  2017 [employer]  

## 2017-02-19 LAB — CMP14+EGFR
A/G RATIO: 1.7 (ref 1.2–2.2)
ALBUMIN: 4.5 g/dL (ref 3.6–4.8)
ALT: 16 IU/L (ref 0–44)
AST: 22 IU/L (ref 0–40)
Alkaline Phosphatase: 66 IU/L (ref 39–117)
BUN / CREAT RATIO: 10 (ref 10–24)
BUN: 14 mg/dL (ref 8–27)
CHLORIDE: 97 mmol/L (ref 96–106)
CO2: 22 mmol/L (ref 20–29)
Calcium: 9.5 mg/dL (ref 8.6–10.2)
Creatinine, Ser: 1.36 mg/dL — ABNORMAL HIGH (ref 0.76–1.27)
GFR calc non Af Amer: 55 mL/min/{1.73_m2} — ABNORMAL LOW (ref >59)
GFR, EST AFRICAN AMERICAN: 64 mL/min/{1.73_m2} (ref >59)
GLOBULIN, TOTAL: 2.7 g/dL (ref 1.5–4.5)
Glucose: 133 mg/dL — ABNORMAL HIGH (ref 65–99)
POTASSIUM: 3.9 mmol/L (ref 3.5–5.2)
SODIUM: 137 mmol/L (ref 134–144)
TOTAL PROTEIN: 7.2 g/dL (ref 6.0–8.5)

## 2017-02-21 ENCOUNTER — Other Ambulatory Visit: Payer: Self-pay | Admitting: Family

## 2017-02-21 MED ORDER — DULAGLUTIDE 1.5 MG/0.5ML ~~LOC~~ SOAJ: 1.5000 mg | SUBCUTANEOUS | 3 refills | Status: DC

## 2017-03-04 ENCOUNTER — Ambulatory Visit: Payer: BLUE CROSS/BLUE SHIELD | Admitting: Family

## 2017-03-11 ENCOUNTER — Encounter (HOSPITAL_BASED_OUTPATIENT_CLINIC_OR_DEPARTMENT_OTHER): Payer: Self-pay | Admitting: *Deleted

## 2017-03-11 ENCOUNTER — Other Ambulatory Visit: Payer: Self-pay

## 2017-03-14 ENCOUNTER — Encounter (HOSPITAL_BASED_OUTPATIENT_CLINIC_OR_DEPARTMENT_OTHER): Payer: Self-pay | Admitting: *Deleted

## 2017-03-14 ENCOUNTER — Ambulatory Visit (HOSPITAL_BASED_OUTPATIENT_CLINIC_OR_DEPARTMENT_OTHER)
Admission: RE | Admit: 2017-03-14 | Discharge: 2017-03-14 | Disposition: A | Discharge status: Home / Self Care | Payer: BLUE CROSS/BLUE SHIELD | Source: Ambulatory Visit | Attending: Orthopedic Surgery | Admitting: Orthopedic Surgery

## 2017-03-14 ENCOUNTER — Ambulatory Visit (HOSPITAL_BASED_OUTPATIENT_CLINIC_OR_DEPARTMENT_OTHER): Payer: BLUE CROSS/BLUE SHIELD | Admitting: Anesthesiology

## 2017-03-14 ENCOUNTER — Other Ambulatory Visit: Payer: Self-pay

## 2017-03-14 ENCOUNTER — Encounter (HOSPITAL_BASED_OUTPATIENT_CLINIC_OR_DEPARTMENT_OTHER)
Admission: RE | Disposition: A | Discharge status: Home / Self Care | Payer: Self-pay | Source: Ambulatory Visit | Attending: Orthopedic Surgery

## 2017-03-14 DIAGNOSIS — M1811 Unilateral primary osteoarthritis of first carpometacarpal joint, right hand: Secondary | ICD-10-CM | POA: Insufficient documentation

## 2017-03-14 DIAGNOSIS — E785 Hyperlipidemia, unspecified: Secondary | ICD-10-CM | POA: Insufficient documentation

## 2017-03-14 DIAGNOSIS — E559 Vitamin D deficiency, unspecified: Secondary | ICD-10-CM | POA: Diagnosis not present

## 2017-03-14 DIAGNOSIS — M19041 Primary osteoarthritis, right hand: Secondary | ICD-10-CM | POA: Diagnosis not present

## 2017-03-14 DIAGNOSIS — Z87891 Personal history of nicotine dependence: Secondary | ICD-10-CM | POA: Insufficient documentation

## 2017-03-14 DIAGNOSIS — E119 Type 2 diabetes mellitus without complications: Secondary | ICD-10-CM | POA: Insufficient documentation

## 2017-03-14 DIAGNOSIS — G8918 Other acute postprocedural pain: Secondary | ICD-10-CM | POA: Diagnosis not present

## 2017-03-14 DIAGNOSIS — Z79891 Long term (current) use of opiate analgesic: Secondary | ICD-10-CM | POA: Insufficient documentation

## 2017-03-14 DIAGNOSIS — I10 Essential (primary) hypertension: Secondary | ICD-10-CM | POA: Diagnosis not present

## 2017-03-14 DIAGNOSIS — Z79899 Other long term (current) drug therapy: Secondary | ICD-10-CM | POA: Insufficient documentation

## 2017-03-14 DIAGNOSIS — Z7982 Long term (current) use of aspirin: Secondary | ICD-10-CM | POA: Diagnosis not present

## 2017-03-14 DIAGNOSIS — M069 Rheumatoid arthritis, unspecified: Secondary | ICD-10-CM | POA: Diagnosis not present

## 2017-03-14 DIAGNOSIS — Z96651 Presence of right artificial knee joint: Secondary | ICD-10-CM | POA: Diagnosis not present

## 2017-03-14 DIAGNOSIS — Z791 Long term (current) use of non-steroidal anti-inflammatories (NSAID): Secondary | ICD-10-CM | POA: Diagnosis not present

## 2017-03-14 DIAGNOSIS — M21831 Other specified acquired deformities of right forearm: Secondary | ICD-10-CM | POA: Insufficient documentation

## 2017-03-14 DIAGNOSIS — M19031 Primary osteoarthritis, right wrist: Secondary | ICD-10-CM | POA: Diagnosis not present

## 2017-03-14 HISTORY — DX: Other instability, right wrist: M25.331

## 2017-03-14 HISTORY — PX: WRIST FUSION: SHX839

## 2017-03-14 LAB — GLUCOSE, CAPILLARY
GLUCOSE-CAPILLARY: 161 mg/dL — AB (ref 65–99)
Glucose-Capillary: 159 mg/dL — ABNORMAL HIGH (ref 65–99)

## 2017-03-14 SURGERY — FUSION, JOINT, WRIST
Anesthesia: General | Site: Arm Lower | Laterality: Right

## 2017-03-14 MED ORDER — FENTANYL CITRATE (PF) 100 MCG/2ML IJ SOLN
INTRAMUSCULAR | Status: AC
  Filled 2017-03-14: qty 2

## 2017-03-14 MED ORDER — FENTANYL CITRATE (PF) 100 MCG/2ML IJ SOLN
50.0000 ug | INTRAMUSCULAR | Status: AC | PRN
  Administered 2017-03-14 (×3): 50 ug via INTRAVENOUS

## 2017-03-14 MED ORDER — VANCOMYCIN HCL IN DEXTROSE 1-5 GM/200ML-% IV SOLN
1000.0000 mg | INTRAVENOUS | Status: AC
  Administered 2017-03-14: 1000 mg via INTRAVENOUS

## 2017-03-14 MED ORDER — EPHEDRINE SULFATE 50 MG/ML IJ SOLN
INTRAMUSCULAR | Status: DC | PRN
  Administered 2017-03-14 (×3): 10 mg via INTRAVENOUS

## 2017-03-14 MED ORDER — MIDAZOLAM HCL 2 MG/2ML IJ SOLN
1.0000 mg | INTRAMUSCULAR | Status: DC | PRN
  Administered 2017-03-14: 2 mg via INTRAVENOUS

## 2017-03-14 MED ORDER — PHENYLEPHRINE HCL 10 MG/ML IJ SOLN
INTRAMUSCULAR | Status: AC
  Filled 2017-03-14: qty 2

## 2017-03-14 MED ORDER — DEXAMETHASONE SODIUM PHOSPHATE 10 MG/ML IJ SOLN
INTRAMUSCULAR | Status: DC | PRN
  Administered 2017-03-14: 10 mg via INTRAVENOUS

## 2017-03-14 MED ORDER — LACTATED RINGERS IV SOLN
INTRAVENOUS | Status: DC
  Administered 2017-03-14 (×2): via INTRAVENOUS

## 2017-03-14 MED ORDER — LIDOCAINE HCL (CARDIAC) 20 MG/ML IV SOLN
INTRAVENOUS | Status: DC | PRN
Start: 2017-03-14 — End: 2017-03-14
  Administered 2017-03-14: 100 mg via INTRAVENOUS

## 2017-03-14 MED ORDER — PROPOFOL 10 MG/ML IV BOLUS
INTRAVENOUS | Status: DC | PRN
  Administered 2017-03-14: 150 mg via INTRAVENOUS

## 2017-03-14 MED ORDER — BUPIVACAINE HCL (PF) 0.25 % IJ SOLN
INTRAMUSCULAR | Status: AC
  Filled 2017-03-14: qty 30

## 2017-03-14 MED ORDER — MIDAZOLAM HCL 2 MG/2ML IJ SOLN
INTRAMUSCULAR | Status: AC
  Filled 2017-03-14: qty 2

## 2017-03-14 MED ORDER — ONDANSETRON HCL 4 MG/2ML IJ SOLN
INTRAMUSCULAR | Status: AC
  Filled 2017-03-14: qty 2

## 2017-03-14 MED ORDER — ROPIVACAINE HCL 7.5 MG/ML IJ SOLN
INTRAMUSCULAR | Status: DC | PRN
  Administered 2017-03-14: 20 mL via PERINEURAL

## 2017-03-14 MED ORDER — EPHEDRINE 5 MG/ML INJ
INTRAVENOUS | Status: AC
  Filled 2017-03-14: qty 10

## 2017-03-14 MED ORDER — CHLORHEXIDINE GLUCONATE 4 % EX LIQD: 60.0000 mL | Freq: Once | CUTANEOUS | Status: DC

## 2017-03-14 MED ORDER — SUCCINYLCHOLINE CHLORIDE 200 MG/10ML IV SOSY
PREFILLED_SYRINGE | INTRAVENOUS | Status: AC
  Filled 2017-03-14: qty 10

## 2017-03-14 MED ORDER — LIDOCAINE 2% (20 MG/ML) 5 ML SYRINGE
INTRAMUSCULAR | Status: AC
  Filled 2017-03-14: qty 5

## 2017-03-14 MED ORDER — VANCOMYCIN HCL IN DEXTROSE 1-5 GM/200ML-% IV SOLN
INTRAVENOUS | Status: AC
  Filled 2017-03-14: qty 200

## 2017-03-14 MED ORDER — PHENYLEPHRINE 40 MCG/ML (10ML) SYRINGE FOR IV PUSH (FOR BLOOD PRESSURE SUPPORT)
PREFILLED_SYRINGE | INTRAVENOUS | Status: AC
  Filled 2017-03-14: qty 10

## 2017-03-14 MED ORDER — PHENYLEPHRINE HCL 10 MG/ML IJ SOLN
INTRAMUSCULAR | Status: DC | PRN
  Administered 2017-03-14: 25 ug/min via INTRAVENOUS

## 2017-03-14 MED ORDER — SCOPOLAMINE 1 MG/3DAYS TD PT72: 1.0000 | MEDICATED_PATCH | Freq: Once | TRANSDERMAL | Status: DC | PRN

## 2017-03-14 MED ORDER — DEXAMETHASONE SODIUM PHOSPHATE 10 MG/ML IJ SOLN
INTRAMUSCULAR | Status: AC
  Filled 2017-03-14: qty 1

## 2017-03-14 SURGICAL SUPPLY — 78 items
BAG DECANTER FOR FLEXI CONT (MISCELLANEOUS) IMPLANT
BIT DRILL ACUTRAK 2 MINI (BIT) ×1 IMPLANT
BLADE AVERAGE 25MMX9MM (BLADE)
BLADE AVERAGE 25X9 (BLADE) IMPLANT
BLADE MINI RND TIP GREEN BEAV (BLADE) ×3 IMPLANT
BLADE SURG 15 STRL LF DISP TIS (BLADE) ×1 IMPLANT
BLADE SURG 15 STRL SS (BLADE) ×2
BNDG CMPR 9X4 STRL LF SNTH (GAUZE/BANDAGES/DRESSINGS) ×1
BNDG COHESIVE 3X5 TAN STRL LF (GAUZE/BANDAGES/DRESSINGS) ×3 IMPLANT
BNDG ESMARK 4X9 LF (GAUZE/BANDAGES/DRESSINGS) ×3 IMPLANT
BNDG GAUZE ELAST 4 BULKY (GAUZE/BANDAGES/DRESSINGS) ×3 IMPLANT
BUR EGG 3PK/BX (BURR) ×6 IMPLANT
CANISTER SUCT 1200ML W/VALVE (MISCELLANEOUS) IMPLANT
CHLORAPREP W/TINT 26ML (MISCELLANEOUS) ×3 IMPLANT
CORD BIPOLAR FORCEPS 12FT (ELECTRODE) ×3 IMPLANT
COVER BACK TABLE 60X90IN (DRAPES) ×3 IMPLANT
COVER MAYO STAND STRL (DRAPES) ×3 IMPLANT
CUFF TOURNIQUET SINGLE 18IN (TOURNIQUET CUFF) ×3 IMPLANT
DECANTER SPIKE VIAL GLASS SM (MISCELLANEOUS) IMPLANT
DRAIN TLS ROUND 10FR (DRAIN) IMPLANT
DRAPE EXTREMITY T 121X128X90 (DRAPE) ×3 IMPLANT
DRAPE INCISE IOBAN 66X45 STRL (DRAPES) IMPLANT
DRAPE LAPAROTOMY 100X72 PEDS (DRAPES) IMPLANT
DRAPE OEC MINIVIEW 54X84 (DRAPES) ×3 IMPLANT
DRAPE SURG 17X23 STRL (DRAPES) ×3 IMPLANT
DRILL ACUTRAK 2 MINI (BIT) ×3
DRSG PAD ABDOMINAL 8X10 ST (GAUZE/BANDAGES/DRESSINGS) IMPLANT
ELECT REM PT RETURN 9FT ADLT (ELECTROSURGICAL)
ELECTRODE REM PT RTRN 9FT ADLT (ELECTROSURGICAL) IMPLANT
GAUZE SPONGE 4X4 12PLY STRL (GAUZE/BANDAGES/DRESSINGS) ×3 IMPLANT
GAUZE SPONGE 4X4 16PLY XRAY LF (GAUZE/BANDAGES/DRESSINGS) IMPLANT
GAUZE XEROFORM 1X8 LF (GAUZE/BANDAGES/DRESSINGS) ×3 IMPLANT
GLOVE BIOGEL PI IND STRL 7.0 (GLOVE) ×2 IMPLANT
GLOVE BIOGEL PI IND STRL 8 (GLOVE) ×1 IMPLANT
GLOVE BIOGEL PI IND STRL 8.5 (GLOVE) ×1 IMPLANT
GLOVE BIOGEL PI INDICATOR 7.0 (GLOVE) ×4
GLOVE BIOGEL PI INDICATOR 8 (GLOVE) ×2
GLOVE BIOGEL PI INDICATOR 8.5 (GLOVE) ×2
GLOVE ECLIPSE 6.5 STRL STRAW (GLOVE) ×3 IMPLANT
GLOVE SURG ORTHO 8.0 STRL STRW (GLOVE) ×3 IMPLANT
GOWN STRL REUS W/ TWL LRG LVL3 (GOWN DISPOSABLE) ×1 IMPLANT
GOWN STRL REUS W/TWL LRG LVL3 (GOWN DISPOSABLE) ×3
GOWN STRL REUS W/TWL XL LVL3 (GOWN DISPOSABLE) ×6 IMPLANT
GUIDEWIRE ORTHO MINI ACTK .045 (WIRE) ×9 IMPLANT
LOOP VESSEL MAXI BLUE (MISCELLANEOUS) IMPLANT
NS IRRIG 1000ML POUR BTL (IV SOLUTION) ×3 IMPLANT
PACK BASIN DAY SURGERY FS (CUSTOM PROCEDURE TRAY) ×3 IMPLANT
PAD CAST 3X4 CTTN HI CHSV (CAST SUPPLIES) ×1 IMPLANT
PADDING CAST ABS 3INX4YD NS (CAST SUPPLIES) ×2
PADDING CAST ABS 4INX4YD NS (CAST SUPPLIES) ×2
PADDING CAST ABS COTTON 3X4 (CAST SUPPLIES) ×1 IMPLANT
PADDING CAST ABS COTTON 4X4 ST (CAST SUPPLIES) ×1 IMPLANT
PADDING CAST COTTON 3X4 STRL (CAST SUPPLIES) ×3
PENCIL BUTTON HOLSTER BLD 10FT (ELECTRODE) IMPLANT
SCREW ACUTRAK 2 MINI 26MM (Screw) ×6 IMPLANT
SLEEVE SCD COMPRESS KNEE MED (MISCELLANEOUS) ×3 IMPLANT
SPLINT PLASTER CAST XFAST 3X15 (CAST SUPPLIES) ×14 IMPLANT
SPLINT PLASTER XTRA FASTSET 3X (CAST SUPPLIES) ×28
SPONGE LAP 4X18 X RAY DECT (DISPOSABLE) IMPLANT
STOCKINETTE 4X48 STRL (DRAPES) ×3 IMPLANT
SUCTION FRAZIER HANDLE 10FR (MISCELLANEOUS)
SUCTION TUBE FRAZIER 10FR DISP (MISCELLANEOUS) IMPLANT
SUT CHROMIC 4 0 RB 1X27 (SUTURE) IMPLANT
SUT ETHIBOND 3-0 V-5 (SUTURE) IMPLANT
SUT ETHILON 4 0 PS 2 18 (SUTURE) ×3 IMPLANT
SUT MERSILENE 4 0 P 3 (SUTURE) IMPLANT
SUT NOVA NAB GS-22 2 0 T19 (SUTURE) IMPLANT
SUT VIC AB 0 CT1 27 (SUTURE)
SUT VIC AB 0 CT1 27XBRD ANBCTR (SUTURE) IMPLANT
SUT VIC AB 2-0 SH 27 (SUTURE) ×3
SUT VIC AB 2-0 SH 27XBRD (SUTURE) ×1 IMPLANT
SUT VICRYL 4-0 PS2 18IN ABS (SUTURE) ×3 IMPLANT
SYR BULB 3OZ (MISCELLANEOUS) ×3 IMPLANT
SYR CONTROL 10ML LL (SYRINGE) IMPLANT
TOWEL OR 17X24 6PK STRL BLUE (TOWEL DISPOSABLE) ×3 IMPLANT
TUBE CONNECTING 20'X1/4 (TUBING)
TUBE CONNECTING 20X1/4 (TUBING) IMPLANT
UNDERPAD 30X30 (UNDERPADS AND DIAPERS) ×3 IMPLANT

## 2017-03-14 NOTE — Discharge Instructions (Addendum)

## 2017-03-14 NOTE — Anesthesia Postprocedure Evaluation (Signed)
Anesthesia Post Note  Patient: [name]  Procedure(s) Performed: SCAPHOID EXCISION ULNAR FOUR BONE FUSION RIGHT (Right Arm Lower)     Patient location during evaluation: PACU Anesthesia Type: General Level of consciousness: awake and alert Pain management: pain level controlled Vital Signs Assessment: post-procedure vital signs reviewed and stable Respiratory status: spontaneous breathing, nonlabored ventilation and respiratory function stable Cardiovascular status: blood pressure returned to baseline and stable Postop Assessment: no apparent nausea or vomiting Anesthetic complications: no    Last Vitals:  Vitals:   03/14/17 1207 03/14/17 1215  BP: 126/82 122/68  Pulse: (!) 114 98  Resp: (!) 25 17  Temp: 36.4 C   SpO2: 94% 98%    Last Pain:  Vitals:   03/14/17 1215  TempSrc:   PainSc: 0-No pain                 Catalina Gravel

## 2017-03-14 NOTE — Anesthesia Procedure Notes (Signed)
Procedure Name: LMA Insertion Date/Time: 03/14/2017 10:11 AM Performed by: Willa Frater, CRNA Pre-anesthesia Checklist: Patient identified, Emergency Drugs available, Suction available and Patient being monitored Patient Re-evaluated:Patient Re-evaluated prior to induction Oxygen Delivery Method: Circle system utilized Preoxygenation: Pre-oxygenation with 100% oxygen Induction Type: IV induction Ventilation: Mask ventilation without difficulty LMA: LMA inserted LMA Size: 5.0 Number of attempts: 1 Airway Equipment and Method: Bite block Placement Confirmation: positive ETCO2 Tube secured with: Tape Dental Injury: Teeth and Oropharynx as per pre-operative assessment

## 2017-03-14 NOTE — Addendum Note (Signed)
Addendum  created 03/14/17 1351 by Willa Frater, CRNA   Intraprocedure Meds edited

## 2017-03-14 NOTE — Transfer of Care (Signed)
Immediate Anesthesia Transfer of Care Note  Patient: [name]  Procedure(s) Performed: SCAPHOID EXCISION ULNAR FOUR BONE FUSION RIGHT (Right Arm Lower)  Patient Location: PACU  Anesthesia Type:General  Level of Consciousness: awake, alert  and drowsy  Airway & Oxygen Therapy: Patient Spontanous Breathing and Patient connected to face mask oxygen  Post-op Assessment: Report given to RN and Post -op Vital signs reviewed and stable  Post vital signs: Reviewed and stable  Last Vitals:  Vitals:   03/14/17 0902 03/14/17 0940  BP:    Pulse: 82   Resp: 12   Temp:    SpO2: 100% 94%    Last Pain:  Vitals:   03/14/17 0842  TempSrc: Oral  PainSc: 5          Complications: No apparent anesthesia complications

## 2017-03-14 NOTE — Op Note (Signed)
[name], [name]               ACCOUNT NO.:  [id number]  MEDICAL RECORD NO.:  [id number]  LOCATION:                                 FACILITY:  PHYSICIAN:  Daryll Brod, M.D.            DATE OF BIRTH: ASSISTANT: K Jahaira Earnhart,M.D. DATE OF PROCEDURE:  03/14/2017 DATE OF DISCHARGE:                              OPERATIVE REPORT   PREOPERATIVE DIAGNOSIS:  Scapholunate advanced collapse, right wrist.  POSTOPERATIVE DIAGNOSIS:  Scapholunate advanced collapse, right wrist.  OPERATION:  Scaphoid excision with posterior interosseous nerve resection for preoperative pain; fusion of capitate, lunate, hamate, triquetrum, right wrist with excision of scaphoid; and radial styloidectomy.  SURGEON:  Daryll Brod, M.D.  ASSISTANT:  Leanora Cover, MD.  ANESTHESIA:  Supraclavicular block, general.  PLACE OF SURGERY:  [facility].  ANESTHESIOLOGIST:  Gifford Shave.  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  He has undergone an ulnar 4-bone fusion on his opposite wrist for the same problem and is desirous to proceeding to have this done on his right side.  He has failed conservative treatment.  Pre, peri, and postoperative course have been discussed along with risks and complications.  He is aware that there is no guarantee to the surgery; the possibility of infection; recurrence of injury to arteries, nerves, tendons; incomplete relief of symptoms; and dystrophy.  In the preoperative area, the patient is seen, the extremity marked by both the patient and surgeon.  Antibiotic given.  DESCRIPTION OF PROCEDURE:  The patient was brought to the operating room after a supraclavicular block was carried out by Dr. Gifford Shave in the preoperative area.  He was given a general anesthetic in the operating room under the direction of the anesthesia department.  He was prepped using ChloraPrep in supine position with the right arm  free.  A 3-minute dry time was allowed, and a time-out taken, confirming the patient and procedure.  The limb was exsanguinated with an Esmarch bandage. Tourniquet placed on the upper arm was inflated to 250 mmHg.  A longitudinal incision was made over the dorsal aspect of the right wrist carried down through subcutaneous tissue.  Bleeders were electrocauterized with bipolar.  The dissection was carried down to the extensor retinaculum, which was split longitudinally.  The third, fourth, and fifth dorsal compartments were opened.  The posterior interosseous nerve was identified proximally, transected, and removed.This was for preoperative pain. The capsular incision was made in a T.  This was carried down to the joint.  Total erosion of the entire scaphoid facet of the distal radius was present along with rotation of the scaphoid and degeneration of the entire proximal aspect of the scaphoid was present.  Scaphoid was then isolated.  This had entirely rotated to approximately 90 degrees.  With sharp and blunt dissection, the scaphoid was removed piecemeal with a rongeur, curettes, and periosteal elevators.  This was removed in toto. This was set aside in particular for graft.  The interval between the lunate and capitate and triquetrum hamate was isolated.  There was total erosion of the proximal capitate.  The subchondral bone was removed with rongeurs and a bur from the each of the 4 bones.  The scaphoid was then morcellized.  The area irrigated.  The bone graft was packed.  The scaphoid was used as entire graft for the entire procedure with no intention of fusing the lunate to the triquetrum.  The lunate was derotated and pinned with a 0.45 K-wire as was the triquetrum and hamate.  X-rays confirmed positioning.  The pin in the lunate was placed as radial as possible so that this would not impact on the articular surface.  It was felt to lie in good position.  The triquetrum was done in  a similar position.  Two 26 mm screws were then selected after measuring each guidepin at 29 mm for screws.  The screws were placed after broaching the cortex proximally.  These were buried under the subchondral bone.  On each side, x-rays taken in the AP and lateral direction revealed that the capitate screw was slightly prominent and this was further placed to the lunate.  X-rays confirmed positioning. There was no impaction of the screws onto the articular surface with flexion and extension and radial and ulnar deviation.  There was an impaction of the trapezium on the radial styloid.  The styloid was then isolated on and a styloidectomy was then performed with rongeurs.  The wound was copiously irrigated with saline.  The capsule was closed with figure-of-eight 2-0 Vicryl sutures, the retinaculum repaired with interrupted 4-0 Vicryl sutures, the subcutaneous tissue with interrupted 4-0 Vicryl, and the skin with interrupted 4-0 nylon sutures.  A sterile compressive dressing and dorsal palmar splint was placed.  Prior to doing this, the wrist was placed through a full range of motion, no crepitation was noted.  The tourniquet was deflated and the patient taken to the recovery room for observation in satisfactory condition. He will be discharged home to return to the [facility] in 1 week.  He has oxycodone to take at home.          ______________________________ Daryll Brod, M.D.     GK/MEDQ  D:  03/14/2017  T:  03/14/2017  Job:  [id number]

## 2017-03-14 NOTE — Anesthesia Procedure Notes (Signed)
Anesthesia Regional Block: Supraclavicular block   Pre-Anesthetic Checklist: ,, timeout performed, Correct Patient, Correct Site, Correct Laterality, Correct Procedure, Correct Position, site marked, Risks and benefits discussed,  Surgical consent,  Pre-op evaluation,  At surgeon's request and post-op pain management  Laterality: Right  Prep: chloraprep       Needles:  Injection technique: Single-shot  Needle Type: Echogenic Needle     Needle Length: 9cm  Needle Gauge: 21     Additional Needles:   Procedures:,,,, ultrasound used (permanent image in chart),,,,  Narrative:  Start time: 03/14/2017 8:53 AM End time: 03/14/2017 8:58 AM Injection made incrementally with aspirations every 5 mL.  Performed by: Personally  Anesthesiologist: Catalina Gravel, MD  Additional Notes: No pain on injection. No increased resistance to injection. Injection made in 5cc increments.  Good needle visualization.  Patient tolerated procedure well.

## 2017-03-14 NOTE — Addendum Note (Signed)
Addendum  created 03/14/17 1311 by Willa Frater, CRNA   Charge Capture section accepted

## 2017-03-14 NOTE — H&P (Signed)
[name] is an 61 y.o. male.   Chief Complaint: right wrist pain [name] is a 61 year old right-hand-dominant male referred by [name] tech for consultation regarding pain in [name] right wrist. This been going on for years. He has a history of rheumatoid arthritis osteoarthritis. He complains of sharp pain in the radial aspect of [name] wrist with a VAS score 8/10 with any use of motion. He has been taking meloxicam and medicine for [name] rheumatoid arthritis. He has had a partial wrist fusion done by [name] years ago, On [name] left wrist. Is very pleased with the result but has not obtained full mobility. He is aware that he would not retain full mobility following the surgery. He states that [name] pain is constant. He is on Plaquenil for [name] rheumatoid arthritis. He has had bilateral carpal tunnel release done in the past. He does not recall who did them. He does have a history of diabetes no history of thyroid problems or gout. Family history is positive diabetes arthritis negative for thyroid problems and gout.      Past Medical History:  Diagnosis Date  . Arthritis    rheumatoid arthritis  . Diabetes mellitus   . Hyperlipidemia   . Hypertension   . Primary localized osteoarthritis of right knee 05/13/2015  . Scapho-lunate dissociation, right   . Vitamin D deficiency     Past Surgical History:  Procedure Laterality Date  . CARDIAC CATHETERIZATION  2005   normal  . CARPAL TUNNEL RELEASE  2009   both hands  . KNEE ARTHROSCOPY     rt  . PARTIAL KNEE ARTHROPLASTY Right 05/13/2015   Procedure: RIGHT UNI KNEE ARTHROPLASTY;  Surgeon: Cameron Bond, MD;  Location: [facility];  Service: Orthopedics;  Laterality: Right;  . scaphoid excision Left 2012   [name]  . TONSILLECTOMY      Family History  Problem Relation Age of Onset  . Diabetes Father   . Hypertension Father   . Heart Problems Father   . Stroke Father    Social History:  reports that he quit smoking  about 26 years ago. he has never used smokeless tobacco. He reports that he drinks alcohol. He reports that he does not use drugs.  Allergies:  Allergies  Allergen Reactions  . Shellfish Allergy Shortness Of Breath and Swelling  . Doxycycline Nausea And Vomiting  . Invokana [Canagliflozin] Other (See Comments)    Causes bladder infections  . Keflex [Cephalexin]     Pt does not tol-nervous    No medications prior to admission.    No results found for this or any previous visit (from the past 48 hour(s)).  No results found.   Pertinent items are noted in HPI.  Height 5\' 10"  (1.778 m), weight 112.5 kg (248 lb).  General appearance: alert, cooperative and appears stated age Head: Normocephalic, without obvious abnormality Neck: no JVD Resp: clear to auscultation bilaterally Cardio: regular rate and rhythm, S1, S2 normal, no murmur, click, rub or gallop GI: soft, non-tender; bowel sounds normal; no masses,  no organomegaly Extremities:  right wrist pain Pulses: 2+ and symmetric Skin: Skin color, texture, turgor normal. No rashes or lesions Neurologic: Grossly normal Incision/Wound: na  Assessment/Plan 1. 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 Primary osteoarthritis of first carpometacarpal joint of right hand  4. Rheumatoid arthritis    Plan: He would like to proceed with a similar fusion on [name] right side.  Pre-peri-postoperative course are discussed along with risks and complications. He is where there is no guarantee to the surgery the possibility of infection recurrence injury to arteries nerves tendons complete relief symptoms dystrophy the possibility loss of mobility.. The possibility of nonunion are discussed. She is are encouraged and answered to [name] and [name] wife's satisfaction. He is scheduled for scaphoid excision ulnar for bone fusion is an outpatient under regional anesthesia. This is to [name] right arm.       [name]  R 03/14/2017, 5:46 AM

## 2017-03-14 NOTE — Progress Notes (Signed)
Assisted Dr. Turk with right, ultrasound guided, supraclavicular block. Side rails up, monitors on throughout procedure. See vital signs in flow sheet. Tolerated Procedure well. 

## 2017-03-14 NOTE — Op Note (Signed)
I assisted Surgeon(s) and Role:    * Daryll Brod, MD - Primary    * Leanora Cover, MD - Assisting on the Procedure(s): [place] on 03/14/2017.  I provided assistance on this case as follows: retraction soft tissues, preparation bone, positioning of hand for hardware placement.  Electronically signed by: Tennis Must, MD Date: 03/14/2017 Time: 11:53 AM

## 2017-03-14 NOTE — Anesthesia Preprocedure Evaluation (Signed)
Anesthesia Evaluation  Patient identified by MRN, date of birth, ID band Patient awake    Reviewed: Allergy & Precautions, NPO status , Patient's Chart, lab work & pertinent test results  Airway Mallampati: II  TM Distance: >3 FB Neck ROM: Full    Dental  (+) Teeth Intact, Dental Advisory Given   Pulmonary former smoker,    Pulmonary exam normal breath sounds clear to auscultation       Cardiovascular hypertension, Pt. on medications Normal cardiovascular exam Rhythm:Regular Rate:Normal  03/28/16 Exercise Stress Test: Nuclear stress EF: 51%. Blood pressure demonstrated a hypertensive response to exercise. There was no ST segment deviation noted during stress. The study is normal. This is a low risk study. The left ventricular ejection fraction is mildly decreased (45-54%).   Neuro/Psych negative neurological ROS  negative psych ROS   GI/Hepatic negative GI ROS, Neg liver ROS,   Endo/Other  diabetes, Type 2, Oral Hypoglycemic AgentsObesity   Renal/GU negative Renal ROS     Musculoskeletal  (+) Arthritis , Rheumatoid disorders,    Abdominal   Peds  Hematology negative hematology ROS (+)   Anesthesia Other Findings Day of surgery medications reviewed with the patient.  Reproductive/Obstetrics                             Anesthesia Physical Anesthesia Plan  ASA: II  Anesthesia Plan: General   Post-op Pain Management:  Regional for Post-op pain   Induction: Intravenous  PONV Risk Score and Plan: 2  Airway Management Planned: LMA  Additional Equipment:   Intra-op Plan:   Post-operative Plan: Extubation in OR  Informed Consent: I have reviewed the patients History and Physical, chart, labs and discussed the procedure including the risks, benefits and alternatives for the proposed anesthesia with the patient or authorized representative who has indicated his/her  understanding and acceptance.   Dental advisory given  Plan Discussed with: CRNA  Anesthesia Plan Comments: (Risks/benefits of general anesthesia discussed with patient including risk of damage to teeth, lips, gum, and tongue, nausea/vomiting, allergic reactions to medications, and the possibility of heart attack, stroke and death.  All patient questions answered.  Patient wishes to proceed.)        Anesthesia Quick Evaluation

## 2017-03-14 NOTE — Brief Op Note (Signed)
03/14/2017  11:53 AM  PATIENT:  [name]  61 y.o. male  PRE-OPERATIVE DIAGNOSIS:  Scapho-lunate Advance Collapse Wrist Right  POST-OPERATIVE DIAGNOSIS:  Scapho-lunate Advance Collapse Wrist Right  PROCEDURE:  Procedure(s): SCAPHOID EXCISION ULNAR FOUR BONE FUSION RIGHT (Right)  SURGEON:  Surgeon(s) and Role:    * Daryll Brod, MD - Primary    * Leanora Cover, MD - Assisting  PHYSICIAN ASSISTANT:   ASSISTANTS: K Alithia Zavaleta,MD    ANESTHESIA:   regional and general  EBL:  2 mL   BLOOD ADMINISTERED:none  DRAINS: none   LOCAL MEDICATIONS USED:  NONE  SPECIMEN:  No Specimen  DISPOSITION OF SPECIMEN:  N/A  COUNTS:  YES  TOURNIQUET:   Total Tourniquet Time Documented: Upper Arm (Right) - 85 minutes Total: Upper Arm (Right) - 85 minutes   DICTATION: .Other Dictation: Dictation Number [id number]  PLAN OF CARE: Discharge to home after PACU  PATIENT DISPOSITION:  PACU - hemodynamically stable.

## 2017-03-14 NOTE — Op Note (Signed)
Dictation Number [id number]

## 2017-03-15 ENCOUNTER — Encounter (HOSPITAL_BASED_OUTPATIENT_CLINIC_OR_DEPARTMENT_OTHER): Payer: Self-pay | Admitting: Orthopedic Surgery

## 2017-03-22 DIAGNOSIS — M25531 Pain in right wrist: Secondary | ICD-10-CM | POA: Diagnosis not present

## 2017-03-22 DIAGNOSIS — M19131 Post-traumatic osteoarthritis, right wrist: Secondary | ICD-10-CM | POA: Diagnosis not present

## 2017-03-29 ENCOUNTER — Other Ambulatory Visit: Payer: Self-pay | Admitting: Cardiovascular Disease

## 2017-04-01 IMAGING — NM NM MISC PROCEDURE
3 series · 18 of 18 positions shown · non-contrast
Comparison: none

[Series 1: wbr_r-proj_st rest_(id)_sa · 6.5mm · 6.51mm/px · 6 of 64 frames shown]
[frame 6/64]
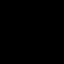
[frame 16/64]
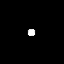
[frame 27/64]
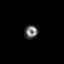
[frame 38/64]
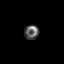
[frame 48/64]
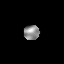
[frame 59/64]
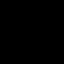

[Series 1: wbr_s-proj_st stress_(id)_sa · 6.5mm · 6.51mm/px · 6 of 64 frames shown (1 of 2)]
[frame 6/64]
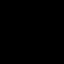
[frame 16/64]
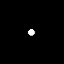
[frame 27/64]
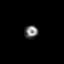
[frame 38/64]
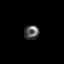
[frame 48/64]
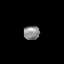
[frame 59/64]
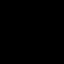

[Series 1: wbr_s-proj_st stress_(id)_sa · 6.5mm · 6.51mm/px · 6 of 512 frames shown (2 of 2)]
[frame 43/512]
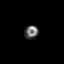
[frame 128/512]
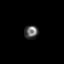
[frame 214/512]
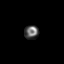
[frame 299/512]
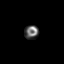
[frame 384/512]
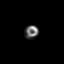
[frame 470/512]
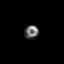

[18 of 18 positions shown; findings below may reference images not displayed]

Canned report from images found in remote index.

Refer to host system for actual result text.

## 2017-04-03 DIAGNOSIS — M0609 Rheumatoid arthritis without rheumatoid factor, multiple sites: Secondary | ICD-10-CM | POA: Diagnosis not present

## 2017-04-12 ENCOUNTER — Other Ambulatory Visit: Payer: Self-pay | Admitting: Family

## 2017-04-14 ENCOUNTER — Other Ambulatory Visit: Payer: Self-pay | Admitting: Cardiovascular Disease

## 2017-04-19 DIAGNOSIS — M19131 Post-traumatic osteoarthritis, right wrist: Secondary | ICD-10-CM | POA: Diagnosis not present

## 2017-05-01 DIAGNOSIS — M0609 Rheumatoid arthritis without rheumatoid factor, multiple sites: Secondary | ICD-10-CM | POA: Diagnosis not present

## 2017-05-07 NOTE — Progress Notes (Signed)
Patient ID: [name], male   DOB: 07-17-1955, 62 y.o.   MRN: [id number]   62 y.o. with CRF;s central obesity, DM and HTN  Quit smoking in 1998 has significant arthritis That limits his activity Had normal myovue 03/28/16 for atypical chest pain   Had right wrist surgery with [name] 03/14/17 with no cardiac issues.   Discussed weight loss and exercise with him Limited by some right hip pain and generalized arthritic issues   ROS: Denies fever, malais, weight loss, blurry vision, decreased visual acuity, cough, sputum, SOB, hemoptysis, pleuritic pain, palpitaitons, heartburn, abdominal pain, melena, lower extremity edema, claudication, or rash.  All other systems reviewed and negative  General: Affect appropriate Obese HEENT: normal Neck supple with no adenopathy JVP normal no bruits no thyromegaly Lungs clear with no wheezing and good diaphragmatic motion Heart:  S1/S2 no murmur, no rub, gallop or click PMI normal Abdomen: benighn, BS positve, no tenderness, no AAA no bruit.  No HSM or HJR Distal pulses intact with no bruits No edema Neuro non-focal Skin warm and dry Right wrist in cast     Current Outpatient Medications  Medication Sig Dispense Refill  . abatacept in sodium chloride 0.9 % Inject into the vein every 30 (thirty) days. Reported on 06/27/2015    . aspirin EC 81 MG tablet Take 81 mg by mouth daily.    . Dulaglutide (TRULICITY) 1.5 XL/2.4MW SOPN Inject 1.5 mg once a week into the skin. 12 pen 3  . hydrochlorothiazide (HYDRODIURIL) 12.5 MG tablet Take 1 tablet (12.5 mg total) by mouth daily. 90 tablet 3  . hydrochlorothiazide (MICROZIDE) 12.5 MG capsule TAKE (1) CAPSULE DAILY 90 capsule 0  . losartan (COZAAR) 100 MG tablet TAKE 1 TABLET BY MOUTH  DAILY 90 tablet 0  . meloxicam (MOBIC) 15 MG tablet Take 15 mg by mouth daily.    . metFORMIN (GLUCOPHAGE) 1000 MG tablet 1000 mg in AM and 1500 mg in PM 225 tablet 1  . oxyCODONE-acetaminophen (PERCOCET) 10-325 MG tablet  Take 1-2 tablets by mouth every 6 (six) hours as needed for pain. MAXIMUM TOTAL ACETAMINOPHEN DOSE IS 4000 MG PER DAY 50 tablet 0  . simvastatin (ZOCOR) 10 MG tablet Take 1 tablet (10 mg total) by mouth daily. Please keep upcoming appt for future refills. Thank you 90 tablet 0  . valACYclovir (VALTREX) 1000 MG tablet Take 1 tablet (1,000 mg total) by mouth 2 (two) times daily. 20 tablet 0  . VITAMIN D, CHOLECALCIFEROL, PO Take by mouth.     No current facility-administered medications for this visit.     Allergies  Other; Shellfish allergy; Doxycycline; Invokana [canagliflozin]; and Keflex [cephalexin]  Electrocardiogram:  SR rate 62 T wave inversion Lead 3  SR rate 66 T inversions 3,F 05/09/17 SR inferior T wave changes No changes from previous   Assessment and Plan HTN: continue ARB and diuretic  NU:[id number] low carb diet.  Target hemoglobin A1c is 6.5 or less.  Continue current medications. Arthritis:continue monthly abatacept with rheum recent right wrist surgery f/u [name]  Chol:  Cholesterol is at goal.  Continue current dose of statin and diet Rx.  No myalgias or side effects.  F/U  LFT's in 6 months. Lab Results  Component Value Date   LDLCALC 63 11/30/2016   Chest Pain :  Atypical abnormal ECG normal myovue 2017 observe          F/u with me in a year    Jenkins Rouge

## 2017-05-09 ENCOUNTER — Ambulatory Visit: Payer: BLUE CROSS/BLUE SHIELD | Admitting: Cardiovascular Disease

## 2017-05-09 ENCOUNTER — Encounter: Payer: Self-pay | Admitting: Cardiovascular Disease

## 2017-05-09 VITALS — BP 162/82 | HR 69 | Ht 70.0 in | Wt 249.5 lb

## 2017-05-09 DIAGNOSIS — I10 Essential (primary) hypertension: Secondary | ICD-10-CM | POA: Diagnosis not present

## 2017-05-09 MED ORDER — SIMVASTATIN 10 MG PO TABS: 10.0000 mg | ORAL_TABLET | Freq: Every day | ORAL | 3 refills | Status: DC

## 2017-05-09 NOTE — Patient Instructions (Addendum)

## 2017-05-09 NOTE — Addendum Note (Signed)
Addended by: Aris Georgia, Camilah Spillman L on: 05/09/2017 09:40 AM   Modules accepted: Orders

## 2017-05-14 DIAGNOSIS — H903 Sensorineural hearing loss, bilateral: Secondary | ICD-10-CM | POA: Diagnosis not present

## 2017-05-14 DIAGNOSIS — H9313 Tinnitus, bilateral: Secondary | ICD-10-CM | POA: Diagnosis not present

## 2017-05-14 DIAGNOSIS — H833X3 Noise effects on inner ear, bilateral: Secondary | ICD-10-CM | POA: Diagnosis not present

## 2017-05-22 DIAGNOSIS — M19131 Post-traumatic osteoarthritis, right wrist: Secondary | ICD-10-CM | POA: Diagnosis not present

## 2017-05-23 ENCOUNTER — Encounter: Payer: Self-pay | Admitting: Family

## 2017-05-23 ENCOUNTER — Ambulatory Visit: Payer: BLUE CROSS/BLUE SHIELD | Admitting: Family

## 2017-05-23 VITALS — BP 124/84 | HR 83 | Temp 98.1°F | Ht 70.0 in | Wt 247.4 lb

## 2017-05-23 DIAGNOSIS — M059 Rheumatoid arthritis with rheumatoid factor, unspecified: Secondary | ICD-10-CM

## 2017-05-23 DIAGNOSIS — E1159 Type 2 diabetes mellitus with other circulatory complications: Secondary | ICD-10-CM | POA: Diagnosis not present

## 2017-05-23 DIAGNOSIS — I10 Essential (primary) hypertension: Secondary | ICD-10-CM | POA: Diagnosis not present

## 2017-05-23 DIAGNOSIS — E1169 Type 2 diabetes mellitus with other specified complication: Secondary | ICD-10-CM

## 2017-05-23 DIAGNOSIS — E785 Hyperlipidemia, unspecified: Secondary | ICD-10-CM | POA: Diagnosis not present

## 2017-05-23 DIAGNOSIS — E1165 Type 2 diabetes mellitus with hyperglycemia: Secondary | ICD-10-CM | POA: Diagnosis not present

## 2017-05-23 DIAGNOSIS — Z1211 Encounter for screening for malignant neoplasm of colon: Secondary | ICD-10-CM | POA: Diagnosis not present

## 2017-05-23 DIAGNOSIS — I152 Hypertension secondary to endocrine disorders: Secondary | ICD-10-CM

## 2017-05-23 LAB — BAYER DCA HB A1C WAIVED: HB A1C (BAYER DCA - WAIVED): 7.6 % — ABNORMAL HIGH (ref <7.0)

## 2017-05-23 NOTE — Patient Instructions (Signed)
Diabetes Mellitus and Nutrition When you have diabetes (diabetes mellitus), it is very important to have healthy eating habits because your blood sugar (glucose) levels are greatly affected by what you eat and drink. Eating healthy foods in the appropriate amounts, at about the same times every day, can help you:  Control your blood glucose.  Lower your risk of heart disease.  Improve your blood pressure.  Reach or maintain a healthy weight.  Every person with diabetes is different, and each person has different needs for a meal plan. Your health care provider may recommend that you work with a diet and nutrition specialist (dietitian) to make a meal plan that is best for you. Your meal plan may vary depending on factors such as:  The calories you need.  The medicines you take.  Your weight.  Your blood glucose, blood pressure, and cholesterol levels.  Your activity level.  Other health conditions you have, such as heart or kidney disease.  How do carbohydrates affect me? Carbohydrates affect your blood glucose level more than any other type of food. Eating carbohydrates naturally increases the amount of glucose in your blood. Carbohydrate counting is a method for keeping track of how many carbohydrates you eat. Counting carbohydrates is important to keep your blood glucose at a healthy level, especially if you use insulin or take certain oral diabetes medicines. It is important to know how many carbohydrates you can safely have in each meal. This is different for every person. Your dietitian can help you calculate how many carbohydrates you should have at each meal and for snack. Foods that contain carbohydrates include:  Bread, cereal, rice, pasta, and crackers.  Potatoes and corn.  Peas, beans, and lentils.  Milk and yogurt.  Fruit and juice.  Desserts, such as cakes, cookies, ice cream, and candy.  How does alcohol affect me? Alcohol can cause a sudden decrease in blood  glucose (hypoglycemia), especially if you use insulin or take certain oral diabetes medicines. Hypoglycemia can be a life-threatening condition. Symptoms of hypoglycemia (sleepiness, dizziness, and confusion) are similar to symptoms of having too much alcohol. If your health care provider says that alcohol is safe for you, follow these guidelines:  Limit alcohol intake to no more than 1 drink per day for nonpregnant women and 2 drinks per day for men. One drink equals 12 oz of beer, 5 oz of wine, or 1 oz of hard liquor.  Do not drink on an empty stomach.  Keep yourself hydrated with water, diet soda, or unsweetened iced tea.  Keep in mind that regular soda, juice, and other mixers may contain a lot of sugar and must be counted as carbohydrates.  What are tips for following this plan? Reading food labels  Start by checking the serving size on the label. The amount of calories, carbohydrates, fats, and other nutrients listed on the label are based on one serving of the food. Many foods contain more than one serving per package.  Check the total grams (g) of carbohydrates in one serving. You can calculate the number of servings of carbohydrates in one serving by dividing the total carbohydrates by 15. For example, if a food has 30 g of total carbohydrates, it would be equal to 2 servings of carbohydrates.  Check the number of grams (g) of saturated and trans fats in one serving. Choose foods that have low or no amount of these fats.  Check the number of milligrams (mg) of sodium in one serving. Most people   should limit total sodium intake to less than 2,300 mg per day.  Always check the nutrition information of foods labeled as "low-fat" or "nonfat". These foods may be higher in added sugar or refined carbohydrates and should be avoided.  Talk to your dietitian to identify your daily goals for nutrients listed on the label. Shopping  Avoid buying canned, premade, or processed foods. These  foods tend to be high in fat, sodium, and added sugar.  Shop around the outside edge of the grocery store. This includes fresh fruits and vegetables, bulk grains, fresh meats, and fresh dairy. Cooking  Use low-heat cooking methods, such as baking, instead of high-heat cooking methods like deep frying.  Cook using healthy oils, such as olive, canola, or sunflower oil.  Avoid cooking with butter, cream, or high-fat meats. Meal planning  Eat meals and snacks regularly, preferably at the same times every day. Avoid going long periods of time without eating.  Eat foods high in fiber, such as fresh fruits, vegetables, beans, and whole grains. Talk to your dietitian about how many servings of carbohydrates you can eat at each meal.  Eat 4-6 ounces of lean protein each day, such as lean meat, chicken, fish, eggs, or tofu. 1 ounce is equal to 1 ounce of meat, chicken, or fish, 1 egg, or 1/4 cup of tofu.  Eat some foods each day that contain healthy fats, such as avocado, nuts, seeds, and fish. Lifestyle   Check your blood glucose regularly.  Exercise at least 30 minutes 5 or more days each week, or as told by your health care provider.  Take medicines as told by your health care provider.  Do not use any products that contain nicotine or tobacco, such as cigarettes and e-cigarettes. If you need help quitting, ask your health care provider.  Work with a counselor or diabetes educator to identify strategies to manage stress and any emotional and social challenges. What are some questions to ask my health care provider?  Do I need to meet with a diabetes educator?  Do I need to meet with a dietitian?  What number can I call if I have questions?  When are the best times to check my blood glucose? Where to find more information:  American Diabetes Association: diabetes.org/food-and-fitness/food  Academy of Nutrition and Dietetics:  www.eatright.org/resources/health/diseases-and-conditions/diabetes  National Institute of Diabetes and Digestive and Kidney Diseases (NIH): www.niddk.nih.gov/health-information/diabetes/overview/diet-eating-physical-activity Summary  A healthy meal plan will help you control your blood glucose and maintain a healthy lifestyle.  Working with a diet and nutrition specialist (dietitian) can help you make a meal plan that is best for you.  Keep in mind that carbohydrates and alcohol have immediate effects on your blood glucose levels. It is important to count carbohydrates and to use alcohol carefully. This information is not intended to replace advice given to you by your health care provider. Make sure you discuss any questions you have with your health care provider. Document Released: 12/21/2004 Document Revised: 04/30/2016 Document Reviewed: 04/30/2016 Elsevier Interactive Patient Education  2018 Elsevier Inc.  

## 2017-05-23 NOTE — Progress Notes (Signed)
Subjective:    Patient ID: [name], male    DOB: 1955/08/12, 62 y.o.   MRN: [id number]  PT presents to the office today for chronic follow up. Pt has rheumatoid arthritis and followed by rheumatologists every 3 months. Pt is followed by Cardiologists annually.  Diabetes  He presents for his follow-up diabetic visit. He has type 2 diabetes mellitus. His disease course has been worsening. There are no hypoglycemic associated symptoms. Associated symptoms include foot paresthesias. Pertinent negatives for diabetes include no blurred vision and no visual change. There are no hypoglycemic complications. Symptoms are worsening. Pertinent negatives for diabetic complications include no CVA or heart disease. Risk factors for coronary artery disease include diabetes mellitus, dyslipidemia, family history, male sex, obesity, hypertension and sedentary lifestyle. His breakfast blood glucose range is generally 140-180 mg/dl. Eye exam is current.  Hypertension  This is a chronic problem. The current episode started more than 1 year ago. The problem has been resolved since onset. The problem is controlled. Pertinent negatives include no blurred vision, peripheral edema or shortness of breath. Risk factors for coronary artery disease include dyslipidemia, diabetes mellitus, obesity and male gender. The current treatment provides moderate improvement. There is no history of CVA.  Hyperlipidemia  This is a chronic problem. The current episode started more than 1 year ago. The problem is controlled. Recent lipid tests were reviewed and are normal. Exacerbating diseases include obesity. Pertinent negatives include no shortness of breath. Current antihyperlipidemic treatment includes statins. The current treatment provides moderate improvement of lipids. Risk factors for coronary artery disease include dyslipidemia, diabetes mellitus, family history, obesity, male sex, hypertension and a sedentary lifestyle.    Arthritis  Presents for follow-up visit. He complains of pain and stiffness. Affected locations include the right MCP and left MCP (back ). His pain is at a severity of 8/10.      Review of Systems  Eyes: Negative for blurred vision.  Respiratory: Negative for shortness of breath.   Musculoskeletal: Positive for arthritis and stiffness.  All other systems reviewed and are negative.      Objective:   Physical Exam  Constitutional: He is oriented to person, place, and time. He appears well-developed and well-nourished. No distress.  HENT:  Head: Normocephalic.  Right Ear: External ear normal.  Left Ear: External ear normal.  Nose: Nose normal.  Mouth/Throat: Oropharynx is clear and moist.  Eyes: Pupils are equal, round, and reactive to light. Right eye exhibits no discharge. Left eye exhibits no discharge.  Neck: Normal range of motion. Neck supple. No thyromegaly present.  Cardiovascular: Normal rate, regular rhythm, normal heart sounds and intact distal pulses.  No murmur heard. Pulmonary/Chest: Effort normal and breath sounds normal. No respiratory distress. He has no wheezes.  Abdominal: Soft. Bowel sounds are normal. He exhibits no distension. There is no tenderness.  Musculoskeletal: Normal range of motion. He exhibits edema (trace swelling in right hand). He exhibits no tenderness.  Neurological: He is alert and oriented to person, place, and time.  Skin: Skin is warm and dry. No rash noted. No erythema.  Psychiatric: He has a normal mood and affect. His behavior is normal. Judgment and thought content normal.  Vitals reviewed.  Diabetic Foot Exam - Simple   Simple Foot Form Diabetic Foot exam was performed with the following findings:  Yes 05/23/2017  8:43 AM  Visual Inspection No deformities, no ulcerations, no other skin breakdown bilaterally:  Yes Sensation Testing Intact to touch and monofilament testing bilaterally:  Yes Pulse Check Posterior Tibialis and  Dorsalis pulse intact bilaterally:  Yes Comments      BP 124/84   Pulse 83   Temp 98.1 F (36.7 C) (Oral)   Ht 5' 10"  (1.778 m)   Wt 247 lb 6.4 oz (112.2 kg)   BMI 35.50 kg/m      Assessment & Plan:  1. Hypertension associated with diabetes ([place]) - CMP14+EGFR  2. Type 2 diabetes mellitus with hyperglycemia, without long-term current use of insulin ([facility]) - Bayer DCA Hb A1c Waived - CMP14+EGFR  3. Hyperlipidemia associated with type 2 diabetes mellitus ([facility]) - CMP14+EGFR - Lipid panel  4. Rheumatoid arthritis with positive rheumatoid factor, involving unspecified site ([place]) - CMP14+EGFR  5. Morbid obesity ([place]) - CMP14+EGFR  6. Colon cancer screening - CMP14+EGFR - Fecal occult blood, imunochemical; Future   Continue all meds Labs pending Health Maintenance reviewed Diet and exercise encouraged RTO 3 month   Evelina Dun, FNP

## 2017-05-24 ENCOUNTER — Other Ambulatory Visit: Payer: Self-pay | Admitting: Family

## 2017-05-24 LAB — CMP14+EGFR
ALK PHOS: 70 IU/L (ref 39–117)
ALT: 20 IU/L (ref 0–44)
AST: 19 IU/L (ref 0–40)
Albumin/Globulin Ratio: 1.6 (ref 1.2–2.2)
Albumin: 4.3 g/dL (ref 3.6–4.8)
BUN/Creatinine Ratio: 12 (ref 10–24)
BUN: 15 mg/dL (ref 8–27)
Bilirubin Total: 0.3 mg/dL (ref 0.0–1.2)
CALCIUM: 9.5 mg/dL (ref 8.6–10.2)
CO2: 20 mmol/L (ref 20–29)
CREATININE: 1.23 mg/dL (ref 0.76–1.27)
Chloride: 100 mmol/L (ref 96–106)
GFR calc Af Amer: 72 mL/min/{1.73_m2} (ref >59)
GFR, EST NON AFRICAN AMERICAN: 62 mL/min/{1.73_m2} (ref >59)
Globulin, Total: 2.7 g/dL (ref 1.5–4.5)
Glucose: 144 mg/dL — ABNORMAL HIGH (ref 65–99)
Potassium: 4.3 mmol/L (ref 3.5–5.2)
Sodium: 139 mmol/L (ref 134–144)
Total Protein: 7 g/dL (ref 6.0–8.5)

## 2017-05-24 LAB — LIPID PANEL
CHOL/HDL RATIO: 3.5 ratio (ref 0.0–5.0)
CHOLESTEROL TOTAL: 113 mg/dL (ref 100–199)
HDL: 32 mg/dL — ABNORMAL LOW (ref >39)
LDL CALC: 64 mg/dL (ref 0–99)
TRIGLYCERIDES: 84 mg/dL (ref 0–149)
VLDL CHOLESTEROL CAL: 17 mg/dL (ref 5–40)

## 2017-05-24 MED ORDER — DULAGLUTIDE 1.5 MG/0.5ML ~~LOC~~ SOAJ: 1.5000 mg | SUBCUTANEOUS | 2 refills | Status: DC

## 2017-05-29 DIAGNOSIS — M0609 Rheumatoid arthritis without rheumatoid factor, multiple sites: Secondary | ICD-10-CM | POA: Diagnosis not present

## 2017-05-30 DIAGNOSIS — Z981 Arthrodesis status: Secondary | ICD-10-CM | POA: Diagnosis not present

## 2017-05-30 DIAGNOSIS — M19131 Post-traumatic osteoarthritis, right wrist: Secondary | ICD-10-CM | POA: Diagnosis not present

## 2017-05-30 DIAGNOSIS — M19031 Primary osteoarthritis, right wrist: Secondary | ICD-10-CM | POA: Diagnosis not present

## 2017-06-06 ENCOUNTER — Ambulatory Visit: Payer: BLUE CROSS/BLUE SHIELD | Admitting: Family

## 2017-06-07 ENCOUNTER — Telehealth: Payer: Self-pay | Admitting: Family

## 2017-06-07 NOTE — Telephone Encounter (Signed)
faxed

## 2017-06-18 ENCOUNTER — Other Ambulatory Visit: Payer: Self-pay | Admitting: Cardiovascular Disease

## 2017-06-20 DIAGNOSIS — E1165 Type 2 diabetes mellitus with hyperglycemia: Secondary | ICD-10-CM | POA: Diagnosis not present

## 2017-06-20 DIAGNOSIS — E7800 Pure hypercholesterolemia, unspecified: Secondary | ICD-10-CM | POA: Diagnosis not present

## 2017-06-20 DIAGNOSIS — I10 Essential (primary) hypertension: Secondary | ICD-10-CM | POA: Diagnosis not present

## 2017-06-21 ENCOUNTER — Other Ambulatory Visit: Payer: Self-pay | Admitting: Cardiovascular Disease

## 2017-06-24 DIAGNOSIS — M150 Primary generalized (osteo)arthritis: Secondary | ICD-10-CM | POA: Diagnosis not present

## 2017-06-24 DIAGNOSIS — Z79899 Other long term (current) drug therapy: Secondary | ICD-10-CM | POA: Diagnosis not present

## 2017-06-24 DIAGNOSIS — M2550 Pain in unspecified joint: Secondary | ICD-10-CM | POA: Diagnosis not present

## 2017-06-24 DIAGNOSIS — M0609 Rheumatoid arthritis without rheumatoid factor, multiple sites: Secondary | ICD-10-CM | POA: Diagnosis not present

## 2017-06-25 DIAGNOSIS — M25511 Pain in right shoulder: Secondary | ICD-10-CM | POA: Diagnosis not present

## 2017-06-26 DIAGNOSIS — M0609 Rheumatoid arthritis without rheumatoid factor, multiple sites: Secondary | ICD-10-CM | POA: Diagnosis not present

## 2017-06-26 DIAGNOSIS — Z79899 Other long term (current) drug therapy: Secondary | ICD-10-CM | POA: Diagnosis not present

## 2017-06-26 DIAGNOSIS — M19131 Post-traumatic osteoarthritis, right wrist: Secondary | ICD-10-CM | POA: Diagnosis not present

## 2017-07-11 DIAGNOSIS — S62122K Displaced fracture of lunate [semilunar], left wrist, subsequent encounter for fracture with nonunion: Secondary | ICD-10-CM | POA: Diagnosis not present

## 2017-07-23 DIAGNOSIS — L821 Other seborrheic keratosis: Secondary | ICD-10-CM | POA: Diagnosis not present

## 2017-07-23 DIAGNOSIS — D2270 Melanocytic nevi of unspecified lower limb, including hip: Secondary | ICD-10-CM | POA: Diagnosis not present

## 2017-07-23 DIAGNOSIS — L219 Seborrheic dermatitis, unspecified: Secondary | ICD-10-CM | POA: Diagnosis not present

## 2017-07-23 DIAGNOSIS — D1801 Hemangioma of skin and subcutaneous tissue: Secondary | ICD-10-CM | POA: Diagnosis not present

## 2017-07-24 DIAGNOSIS — D509 Iron deficiency anemia, unspecified: Secondary | ICD-10-CM | POA: Diagnosis not present

## 2017-07-24 DIAGNOSIS — M0609 Rheumatoid arthritis without rheumatoid factor, multiple sites: Secondary | ICD-10-CM | POA: Diagnosis not present

## 2017-07-24 DIAGNOSIS — S62122K Displaced fracture of lunate [semilunar], left wrist, subsequent encounter for fracture with nonunion: Secondary | ICD-10-CM | POA: Diagnosis not present

## 2017-08-16 ENCOUNTER — Other Ambulatory Visit: Payer: Self-pay | Admitting: Family

## 2017-08-16 DIAGNOSIS — J069 Acute upper respiratory infection, unspecified: Secondary | ICD-10-CM

## 2017-08-20 ENCOUNTER — Encounter: Payer: Self-pay | Admitting: Family

## 2017-08-20 ENCOUNTER — Ambulatory Visit: Payer: BLUE CROSS/BLUE SHIELD | Admitting: Family

## 2017-08-20 VITALS — BP 119/79 | HR 77 | Temp 97.4°F | Ht 70.0 in | Wt 250.2 lb

## 2017-08-20 DIAGNOSIS — E1159 Type 2 diabetes mellitus with other circulatory complications: Secondary | ICD-10-CM | POA: Diagnosis not present

## 2017-08-20 DIAGNOSIS — I152 Hypertension secondary to endocrine disorders: Secondary | ICD-10-CM

## 2017-08-20 DIAGNOSIS — Z125 Encounter for screening for malignant neoplasm of prostate: Secondary | ICD-10-CM

## 2017-08-20 DIAGNOSIS — M1711 Unilateral primary osteoarthritis, right knee: Secondary | ICD-10-CM | POA: Diagnosis not present

## 2017-08-20 DIAGNOSIS — E1169 Type 2 diabetes mellitus with other specified complication: Secondary | ICD-10-CM

## 2017-08-20 DIAGNOSIS — E1165 Type 2 diabetes mellitus with hyperglycemia: Secondary | ICD-10-CM

## 2017-08-20 DIAGNOSIS — E785 Hyperlipidemia, unspecified: Secondary | ICD-10-CM | POA: Diagnosis not present

## 2017-08-20 DIAGNOSIS — I10 Essential (primary) hypertension: Secondary | ICD-10-CM

## 2017-08-20 DIAGNOSIS — M059 Rheumatoid arthritis with rheumatoid factor, unspecified: Secondary | ICD-10-CM | POA: Diagnosis not present

## 2017-08-20 LAB — BAYER DCA HB A1C WAIVED: HB A1C (BAYER DCA - WAIVED): 7.3 % — ABNORMAL HIGH (ref <7.0)

## 2017-08-20 NOTE — Patient Instructions (Signed)
Diabetes Mellitus and Nutrition When you have diabetes (diabetes mellitus), it is very important to have healthy eating habits because your blood sugar (glucose) levels are greatly affected by what you eat and drink. Eating healthy foods in the appropriate amounts, at about the same times every day, can help you:  Control your blood glucose.  Lower your risk of heart disease.  Improve your blood pressure.  Reach or maintain a healthy weight.  Every person with diabetes is different, and each person has different needs for a meal plan. Your health care provider may recommend that you work with a diet and nutrition specialist (dietitian) to make a meal plan that is best for you. Your meal plan may vary depending on factors such as:  The calories you need.  The medicines you take.  Your weight.  Your blood glucose, blood pressure, and cholesterol levels.  Your activity level.  Other health conditions you have, such as heart or kidney disease.  How do carbohydrates affect me? Carbohydrates affect your blood glucose level more than any other type of food. Eating carbohydrates naturally increases the amount of glucose in your blood. Carbohydrate counting is a method for keeping track of how many carbohydrates you eat. Counting carbohydrates is important to keep your blood glucose at a healthy level, especially if you use insulin or take certain oral diabetes medicines. It is important to know how many carbohydrates you can safely have in each meal. This is different for every person. Your dietitian can help you calculate how many carbohydrates you should have at each meal and for snack. Foods that contain carbohydrates include:  Bread, cereal, rice, pasta, and crackers.  Potatoes and corn.  Peas, beans, and lentils.  Milk and yogurt.  Fruit and juice.  Desserts, such as cakes, cookies, ice cream, and candy.  How does alcohol affect me? Alcohol can cause a sudden decrease in blood  glucose (hypoglycemia), especially if you use insulin or take certain oral diabetes medicines. Hypoglycemia can be a life-threatening condition. Symptoms of hypoglycemia (sleepiness, dizziness, and confusion) are similar to symptoms of having too much alcohol. If your health care provider says that alcohol is safe for you, follow these guidelines:  Limit alcohol intake to no more than 1 drink per day for nonpregnant women and 2 drinks per day for men. One drink equals 12 oz of beer, 5 oz of wine, or 1 oz of hard liquor.  Do not drink on an empty stomach.  Keep yourself hydrated with water, diet soda, or unsweetened iced tea.  Keep in mind that regular soda, juice, and other mixers may contain a lot of sugar and must be counted as carbohydrates.  What are tips for following this plan? Reading food labels  Start by checking the serving size on the label. The amount of calories, carbohydrates, fats, and other nutrients listed on the label are based on one serving of the food. Many foods contain more than one serving per package.  Check the total grams (g) of carbohydrates in one serving. You can calculate the number of servings of carbohydrates in one serving by dividing the total carbohydrates by 15. For example, if a food has 30 g of total carbohydrates, it would be equal to 2 servings of carbohydrates.  Check the number of grams (g) of saturated and trans fats in one serving. Choose foods that have low or no amount of these fats.  Check the number of milligrams (mg) of sodium in one serving. Most people   should limit total sodium intake to less than 2,300 mg per day.  Always check the nutrition information of foods labeled as "low-fat" or "nonfat". These foods may be higher in added sugar or refined carbohydrates and should be avoided.  Talk to your dietitian to identify your daily goals for nutrients listed on the label. Shopping  Avoid buying canned, premade, or processed foods. These  foods tend to be high in fat, sodium, and added sugar.  Shop around the outside edge of the grocery store. This includes fresh fruits and vegetables, bulk grains, fresh meats, and fresh dairy. Cooking  Use low-heat cooking methods, such as baking, instead of high-heat cooking methods like deep frying.  Cook using healthy oils, such as olive, canola, or sunflower oil.  Avoid cooking with butter, cream, or high-fat meats. Meal planning  Eat meals and snacks regularly, preferably at the same times every day. Avoid going long periods of time without eating.  Eat foods high in fiber, such as fresh fruits, vegetables, beans, and whole grains. Talk to your dietitian about how many servings of carbohydrates you can eat at each meal.  Eat 4-6 ounces of lean protein each day, such as lean meat, chicken, fish, eggs, or tofu. 1 ounce is equal to 1 ounce of meat, chicken, or fish, 1 egg, or 1/4 cup of tofu.  Eat some foods each day that contain healthy fats, such as avocado, nuts, seeds, and fish. Lifestyle   Check your blood glucose regularly.  Exercise at least 30 minutes 5 or more days each week, or as told by your health care provider.  Take medicines as told by your health care provider.  Do not use any products that contain nicotine or tobacco, such as cigarettes and e-cigarettes. If you need help quitting, ask your health care provider.  Work with a counselor or diabetes educator to identify strategies to manage stress and any emotional and social challenges. What are some questions to ask my health care provider?  Do I need to meet with a diabetes educator?  Do I need to meet with a dietitian?  What number can I call if I have questions?  When are the best times to check my blood glucose? Where to find more information:  American Diabetes Association: diabetes.org/food-and-fitness/food  Academy of Nutrition and Dietetics:  www.eatright.org/resources/health/diseases-and-conditions/diabetes  National Institute of Diabetes and Digestive and Kidney Diseases (NIH): www.niddk.nih.gov/health-information/diabetes/overview/diet-eating-physical-activity Summary  A healthy meal plan will help you control your blood glucose and maintain a healthy lifestyle.  Working with a diet and nutrition specialist (dietitian) can help you make a meal plan that is best for you.  Keep in mind that carbohydrates and alcohol have immediate effects on your blood glucose levels. It is important to count carbohydrates and to use alcohol carefully. This information is not intended to replace advice given to you by your health care provider. Make sure you discuss any questions you have with your health care provider. Document Released: 12/21/2004 Document Revised: 04/30/2016 Document Reviewed: 04/30/2016 Elsevier Interactive Patient Education  2018 Elsevier Inc.  

## 2017-08-20 NOTE — Progress Notes (Signed)
Subjective:    Patient ID: [name], male    DOB: [birth year], 62 y.o.   MRN: [id number]  Chief Complaint  Patient presents with  . Diabetes    three months recheck   PT presents to the office today for chronic follow up. Pt has rheumatoid arthritis and followed by rheumatologists every 3 months.Pt is followed by Cardiologists annually.   Diabetes  He presents for his follow-up diabetic visit. He has type 2 diabetes mellitus. There are no hypoglycemic associated symptoms. Pertinent negatives for diabetes include no blurred vision, no foot paresthesias and no visual change. There are no hypoglycemic complications. Symptoms are stable. Pertinent negatives for diabetic complications include no CVA or heart disease. Risk factors for coronary artery disease include dyslipidemia, diabetes mellitus, male sex, obesity, hypertension and family history. His overall blood glucose range is 130-140 mg/dl. Eye exam is not current.  Hyperlipidemia  This is a chronic problem. The current episode started more than 1 year ago. The problem is controlled. Recent lipid tests were reviewed and are normal. Exacerbating diseases include obesity. Pertinent negatives include no shortness of breath. Current antihyperlipidemic treatment includes statins. The current treatment provides moderate improvement of lipids. Risk factors for coronary artery disease include diabetes mellitus, dyslipidemia, obesity, hypertension and a sedentary lifestyle.  Hypertension  This is a chronic problem. The current episode started more than 1 year ago. The problem has been resolved since onset. The problem is controlled. Associated symptoms include malaise/fatigue. Pertinent negatives include no blurred vision, peripheral edema or shortness of breath. Risk factors for coronary artery disease include dyslipidemia, diabetes mellitus, family history, obesity and male gender. There is no history of CVA.  Arthritis  Presents for follow-up  visit. Affected locations include the right knee and left knee (back). His pain is at a severity of 10/10.      Review of Systems  Constitutional: Positive for malaise/fatigue.  Eyes: Negative for blurred vision.  Respiratory: Negative for shortness of breath.   Musculoskeletal: Positive for arthralgias and arthritis.  All other systems reviewed and are negative.      Objective:   Physical Exam  Constitutional: He is oriented to person, place, and time. He appears well-developed and well-nourished. No distress.  HENT:  Head: Normocephalic.  Right Ear: External ear normal.  Left Ear: External ear normal.  Mouth/Throat: Oropharynx is clear and moist.  Eyes: Pupils are equal, round, and reactive to light. Right eye exhibits no discharge. Left eye exhibits no discharge.  Neck: Normal range of motion. Neck supple. No thyromegaly present.  Cardiovascular: Normal rate, regular rhythm, normal heart sounds and intact distal pulses.  No murmur heard. Pulmonary/Chest: Effort normal and breath sounds normal. No respiratory distress. He has no wheezes.  Abdominal: Soft. Bowel sounds are normal. He exhibits no distension. There is no tenderness.  Musculoskeletal: Normal range of motion. He exhibits no edema or tenderness.  Neurological: He is alert and oriented to person, place, and time. He has normal reflexes. No cranial nerve deficit.  Skin: Skin is warm and dry. No rash noted. No erythema.  Psychiatric: He has a normal mood and affect. His behavior is normal. Judgment and thought content normal.  Vitals reviewed.     BP 119/79   Pulse 77   Temp (!) 97.4 F (36.3 C) (Oral)   Ht 5' 10"  (1.778 m)   Wt 250 lb 3.2 oz (113.5 kg)   BMI 35.90 kg/m      Assessment & Plan:  Cameron Pretty  Mclean comes in today with chief complaint of Diabetes (three months recheck)   Diagnosis and orders addressed:  1. Hypertension associated with diabetes ([place]) - CMP14+EGFR  2. Type 2 diabetes mellitus  with hyperglycemia, without long-term current use of insulin ([facility]) - Bayer DCA Hb A1c Waived - CMP14+EGFR  3. Rheumatoid arthritis with positive rheumatoid factor, involving unspecified site ([place]) - CMP14+EGFR  4. Primary localized osteoarthritis of right knee - CMP14+EGFR  5. Morbid obesity ([place]) - CMP14+EGFR  6. Hyperlipidemia associated with type 2 diabetes mellitus ([place]) - CMP14+EGFR  7. Prostate cancer screening - CMP14+EGFR - PSA, total and free   Labs pending Health Maintenance reviewed Diet and exercise encouraged  Follow up plan: 4 months   Evelina Dun, FNP

## 2017-08-21 DIAGNOSIS — M0609 Rheumatoid arthritis without rheumatoid factor, multiple sites: Secondary | ICD-10-CM | POA: Diagnosis not present

## 2017-08-21 LAB — CMP14+EGFR
ALBUMIN: 4.3 g/dL (ref 3.6–4.8)
ALK PHOS: 66 IU/L (ref 39–117)
ALT: 21 IU/L (ref 0–44)
AST: 23 IU/L (ref 0–40)
Albumin/Globulin Ratio: 1.7 (ref 1.2–2.2)
BILIRUBIN TOTAL: 0.3 mg/dL (ref 0.0–1.2)
BUN / CREAT RATIO: 15 (ref 10–24)
BUN: 17 mg/dL (ref 8–27)
CO2: 23 mmol/L (ref 20–29)
Calcium: 9.6 mg/dL (ref 8.6–10.2)
Chloride: 100 mmol/L (ref 96–106)
Creatinine, Ser: 1.15 mg/dL (ref 0.76–1.27)
GFR calc non Af Amer: 67 mL/min/{1.73_m2} (ref >59)
GFR, EST AFRICAN AMERICAN: 77 mL/min/{1.73_m2} (ref >59)
GLUCOSE: 124 mg/dL — AB (ref 65–99)
Globulin, Total: 2.5 g/dL (ref 1.5–4.5)
POTASSIUM: 4.9 mmol/L (ref 3.5–5.2)
SODIUM: 139 mmol/L (ref 134–144)
Total Protein: 6.8 g/dL (ref 6.0–8.5)

## 2017-08-21 LAB — PSA, TOTAL AND FREE
PSA, Free Pct: 25 %
PSA, Free: 0.15 ng/mL
Prostate Specific Ag, Serum: 0.6 ng/mL (ref 0.0–4.0)

## 2017-09-04 DIAGNOSIS — M545 Low back pain: Secondary | ICD-10-CM | POA: Diagnosis not present

## 2017-09-04 DIAGNOSIS — M47816 Spondylosis without myelopathy or radiculopathy, lumbar region: Secondary | ICD-10-CM | POA: Diagnosis not present

## 2017-09-04 DIAGNOSIS — M546 Pain in thoracic spine: Secondary | ICD-10-CM | POA: Diagnosis not present

## 2017-09-05 DIAGNOSIS — M545 Low back pain: Secondary | ICD-10-CM | POA: Diagnosis not present

## 2017-09-05 DIAGNOSIS — M546 Pain in thoracic spine: Secondary | ICD-10-CM | POA: Diagnosis not present

## 2017-09-05 DIAGNOSIS — M47816 Spondylosis without myelopathy or radiculopathy, lumbar region: Secondary | ICD-10-CM | POA: Diagnosis not present

## 2017-09-06 DIAGNOSIS — M19131 Post-traumatic osteoarthritis, right wrist: Secondary | ICD-10-CM | POA: Diagnosis not present

## 2017-09-12 DIAGNOSIS — M47816 Spondylosis without myelopathy or radiculopathy, lumbar region: Secondary | ICD-10-CM | POA: Diagnosis not present

## 2017-09-12 DIAGNOSIS — M546 Pain in thoracic spine: Secondary | ICD-10-CM | POA: Diagnosis not present

## 2017-09-12 DIAGNOSIS — M545 Low back pain: Secondary | ICD-10-CM | POA: Diagnosis not present

## 2017-09-13 ENCOUNTER — Other Ambulatory Visit: Payer: Self-pay | Admitting: Family

## 2017-09-13 DIAGNOSIS — J069 Acute upper respiratory infection, unspecified: Secondary | ICD-10-CM

## 2017-09-17 DIAGNOSIS — M546 Pain in thoracic spine: Secondary | ICD-10-CM | POA: Diagnosis not present

## 2017-09-17 DIAGNOSIS — M47816 Spondylosis without myelopathy or radiculopathy, lumbar region: Secondary | ICD-10-CM | POA: Diagnosis not present

## 2017-09-17 DIAGNOSIS — M545 Low back pain: Secondary | ICD-10-CM | POA: Diagnosis not present

## 2017-09-18 DIAGNOSIS — M0609 Rheumatoid arthritis without rheumatoid factor, multiple sites: Secondary | ICD-10-CM | POA: Diagnosis not present

## 2017-10-01 DIAGNOSIS — M545 Low back pain: Secondary | ICD-10-CM | POA: Diagnosis not present

## 2017-10-01 DIAGNOSIS — M47816 Spondylosis without myelopathy or radiculopathy, lumbar region: Secondary | ICD-10-CM | POA: Diagnosis not present

## 2017-10-01 DIAGNOSIS — M546 Pain in thoracic spine: Secondary | ICD-10-CM | POA: Diagnosis not present

## 2017-10-11 ENCOUNTER — Other Ambulatory Visit: Payer: Self-pay | Admitting: Orthopedic Surgery

## 2017-10-11 DIAGNOSIS — M19131 Post-traumatic osteoarthritis, right wrist: Secondary | ICD-10-CM | POA: Diagnosis not present

## 2017-10-16 ENCOUNTER — Telehealth: Payer: Self-pay | Admitting: *Deleted

## 2017-10-16 ENCOUNTER — Other Ambulatory Visit: Payer: Self-pay | Admitting: Family

## 2017-10-16 DIAGNOSIS — M0609 Rheumatoid arthritis without rheumatoid factor, multiple sites: Secondary | ICD-10-CM | POA: Diagnosis not present

## 2017-10-16 DIAGNOSIS — J069 Acute upper respiratory infection, unspecified: Secondary | ICD-10-CM

## 2017-10-16 NOTE — Telephone Encounter (Signed)
   [facility] Pre-operative Risk Assessment    Request for surgical clearance:  1. What type of surgery is being performed? Removal Accutrac Screws of Right Wrist.   2. When is this surgery scheduled? 11/14/2017  3. What type of clearance is required (medical clearance vs. Pharmacy clearance to hold med vs. Both)? Both  4. Are there any medications that need to be held prior to surgery and how long? Aspirin 81 mg  5. Practice name and name of physician performing surgery? [facility], Dr. Daryll Brod  6. What is your office phone number [phone]   7.   What is your office fax number [phone], Attn Cyndee Brightly, RN  8.   Anesthesia type (None, local, MAC, general) ? Axillary Block   Evan Mackie 10/16/2017, 5:44 PM  _________________________________________________________________   (provider comments below)

## 2017-10-17 NOTE — Telephone Encounter (Signed)
   Primary Cardiologist: Jenkins Rouge, MD  Chart reviewed as part of pre-operative protocol coverage. Patient was contacted 10/17/2017 in reference to pre-operative risk assessment for pending surgery as outlined below.  ELIZAH MIERZWA was last seen on 05/09/17 by Dr. Johnsie Cancel.  Since that day, ROMEL DUMOND has done well with no new complaints. He has hx of hypertension and diabetes, no hx of MI or cardiac stent. His aspirin can be held as needed for the procedure.   Therefore, based on ACC/AHA guidelines, the patient would be at acceptable risk for the planned procedure without further cardiovascular testing.   I will route this recommendation to the requesting party via Epic fax function and remove from pre-op pool.  Please call with questions.  Daune Perch, NP 10/17/2017, 2:13 PM

## 2017-10-24 DIAGNOSIS — E1165 Type 2 diabetes mellitus with hyperglycemia: Secondary | ICD-10-CM | POA: Diagnosis not present

## 2017-10-24 DIAGNOSIS — I10 Essential (primary) hypertension: Secondary | ICD-10-CM | POA: Diagnosis not present

## 2017-10-24 DIAGNOSIS — E7800 Pure hypercholesterolemia, unspecified: Secondary | ICD-10-CM | POA: Diagnosis not present

## 2017-10-29 DIAGNOSIS — M47816 Spondylosis without myelopathy or radiculopathy, lumbar region: Secondary | ICD-10-CM | POA: Diagnosis not present

## 2017-10-29 DIAGNOSIS — M545 Low back pain: Secondary | ICD-10-CM | POA: Diagnosis not present

## 2017-10-29 DIAGNOSIS — M546 Pain in thoracic spine: Secondary | ICD-10-CM | POA: Diagnosis not present

## 2017-10-31 ENCOUNTER — Ambulatory Visit: Payer: BLUE CROSS/BLUE SHIELD | Admitting: Family Medicine

## 2017-10-31 ENCOUNTER — Encounter: Payer: Self-pay | Admitting: Family Medicine

## 2017-10-31 VITALS — BP 133/77 | HR 80 | Temp 97.6°F | Ht 70.0 in | Wt 249.0 lb

## 2017-10-31 DIAGNOSIS — G5711 Meralgia paresthetica, right lower limb: Secondary | ICD-10-CM | POA: Diagnosis not present

## 2017-10-31 NOTE — Patient Instructions (Signed)
I think what you are experiencing is something called meralgia paraethesthica (or entrapment of the cutaneous nerve of your hip/ thigh).  We discussed ways to improve this.  I also provided you a separate handout.  If symptoms become worse, I want you to follow-up with [facility] or your orthopedist.  They may be able to offer you medication or a nerve block with the orthopedist to help with this.  There was no evidence of inguinal hernia on today's exam.

## 2017-10-31 NOTE — Progress Notes (Signed)
Subjective: CC: burning hip PCP: Sharion Balloon, FNP OIZ:[id number] [name] is a 62 y.o. male presenting to clinic today for:  1. Burning hip Patient reports a several day history of a burning sensation that starts along the anterior hip in radiates to the mons pubis and thigh on the right side.  He states that symptoms started after traveling in a car for a while.  Symptoms are intermittent and seem to be exacerbated by prolonged sitting.  He states that symptoms lasts less than a couple of minutes and then resolve independent of any treatments or change in position.  No associated bulges, scrotal swelling or pain.  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  He sees a Restaurant manager, fast food and orthopedist.  Past medical history significant for rheumatoid arthritis and psoriasis.  He is currently on meloxicam and pain medication for these issues.   ROS: Per HPI  Allergies  Allergen Reactions  . Other Shortness Of Breath, Swelling and Hives  . Shellfish Allergy Shortness Of Breath and Swelling  . Doxycycline Nausea And Vomiting  . Invokana [Canagliflozin] Other (See Comments)    Causes bladder infections  . Keflex [Cephalexin]     Pt does not tol-nervous   Past Medical History:  Diagnosis Date  . Arthritis    rheumatoid arthritis  . Diabetes mellitus   . Hyperlipidemia   . Hypertension   . Primary localized osteoarthritis of right knee 05/13/2015  . Scapho-lunate dissociation, right   . Vitamin D deficiency     Current Outpatient Medications:  .  abatacept in sodium chloride 0.9 %, Inject into the vein every 30 (thirty) days. Reported on 06/27/2015, Disp: , Rfl:  .  aspirin EC 81 MG tablet, Take 81 mg by mouth daily., Disp: , Rfl:  .  glimepiride (AMARYL) 2 MG tablet, , Disp: , Rfl:  .  hydrochlorothiazide (HYDRODIURIL) 12.5 MG tablet, Take 1 tablet (12.5 mg total) by mouth daily., Disp: 90 tablet, Rfl: 3 .  hydrochlorothiazide (MICROZIDE) 12.5 MG capsule, TAKE (1)  CAPSULE DAILY, Disp: 90 capsule, Rfl: 0 .  losartan (COZAAR) 100 MG tablet, TAKE 1 TABLET BY MOUTH  DAILY, Disp: 90 tablet, Rfl: 3 .  meloxicam (MOBIC) 15 MG tablet, Take 15 mg by mouth daily., Disp: , Rfl:  .  metFORMIN (GLUCOPHAGE) 1000 MG tablet, TAKE 1 TABLET IN THE MORNING AND 1&1/2 TABLETS IN THE EVENING, Disp: 75 tablet, Rfl: 0 .  oxyCODONE-acetaminophen (PERCOCET) 10-325 MG tablet, Take 1-2 tablets by mouth every 6 (six) hours as needed for pain. MAXIMUM TOTAL ACETAMINOPHEN DOSE IS 4000 MG PER DAY, Disp: 50 tablet, Rfl: 0 .  simvastatin (ZOCOR) 10 MG tablet, TAKE 1 TABLET BY MOUTH  DAILY. PLEASE KEEP UPCOMING APPT FOR FUTURE REFILLS.  THANK YOU, Disp: 90 tablet, Rfl: 3 .  valACYclovir (VALTREX) 1000 MG tablet, Take 1 tablet (1,000 mg total) by mouth 2 (two) times daily., Disp: 20 tablet, Rfl: 0 .  VITAMIN D, CHOLECALCIFEROL, PO, Take by mouth., Disp: , Rfl:  Social History   Socioeconomic History  . Marital status: Married    Spouse name: Not on file  . Number of children: Not on file  . Years of education: Not on file  . Highest education level: Not on file  Occupational History  . Not on file  Social Needs  . Financial resource strain: Not on file  . Food insecurity:    Worry: Not on file    Inability: Not on file  .  Transportation needs:    Medical: Not on file    Non-medical: Not on file  Tobacco Use  . Smoking status: Former Smoker    Last attempt to quit: 03/05/1991    Years since quitting: 26.6  . Smokeless tobacco: Never Used  Substance and Sexual Activity  . Alcohol use: Yes    Comment: rare  . Drug use: No  . Sexual activity: Not on file  Lifestyle  . Physical activity:    Days per week: Not on file    Minutes per session: Not on file  . Stress: Not on file  Relationships  . Social connections:    Talks on phone: Not on file    Gets together: Not on file    Attends religious service: Not on file    Active member of club or organization: Not on file     Attends meetings of clubs or organizations: Not on file    Relationship status: Not on file  . Intimate partner violence:    Fear of current or ex partner: Not on file    Emotionally abused: Not on file    Physically abused: Not on file    Forced sexual activity: Not on file  Other Topics Concern  . Not on file  Social History Narrative  . Not on file   Family History  Problem Relation Age of Onset  . Diabetes Father   . Hypertension Father   . Heart Problems Father   . Stroke Father     Objective: Office vital signs reviewed. BP 133/77   Pulse 80   Temp 97.6 F (36.4 C) (Oral)   Ht 5\' 10"  (1.778 m)   Wt 249 lb (112.9 kg)   BMI 35.73 kg/m   Physical Examination:  General: Awake, alert, obese, No acute distress GU: No palpable defects within the inguinal canal.  No appreciable inguinal or femoral hernias or masses. MSK: antlagic gait and normal station Skin: dry; intact; no rashes or lesions Neuro: slight numbness noted along the right lateral hip.  Assessment/ Plan: 62 y.o. male   1. Meralgia paraesthetica, right I suspect that this is likely meralgia paresthetica given intermittent nature of symptoms.  No evidence of inguinal hernia or masses within the pelvis.  However, the differential diagnoses considered include irritation of the L1 nerve branch given known degenerative changes in the back.  I reviewed his MRI which did demonstrate a mild disc bulge at L1 on L2 in 2015.  Because the symptoms are intermittent and do not last long, we discussed that weight loss and avoiding tight clothing should be pursued first.  If this is not effective, or the symptoms become worse or more frequent, would consider adding something like gabapentin versus performing a nerve block with orthopedics. Patient was good understanding.  He will follow-up as needed.    Janora Norlander, DO [place] [phone]

## 2017-11-08 ENCOUNTER — Other Ambulatory Visit: Payer: Self-pay

## 2017-11-08 ENCOUNTER — Encounter (HOSPITAL_BASED_OUTPATIENT_CLINIC_OR_DEPARTMENT_OTHER): Payer: Self-pay | Admitting: *Deleted

## 2017-11-12 ENCOUNTER — Encounter (HOSPITAL_BASED_OUTPATIENT_CLINIC_OR_DEPARTMENT_OTHER)
Admission: RE | Admit: 2017-11-12 | Discharge: 2017-11-12 | Disposition: A | Discharge status: Home / Self Care | Payer: BLUE CROSS/BLUE SHIELD | Source: Ambulatory Visit | Attending: Orthopedic Surgery | Admitting: Orthopedic Surgery

## 2017-11-12 DIAGNOSIS — Z7984 Long term (current) use of oral hypoglycemic drugs: Secondary | ICD-10-CM | POA: Diagnosis not present

## 2017-11-12 DIAGNOSIS — I10 Essential (primary) hypertension: Secondary | ICD-10-CM | POA: Diagnosis not present

## 2017-11-12 DIAGNOSIS — Z96651 Presence of right artificial knee joint: Secondary | ICD-10-CM | POA: Diagnosis not present

## 2017-11-12 DIAGNOSIS — T8489XA Other specified complication of internal orthopedic prosthetic devices, implants and grafts, initial encounter: Secondary | ICD-10-CM | POA: Diagnosis not present

## 2017-11-12 DIAGNOSIS — Z87891 Personal history of nicotine dependence: Secondary | ICD-10-CM | POA: Diagnosis not present

## 2017-11-12 DIAGNOSIS — E119 Type 2 diabetes mellitus without complications: Secondary | ICD-10-CM | POA: Diagnosis not present

## 2017-11-12 DIAGNOSIS — Y831 Surgical operation with implant of artificial internal device as the cause of abnormal reaction of the patient, or of later complication, without mention of misadventure at the time of the procedure: Secondary | ICD-10-CM | POA: Diagnosis not present

## 2017-11-12 DIAGNOSIS — Z6835 Body mass index (BMI) 35.0-35.9, adult: Secondary | ICD-10-CM | POA: Diagnosis not present

## 2017-11-12 LAB — BASIC METABOLIC PANEL
ANION GAP: 11 (ref 5–15)
BUN: 17 mg/dL (ref 8–23)
CALCIUM: 8.9 mg/dL (ref 8.9–10.3)
CHLORIDE: 101 mmol/L (ref 98–111)
CO2: 25 mmol/L (ref 22–32)
CREATININE: 1.23 mg/dL (ref 0.61–1.24)
Glucose, Bld: 204 mg/dL — ABNORMAL HIGH (ref 70–99)
Potassium: 4.2 mmol/L (ref 3.5–5.1)
Sodium: 137 mmol/L (ref 135–145)

## 2017-11-14 ENCOUNTER — Ambulatory Visit (HOSPITAL_BASED_OUTPATIENT_CLINIC_OR_DEPARTMENT_OTHER): Payer: BLUE CROSS/BLUE SHIELD | Admitting: Anesthesiology

## 2017-11-14 ENCOUNTER — Encounter (HOSPITAL_BASED_OUTPATIENT_CLINIC_OR_DEPARTMENT_OTHER)
Admission: RE | Disposition: A | Discharge status: Home / Self Care | Payer: Self-pay | Source: Ambulatory Visit | Attending: Orthopedic Surgery

## 2017-11-14 ENCOUNTER — Encounter (HOSPITAL_BASED_OUTPATIENT_CLINIC_OR_DEPARTMENT_OTHER): Payer: Self-pay | Admitting: Anesthesiology

## 2017-11-14 ENCOUNTER — Ambulatory Visit (HOSPITAL_BASED_OUTPATIENT_CLINIC_OR_DEPARTMENT_OTHER)
Admission: RE | Admit: 2017-11-14 | Discharge: 2017-11-14 | Disposition: A | Discharge status: Home / Self Care | Payer: BLUE CROSS/BLUE SHIELD | Source: Ambulatory Visit | Attending: Orthopedic Surgery | Admitting: Orthopedic Surgery

## 2017-11-14 ENCOUNTER — Other Ambulatory Visit: Payer: Self-pay

## 2017-11-14 DIAGNOSIS — Z7984 Long term (current) use of oral hypoglycemic drugs: Secondary | ICD-10-CM | POA: Insufficient documentation

## 2017-11-14 DIAGNOSIS — Z6835 Body mass index (BMI) 35.0-35.9, adult: Secondary | ICD-10-CM | POA: Diagnosis not present

## 2017-11-14 DIAGNOSIS — Y831 Surgical operation with implant of artificial internal device as the cause of abnormal reaction of the patient, or of later complication, without mention of misadventure at the time of the procedure: Secondary | ICD-10-CM | POA: Insufficient documentation

## 2017-11-14 DIAGNOSIS — E119 Type 2 diabetes mellitus without complications: Secondary | ICD-10-CM | POA: Insufficient documentation

## 2017-11-14 DIAGNOSIS — I10 Essential (primary) hypertension: Secondary | ICD-10-CM | POA: Diagnosis not present

## 2017-11-14 DIAGNOSIS — T85848A Pain due to other internal prosthetic devices, implants and grafts, initial encounter: Secondary | ICD-10-CM | POA: Diagnosis not present

## 2017-11-14 DIAGNOSIS — Z96651 Presence of right artificial knee joint: Secondary | ICD-10-CM | POA: Insufficient documentation

## 2017-11-14 DIAGNOSIS — Z87891 Personal history of nicotine dependence: Secondary | ICD-10-CM | POA: Diagnosis not present

## 2017-11-14 DIAGNOSIS — T8489XA Other specified complication of internal orthopedic prosthetic devices, implants and grafts, initial encounter: Secondary | ICD-10-CM | POA: Insufficient documentation

## 2017-11-14 DIAGNOSIS — Z472 Encounter for removal of internal fixation device: Secondary | ICD-10-CM | POA: Diagnosis not present

## 2017-11-14 HISTORY — PX: HARDWARE REMOVAL: SHX979

## 2017-11-14 LAB — GLUCOSE, CAPILLARY
GLUCOSE-CAPILLARY: 170 mg/dL — AB (ref 70–99)
Glucose-Capillary: 166 mg/dL — ABNORMAL HIGH (ref 70–99)

## 2017-11-14 SURGERY — REMOVAL, HARDWARE
Anesthesia: Monitor Anesthesia Care | Site: Wrist | Laterality: Right

## 2017-11-14 MED ORDER — VANCOMYCIN HCL IN DEXTROSE 1-5 GM/200ML-% IV SOLN
INTRAVENOUS | Status: AC
  Filled 2017-11-14: qty 200

## 2017-11-14 MED ORDER — OXYCODONE HCL 5 MG PO TABS
5.0000 mg | ORAL_TABLET | Freq: Once | ORAL | Status: DC | PRN
Start: 2017-11-14 — End: 2017-11-14

## 2017-11-14 MED ORDER — MIDAZOLAM HCL 2 MG/2ML IJ SOLN
INTRAMUSCULAR | Status: AC
  Filled 2017-11-14: qty 2

## 2017-11-14 MED ORDER — KETOROLAC TROMETHAMINE 30 MG/ML IJ SOLN
INTRAMUSCULAR | Status: DC | PRN
  Administered 2017-11-14: 30 mg via INTRAVENOUS

## 2017-11-14 MED ORDER — SCOPOLAMINE 1 MG/3DAYS TD PT72: 1.0000 | MEDICATED_PATCH | Freq: Once | TRANSDERMAL | Status: DC | PRN

## 2017-11-14 MED ORDER — LIDOCAINE HCL (PF) 1 % IJ SOLN
INTRAMUSCULAR | Status: AC
  Filled 2017-11-14: qty 5

## 2017-11-14 MED ORDER — KETOROLAC TROMETHAMINE 30 MG/ML IJ SOLN
INTRAMUSCULAR | Status: AC
  Filled 2017-11-14: qty 1

## 2017-11-14 MED ORDER — HYDROCODONE-ACETAMINOPHEN 5-325 MG PO TABS: 1.0000 | ORAL_TABLET | Freq: Four times a day (QID) | ORAL | 0 refills | Status: DC | PRN

## 2017-11-14 MED ORDER — PROPOFOL 500 MG/50ML IV EMUL
INTRAVENOUS | Status: AC
  Filled 2017-11-14: qty 50

## 2017-11-14 MED ORDER — ACETAMINOPHEN 325 MG PO TABS: 325.0000 mg | ORAL_TABLET | ORAL | Status: DC | PRN

## 2017-11-14 MED ORDER — PROPOFOL 500 MG/50ML IV EMUL
INTRAVENOUS | Status: DC | PRN
  Administered 2017-11-14: 50 ug/kg/min via INTRAVENOUS

## 2017-11-14 MED ORDER — LIDOCAINE HCL (PF) 0.5 % IJ SOLN
INTRAMUSCULAR | Status: DC | PRN
  Administered 2017-11-14: 50 mL via INTRAVENOUS

## 2017-11-14 MED ORDER — LIDOCAINE 2% (20 MG/ML) 5 ML SYRINGE
INTRAMUSCULAR | Status: AC
  Filled 2017-11-14: qty 5

## 2017-11-14 MED ORDER — VANCOMYCIN HCL IN DEXTROSE 1-5 GM/200ML-% IV SOLN
1000.0000 mg | INTRAVENOUS | Status: AC
  Administered 2017-11-14: 1000 mg via INTRAVENOUS

## 2017-11-14 MED ORDER — CHLORHEXIDINE GLUCONATE 4 % EX LIQD: 60.0000 mL | Freq: Once | CUTANEOUS | Status: DC

## 2017-11-14 MED ORDER — FENTANYL CITRATE (PF) 100 MCG/2ML IJ SOLN
50.0000 ug | INTRAMUSCULAR | Status: DC | PRN
  Administered 2017-11-14 (×2): 50 ug via INTRAVENOUS

## 2017-11-14 MED ORDER — OXYCODONE HCL 5 MG/5ML PO SOLN: 5.0000 mg | Freq: Once | ORAL | Status: DC | PRN

## 2017-11-14 MED ORDER — FENTANYL CITRATE (PF) 100 MCG/2ML IJ SOLN
INTRAMUSCULAR | Status: AC
  Filled 2017-11-14: qty 2

## 2017-11-14 MED ORDER — MIDAZOLAM HCL 2 MG/2ML IJ SOLN
1.0000 mg | INTRAMUSCULAR | Status: DC | PRN
  Administered 2017-11-14 (×2): 1 mg via INTRAVENOUS

## 2017-11-14 MED ORDER — LACTATED RINGERS IV SOLN
INTRAVENOUS | Status: DC
  Administered 2017-11-14: 08:00:00 via INTRAVENOUS

## 2017-11-14 MED ORDER — FENTANYL CITRATE (PF) 100 MCG/2ML IJ SOLN: 25.0000 ug | INTRAMUSCULAR | Status: DC | PRN

## 2017-11-14 MED ORDER — BUPIVACAINE HCL (PF) 0.25 % IJ SOLN
INTRAMUSCULAR | Status: AC
  Filled 2017-11-14: qty 120

## 2017-11-14 MED ORDER — BUPIVACAINE HCL (PF) 0.25 % IJ SOLN
INTRAMUSCULAR | Status: DC | PRN
  Administered 2017-11-14: 6 mL

## 2017-11-14 MED ORDER — ACETAMINOPHEN 160 MG/5ML PO SOLN: 325.0000 mg | ORAL | Status: DC | PRN

## 2017-11-14 SURGICAL SUPPLY — 41 items
BLADE SURG 15 STRL LF DISP TIS (BLADE) ×1 IMPLANT
BLADE SURG 15 STRL SS (BLADE) ×2
BNDG CMPR 9X4 STRL LF SNTH (GAUZE/BANDAGES/DRESSINGS) ×1
BNDG COHESIVE 3X5 TAN STRL LF (GAUZE/BANDAGES/DRESSINGS) ×2 IMPLANT
BNDG ESMARK 4X9 LF (GAUZE/BANDAGES/DRESSINGS) ×2 IMPLANT
BNDG GAUZE ELAST 4 BULKY (GAUZE/BANDAGES/DRESSINGS) ×2 IMPLANT
CHLORAPREP W/TINT 26ML (MISCELLANEOUS) ×2 IMPLANT
CORD BIPOLAR FORCEPS 12FT (ELECTRODE) ×2 IMPLANT
COVER BACK TABLE 60X90IN (DRAPES) ×2 IMPLANT
COVER MAYO STAND STRL (DRAPES) ×2 IMPLANT
CUFF TOURNIQUET SINGLE 18IN (TOURNIQUET CUFF) ×2 IMPLANT
DECANTER SPIKE VIAL GLASS SM (MISCELLANEOUS) IMPLANT
DRAPE EXTREMITY T 121X128X90 (DRAPE) ×2 IMPLANT
DRAPE OEC MINIVIEW 54X84 (DRAPES) ×2 IMPLANT
DRAPE SURG 17X23 STRL (DRAPES) ×2 IMPLANT
GAUZE SPONGE 4X4 12PLY STRL (GAUZE/BANDAGES/DRESSINGS) ×2 IMPLANT
GAUZE XEROFORM 1X8 LF (GAUZE/BANDAGES/DRESSINGS) ×2 IMPLANT
GLOVE BIO SURGEON STRL SZ 6.5 (GLOVE) ×2 IMPLANT
GLOVE BIOGEL PI IND STRL 7.0 (GLOVE) ×1 IMPLANT
GLOVE BIOGEL PI IND STRL 8 (GLOVE) ×1 IMPLANT
GLOVE BIOGEL PI IND STRL 8.5 (GLOVE) ×1 IMPLANT
GLOVE BIOGEL PI INDICATOR 7.0 (GLOVE) ×1
GLOVE BIOGEL PI INDICATOR 8 (GLOVE) ×1
GLOVE BIOGEL PI INDICATOR 8.5 (GLOVE) ×1
GLOVE SURG ORTHO 8.0 STRL STRW (GLOVE) ×2 IMPLANT
GOWN STRL REUS W/ TWL LRG LVL3 (GOWN DISPOSABLE) ×1 IMPLANT
GOWN STRL REUS W/TWL LRG LVL3 (GOWN DISPOSABLE) ×2
GOWN STRL REUS W/TWL XL LVL3 (GOWN DISPOSABLE) ×2 IMPLANT
NS IRRIG 1000ML POUR BTL (IV SOLUTION) ×2 IMPLANT
PACK BASIN DAY SURGERY FS (CUSTOM PROCEDURE TRAY) ×2 IMPLANT
PAD CAST 3X4 CTTN HI CHSV (CAST SUPPLIES) ×1 IMPLANT
PADDING CAST COTTON 3X4 STRL (CAST SUPPLIES) ×2
SPLINT PLASTER CAST XFAST 3X15 (CAST SUPPLIES) IMPLANT
SPLINT PLASTER XTRA FASTSET 3X (CAST SUPPLIES)
STOCKINETTE 4X48 STRL (DRAPES) ×2 IMPLANT
SUT CHROMIC 5 0 P 3 (SUTURE) IMPLANT
SUT ETHILON 4 0 PS 2 18 (SUTURE) ×2 IMPLANT
SUT VICRYL 4-0 PS2 18IN ABS (SUTURE) IMPLANT
SYR BULB 3OZ (MISCELLANEOUS) ×2 IMPLANT
SYR CONTROL 10ML LL (SYRINGE) IMPLANT
UNDERPAD 30X30 (UNDERPADS AND DIAPERS) ×2 IMPLANT

## 2017-11-14 NOTE — Anesthesia Procedure Notes (Signed)
Anesthesia Regional Block: Bier block (IV Regional)   Pre-Anesthetic Checklist: ,, timeout performed, Correct Patient, Correct Site, Correct Laterality, Correct Procedure,, site marked, surgical consent,, at surgeon's request  Laterality: Right  Prep: alcohol swabs       Needles:  Injection technique: Single-shot  Needle Type: Other      Needle Gauge: 22     Additional Needles:   Procedures:,,,,, intact distal pulses, Esmarch exsanguination, single tourniquet utilized,  Narrative:  Start time: 11/14/2017 8:35 AM End time: 11/14/2017 8:37 AM  Performed by: Personally

## 2017-11-14 NOTE — H&P (Signed)
[name] is an 62 y.o. male.   Chief Complaint: post 4 bone fusionHPI: [name] is a 62 yo male post  ulnar for bone fusion right wrist.  He has been in a bone stimulator. Pain is minimal at the present time. His range of motion is approximately 40 degrees total. Has full flexion extension of his fingers. He is stimulator. He has had collapse around his AccuTrack screws.X-rays AP and lateral reveal  Screws are slightly long due to the collapse around the screws. We will recommend removal.    Past Medical History:  Diagnosis Date  . Arthritis    rheumatoid arthritis  . Diabetes mellitus   . Hyperlipidemia   . Hypertension   . Primary localized osteoarthritis of right knee 05/13/2015  . Scapho-lunate dissociation, right   . Vitamin D deficiency     Past Surgical History:  Procedure Laterality Date  . CARDIAC CATHETERIZATION  2005   normal  . CARPAL TUNNEL RELEASE  2009   both hands  . KNEE ARTHROSCOPY     rt  . PARTIAL KNEE ARTHROPLASTY Right 05/13/2015   Procedure: RIGHT UNI KNEE ARTHROPLASTY;  Surgeon: Marchia Bond, MD;  Location: [facility];  Service: Orthopedics;  Laterality: Right;  . scaphoid excision Left 2012   Dr Daylene Katayama  . TONSILLECTOMY    . WRIST FUSION Right 03/14/2017   Procedure: SCAPHOID EXCISION ULNAR FOUR BONE FUSION RIGHT;  Surgeon: Daryll Brod, MD;  Location: [facility];  Service: Orthopedics;  Laterality: Right;    Family History  Problem Relation Age of Onset  . Diabetes Father   . Hypertension Father   . Heart Problems Father   . Stroke Father    Social History:  reports that he quit smoking about 26 years ago. He has never used smokeless tobacco. He reports that he drinks alcohol. He reports that he does not use drugs.  Allergies:  Allergies  Allergen Reactions  . Other Shortness Of Breath, Swelling and Hives  . Shellfish Allergy Shortness Of Breath and Swelling  . Doxycycline Nausea And Vomiting  . Invokana  [Canagliflozin] Other (See Comments)    Causes bladder infections  . Keflex [Cephalexin]     Pt does not tol-nervous    No medications prior to admission.    Results for orders placed or performed during the hospital encounter of 11/14/17 (from the past 48 hour(s))  Basic metabolic panel     Status: Abnormal   Collection Time: 11/12/17 12:03 PM  Result Value Ref Range   Sodium 137 135 - 145 mmol/L   Potassium 4.2 3.5 - 5.1 mmol/L   Chloride 101 98 - 111 mmol/L   CO2 25 22 - 32 mmol/L   Glucose, Bld 204 (H) 70 - 99 mg/dL   BUN 17 8 - 23 mg/dL   Creatinine, Ser 1.23 0.61 - 1.24 mg/dL   Calcium 8.9 8.9 - 10.3 mg/dL   GFR calc non Af Amer >60 >60 mL/min   GFR calc Af Amer >60 >60 mL/min    Comment: (NOTE) The eGFR has been calculated using the CKD EPI equation. This calculation has not been validated in all clinical situations. eGFR's persistently <60 mL/min signify possible Chronic Kidney Disease.    Anion gap 11 5 - 15    Comment: Performed at [facility] [address] [place], [place] [address]    No results found.   Pertinent items are noted in HPI.  Height 5' 10"  (1.778 m),  weight 111.1 kg.  General appearance: alert, cooperative and appears stated age Head: Normocephalic, without obvious abnormality Neck: no JVD Resp: clear to auscultation bilaterally Cardio: regular rate and rhythm, S1, S2 normal, no murmur, click, rub or gallop GI: soft, non-tender; bowel sounds normal; no masses,  no organomegaly Extremities: right wrist pain Pulses: 2+ and symmetric Skin: Skin color, texture, turgor normal. No rashes or lesions Neurologic: Grossly normal Incision/Wound: na  Assessment/Plan He is scheduled for removal AccuTrack screws in outpatient under regional anesthesia. Pre-peri-postoperative course are discussed along with risks and complications. Shins are encouraged and answered to his satisfaction.  Jatoya Armbrister R 11/14/2017, 5:35 AM

## 2017-11-14 NOTE — Transfer of Care (Signed)
Immediate Anesthesia Transfer of Care Note  Patient: [name]  Procedure(s) Performed: REMOVAL ACCUTRAC SCREW RIGHT WRIST (Right Wrist)  Patient Location: PACU  Anesthesia Type:Bier block  Level of Consciousness: awake, alert  and oriented  Airway & Oxygen Therapy: Patient Spontanous Breathing  Post-op Assessment: Report given to RN and Post -op Vital signs reviewed and stable  Post vital signs: Reviewed and stable  Last Vitals:  Vitals Value Taken Time  BP    Temp    Pulse    Resp    SpO2      Last Pain:  Vitals:   11/14/17 0727  TempSrc: Oral  PainSc: 4          Complications: No apparent anesthesia complications

## 2017-11-14 NOTE — Op Note (Signed)
NAME: REEDER BRISBY MEDICAL RECORD NO: [id number] DATE OF BIRTH: 1956/03/29 FACILITY[facility] LOCATION: [facility] PHYSICIAN: Wynonia Sours, MD   OPERATIVE REPORT   DATE OF PROCEDURE: 11/14/17    PREOPERATIVE DIAGNOSIS:   Retained hardware right wrist   POSTOPERATIVE DIAGNOSIS:   Same   PROCEDURE:   Removal AccuTrack screws  right wrist   SURGEON: Daryll Brod, M.D.   ASSISTANT: none   ANESTHESIA:  Bier block with sedation and Local   INTRAVENOUS FLUIDS:  Per anesthesia flow sheet.   ESTIMATED BLOOD LOSS:  Minimal.   COMPLICATIONS:  None.   SPECIMENS:  none   TOURNIQUET TIME:    Total Tourniquet Time Documented: Upper Arm (Right) - 38 minutes Total: Upper Arm (Right) - 38 minutes    DISPOSITION:  Stable to PACU.   INDICATIONS: Patient is a 62 year old male status post scaphoid excision with 4 bone fusion with antegrade AccuTrack screws with collapse of his healing and prominent screws proximally.  He is admitted for removal of 2 AccuTrack screws.  Pre-peri-postoperative course been discussed along with risk complications.  He is aware there is no guarantee that surgery possibility of infection recurrence injury to arteries nerves tendons complete release symptoms dystrophy.  Preoperative area the patient seen extremity marked by both patient and surgeon antibiotic given  OPERATIVE COURSE: Patient brought to the operating room where an upper arm IV regional anesthetic was carried out without difficulty was prepped using ChloraPrep supine position with the right arm free.  A three-minute dry time was allowed timeout taken to confirm patient procedure.  Image intensification unit was brought in the to the procedure and the area of the 2 screws proximally was marked on the skin.  A longitudinal incision using the old incision was made carried down through subcutaneous tissue.  Significant amount of scar was immediately encountered through the entire procedure.   The dissection carried down through the fourth dorsal compartment to the level of the radiocarpal ligaments.  A transverse incision was made allowing visualization of the screw and the lunate.  The wrist was flexed much as possible the screw was engaged after removal of soft tissue part with a small house curette and rondure.  The screw was removed without significant difficulty.  The second screw was in the triquetrum.  The dissection carried ulnarly.  The base of the fifth compartment was opened allowing visualization of the screw.  The screw was then removed without significant difficulty.  The wound was copiously irrigated with saline.  The capsule was closed with figure-of-eight 4-0 Vicryl sutures.  The subcutaneous tissues were retinaculum closed in layers with 4-0 Vicryl and skin with interrupted 4-0 nylon sutures.  A sterile compressive dressing and volar splint was applied.  Inflation of the tourniquet all fingers immediately pink.  He was taken to the recovery room for observation in satisfactory condition.  Be discharged home to return [facility] in 1week Tylenol ibuprofen with Norco for breakthrough.   Wynonia Sours, MD Electronically signed, 11/14/17

## 2017-11-14 NOTE — Discharge Instructions (Addendum)
[facility] Instructions Hand Surgery  Wound Care: Keep your hand elevated above the level of your heart.  Do not allow it to dangle by your side.  Keep the dressing dry and do not remove it unless your doctor advises you to do so.  He will usually change it at the time of your post-op visit.  Moving your fingers is advised to stimulate circulation but will depend on the site of your surgery.  If you have a splint applied, your doctor will advise you regarding movement.  Activity: Do not drive or operate machinery today.  Rest today and then you may return to your normal activity and work as indicated by your physician.  Diet:  Drink liquids today or eat a light diet.  You may resume a regular diet tomorrow.    General expectations: Pain for two to three days. Fingers may become slightly swollen.  Call your doctor if any of the following occur: Severe pain not relieved by pain medication. Elevated temperature. Dressing soaked with blood. Inability to move fingers. White or bluish color to fingers.    Post Anesthesia Home Care Instructions  Activity: Get plenty of rest for the remainder of the day. A responsible individual must stay with you for 24 hours following the procedure.  For the next 24 hours, DO NOT: -Drive a car -Paediatric nurse -Drink alcoholic beverages -Take any medication unless instructed by your physician -Make any legal decisions or sign important papers.  Meals: Start with liquid foods such as gelatin or soup. Progress to regular foods as tolerated. Avoid greasy, spicy, heavy foods. If nausea and/or vomiting occur, drink only clear liquids until the nausea and/or vomiting subsides. Call your physician if vomiting continues.  Special Instructions/Symptoms: Your throat may feel dry or sore from the anesthesia or the breathing tube placed in your throat during surgery. If this causes discomfort, gargle with warm salt water. The discomfort should disappear  within 24 hours.  If you had a scopolamine patch placed behind your ear for the management of post- operative nausea and/or vomiting:  1. The medication in the patch is effective for 72 hours, after which it should be removed.  Wrap patch in a tissue and discard in the trash. Wash hands thoroughly with soap and water. 2. You may remove the patch earlier than 72 hours if you experience unpleasant side effects which may include dry mouth, dizziness or visual disturbances. 3. Avoid touching the patch. Wash your hands with soap and water after contact with the patch.    Post Anesthesia Home Care Instructions  Activity: Get plenty of rest for the remainder of the day. A responsible individual must stay with you for 24 hours following the procedure.  For the next 24 hours, DO NOT: -Drive a car -Paediatric nurse -Drink alcoholic beverages -Take any medication unless instructed by your physician -Make any legal decisions or sign important papers.  Meals: Start with liquid foods such as gelatin or soup. Progress to regular foods as tolerated. Avoid greasy, spicy, heavy foods. If nausea and/or vomiting occur, drink only clear liquids until the nausea and/or vomiting subsides. Call your physician if vomiting continues.  Special Instructions/Symptoms: Your throat may feel dry or sore from the anesthesia or the breathing tube placed in your throat during surgery. If this causes discomfort, gargle with warm salt water. The discomfort should disappear within 24 hours.  If you had a scopolamine patch placed behind your ear for the management of post- operative nausea and/or vomiting:  1. The medication in the patch is effective for 72 hours, after which it should be removed.  Wrap patch in a tissue and discard in the trash. Wash hands thoroughly with soap and water. 2. You may remove the patch earlier than 72 hours if you experience unpleasant side effects which may include dry mouth, dizziness or  visual disturbances. 3. Avoid touching the patch. Wash your hands with soap and water after contact with the patch.   Call your surgeon if you experience:   1.  Fever over 101.0. 2.  Inability to urinate. 3.  Nausea and/or vomiting. 4.  Extreme swelling or bruising at the surgical site. 5.  Continued bleeding from the incision. 6.  Increased pain, redness or drainage from the incision. 7.  Problems related to your pain medication. 8.  Any problems and/or concerns

## 2017-11-14 NOTE — Anesthesia Preprocedure Evaluation (Addendum)
Anesthesia Evaluation  Patient identified by MRN, date of birth, ID band Patient awake    Reviewed: Allergy & Precautions, NPO status , Patient's Chart, lab work & pertinent test results  History of Anesthesia Complications Negative for: history of anesthetic complications  Airway Mallampati: III  TM Distance: >3 FB Neck ROM: Full    Dental  (+) Teeth Intact   Pulmonary neg shortness of breath, neg sleep apnea, neg COPD, neg recent URI, former smoker,    breath sounds clear to auscultation       Cardiovascular hypertension, Pt. on medications (-) angina(-) Past MI and (-) CHF  Rhythm:Regular     Neuro/Psych negative neurological ROS  negative psych ROS   GI/Hepatic negative GI ROS, Neg liver ROS,   Endo/Other  diabetes, Type 2, Oral Hypoglycemic AgentsMorbid obesity  Renal/GU negative Renal ROS     Musculoskeletal  (+) Arthritis ,   Abdominal   Peds  Hematology negative hematology ROS (+)   Anesthesia Other Findings   Reproductive/Obstetrics                             Anesthesia Physical Anesthesia Plan  ASA: II  Anesthesia Plan: MAC and Bier Block and Bier Block-LIDOCAINE ONLY   Post-op Pain Management:    Induction:   PONV Risk Score and Plan: 1 and Ondansetron and Treatment may vary due to age or medical condition  Airway Management Planned: Nasal Cannula  Additional Equipment: None  Intra-op Plan:   Post-operative Plan:   Informed Consent: I have reviewed the patients History and Physical, chart, labs and discussed the procedure including the risks, benefits and alternatives for the proposed anesthesia with the patient or authorized representative who has indicated his/her understanding and acceptance.   Dental advisory given  Plan Discussed with: CRNA and Surgeon  Anesthesia Plan Comments: (Patient requested sedation rather than GETA )       Anesthesia Quick  Evaluation

## 2017-11-14 NOTE — Brief Op Note (Signed)
11/14/2017  9:30 AM  PATIENT:  [name]  62 y.o. male  PRE-OPERATIVE DIAGNOSIS:    S/P Ulnar 4-Bone Fusion Right  POST-OPERATIVE DIAGNOSIS:  S/P Ulnar 4-Bone Fusion Right  PROCEDURE:  Procedure(s): REMOVAL ACCUTRAC SCREW RIGHT WRIST (Right)  SURGEON:  Surgeon(s) and Role:    * Daryll Brod, MD - Primary  PHYSICIAN ASSISTANT:   ASSISTANTS: none   ANESTHESIA:   local, regional and IV sedation  EBL:  10 mL   BLOOD ADMINISTERED:none  DRAINS: none   LOCAL MEDICATIONS USED:  BUPIVICAINE   SPECIMEN:  No Specimen  DISPOSITION OF SPECIMEN:  N/A  COUNTS:  YES  TOURNIQUET:   Total Tourniquet Time Documented: Upper Arm (Right) - 38 minutes Total: Upper Arm (Right) - 38 minutes   DICTATION: .Viviann Spare Dictation  PLAN OF CARE: Discharge to home after PACU  PATIENT DISPOSITION:  PACU - hemodynamically stable.

## 2017-11-15 ENCOUNTER — Other Ambulatory Visit: Payer: Self-pay | Admitting: Family Medicine

## 2017-11-15 ENCOUNTER — Encounter (HOSPITAL_BASED_OUTPATIENT_CLINIC_OR_DEPARTMENT_OTHER): Payer: Self-pay | Admitting: Orthopedic Surgery

## 2017-11-15 ENCOUNTER — Other Ambulatory Visit: Payer: Self-pay | Admitting: Family

## 2017-11-15 DIAGNOSIS — J069 Acute upper respiratory infection, unspecified: Secondary | ICD-10-CM

## 2017-11-15 DIAGNOSIS — A6002 Herpesviral infection of other male genital organs: Secondary | ICD-10-CM

## 2017-11-15 NOTE — Anesthesia Postprocedure Evaluation (Signed)
Anesthesia Post Note  Patient: [name]  Procedure(s) Performed: REMOVAL ACCUTRAC SCREW RIGHT WRIST (Right Wrist)     Patient location during evaluation: PACU Anesthesia Type: MAC and Bier Block Level of consciousness: awake and alert Pain management: pain level controlled Vital Signs Assessment: post-procedure vital signs reviewed and stable Respiratory status: spontaneous breathing, nonlabored ventilation, respiratory function stable and patient connected to nasal cannula oxygen Cardiovascular status: stable and blood pressure returned to baseline Postop Assessment: no apparent nausea or vomiting Anesthetic complications: no    Last Vitals:  Vitals:   11/14/17 1000 11/14/17 1019  BP: 122/79 122/79  Pulse: 67   Resp: 12 16  Temp:    SpO2: 98%     Last Pain:  Vitals:   11/14/17 1019  TempSrc:   PainSc: 3                  Khy Pitre

## 2017-11-26 DIAGNOSIS — M47816 Spondylosis without myelopathy or radiculopathy, lumbar region: Secondary | ICD-10-CM | POA: Diagnosis not present

## 2017-11-26 DIAGNOSIS — M545 Low back pain: Secondary | ICD-10-CM | POA: Diagnosis not present

## 2017-11-26 DIAGNOSIS — M546 Pain in thoracic spine: Secondary | ICD-10-CM | POA: Diagnosis not present

## 2017-11-29 ENCOUNTER — Ambulatory Visit: Payer: BLUE CROSS/BLUE SHIELD | Admitting: Family

## 2017-11-29 DIAGNOSIS — M0609 Rheumatoid arthritis without rheumatoid factor, multiple sites: Secondary | ICD-10-CM | POA: Diagnosis not present

## 2017-12-03 ENCOUNTER — Ambulatory Visit: Payer: BLUE CROSS/BLUE SHIELD | Admitting: Family

## 2017-12-03 ENCOUNTER — Encounter: Payer: Self-pay | Admitting: Family

## 2017-12-03 VITALS — BP 138/82 | HR 74 | Temp 97.7°F | Ht 70.0 in | Wt 250.0 lb

## 2017-12-03 DIAGNOSIS — I152 Hypertension secondary to endocrine disorders: Secondary | ICD-10-CM

## 2017-12-03 DIAGNOSIS — B372 Candidiasis of skin and nail: Secondary | ICD-10-CM

## 2017-12-03 DIAGNOSIS — E1165 Type 2 diabetes mellitus with hyperglycemia: Secondary | ICD-10-CM

## 2017-12-03 DIAGNOSIS — I10 Essential (primary) hypertension: Secondary | ICD-10-CM

## 2017-12-03 DIAGNOSIS — E1159 Type 2 diabetes mellitus with other circulatory complications: Secondary | ICD-10-CM

## 2017-12-03 DIAGNOSIS — E785 Hyperlipidemia, unspecified: Secondary | ICD-10-CM

## 2017-12-03 DIAGNOSIS — M059 Rheumatoid arthritis with rheumatoid factor, unspecified: Secondary | ICD-10-CM

## 2017-12-03 DIAGNOSIS — E1169 Type 2 diabetes mellitus with other specified complication: Secondary | ICD-10-CM

## 2017-12-03 LAB — CMP14+EGFR
ALT: 20 IU/L (ref 0–44)
AST: 20 IU/L (ref 0–40)
Albumin/Globulin Ratio: 1.7 (ref 1.2–2.2)
Albumin: 4.3 g/dL (ref 3.6–4.8)
Alkaline Phosphatase: 58 IU/L (ref 39–117)
BILIRUBIN TOTAL: 0.2 mg/dL (ref 0.0–1.2)
BUN / CREAT RATIO: 14 (ref 10–24)
BUN: 17 mg/dL (ref 8–27)
CO2: 22 mmol/L (ref 20–29)
Calcium: 9.4 mg/dL (ref 8.6–10.2)
Chloride: 101 mmol/L (ref 96–106)
Creatinine, Ser: 1.24 mg/dL (ref 0.76–1.27)
GFR calc Af Amer: 71 mL/min/{1.73_m2} (ref >59)
GFR calc non Af Amer: 61 mL/min/{1.73_m2} (ref >59)
GLOBULIN, TOTAL: 2.6 g/dL (ref 1.5–4.5)
GLUCOSE: 113 mg/dL — AB (ref 65–99)
Potassium: 4.6 mmol/L (ref 3.5–5.2)
Sodium: 138 mmol/L (ref 134–144)
Total Protein: 6.9 g/dL (ref 6.0–8.5)

## 2017-12-03 LAB — LIPID PANEL
Chol/HDL Ratio: 4.2 ratio (ref 0.0–5.0)
Cholesterol, Total: 121 mg/dL (ref 100–199)
HDL: 29 mg/dL — ABNORMAL LOW (ref >39)
LDL CALC: 76 mg/dL (ref 0–99)
Triglycerides: 80 mg/dL (ref 0–149)
VLDL CHOLESTEROL CAL: 16 mg/dL (ref 5–40)

## 2017-12-03 LAB — BAYER DCA HB A1C WAIVED: HB A1C (BAYER DCA - WAIVED): 7.6 % — ABNORMAL HIGH (ref <7.0)

## 2017-12-03 MED ORDER — NYSTATIN 100000 UNIT/GM EX CREA: 1.0000 "application " | TOPICAL_CREAM | Freq: Two times a day (BID) | CUTANEOUS | 0 refills | Status: DC

## 2017-12-03 MED ORDER — NYSTATIN 100000 UNIT/GM EX POWD: Freq: Four times a day (QID) | CUTANEOUS | 0 refills | Status: DC

## 2017-12-03 NOTE — Patient Instructions (Signed)
Skin Yeast Infection Skin yeast infection is a condition in which there is an overgrowth of yeast (candida) that normally lives on the skin. This condition usually occurs in areas of the skin that are constantly warm and moist, such as the armpits or the groin. What are the causes? This condition is caused by a change in the normal balance of the yeast and bacteria that live on the skin. What increases the risk? This condition is more likely to develop in:  People who are obese.  Pregnant women.  Women who take birth control pills.  People who have diabetes.  People who take antibiotic medicines.  People who take steroid medicines.  People who are malnourished.  People who have a weak defense (immune) system.  People who are 65 years of age or older.  What are the signs or symptoms? Symptoms of this condition include:  A red, swollen area of the skin.  Bumps on the skin.  Itchiness.  How is this diagnosed? This condition is diagnosed with a medical history and physical exam. Your health care provider may check for yeast by taking light scrapings of the skin to be viewed under a microscope. How is this treated? This condition is treated with medicine. Medicines may be prescribed or be available over-the-counter. The medicines may be:  Taken by mouth (orally).  Applied as a cream.  Follow these instructions at home:  Take or apply over-the-counter and prescription medicines only as told by your health care provider.  Eat more yogurt. This may help to keep your yeast infection from returning.  Maintain a healthy weight. If you need help losing weight, talk with your health care provider.  Keep your skin clean and dry.  If you have diabetes, keep your blood sugar under control. Contact a health care provider if:  Your symptoms go away and then return.  Your symptoms do not get better with treatment.  Your symptoms get worse.  Your rash spreads.  You have a  fever or chills.  You have new symptoms.  You have new warmth or redness of your skin. This information is not intended to replace advice given to you by your health care provider. Make sure you discuss any questions you have with your health care provider. Document Released: 12/12/2010 Document Revised: 11/20/2015 Document Reviewed: 09/27/2014 Elsevier Interactive Patient Education  2018 [employer]  

## 2017-12-03 NOTE — Progress Notes (Signed)
Subjective:    Patient ID: [name], male    DOB: Apr 15, 1955, 62 y.o.   MRN: [id number]  Chief Complaint  Patient presents with  . Medical Management of Chronic Issues    three month recheck   PT presents to the office today for chronic follow up. Pt has rheumatoid arthritis and followed by rheumatologists every 6 months.Pt is followed by Cardiologists annually. Diabetes  He presents for his follow-up diabetic visit. He has type 2 diabetes mellitus. There are no hypoglycemic associated symptoms. Pertinent negatives for diabetes include no blurred vision, no foot paresthesias and no visual change. Symptoms are stable. Pertinent negatives for diabetic complications include no CVA, heart disease or nephropathy. Risk factors for coronary artery disease include diabetes mellitus, dyslipidemia, male sex, hypertension and sedentary lifestyle. He is following a generally healthy diet. His overall blood glucose range is 130-140 mg/dl. Eye exam is current.  Hypertension  This is a chronic problem. The current episode started more than 1 year ago. The problem has been resolved since onset. The problem is controlled. Associated symptoms include malaise/fatigue. Pertinent negatives include no blurred vision, peripheral edema or shortness of breath. Risk factors for coronary artery disease include diabetes mellitus, dyslipidemia, obesity and male gender. The current treatment provides moderate improvement. There is no history of CVA.  Hyperlipidemia  This is a chronic problem. The current episode started more than 1 year ago. The problem is controlled. Recent lipid tests were reviewed and are normal. Exacerbating diseases include obesity. Pertinent negatives include no shortness of breath. Current antihyperlipidemic treatment includes statins. The current treatment provides moderate improvement of lipids. Risk factors for coronary artery disease include dyslipidemia, diabetes mellitus, hypertension, male  sex and a sedentary lifestyle.  Arthritis  Presents for follow-up visit. He complains of pain and stiffness. The symptoms have been stable. Affected locations include the right MCP, left MCP, right knee, left knee, left ankle and right ankle. His pain is at a severity of 6/10.      Review of Systems  Constitutional: Positive for malaise/fatigue.  Eyes: Negative for blurred vision.  Respiratory: Negative for shortness of breath.   Musculoskeletal: Positive for arthritis and stiffness.  All other systems reviewed and are negative.      Objective:   Physical Exam  Constitutional: He is oriented to person, place, and time. He appears well-developed and well-nourished. No distress.  HENT:  Head: Normocephalic.  Right Ear: External ear normal.  Left Ear: External ear normal.  Mouth/Throat: Oropharynx is clear and moist.  Eyes: Pupils are equal, round, and reactive to light. Right eye exhibits no discharge. Left eye exhibits no discharge.  Neck: Normal range of motion. Neck supple. No thyromegaly present.  Cardiovascular: Normal rate, regular rhythm, normal heart sounds and intact distal pulses.  No murmur heard. Pulmonary/Chest: Effort normal and breath sounds normal. No respiratory distress. He has no wheezes.  Abdominal: Soft. Bowel sounds are normal. He exhibits no distension. There is no tenderness.  Musculoskeletal: Normal range of motion. He exhibits no edema or tenderness.  Neurological: He is alert and oriented to person, place, and time. He has normal reflexes. No cranial nerve deficit.  Skin: Skin is warm and dry. Rash (erythemas yeast rash under right axilla ) noted. No erythema.  Psychiatric: He has a normal mood and affect. His behavior is normal. Judgment and thought content normal.  Vitals reviewed.     BP 138/82   Pulse 74   Temp 97.7 F (36.5 C) (Oral)  Ht 5' 10"  (1.778 m)   Wt 250 lb (113.4 kg)   BMI 35.87 kg/m      Assessment & Plan:  [name]  comes in today with chief complaint of Medical Management of Chronic Issues (three month recheck)   Diagnosis and orders addressed:  1. Type 2 diabetes mellitus with hyperglycemia, without long-term current use of insulin (HCC) - Bayer DCA Hb A1c Waived - CMP14+EGFR - Microalbumin / creatinine urine ratio  2. Hyperlipidemia associated with type 2 diabetes mellitus (HCC) - CMP14+EGFR - Lipid panel  3. Hypertension associated with diabetes ([place]) - CMP14+EGFR  4. Morbid obesity ([place]) - CMP14+EGFR  5. Rheumatoid arthritis with positive rheumatoid factor, involving unspecified site (HCC) - CMP14+EGFR  6. Skin yeast infection -Keep clean and dry - CMP14+EGFR - nystatin (MYCOSTATIN/NYSTOP) powder; Apply topically 4 (four) times daily.  Dispense: 15 g; Refill: 0 - nystatin cream (MYCOSTATIN); Apply 1 application topically 2 (two) times daily.  Dispense: 30 g; Refill: 0   Labs pending Health Maintenance reviewed Diet and exercise encouraged  Follow up plan: 3 months    Evelina Dun, FNP

## 2017-12-04 LAB — MICROALBUMIN / CREATININE URINE RATIO
CREATININE, UR: 127 mg/dL
MICROALB/CREAT RATIO: 2.4 mg/g{creat} (ref 0.0–30.0)
MICROALBUM., U, RANDOM: 3 ug/mL

## 2017-12-14 ENCOUNTER — Other Ambulatory Visit: Payer: Self-pay | Admitting: Family

## 2017-12-18 ENCOUNTER — Other Ambulatory Visit: Payer: Self-pay | Admitting: Family

## 2017-12-24 DIAGNOSIS — M47816 Spondylosis without myelopathy or radiculopathy, lumbar region: Secondary | ICD-10-CM | POA: Diagnosis not present

## 2017-12-24 DIAGNOSIS — M546 Pain in thoracic spine: Secondary | ICD-10-CM | POA: Diagnosis not present

## 2017-12-24 DIAGNOSIS — M545 Low back pain: Secondary | ICD-10-CM | POA: Diagnosis not present

## 2017-12-25 DIAGNOSIS — M150 Primary generalized (osteo)arthritis: Secondary | ICD-10-CM | POA: Diagnosis not present

## 2017-12-25 DIAGNOSIS — M0609 Rheumatoid arthritis without rheumatoid factor, multiple sites: Secondary | ICD-10-CM | POA: Diagnosis not present

## 2017-12-25 DIAGNOSIS — M2550 Pain in unspecified joint: Secondary | ICD-10-CM | POA: Diagnosis not present

## 2017-12-25 DIAGNOSIS — Z79899 Other long term (current) drug therapy: Secondary | ICD-10-CM | POA: Diagnosis not present

## 2017-12-30 DIAGNOSIS — M150 Primary generalized (osteo)arthritis: Secondary | ICD-10-CM | POA: Diagnosis not present

## 2017-12-30 DIAGNOSIS — M0609 Rheumatoid arthritis without rheumatoid factor, multiple sites: Secondary | ICD-10-CM | POA: Diagnosis not present

## 2017-12-30 DIAGNOSIS — M2550 Pain in unspecified joint: Secondary | ICD-10-CM | POA: Diagnosis not present

## 2017-12-30 DIAGNOSIS — Z79899 Other long term (current) drug therapy: Secondary | ICD-10-CM | POA: Diagnosis not present

## 2018-01-21 DIAGNOSIS — M546 Pain in thoracic spine: Secondary | ICD-10-CM | POA: Diagnosis not present

## 2018-01-21 DIAGNOSIS — M545 Low back pain: Secondary | ICD-10-CM | POA: Diagnosis not present

## 2018-01-21 DIAGNOSIS — M47816 Spondylosis without myelopathy or radiculopathy, lumbar region: Secondary | ICD-10-CM | POA: Diagnosis not present

## 2018-02-04 DIAGNOSIS — M0609 Rheumatoid arthritis without rheumatoid factor, multiple sites: Secondary | ICD-10-CM | POA: Diagnosis not present

## 2018-02-10 DIAGNOSIS — M19131 Post-traumatic osteoarthritis, right wrist: Secondary | ICD-10-CM | POA: Diagnosis not present

## 2018-02-14 ENCOUNTER — Other Ambulatory Visit: Payer: Self-pay | Admitting: Family

## 2018-02-14 DIAGNOSIS — A6002 Herpesviral infection of other male genital organs: Secondary | ICD-10-CM

## 2018-02-14 DIAGNOSIS — J069 Acute upper respiratory infection, unspecified: Secondary | ICD-10-CM

## 2018-02-18 DIAGNOSIS — M546 Pain in thoracic spine: Secondary | ICD-10-CM | POA: Diagnosis not present

## 2018-02-18 DIAGNOSIS — M545 Low back pain: Secondary | ICD-10-CM | POA: Diagnosis not present

## 2018-02-18 DIAGNOSIS — M47816 Spondylosis without myelopathy or radiculopathy, lumbar region: Secondary | ICD-10-CM | POA: Diagnosis not present

## 2018-03-04 DIAGNOSIS — M0609 Rheumatoid arthritis without rheumatoid factor, multiple sites: Secondary | ICD-10-CM | POA: Diagnosis not present

## 2018-03-07 ENCOUNTER — Ambulatory Visit: Payer: BLUE CROSS/BLUE SHIELD | Admitting: Family

## 2018-03-07 ENCOUNTER — Encounter: Payer: Self-pay | Admitting: Family

## 2018-03-07 VITALS — BP 133/81 | HR 73 | Temp 98.1°F | Ht 70.0 in | Wt 248.0 lb

## 2018-03-07 DIAGNOSIS — E1165 Type 2 diabetes mellitus with hyperglycemia: Secondary | ICD-10-CM | POA: Diagnosis not present

## 2018-03-07 DIAGNOSIS — I10 Essential (primary) hypertension: Secondary | ICD-10-CM

## 2018-03-07 DIAGNOSIS — E1159 Type 2 diabetes mellitus with other circulatory complications: Secondary | ICD-10-CM

## 2018-03-07 DIAGNOSIS — M059 Rheumatoid arthritis with rheumatoid factor, unspecified: Secondary | ICD-10-CM

## 2018-03-07 DIAGNOSIS — E1169 Type 2 diabetes mellitus with other specified complication: Secondary | ICD-10-CM

## 2018-03-07 DIAGNOSIS — I152 Hypertension secondary to endocrine disorders: Secondary | ICD-10-CM

## 2018-03-07 DIAGNOSIS — E785 Hyperlipidemia, unspecified: Secondary | ICD-10-CM

## 2018-03-07 DIAGNOSIS — B372 Candidiasis of skin and nail: Secondary | ICD-10-CM

## 2018-03-07 LAB — CMP14+EGFR
A/G RATIO: 2 (ref 1.2–2.2)
ALK PHOS: 66 IU/L (ref 39–117)
ALT: 18 IU/L (ref 0–44)
AST: 17 IU/L (ref 0–40)
Albumin: 4.5 g/dL (ref 3.6–4.8)
BILIRUBIN TOTAL: 0.2 mg/dL (ref 0.0–1.2)
BUN/Creatinine Ratio: 16 (ref 10–24)
BUN: 19 mg/dL (ref 8–27)
CHLORIDE: 101 mmol/L (ref 96–106)
CO2: 20 mmol/L (ref 20–29)
Calcium: 9.6 mg/dL (ref 8.6–10.2)
Creatinine, Ser: 1.19 mg/dL (ref 0.76–1.27)
GFR calc non Af Amer: 64 mL/min/{1.73_m2} (ref >59)
GFR, EST AFRICAN AMERICAN: 74 mL/min/{1.73_m2} (ref >59)
GLUCOSE: 96 mg/dL (ref 65–99)
Globulin, Total: 2.3 g/dL (ref 1.5–4.5)
POTASSIUM: 4.5 mmol/L (ref 3.5–5.2)
Sodium: 140 mmol/L (ref 134–144)
TOTAL PROTEIN: 6.8 g/dL (ref 6.0–8.5)

## 2018-03-07 LAB — BAYER DCA HB A1C WAIVED: HB A1C: 7.1 % — AB (ref <7.0)

## 2018-03-07 MED ORDER — KETOCONAZOLE 2 % EX CREA: 1.0000 "application " | TOPICAL_CREAM | Freq: Every day | CUTANEOUS | 3 refills | Status: DC

## 2018-03-07 NOTE — Progress Notes (Signed)
Subjective:    Patient ID: [name], male    DOB: [birth year], 62 y.o.   MRN: [id number]  Chief Complaint  Patient presents with  . Medical Management of Chronic Issues   PT presents to the office today for chronic follow up. Pt has rheumatoid arthritis and followed by rheumatologists every 3 months.Pt is followed by Cardiologists annually. Diabetes  He presents for his follow-up diabetic visit. He has type 2 diabetes mellitus. His disease course has been stable. There are no hypoglycemic associated symptoms. Pertinent negatives for diabetes include no blurred vision, no foot paresthesias and no visual change. There are no hypoglycemic complications. Symptoms are stable. Risk factors for coronary artery disease include dyslipidemia, diabetes mellitus, hypertension and male sex. His overall blood glucose range is 110-130 mg/dl. Eye exam is current.  Hypertension  This is a chronic problem. The current episode started more than 1 year ago. The problem has been resolved since onset. The problem is controlled. Associated symptoms include malaise/fatigue. Pertinent negatives include no blurred vision, peripheral edema or shortness of breath. Risk factors for coronary artery disease include dyslipidemia, diabetes mellitus, male gender, obesity and sedentary lifestyle. The current treatment provides moderate improvement. Hypertensive end-organ damage includes CAD/MI.  Hyperlipidemia  This is a chronic problem. The current episode started more than 1 year ago. The problem is controlled. Recent lipid tests were reviewed and are normal. Exacerbating diseases include obesity. Pertinent negatives include no shortness of breath. Current antihyperlipidemic treatment includes statins. The current treatment provides moderate improvement of lipids. Risk factors for coronary artery disease include diabetes mellitus, hypertension, male sex, a sedentary lifestyle and dyslipidemia.  Arthritis  Presents for  follow-up visit. He complains of pain and stiffness. The symptoms have been worsening. Affected locations include the right MCP, left MCP, right knee and left knee. His pain is at a severity of 6/10. Associated symptoms include rash.  Rash  This is a recurrent problem. The current episode started more than 1 month ago. The problem has been waxing and waning since onset. The affected locations include the right axilla. The rash is characterized by itchiness and redness. Pertinent negatives include no shortness of breath.      Review of Systems  Constitutional: Positive for malaise/fatigue.  Eyes: Negative for blurred vision.  Respiratory: Negative for shortness of breath.   Musculoskeletal: Positive for arthritis and stiffness.  Skin: Positive for rash.  All other systems reviewed and are negative.      Objective:   Physical Exam  Constitutional: He is oriented to person, place, and time. He appears well-developed and well-nourished. No distress.  HENT:  Head: Normocephalic.  Right Ear: External ear normal.  Left Ear: External ear normal.  Mouth/Throat: Oropharynx is clear and moist.  Eyes: Pupils are equal, round, and reactive to light. Right eye exhibits no discharge. Left eye exhibits no discharge.  Neck: Normal range of motion. Neck supple. No thyromegaly present.  Cardiovascular: Normal rate, regular rhythm, normal heart sounds and intact distal pulses.  No murmur heard. Pulmonary/Chest: Effort normal and breath sounds normal. No respiratory distress. He has no wheezes.  Abdominal: Soft. Bowel sounds are normal. He exhibits no distension. There is no tenderness.  Musculoskeletal: He exhibits no edema or tenderness.  Stiff movement of lumbar, bilateral hands, and neck and decrease ROM of right wrist.   Neurological: He is alert and oriented to person, place, and time. He has normal reflexes. No cranial nerve deficit.  Skin: Skin is warm and dry. Rash  noted. No erythema.    Erythemas, cracked yeast rash under right axilla  Psychiatric: He has a normal mood and affect. His behavior is normal. Judgment and thought content normal.  Vitals reviewed.     BP 133/81   Pulse 73   Temp 98.1 F (36.7 C) (Oral)   Ht 5' 10"  (1.778 m)   Wt 248 lb (112.5 kg)   BMI 35.58 kg/m      Assessment & Plan:  [name] comes in today with chief complaint of Medical Management of Chronic Issues   Diagnosis and orders addressed:  1. Hypertension associated with diabetes ([place]) - CMP14+EGFR  2. Type 2 diabetes mellitus with hyperglycemia, without long-term current use of insulin (HCC) - Bayer DCA Hb A1c Waived - CMP14+EGFR  3. Hyperlipidemia associated with type 2 diabetes mellitus (HCC) - CMP14+EGFR  4. Rheumatoid arthritis with positive rheumatoid factor, involving unspecified site ([place]) - CMP14+EGFR  5. Morbid obesity ([place]) - CMP14+EGFR  6. Yeast infection of the skin Start Ketoconazole  Keep clean and dry - ketoconazole (NIZORAL) 2 % cream; Apply 1 application topically daily.  Dispense: 15 g; Refill: 3 - CMP14+EGFR   Labs pending Health Maintenance reviewed Diet and exercise encouraged  Follow up plan: 3 months    Evelina Dun, FNP

## 2018-03-07 NOTE — Patient Instructions (Signed)
Skin Yeast Infection Skin yeast infection is a condition in which there is an overgrowth of yeast (candida) that normally lives on the skin. This condition usually occurs in areas of the skin that are constantly warm and moist, such as the armpits or the groin. What are the causes? This condition is caused by a change in the normal balance of the yeast and bacteria that live on the skin. What increases the risk? This condition is more likely to develop in:  People who are obese.  Pregnant women.  Women who take birth control pills.  People who have diabetes.  People who take antibiotic medicines.  People who take steroid medicines.  People who are malnourished.  People who have a weak defense (immune) system.  People who are 65 years of age or older.  What are the signs or symptoms? Symptoms of this condition include:  A red, swollen area of the skin.  Bumps on the skin.  Itchiness.  How is this diagnosed? This condition is diagnosed with a medical history and physical exam. Your health care provider may check for yeast by taking light scrapings of the skin to be viewed under a microscope. How is this treated? This condition is treated with medicine. Medicines may be prescribed or be available over-the-counter. The medicines may be:  Taken by mouth (orally).  Applied as a cream.  Follow these instructions at home:  Take or apply over-the-counter and prescription medicines only as told by your health care provider.  Eat more yogurt. This may help to keep your yeast infection from returning.  Maintain a healthy weight. If you need help losing weight, talk with your health care provider.  Keep your skin clean and dry.  If you have diabetes, keep your blood sugar under control. Contact a health care provider if:  Your symptoms go away and then return.  Your symptoms do not get better with treatment.  Your symptoms get worse.  Your rash spreads.  You have a  fever or chills.  You have new symptoms.  You have new warmth or redness of your skin. This information is not intended to replace advice given to you by your health care provider. Make sure you discuss any questions you have with your health care provider. Document Released: 12/12/2010 Document Revised: 11/20/2015 Document Reviewed: 09/27/2014 Elsevier Interactive Patient Education  2018 [employer]  

## 2018-03-13 ENCOUNTER — Other Ambulatory Visit: Payer: Self-pay | Admitting: Family

## 2018-03-13 DIAGNOSIS — M79642 Pain in left hand: Secondary | ICD-10-CM | POA: Diagnosis not present

## 2018-03-13 DIAGNOSIS — M25551 Pain in right hip: Secondary | ICD-10-CM | POA: Diagnosis not present

## 2018-03-13 DIAGNOSIS — A6002 Herpesviral infection of other male genital organs: Secondary | ICD-10-CM

## 2018-03-13 DIAGNOSIS — M79641 Pain in right hand: Secondary | ICD-10-CM | POA: Diagnosis not present

## 2018-03-13 DIAGNOSIS — M19012 Primary osteoarthritis, left shoulder: Secondary | ICD-10-CM | POA: Diagnosis not present

## 2018-03-13 DIAGNOSIS — M25552 Pain in left hip: Secondary | ICD-10-CM | POA: Diagnosis not present

## 2018-03-14 ENCOUNTER — Other Ambulatory Visit: Payer: Self-pay | Admitting: Family

## 2018-03-14 DIAGNOSIS — J069 Acute upper respiratory infection, unspecified: Secondary | ICD-10-CM

## 2018-03-18 DIAGNOSIS — M47816 Spondylosis without myelopathy or radiculopathy, lumbar region: Secondary | ICD-10-CM | POA: Diagnosis not present

## 2018-03-18 DIAGNOSIS — M545 Low back pain: Secondary | ICD-10-CM | POA: Diagnosis not present

## 2018-03-18 DIAGNOSIS — M546 Pain in thoracic spine: Secondary | ICD-10-CM | POA: Diagnosis not present

## 2018-04-01 DIAGNOSIS — M0609 Rheumatoid arthritis without rheumatoid factor, multiple sites: Secondary | ICD-10-CM | POA: Diagnosis not present

## 2018-04-12 ENCOUNTER — Other Ambulatory Visit: Payer: Self-pay | Admitting: Family

## 2018-04-12 DIAGNOSIS — J069 Acute upper respiratory infection, unspecified: Secondary | ICD-10-CM

## 2018-04-15 DIAGNOSIS — M47816 Spondylosis without myelopathy or radiculopathy, lumbar region: Secondary | ICD-10-CM | POA: Diagnosis not present

## 2018-04-15 DIAGNOSIS — M545 Low back pain: Secondary | ICD-10-CM | POA: Diagnosis not present

## 2018-04-15 DIAGNOSIS — M546 Pain in thoracic spine: Secondary | ICD-10-CM | POA: Diagnosis not present

## 2018-04-24 DIAGNOSIS — E1165 Type 2 diabetes mellitus with hyperglycemia: Secondary | ICD-10-CM | POA: Diagnosis not present

## 2018-04-24 DIAGNOSIS — I10 Essential (primary) hypertension: Secondary | ICD-10-CM | POA: Diagnosis not present

## 2018-04-24 DIAGNOSIS — E7800 Pure hypercholesterolemia, unspecified: Secondary | ICD-10-CM | POA: Diagnosis not present

## 2018-04-29 DIAGNOSIS — M0609 Rheumatoid arthritis without rheumatoid factor, multiple sites: Secondary | ICD-10-CM | POA: Diagnosis not present

## 2018-05-06 NOTE — Progress Notes (Signed)
Patient ID: [name], male   DOB: 1955/08/13, 63 y.o.   MRN: [id number]   63 y.o. with CRF;s central obesity, DM and HTN  Quit smoking in 1998 has significant arthritis That limits his activity Had normal myovue 03/28/16 for atypical chest pain   Had right wrist surgery with [name] 03/14/17 with no cardiac issues.   Discussed weight loss and exercise with him Limited by some right hip pain and generalized arthritic issues   BP running too high   Saw Balin to adjust DM meds   ROS: Denies fever, malais, weight loss, blurry vision, decreased visual acuity, cough, sputum, SOB, hemoptysis, pleuritic pain, palpitaitons, heartburn, abdominal pain, melena, lower extremity edema, claudication, or rash.  All other systems reviewed and negative  General: Affect appropriate Obese HEENT: normal Neck supple with no adenopathy JVP normal no bruits no thyromegaly Lungs clear with no wheezing and good diaphragmatic motion Heart:  S1/S2 no murmur, no rub, gallop or click PMI normal Abdomen: benighn, BS positve, no tenderness, no AAA no bruit.  No HSM or HJR Distal pulses intact with no bruits No edema Neuro non-focal Skin warm and dry Right wrist less mobility post surgery     Current Outpatient Medications  Medication Sig Dispense Refill  . abatacept in sodium chloride 0.9 % Inject into the vein every 30 (thirty) days. Reported on 06/27/2015    . aspirin EC 81 MG tablet Take 81 mg by mouth daily.    [name] glimepiride (AMARYL) 2 MG tablet     . hydrochlorothiazide (MICROZIDE) 12.5 MG capsule TAKE (1) CAPSULE DAILY 90 capsule 1  . ketoconazole (NIZORAL) 2 % cream Apply 1 application topically daily. 15 g 3  . losartan (COZAAR) 100 MG tablet TAKE 1 TABLET BY MOUTH  DAILY 90 tablet 3  . meloxicam (MOBIC) 15 MG tablet Take 15 mg by mouth daily.    . metFORMIN (GLUCOPHAGE) 1000 MG tablet TAKE 1 TABLET IN THE MORNING AND 1&1/2 TABLETS IN THE EVENING 75 tablet 0  . nystatin (MYCOSTATIN/NYSTOP) powder  Apply topically 4 (four) times daily. 15 g 0  . nystatin cream (MYCOSTATIN) Apply 1 application topically 2 (two) times daily. 30 g 0  . oxyCODONE-acetaminophen (PERCOCET) 10-325 MG tablet Take 1-2 tablets by mouth every 6 (six) hours as needed for pain. MAXIMUM TOTAL ACETAMINOPHEN DOSE IS 4000 MG PER DAY 50 tablet 0  . simvastatin (ZOCOR) 10 MG tablet TAKE 1 TABLET BY MOUTH  DAILY. PLEASE KEEP UPCOMING APPT FOR FUTURE REFILLS.  THANK YOU 90 tablet 3  . valACYclovir (VALTREX) 1000 MG tablet TAKE 1 TABLET DAILY 30 tablet 5  . amLODipine (NORVASC) 10 MG tablet Take 1 tablet (10 mg total) by mouth daily. 90 tablet 3   No current facility-administered medications for this visit.     Allergies  Other; Shellfish allergy; Leflunomide; Methotrexate; Doxycycline; Invokana [canagliflozin]; and Keflex [cephalexin]  Electrocardiogram:  05/09/18 SR rate 69 normal   Assessment and Plan HTN: continue ARB and diuretic Add norvasc 10 mg f/u primary  LS:[id number] low carb diet.  Target hemoglobin A1c is 6.5 or less.  Continue current medications. Arthritis:continue monthly abatacept with rheum screws no out of right wrist  Cholesterol is at goal.  Continue current dose of statin and diet Rx.  No myalgias or side effects.  F/U  LFT's in 6 months. Lab Results  Component Value Date   LDLCALC 76 12/03/2017   Chest Pain :  Atypical abnormal ECG normal myovue 2017 observe  F/u with me in a year    Jenkins Rouge

## 2018-05-09 ENCOUNTER — Ambulatory Visit: Payer: BLUE CROSS/BLUE SHIELD | Admitting: Cardiovascular Disease

## 2018-05-09 VITALS — BP 152/80 | HR 69 | Ht 70.0 in | Wt 252.0 lb

## 2018-05-09 DIAGNOSIS — I10 Essential (primary) hypertension: Secondary | ICD-10-CM | POA: Diagnosis not present

## 2018-05-09 DIAGNOSIS — R0789 Other chest pain: Secondary | ICD-10-CM

## 2018-05-09 DIAGNOSIS — E785 Hyperlipidemia, unspecified: Secondary | ICD-10-CM

## 2018-05-09 MED ORDER — AMLODIPINE BESYLATE 10 MG PO TABS: 10.0000 mg | ORAL_TABLET | Freq: Every day | ORAL | 3 refills | Status: DC

## 2018-05-09 NOTE — Patient Instructions (Addendum)
Medication Instructions:  Your physician has recommended you make the following change in your medication:  1-START Norvasc 10 mg by mouth daily.  If you need a refill on your cardiac medications before your next appointment, please call your pharmacy.   Lab work:  If you have labs (blood work) drawn today and your tests are completely normal, you will receive your results only by: [name] MyChart Message (if you have MyChart) OR . A paper copy in the mail If you have any lab test that is abnormal or we need to change your treatment, we will call you to review the results.  Testing/Procedures: None ordered today.  Follow-Up: At [facility], you and your health needs are our priority.  As part of our continuing mission to provide you with exceptional heart care, we have created designated Provider Care Teams.  These Care Teams include your primary Cardiologist (physician) and Advanced Practice Providers (APPs -  Physician Assistants and Nurse Practitioners) who all work together to provide you with the care you need, when you need it. You will need a follow up appointment in 12 months.  Please call our office 2 months in advance to schedule this appointment.  You may see Jenkins Rouge, MD or one of the following Advanced Practice Providers on your designated Care Team:   Truitt Merle, NP Cecilie Kicks, NP . Kathyrn Drown, NP

## 2018-05-10 ENCOUNTER — Other Ambulatory Visit: Payer: Self-pay | Admitting: Family

## 2018-05-10 DIAGNOSIS — J069 Acute upper respiratory infection, unspecified: Secondary | ICD-10-CM

## 2018-05-13 ENCOUNTER — Other Ambulatory Visit: Payer: Self-pay | Admitting: Family

## 2018-05-13 DIAGNOSIS — J069 Acute upper respiratory infection, unspecified: Secondary | ICD-10-CM

## 2018-05-14 ENCOUNTER — Other Ambulatory Visit: Payer: Self-pay | Admitting: Cardiovascular Disease

## 2018-05-29 ENCOUNTER — Ambulatory Visit: Payer: BLUE CROSS/BLUE SHIELD | Admitting: Family Medicine

## 2018-05-29 ENCOUNTER — Encounter: Payer: Self-pay | Admitting: Family Medicine

## 2018-05-29 VITALS — BP 129/75 | HR 71 | Temp 97.5°F | Ht 70.0 in | Wt 252.0 lb

## 2018-05-29 DIAGNOSIS — G629 Polyneuropathy, unspecified: Secondary | ICD-10-CM

## 2018-05-29 DIAGNOSIS — B372 Candidiasis of skin and nail: Secondary | ICD-10-CM

## 2018-05-29 MED ORDER — PREDNISONE 20 MG PO TABS: ORAL_TABLET | ORAL | 0 refills | Status: DC

## 2018-05-29 MED ORDER — FLUCONAZOLE 150 MG PO TABS: 150.0000 mg | ORAL_TABLET | Freq: Once | ORAL | 0 refills | Status: AC

## 2018-05-29 NOTE — Progress Notes (Signed)
BP 129/75   Pulse 71   Temp (!) 97.5 F (36.4 C) (Oral)   Ht 5\' 10"  (1.778 m)   Wt 252 lb (114.3 kg)   BMI 36.16 kg/m    Subjective:    Patient ID: [name], male    DOB: 1955-09-04, 63 y.o.   MRN: [id number]  HPI: [name] is a 63 y.o. male presenting on 05/29/2018 for Leg Pain   HPI Burning sensation on his lateral right lower leg Patient comes in complaining of sensation of his right lateral lower leg is been going on over the past 1 week.  He denies any skin changes, it is a very small patch on the lateral leg but he feels the burning.  He does admit to chronic back pains but does not admit to any recent trauma or changes or recent surgeries that cause this to rise.  He denies any burning or pain anywhere else in his leg except that right lateral lower leg.  He has been keeping an eye on his sugars and they have been doing better for the most part.  He checks it every now and then.  Patient is coming in still complaining of a rash under both axilla that is irritated and very pruritic and has been using an antifungal cream for it which is helped but is not getting rid of it and he would like to see if there is something stronger that he could do for it.  Relevant past medical, surgical, family and social history reviewed and updated as indicated. Interim medical history since our last visit reviewed. Allergies and medications reviewed and updated.  Review of Systems  Constitutional: Negative for chills and fever.  Eyes: Negative for visual disturbance.  Respiratory: Negative for shortness of breath and wheezing.   Cardiovascular: Negative for chest pain and leg swelling.  Musculoskeletal: Negative for back pain and gait problem.  Skin: Negative for rash.  Neurological: Positive for numbness. Negative for weakness.  All other systems reviewed and are negative.   Per HPI unless specifically indicated above        Objective:    BP 129/75   Pulse 71   Temp  (!) 97.5 F (36.4 C) (Oral)   Ht 5\' 10"  (1.778 m)   Wt 252 lb (114.3 kg)   BMI 36.16 kg/m   Wt Readings from Last 3 Encounters:  05/29/18 252 lb (114.3 kg)  05/09/18 252 lb (114.3 kg)  03/07/18 248 lb (112.5 kg)    Physical Exam Vitals signs and nursing note reviewed.  Constitutional:      General: He is not in acute distress.    Appearance: He is well-developed. He is not diaphoretic.  Eyes:     General: No scleral icterus.    Conjunctiva/sclera: Conjunctivae normal.  Neck:     Thyroid: No thyromegaly.  Musculoskeletal: Normal range of motion.  Skin:    General: Skin is warm and dry.     Findings: Rash (Pink plaque with a slight scale excoriated, consistent with probable dermatitis) present.  Neurological:     Mental Status: He is alert and oriented to person, place, and time.     Coordination: Coordination normal.     Comments: Partial numbness in a 3 x 7 cm patch on his right lateral lower leg, no overlying skin changes  Psychiatric:        Behavior: Behavior normal.         Assessment & Plan:   Problem  List Items Addressed This Visit    None    Visit Diagnoses    Neuropathy    -  Primary   Very specific location on right lateral calf with neuropathic burning sensation   Relevant Medications   predniSONE (DELTASONE) 20 MG tablet   Yeast dermatitis       Relevant Medications   fluconazole (DIFLUCAN) 150 MG tablet       Follow up plan: Return if symptoms worsen or fail to improve.  Counseling provided for all of the vaccine components No orders of the defined types were placed in this encounter.   Caryl Pina, MD [facility] 05/29/2018, 4:21 PM

## 2018-06-02 DIAGNOSIS — M0609 Rheumatoid arthritis without rheumatoid factor, multiple sites: Secondary | ICD-10-CM | POA: Diagnosis not present

## 2018-06-06 ENCOUNTER — Encounter: Payer: Self-pay | Admitting: Family Medicine

## 2018-06-06 ENCOUNTER — Ambulatory Visit: Payer: BLUE CROSS/BLUE SHIELD | Admitting: Family Medicine

## 2018-06-06 VITALS — BP 145/79 | HR 75 | Temp 97.3°F | Ht 70.0 in | Wt 249.4 lb

## 2018-06-06 DIAGNOSIS — R05 Cough: Secondary | ICD-10-CM | POA: Diagnosis not present

## 2018-06-06 DIAGNOSIS — R053 Chronic cough: Secondary | ICD-10-CM

## 2018-06-06 MED ORDER — BENZONATATE 100 MG PO CAPS: 100.0000 mg | ORAL_CAPSULE | Freq: Two times a day (BID) | ORAL | 1 refills | Status: DC

## 2018-06-06 NOTE — Progress Notes (Signed)
BP (!) 145/79   Pulse 75   Temp (!) 97.3 F (36.3 C) (Oral)   Ht 5\' 10"  (1.778 m)   Wt 113.1 kg   SpO2 96%   BMI 35.79 kg/m    Subjective:    Patient ID: [name], male    DOB: 28-Mar-1956, 62 y.o.   MRN: [id number]  HPI: [name] is a 63 y.o. male presenting on 06/06/2018 for Cough (x 2 months)   HPI Pt is a 63 y/o male presenting for 2 months of persistent cough.  He had a cold that began after Christmas. Other cold symptoms have subsided, but his cough has lingered. He states the cough is worse when he lays down and in the mornings. He reports he has strong coughing fits that make him feel like he is going to pass out. Pt reports he has taken Tylenol and Delsym with minimal relief.  Pt also reports hoarseness due to the cough. He denies allergies and acid reflux. He denies fevers, but says he did feel warm yesterday. Pt is a former smoker, but quit in 1992 and has not had chronic bronchitis, and has never needed inhalers.  Relevant past medical, surgical, family and social history reviewed and updated as indicated. Interim medical history since our last visit reviewed. Allergies and medications reviewed and updated.  Review of Systems  Constitutional: Negative for chills, fatigue and fever.  HENT: Positive for voice change (hoarseness). Negative for congestion, sinus pressure, sinus pain and sore throat.   Respiratory: Positive for cough. Negative for shortness of breath and wheezing.   Cardiovascular: Negative for chest pain.  Gastrointestinal: Negative for blood in stool, diarrhea, nausea and vomiting.  Genitourinary: Negative for hematuria.  Allergic/Immunologic: Negative for environmental allergies.    Per HPI unless specifically indicated above       Objective:    BP (!) 145/79   Pulse 75   Temp (!) 97.3 F (36.3 C) (Oral)   Ht 5\' 10"  (1.778 m)   Wt 113.1 kg   SpO2 96%   BMI 35.79 kg/m   Wt Readings from Last 3 Encounters:  06/06/18 113.1 kg    05/29/18 114.3 kg  05/09/18 114.3 kg    Physical Exam Constitutional:      General: He is not in acute distress.    Appearance: Normal appearance.  HENT:     Mouth/Throat:     Pharynx: Oropharynx is clear. No oropharyngeal exudate or posterior oropharyngeal erythema.     Tonsils: No tonsillar exudate.  Eyes:     General:        Right eye: No discharge.        Left eye: No discharge.     Conjunctiva/sclera: Conjunctivae normal.     Pupils: Pupils are equal, round, and reactive to light.  Neck:     Musculoskeletal: Neck supple. No muscular tenderness.  Cardiovascular:     Rate and Rhythm: Normal rate and regular rhythm.     Heart sounds: Normal heart sounds.  Pulmonary:     Effort: Pulmonary effort is normal. No respiratory distress.     Breath sounds: Normal breath sounds. No wheezing or rales.  Lymphadenopathy:     Cervical: No cervical adenopathy.  Neurological:     Mental Status: He is alert.  Psychiatric:        Mood and Affect: Mood normal.        Behavior: Behavior normal.       Assessment & Plan:  Problem List Items Addressed This Visit    None    Visit Diagnoses    Persistent cough    -  Primary   Relevant Medications   benzonatate (TESSALON PERLES) 100 MG capsule     Persistent Cough - Recommended pt take Zyrtec daily, in addition to [employer]  Follow up plan: Return if symptoms worsen or fail to improve.  Counseling provided for all of the vaccine components No orders of the defined types were placed in this encounter.   Johnney Killian PA-S [facility]  I was personally present for all components of the history, physical exam and/or medical decision making.  I agree with the documentation performed by the PA student and agree with assessment and plan above.  PA student was Johnney Killian. Caryl Pina, MD [facility] 06/10/2018, 9:33 PM

## 2018-06-11 ENCOUNTER — Other Ambulatory Visit: Payer: Self-pay | Admitting: Family

## 2018-06-11 DIAGNOSIS — J069 Acute upper respiratory infection, unspecified: Secondary | ICD-10-CM

## 2018-06-11 NOTE — Telephone Encounter (Signed)
[place]. NTBS 30 days given 05/14/18

## 2018-06-12 ENCOUNTER — Ambulatory Visit: Payer: BLUE CROSS/BLUE SHIELD | Admitting: Family

## 2018-07-03 DIAGNOSIS — Z79899 Other long term (current) drug therapy: Secondary | ICD-10-CM | POA: Diagnosis not present

## 2018-07-03 DIAGNOSIS — M0609 Rheumatoid arthritis without rheumatoid factor, multiple sites: Secondary | ICD-10-CM | POA: Diagnosis not present

## 2018-07-03 DIAGNOSIS — M25551 Pain in right hip: Secondary | ICD-10-CM | POA: Diagnosis not present

## 2018-07-03 DIAGNOSIS — M2550 Pain in unspecified joint: Secondary | ICD-10-CM | POA: Diagnosis not present

## 2018-07-03 DIAGNOSIS — M25552 Pain in left hip: Secondary | ICD-10-CM | POA: Diagnosis not present

## 2018-07-03 DIAGNOSIS — M19011 Primary osteoarthritis, right shoulder: Secondary | ICD-10-CM | POA: Diagnosis not present

## 2018-07-03 DIAGNOSIS — M150 Primary generalized (osteo)arthritis: Secondary | ICD-10-CM | POA: Diagnosis not present

## 2018-07-04 ENCOUNTER — Telehealth: Payer: Self-pay | Admitting: Family

## 2018-07-04 DIAGNOSIS — J069 Acute upper respiratory infection, unspecified: Secondary | ICD-10-CM

## 2018-07-04 NOTE — Telephone Encounter (Signed)
A1C was 7.1 last time. Can we do phone visit?

## 2018-07-04 NOTE — Telephone Encounter (Signed)
Yes, ok to schedule for telephone since he is immunosuppressed. Will plan to bring him in for lab work when all this calms down.

## 2018-07-04 NOTE — Telephone Encounter (Signed)
Patient has a follow up appointment scheduled. 

## 2018-07-07 DIAGNOSIS — M0609 Rheumatoid arthritis without rheumatoid factor, multiple sites: Secondary | ICD-10-CM | POA: Diagnosis not present

## 2018-07-11 ENCOUNTER — Other Ambulatory Visit: Payer: Self-pay

## 2018-07-11 ENCOUNTER — Encounter: Payer: Self-pay | Admitting: Family

## 2018-07-11 ENCOUNTER — Ambulatory Visit (INDEPENDENT_AMBULATORY_CARE_PROVIDER_SITE_OTHER): Payer: BLUE CROSS/BLUE SHIELD | Admitting: Family

## 2018-07-11 DIAGNOSIS — I152 Hypertension secondary to endocrine disorders: Secondary | ICD-10-CM

## 2018-07-11 DIAGNOSIS — M059 Rheumatoid arthritis with rheumatoid factor, unspecified: Secondary | ICD-10-CM | POA: Diagnosis not present

## 2018-07-11 DIAGNOSIS — J069 Acute upper respiratory infection, unspecified: Secondary | ICD-10-CM

## 2018-07-11 DIAGNOSIS — E1159 Type 2 diabetes mellitus with other circulatory complications: Secondary | ICD-10-CM | POA: Diagnosis not present

## 2018-07-11 DIAGNOSIS — E1169 Type 2 diabetes mellitus with other specified complication: Secondary | ICD-10-CM

## 2018-07-11 DIAGNOSIS — E1165 Type 2 diabetes mellitus with hyperglycemia: Secondary | ICD-10-CM | POA: Diagnosis not present

## 2018-07-11 DIAGNOSIS — I10 Essential (primary) hypertension: Secondary | ICD-10-CM

## 2018-07-11 DIAGNOSIS — E785 Hyperlipidemia, unspecified: Secondary | ICD-10-CM

## 2018-07-11 MED ORDER — METFORMIN HCL 1000 MG PO TABS: ORAL_TABLET | ORAL | 3 refills | Status: DC

## 2018-07-11 NOTE — Progress Notes (Signed)
Virtual Visit via telephone Note  I connected with [name] on 07/11/18 at 11:17AM by telephone and verified that I am speaking with the correct person using two identifiers. [name] is currently located at outside and no one is currently with her during visit. The provider, Evelina Dun, FNP is located in their office at time of visit.  I discussed the limitations, risks, security and privacy concerns of performing an evaluation and management service by telephone and the availability of in person appointments. I also discussed with the patient that there may be a patient responsible charge related to this service. The patient expressed understanding and agreed to proceed.   History and Present Illness:  PT calls the office today for chronic follow up. Pt has rheumatoid arthritis and followed by rheumatologists every 3 months.Pt is followed by Cardiologists annually. Diabetes  He presents for his follow-up diabetic visit. He has type 2 diabetes mellitus. His disease course has been stable. There are no hypoglycemic associated symptoms. Pertinent negatives for diabetes include no blurred vision, no foot paresthesias and no visual change. There are no hypoglycemic complications. Symptoms are stable. Diabetic complications include heart disease. Risk factors for coronary artery disease include dyslipidemia, diabetes mellitus, male sex, hypertension and sedentary lifestyle. He is following a generally unhealthy diet. His overall blood glucose range is 110-130 mg/dl. Eye exam is current.  Hypertension  This is a chronic problem. The current episode started more than 1 year ago. The problem has been waxing and waning since onset. The problem is uncontrolled (144/82, on 07/07/18). Associated symptoms include malaise/fatigue. Pertinent negatives include no blurred vision, peripheral edema or shortness of breath. Risk factors for coronary artery disease include dyslipidemia, diabetes  mellitus, obesity and male gender. The current treatment provides moderate improvement.  Arthritis  Presents for follow-up visit. He complains of pain, stiffness, joint swelling and joint warmth. Affected locations include the right MCP, left MCP, right knee and left knee. His pain is at a severity of 5/10.  Hyperlipidemia  This is a chronic problem. The current episode started more than 1 year ago. The problem is uncontrolled. Recent lipid tests were reviewed and are normal. Exacerbating diseases include obesity. Pertinent negatives include no shortness of breath. Current antihyperlipidemic treatment includes statins. The current treatment provides moderate improvement of lipids. Risk factors for coronary artery disease include diabetes mellitus, dyslipidemia, male sex, hypertension and a sedentary lifestyle.      Review of Systems  Constitutional: Positive for malaise/fatigue.  Eyes: Negative for blurred vision.  Respiratory: Negative for shortness of breath.   Musculoskeletal: Positive for arthritis, joint swelling and stiffness.       Observations/Objective: No SOB or distress noted  Assessment and Plan: [name] comes in today with chief complaint of No chief complaint on file.   Diagnosis and orders addressed:  1. Type 2 diabetes mellitus with hyperglycemia, without long-term current use of insulin ([name])  2. Hypertension associated with diabetes ([name])  3. Morbid obesity ([name])  4. Rheumatoid arthritis with positive rheumatoid factor, involving unspecified site ([name])   5. Hyperlipidemia associated with type 2 diabetes mellitus ([name])  6. Viral upper respiratory tract infection - metFORMIN (GLUCOPHAGE) 1000 MG tablet; TAKE 1 TABLET IN THE MORNING AND 1&1/2 TABLETS IN THE EVENING  Dispense: 75 tablet; Refill: 3   Labs reviewed, and will hold off on lab work today as he takes immune suppressants. Will get in 3-4 months Health Maintenance reviewed Diet and exercise  encouraged RTO  3-4 months    I discussed the assessment and treatment plan with the patient. The patient was provided an opportunity to ask questions and all were answered. The patient agreed with the plan and demonstrated an understanding of the instructions.   The patient was advised to call back or seek an in-person evaluation if the symptoms worsen or if the condition fails to improve as anticipated.  The above assessment and management plan was discussed with the patient. The patient verbalized understanding of and has agreed to the management plan. Patient is aware to call the clinic if symptoms persist or worsen. Patient is aware when to return to the clinic for a follow-up visit. Patient educated on when it is appropriate to go to the emergency department.    Call need 11:30AM, I provided 13 minutes of non-face-to-face time during this encounter.    Evelina Dun, FNP

## 2018-07-14 ENCOUNTER — Ambulatory Visit: Payer: BLUE CROSS/BLUE SHIELD | Admitting: Family

## 2018-07-29 ENCOUNTER — Ambulatory Visit: Payer: BLUE CROSS/BLUE SHIELD | Admitting: Family

## 2018-08-04 DIAGNOSIS — M0609 Rheumatoid arthritis without rheumatoid factor, multiple sites: Secondary | ICD-10-CM | POA: Diagnosis not present

## 2018-08-11 ENCOUNTER — Other Ambulatory Visit: Payer: Self-pay

## 2018-08-12 ENCOUNTER — Ambulatory Visit: Payer: BLUE CROSS/BLUE SHIELD | Admitting: Family

## 2018-08-12 ENCOUNTER — Encounter: Payer: Self-pay | Admitting: Family

## 2018-08-12 VITALS — BP 132/75 | HR 72 | Temp 97.4°F | Ht 70.0 in | Wt 251.0 lb

## 2018-08-12 DIAGNOSIS — M059 Rheumatoid arthritis with rheumatoid factor, unspecified: Secondary | ICD-10-CM

## 2018-08-12 DIAGNOSIS — E1159 Type 2 diabetes mellitus with other circulatory complications: Secondary | ICD-10-CM | POA: Diagnosis not present

## 2018-08-12 DIAGNOSIS — I152 Hypertension secondary to endocrine disorders: Secondary | ICD-10-CM

## 2018-08-12 DIAGNOSIS — E1169 Type 2 diabetes mellitus with other specified complication: Secondary | ICD-10-CM | POA: Diagnosis not present

## 2018-08-12 DIAGNOSIS — E785 Hyperlipidemia, unspecified: Secondary | ICD-10-CM

## 2018-08-12 DIAGNOSIS — Z1212 Encounter for screening for malignant neoplasm of rectum: Secondary | ICD-10-CM

## 2018-08-12 DIAGNOSIS — Z1211 Encounter for screening for malignant neoplasm of colon: Secondary | ICD-10-CM

## 2018-08-12 DIAGNOSIS — I10 Essential (primary) hypertension: Secondary | ICD-10-CM

## 2018-08-12 DIAGNOSIS — E1165 Type 2 diabetes mellitus with hyperglycemia: Secondary | ICD-10-CM | POA: Diagnosis not present

## 2018-08-12 LAB — CBC WITH DIFFERENTIAL/PLATELET
Basophils Absolute: 0.1 10*3/uL (ref 0.0–0.2)
Basos: 1 %
EOS (ABSOLUTE): 0.8 10*3/uL — ABNORMAL HIGH (ref 0.0–0.4)
Eos: 9 %
Hematocrit: 32 % — ABNORMAL LOW (ref 37.5–51.0)
Hemoglobin: 9.8 g/dL — ABNORMAL LOW (ref 13.0–17.7)
Immature Grans (Abs): 0 10*3/uL (ref 0.0–0.1)
Immature Granulocytes: 0 %
Lymphocytes Absolute: 2.8 10*3/uL (ref 0.7–3.1)
Lymphs: 32 %
MCH: 22.7 pg — ABNORMAL LOW (ref 26.6–33.0)
MCHC: 30.6 g/dL — ABNORMAL LOW (ref 31.5–35.7)
MCV: 74 fL — ABNORMAL LOW (ref 79–97)
Monocytes Absolute: 0.6 10*3/uL (ref 0.1–0.9)
Monocytes: 7 %
Neutrophils Absolute: 4.3 10*3/uL (ref 1.4–7.0)
Neutrophils: 51 %
Platelets: 326 10*3/uL (ref 150–450)
RBC: 4.31 x10E6/uL (ref 4.14–5.80)
RDW: 15.8 % — ABNORMAL HIGH (ref 11.6–15.4)
WBC: 8.6 10*3/uL (ref 3.4–10.8)

## 2018-08-12 LAB — CMP14+EGFR
ALT: 22 IU/L (ref 0–44)
AST: 25 IU/L (ref 0–40)
Albumin/Globulin Ratio: 1.7 (ref 1.2–2.2)
Albumin: 4.3 g/dL (ref 3.8–4.8)
Alkaline Phosphatase: 56 IU/L (ref 39–117)
BUN/Creatinine Ratio: 14 (ref 10–24)
BUN: 15 mg/dL (ref 8–27)
Bilirubin Total: 0.2 mg/dL (ref 0.0–1.2)
CO2: 21 mmol/L (ref 20–29)
Calcium: 9.7 mg/dL (ref 8.6–10.2)
Chloride: 101 mmol/L (ref 96–106)
Creatinine, Ser: 1.09 mg/dL (ref 0.76–1.27)
GFR calc Af Amer: 82 mL/min/{1.73_m2} (ref >59)
GFR calc non Af Amer: 71 mL/min/{1.73_m2} (ref >59)
Globulin, Total: 2.5 g/dL (ref 1.5–4.5)
Glucose: 130 mg/dL — ABNORMAL HIGH (ref 65–99)
Potassium: 4.5 mmol/L (ref 3.5–5.2)
Sodium: 138 mmol/L (ref 134–144)
Total Protein: 6.8 g/dL (ref 6.0–8.5)

## 2018-08-12 LAB — LIPID PANEL
Chol/HDL Ratio: 4.2 ratio (ref 0.0–5.0)
Cholesterol, Total: 123 mg/dL (ref 100–199)
HDL: 29 mg/dL — ABNORMAL LOW (ref >39)
LDL Calculated: 72 mg/dL (ref 0–99)
Triglycerides: 112 mg/dL (ref 0–149)
VLDL Cholesterol Cal: 22 mg/dL (ref 5–40)

## 2018-08-12 LAB — BAYER DCA HB A1C WAIVED: HB A1C (BAYER DCA - WAIVED): 6.9 % (ref <7.0)

## 2018-08-12 NOTE — Patient Instructions (Signed)

## 2018-08-12 NOTE — Progress Notes (Signed)
Subjective:    Patient ID: [name], male    DOB: [birth year], 63 y.o.   MRN: [id number]  Chief Complaint  Patient presents with  . Diabetes   PT presents to the office today for chronic follow up. Pt has rheumatoid arthritis and followed by rheumatologists every 3 months.Pt is followed by Cardiologists annually. Diabetes  He presents for his follow-up diabetic visit. He has type 2 diabetes mellitus. His disease course has been stable. There are no hypoglycemic associated symptoms. Pertinent negatives for diabetes include no blurred vision and no foot paresthesias. Symptoms are stable. Pertinent negatives for diabetic complications include no CVA or heart disease. Risk factors for coronary artery disease include diabetes mellitus, dyslipidemia, male sex, hypertension and sedentary lifestyle. He is following a generally unhealthy diet. His overall blood glucose range is 110-130 mg/dl. Eye exam is current.  Hypertension  This is a chronic problem. The current episode started more than 1 year ago. The problem has been resolved since onset. The problem is controlled. Associated symptoms include malaise/fatigue. Pertinent negatives include no blurred vision, peripheral edema or shortness of breath. Risk factors for coronary artery disease include dyslipidemia, obesity, male gender and sedentary lifestyle. The current treatment provides moderate improvement. There is no history of CVA.  Hyperlipidemia  This is a chronic problem. The current episode started more than 1 year ago. The problem is controlled. Recent lipid tests were reviewed and are normal. Exacerbating diseases include obesity. Pertinent negatives include no shortness of breath. Current antihyperlipidemic treatment includes statins. The current treatment provides moderate improvement of lipids. Risk factors for coronary artery disease include dyslipidemia, diabetes mellitus, male sex, hypertension and a sedentary lifestyle.  Arthritis   Presents for follow-up visit. He complains of pain and stiffness. The symptoms have been stable. Affected locations include the right knee, left knee, left MCP and right MCP. His pain is at a severity of 7/10.      Review of Systems  Constitutional: Positive for malaise/fatigue.  Eyes: Negative for blurred vision.  Respiratory: Negative for shortness of breath.   Musculoskeletal: Positive for arthralgias, arthritis, back pain and stiffness.  All other systems reviewed and are negative.      Objective:   Physical Exam Vitals signs reviewed.  Constitutional:      General: He is not in acute distress.    Appearance: He is well-developed.  HENT:     Head: Normocephalic.     Right Ear: Tympanic membrane normal.     Left Ear: Tympanic membrane normal.  Eyes:     General:        Right eye: No discharge.        Left eye: No discharge.     Pupils: Pupils are equal, round, and reactive to light.  Neck:     Musculoskeletal: Normal range of motion and neck supple.     Thyroid: No thyromegaly.  Cardiovascular:     Rate and Rhythm: Normal rate and regular rhythm.     Heart sounds: Normal heart sounds. No murmur.  Pulmonary:     Effort: Pulmonary effort is normal. No respiratory distress.     Breath sounds: Normal breath sounds. No wheezing.  Abdominal:     General: Bowel sounds are normal. There is no distension.     Palpations: Abdomen is soft.     Tenderness: There is no abdominal tenderness.  Musculoskeletal: Normal range of motion.        General: No tenderness.  Skin:  General: Skin is warm and dry.     Findings: No erythema or rash.  Neurological:     Mental Status: He is alert and oriented to person, place, and time.     Cranial Nerves: No cranial nerve deficit.     Deep Tendon Reflexes: Reflexes are normal and symmetric.  Psychiatric:        Behavior: Behavior normal.        Thought Content: Thought content normal.        Judgment: Judgment normal.       BP  132/75   Pulse 72   Temp (!) 97.4 F (36.3 C) (Oral)   Ht 5' 10"  (1.778 m)   Wt 251 lb (113.9 kg)   BMI 36.01 kg/m      Assessment & Plan:  [name] comes in today with chief complaint of Diabetes   Diagnosis and orders addressed:  1. Type 2 diabetes mellitus with hyperglycemia, without long-term current use of insulin ([facility]) - CMP14+EGFR - CBC with Differential/Platelet - Bayer DCA Hb A1c Waived  2. Hyperlipidemia associated with type 2 diabetes mellitus ([place]) - CMP14+EGFR - CBC with Differential/Platelet - Lipid panel  3. Hypertension associated with diabetes ([place]) - CMP14+EGFR - CBC with Differential/Platelet  4. Morbid obesity ([place]) - CMP14+EGFR - CBC with Differential/Platelet  5. Rheumatoid arthritis with positive rheumatoid factor, involving unspecified site ([place]) - CMP14+EGFR - CBC with Differential/Platelet  6. Colon cancer screening - Cologuard  7. Screening for malignant neoplasm of the rectum - Cologuard   Labs pending Health Maintenance reviewed Diet and exercise encouraged  Follow up plan: 3 months    Evelina Dun, FNP

## 2018-08-25 ENCOUNTER — Other Ambulatory Visit: Payer: Self-pay | Admitting: *Deleted

## 2018-08-25 ENCOUNTER — Other Ambulatory Visit: Payer: BLUE CROSS/BLUE SHIELD

## 2018-08-25 ENCOUNTER — Other Ambulatory Visit: Payer: Self-pay

## 2018-08-25 DIAGNOSIS — Z1211 Encounter for screening for malignant neoplasm of colon: Secondary | ICD-10-CM | POA: Diagnosis not present

## 2018-08-26 LAB — FECAL OCCULT BLOOD, IMMUNOCHEMICAL: Fecal Occult Bld: NEGATIVE

## 2018-09-02 DIAGNOSIS — M0609 Rheumatoid arthritis without rheumatoid factor, multiple sites: Secondary | ICD-10-CM | POA: Diagnosis not present

## 2018-09-06 ENCOUNTER — Other Ambulatory Visit: Payer: Self-pay | Admitting: Family

## 2018-09-30 DIAGNOSIS — M0609 Rheumatoid arthritis without rheumatoid factor, multiple sites: Secondary | ICD-10-CM | POA: Diagnosis not present

## 2018-10-01 DIAGNOSIS — M25531 Pain in right wrist: Secondary | ICD-10-CM | POA: Diagnosis not present

## 2018-10-01 DIAGNOSIS — M48061 Spinal stenosis, lumbar region without neurogenic claudication: Secondary | ICD-10-CM | POA: Diagnosis not present

## 2018-10-07 LAB — HM DIABETES EYE EXAM

## 2018-10-21 DIAGNOSIS — E7800 Pure hypercholesterolemia, unspecified: Secondary | ICD-10-CM | POA: Diagnosis not present

## 2018-10-21 DIAGNOSIS — I10 Essential (primary) hypertension: Secondary | ICD-10-CM | POA: Diagnosis not present

## 2018-10-21 DIAGNOSIS — E1165 Type 2 diabetes mellitus with hyperglycemia: Secondary | ICD-10-CM | POA: Diagnosis not present

## 2018-10-25 ENCOUNTER — Other Ambulatory Visit: Payer: Self-pay | Admitting: Family

## 2018-10-25 DIAGNOSIS — J069 Acute upper respiratory infection, unspecified: Secondary | ICD-10-CM

## 2018-10-28 DIAGNOSIS — M0609 Rheumatoid arthritis without rheumatoid factor, multiple sites: Secondary | ICD-10-CM | POA: Diagnosis not present

## 2018-11-12 DIAGNOSIS — D1801 Hemangioma of skin and subcutaneous tissue: Secondary | ICD-10-CM | POA: Diagnosis not present

## 2018-11-12 DIAGNOSIS — L821 Other seborrheic keratosis: Secondary | ICD-10-CM | POA: Diagnosis not present

## 2018-11-12 DIAGNOSIS — D2270 Melanocytic nevi of unspecified lower limb, including hip: Secondary | ICD-10-CM | POA: Diagnosis not present

## 2018-11-12 DIAGNOSIS — L219 Seborrheic dermatitis, unspecified: Secondary | ICD-10-CM | POA: Diagnosis not present

## 2018-11-18 ENCOUNTER — Ambulatory Visit: Payer: BLUE CROSS/BLUE SHIELD | Admitting: Family

## 2018-11-18 ENCOUNTER — Other Ambulatory Visit: Payer: Self-pay

## 2018-11-18 ENCOUNTER — Encounter: Payer: Self-pay | Admitting: Family

## 2018-11-18 VITALS — BP 124/77 | HR 72 | Temp 97.5°F | Ht 70.0 in | Wt 248.6 lb

## 2018-11-18 DIAGNOSIS — E1169 Type 2 diabetes mellitus with other specified complication: Secondary | ICD-10-CM

## 2018-11-18 DIAGNOSIS — E1159 Type 2 diabetes mellitus with other circulatory complications: Secondary | ICD-10-CM | POA: Diagnosis not present

## 2018-11-18 DIAGNOSIS — M059 Rheumatoid arthritis with rheumatoid factor, unspecified: Secondary | ICD-10-CM

## 2018-11-18 DIAGNOSIS — Z0001 Encounter for general adult medical examination with abnormal findings: Secondary | ICD-10-CM | POA: Diagnosis not present

## 2018-11-18 DIAGNOSIS — E785 Hyperlipidemia, unspecified: Secondary | ICD-10-CM

## 2018-11-18 DIAGNOSIS — I152 Hypertension secondary to endocrine disorders: Secondary | ICD-10-CM

## 2018-11-18 DIAGNOSIS — I10 Essential (primary) hypertension: Secondary | ICD-10-CM

## 2018-11-18 DIAGNOSIS — Z0000 Encounter for general adult medical examination without abnormal findings: Secondary | ICD-10-CM | POA: Diagnosis not present

## 2018-11-18 DIAGNOSIS — J310 Chronic rhinitis: Secondary | ICD-10-CM | POA: Diagnosis not present

## 2018-11-18 DIAGNOSIS — E1165 Type 2 diabetes mellitus with hyperglycemia: Secondary | ICD-10-CM

## 2018-11-18 LAB — BAYER DCA HB A1C WAIVED: HB A1C (BAYER DCA - WAIVED): 7.6 % — ABNORMAL HIGH (ref <7.0)

## 2018-11-18 NOTE — Progress Notes (Signed)
Subjective:    Patient ID: [name], male    DOB: 10-Dec-1955, 63 y.o.   MRN: [id number]  Chief Complaint  Patient presents with  . Medical Management of Chronic Issues   PT presents to the office today for CPE. Pt has rheumatoid arthritis and followed by rheumatologists every 3 months.Pt is followed by Cardiologists annually. Diabetes He presents for his follow-up diabetic visit. He has type 2 diabetes mellitus. His disease course has been stable. There are no hypoglycemic associated symptoms. Pertinent negatives for diabetes include no blurred vision, no foot paresthesias and no visual change. Symptoms are stable. Pertinent negatives for diabetic complications include no CVA, heart disease, nephropathy or peripheral neuropathy. Risk factors for coronary artery disease include dyslipidemia, diabetes mellitus, male sex, hypertension and sedentary lifestyle. He is following a generally unhealthy diet. His overall blood glucose range is 110-130 mg/dl. Eye exam is current.  Hypertension This is a chronic problem. The current episode started more than 1 year ago. The problem has been resolved since onset. The problem is controlled. Associated symptoms include malaise/fatigue. Pertinent negatives include no blurred vision, peripheral edema or shortness of breath. Risk factors for coronary artery disease include dyslipidemia, diabetes mellitus, obesity, male gender and sedentary lifestyle. The current treatment provides moderate improvement. There is no history of CVA.  Arthritis Presents for follow-up visit. He complains of pain and stiffness. The symptoms have been stable. Affected locations include the right knee, left knee, right MCP, left MCP and neck. His pain is at a severity of 6/10.  Hyperlipidemia This is a chronic problem. The current episode started more than 1 year ago. The problem is controlled. Recent lipid tests were reviewed and are normal. Exacerbating diseases include obesity.  Pertinent negatives include no shortness of breath. Current antihyperlipidemic treatment includes statins. The current treatment provides moderate improvement of lipids. Risk factors for coronary artery disease include diabetes mellitus, dyslipidemia, male sex, hypertension and a sedentary lifestyle.      Review of Systems  Constitutional: Positive for malaise/fatigue.  Eyes: Negative for blurred vision.  Respiratory: Negative for shortness of breath.   Musculoskeletal: Positive for arthritis and stiffness.  All other systems reviewed and are negative.  Family History  Problem Relation Age of Onset  . Diabetes Father   . Hypertension Father   . Heart Problems Father   . Stroke Father    Social History   Socioeconomic History  . Marital status: Married    Spouse name: Not on file  . Number of children: Not on file  . Years of education: Not on file  . Highest education level: Not on file  Occupational History  . Not on file  Social Needs  . Financial resource strain: Not on file  . Food insecurity    Worry: Not on file    Inability: Not on file  . Transportation needs    Medical: Not on file    Non-medical: Not on file  Tobacco Use  . Smoking status: Former Smoker    Quit date: 03/05/1991    Years since quitting: 27.7  . Smokeless tobacco: Never Used  Substance and Sexual Activity  . Alcohol use: Yes    Comment: rare  . Drug use: No  . Sexual activity: Not on file  Lifestyle  . Physical activity    Days per week: Not on file    Minutes per session: Not on file  . Stress: Not on file  Relationships  . Social connections  Talks on phone: Not on file    Gets together: Not on file    Attends religious service: Not on file    Active member of club or organization: Not on file    Attends meetings of clubs or organizations: Not on file    Relationship status: Not on file  . Intimate partner violence    Fear of current or ex partner: Not on file    Emotionally  abused: Not on file    Physically abused: Not on file    Forced sexual activity: Not on file  Other Topics Concern  . Not on file  Social History Narrative  . Not on file       Objective:   Physical Exam Vitals signs reviewed.  Constitutional:      General: He is not in acute distress.    Appearance: He is well-developed.  HENT:     Head: Normocephalic.     Right Ear: Tympanic membrane normal.     Left Ear: Tympanic membrane normal.  Eyes:     General:        Right eye: No discharge.        Left eye: No discharge.     Pupils: Pupils are equal, round, and reactive to light.  Neck:     Musculoskeletal: Normal range of motion and neck supple.     Thyroid: No thyromegaly.  Cardiovascular:     Rate and Rhythm: Normal rate and regular rhythm.     Heart sounds: Normal heart sounds. No murmur.  Pulmonary:     Effort: Pulmonary effort is normal. No respiratory distress.     Breath sounds: Normal breath sounds. No wheezing.  Abdominal:     General: Bowel sounds are normal. There is no distension.     Palpations: Abdomen is soft.     Tenderness: There is no abdominal tenderness.  Musculoskeletal:        General: Tenderness present.     Comments: Generalized tenderness in bilateral hands and feet.   Skin:    General: Skin is warm and dry.     Findings: No erythema or rash.  Neurological:     Mental Status: He is alert and oriented to person, place, and time.     Cranial Nerves: No cranial nerve deficit.     Deep Tendon Reflexes: Reflexes are normal and symmetric.  Psychiatric:        Behavior: Behavior normal.        Thought Content: Thought content normal.        Judgment: Judgment normal.      BP 124/77   Pulse 72   Temp (!) 97.5 F (36.4 C) (Oral)   Ht _0  (1.778 m)   Wt 248 lb 9.6 oz (112.8 kg)   BMI 35.67 kg/m      Assessment & Plan:  JAHMEER PORCHE comes in today with chief complaint of Medical Management of Chronic Issues   Diagnosis and orders  addressed:  1. Hypertension associated with diabetes ([place]) - CMP14+EGFR - CBC with Differential/Platelet  2. Perennial non-allergic rhinitis - CMP14+EGFR - CBC with Differential/Platelet  3. Hyperlipidemia associated with type 2 diabetes mellitus ([facility]) - CMP14+EGFR - CBC with Differential/Platelet  4. Type 2 diabetes mellitus with hyperglycemia, without long-term current use of insulin ([facility]) - Bayer DCA Hb A1c Waived - CMP14+EGFR - CBC with Differential/Platelet - Microalbumin / creatinine urine ratio  5. Rheumatoid arthritis with positive rheumatoid factor, involving unspecified site ([place]) -  CMP14+EGFR - CBC with Differential/Platelet  6. Morbid obesity ([place]) - CMP14+EGFR - CBC with Differential/Platelet  7. Annual physical exam - Bayer DCA Hb A1c Waived - CMP14+EGFR - CBC with Differential/Platelet - Microalbumin / creatinine urine ratio - PSA, total and free   Labs pending Health Maintenance reviewed Diet and exercise encouraged  Follow up plan: 4 month    Evelina Dun, FNP

## 2018-11-18 NOTE — Patient Instructions (Signed)

## 2018-11-19 ENCOUNTER — Other Ambulatory Visit: Payer: Self-pay | Admitting: Family

## 2018-11-19 DIAGNOSIS — J069 Acute upper respiratory infection, unspecified: Secondary | ICD-10-CM

## 2018-11-19 LAB — CBC WITH DIFFERENTIAL/PLATELET
Basophils Absolute: 0.1 10*3/uL (ref 0.0–0.2)
Basos: 2 %
EOS (ABSOLUTE): 0.9 10*3/uL — ABNORMAL HIGH (ref 0.0–0.4)
Eos: 11 %
Hematocrit: 32.8 % — ABNORMAL LOW (ref 37.5–51.0)
Hemoglobin: 9.8 g/dL — ABNORMAL LOW (ref 13.0–17.7)
Immature Grans (Abs): 0 10*3/uL (ref 0.0–0.1)
Immature Granulocytes: 0 %
Lymphocytes Absolute: 2.7 10*3/uL (ref 0.7–3.1)
Lymphs: 34 %
MCH: 22.1 pg — ABNORMAL LOW (ref 26.6–33.0)
MCHC: 29.9 g/dL — ABNORMAL LOW (ref 31.5–35.7)
MCV: 74 fL — ABNORMAL LOW (ref 79–97)
Monocytes Absolute: 0.5 10*3/uL (ref 0.1–0.9)
Monocytes: 6 %
Neutrophils Absolute: 3.9 10*3/uL (ref 1.4–7.0)
Neutrophils: 47 %
Platelets: 404 10*3/uL (ref 150–450)
RBC: 4.44 x10E6/uL (ref 4.14–5.80)
RDW: 15.2 % (ref 11.6–15.4)
WBC: 8.1 10*3/uL (ref 3.4–10.8)

## 2018-11-19 LAB — MICROALBUMIN / CREATININE URINE RATIO
Creatinine, Urine: 267.2 mg/dL
Microalb/Creat Ratio: 4 mg/g creat (ref 0–29)
Microalbumin, Urine: 11.4 ug/mL

## 2018-11-19 LAB — CMP14+EGFR
ALT: 23 IU/L (ref 0–44)
AST: 24 IU/L (ref 0–40)
Albumin/Globulin Ratio: 1.8 (ref 1.2–2.2)
Albumin: 4.4 g/dL (ref 3.8–4.8)
Alkaline Phosphatase: 62 IU/L (ref 39–117)
BUN/Creatinine Ratio: 9 — ABNORMAL LOW (ref 10–24)
BUN: 13 mg/dL (ref 8–27)
Bilirubin Total: <0.2 mg/dL (ref 0.0–1.2)
CO2: 21 mmol/L (ref 20–29)
Calcium: 9.4 mg/dL (ref 8.6–10.2)
Chloride: 100 mmol/L (ref 96–106)
Creatinine, Ser: 1.47 mg/dL — ABNORMAL HIGH (ref 0.76–1.27)
GFR calc Af Amer: 57 mL/min/{1.73_m2} — ABNORMAL LOW (ref >59)
GFR calc non Af Amer: 49 mL/min/{1.73_m2} — ABNORMAL LOW (ref >59)
Globulin, Total: 2.5 g/dL (ref 1.5–4.5)
Glucose: 126 mg/dL — ABNORMAL HIGH (ref 65–99)
Potassium: 4.6 mmol/L (ref 3.5–5.2)
Sodium: 139 mmol/L (ref 134–144)
Total Protein: 6.9 g/dL (ref 6.0–8.5)

## 2018-11-19 LAB — PSA, TOTAL AND FREE
PSA, Free Pct: 31.4 %
PSA, Free: 0.22 ng/mL
Prostate Specific Ag, Serum: 0.7 ng/mL (ref 0.0–4.0)

## 2018-11-20 ENCOUNTER — Other Ambulatory Visit: Payer: Self-pay | Admitting: Family

## 2018-11-20 DIAGNOSIS — R7989 Other specified abnormal findings of blood chemistry: Secondary | ICD-10-CM

## 2018-11-21 ENCOUNTER — Other Ambulatory Visit: Payer: Self-pay | Admitting: Family

## 2018-11-21 DIAGNOSIS — D649 Anemia, unspecified: Secondary | ICD-10-CM

## 2018-11-25 DIAGNOSIS — M0609 Rheumatoid arthritis without rheumatoid factor, multiple sites: Secondary | ICD-10-CM | POA: Diagnosis not present

## 2018-12-05 ENCOUNTER — Other Ambulatory Visit: Payer: BC Managed Care – PPO

## 2018-12-05 ENCOUNTER — Other Ambulatory Visit: Payer: Self-pay

## 2018-12-05 DIAGNOSIS — D649 Anemia, unspecified: Secondary | ICD-10-CM

## 2018-12-05 DIAGNOSIS — R6889 Other general symptoms and signs: Secondary | ICD-10-CM | POA: Diagnosis not present

## 2018-12-05 DIAGNOSIS — R7989 Other specified abnormal findings of blood chemistry: Secondary | ICD-10-CM | POA: Diagnosis not present

## 2018-12-06 LAB — BMP8+EGFR
BUN/Creatinine Ratio: 11 (ref 10–24)
BUN: 15 mg/dL (ref 8–27)
CO2: 22 mmol/L (ref 20–29)
Calcium: 9 mg/dL (ref 8.6–10.2)
Chloride: 101 mmol/L (ref 96–106)
Creatinine, Ser: 1.32 mg/dL — ABNORMAL HIGH (ref 0.76–1.27)
GFR calc Af Amer: 65 mL/min/{1.73_m2} (ref >59)
GFR calc non Af Amer: 56 mL/min/{1.73_m2} — ABNORMAL LOW (ref >59)
Glucose: 140 mg/dL — ABNORMAL HIGH (ref 65–99)
Potassium: 4.2 mmol/L (ref 3.5–5.2)
Sodium: 139 mmol/L (ref 134–144)

## 2018-12-06 LAB — ANEMIA PROFILE B
Basophils Absolute: 0.1 10*3/uL (ref 0.0–0.2)
Basos: 1 %
EOS (ABSOLUTE): 0.8 10*3/uL — ABNORMAL HIGH (ref 0.0–0.4)
Eos: 10 %
Ferritin: 7 ng/mL — ABNORMAL LOW (ref 30–400)
Folate: 7.1 ng/mL (ref >3.0)
Hematocrit: 33.5 % — ABNORMAL LOW (ref 37.5–51.0)
Hemoglobin: 9.5 g/dL — ABNORMAL LOW (ref 13.0–17.7)
Immature Grans (Abs): 0 10*3/uL (ref 0.0–0.1)
Immature Granulocytes: 0 %
Iron Saturation: 6 % — CL (ref 15–55)
Iron: 26 ug/dL — ABNORMAL LOW (ref 38–169)
Lymphocytes Absolute: 2.4 10*3/uL (ref 0.7–3.1)
Lymphs: 31 %
MCH: 21 pg — ABNORMAL LOW (ref 26.6–33.0)
MCHC: 28.4 g/dL — ABNORMAL LOW (ref 31.5–35.7)
MCV: 74 fL — ABNORMAL LOW (ref 79–97)
Monocytes Absolute: 0.5 10*3/uL (ref 0.1–0.9)
Monocytes: 7 %
Neutrophils Absolute: 3.9 10*3/uL (ref 1.4–7.0)
Neutrophils: 51 %
Platelets: 324 10*3/uL (ref 150–450)
RBC: 4.52 x10E6/uL (ref 4.14–5.80)
RDW: 15.3 % (ref 11.6–15.4)
Retic Ct Pct: 1.1 % (ref 0.6–2.6)
Total Iron Binding Capacity: 451 ug/dL — ABNORMAL HIGH (ref 250–450)
UIBC: 425 ug/dL — ABNORMAL HIGH (ref 111–343)
Vitamin B-12: 393 pg/mL (ref 232–1245)
WBC: 7.7 10*3/uL (ref 3.4–10.8)

## 2018-12-09 ENCOUNTER — Other Ambulatory Visit: Payer: Self-pay | Admitting: Family

## 2018-12-09 DIAGNOSIS — D509 Iron deficiency anemia, unspecified: Secondary | ICD-10-CM

## 2018-12-11 DIAGNOSIS — Z79899 Other long term (current) drug therapy: Secondary | ICD-10-CM | POA: Diagnosis not present

## 2018-12-11 DIAGNOSIS — M545 Low back pain: Secondary | ICD-10-CM | POA: Diagnosis not present

## 2018-12-11 DIAGNOSIS — G894 Chronic pain syndrome: Secondary | ICD-10-CM | POA: Diagnosis not present

## 2018-12-11 DIAGNOSIS — E559 Vitamin D deficiency, unspecified: Secondary | ICD-10-CM | POA: Diagnosis not present

## 2018-12-11 DIAGNOSIS — G8929 Other chronic pain: Secondary | ICD-10-CM | POA: Diagnosis not present

## 2018-12-11 DIAGNOSIS — M129 Arthropathy, unspecified: Secondary | ICD-10-CM | POA: Diagnosis not present

## 2018-12-18 ENCOUNTER — Other Ambulatory Visit: Payer: Self-pay | Admitting: Family

## 2018-12-18 DIAGNOSIS — J069 Acute upper respiratory infection, unspecified: Secondary | ICD-10-CM

## 2018-12-23 DIAGNOSIS — Z79899 Other long term (current) drug therapy: Secondary | ICD-10-CM | POA: Diagnosis not present

## 2018-12-23 DIAGNOSIS — M0609 Rheumatoid arthritis without rheumatoid factor, multiple sites: Secondary | ICD-10-CM | POA: Diagnosis not present

## 2018-12-24 ENCOUNTER — Other Ambulatory Visit: Payer: Self-pay | Admitting: Family

## 2018-12-26 ENCOUNTER — Encounter (HOSPITAL_COMMUNITY): Payer: Self-pay

## 2018-12-29 ENCOUNTER — Other Ambulatory Visit: Payer: Self-pay

## 2018-12-29 ENCOUNTER — Encounter (HOSPITAL_COMMUNITY): Payer: Self-pay | Admitting: Surgery

## 2018-12-29 ENCOUNTER — Inpatient Hospital Stay (HOSPITAL_COMMUNITY): Payer: BC Managed Care – PPO | Attending: Hematology | Admitting: Hematology

## 2018-12-29 ENCOUNTER — Encounter (HOSPITAL_COMMUNITY): Payer: Self-pay | Admitting: Hematology

## 2018-12-29 ENCOUNTER — Inpatient Hospital Stay (HOSPITAL_COMMUNITY): Payer: BC Managed Care – PPO

## 2018-12-29 DIAGNOSIS — M069 Rheumatoid arthritis, unspecified: Secondary | ICD-10-CM

## 2018-12-29 DIAGNOSIS — D509 Iron deficiency anemia, unspecified: Secondary | ICD-10-CM | POA: Insufficient documentation

## 2018-12-29 DIAGNOSIS — Z87891 Personal history of nicotine dependence: Secondary | ICD-10-CM | POA: Diagnosis not present

## 2018-12-29 LAB — LACTATE DEHYDROGENASE: LDH: 151 U/L (ref 98–192)

## 2018-12-29 LAB — RETICULOCYTES
Immature Retic Fract: 18.4 % — ABNORMAL HIGH (ref 2.3–15.9)
RBC.: 4.67 MIL/uL (ref 4.22–5.81)
Retic Count, Absolute: 81.7 10*3/uL (ref 19.0–186.0)
Retic Ct Pct: 1.8 % (ref 0.4–3.1)

## 2018-12-29 NOTE — Patient Instructions (Signed)
[place] at [facility] Discharge Instructions  You were seen today by Dr. Delton Coombes. He went over your history, family history and how you've been feeling lately. He will get lab work drawn today. He will see you back in 3 weeks for follow up.   Thank you for choosing [place] at [facility] to provide your oncology and hematology care.  To afford each patient quality time with our provider, please arrive at least 15 minutes before your scheduled appointment time.   If you have a lab appointment with the [facility] please come in thru the  Main Entrance and check in at the main information desk  You need to re-schedule your appointment should you arrive 10 or more minutes late.  We strive to give you quality time with our providers, and arriving late affects you and other patients whose appointments are after yours.  Also, if you no show three or more times for appointments you may be dismissed from the clinic at the providers discretion.     Again, thank you for choosing [facility].  Our hope is that these requests will decrease the amount of time that you wait before being seen by our physicians.       _____________________________________________________________  Should you have questions after your visit to [facility], please contact our office at [phone] between the hours of 8:00 a.m. and 4:30 p.m.  Voicemails left after 4:00 p.m. will not be returned until the following business day.  For prescription refill requests, have your pharmacy contact our office and allow 72 hours.    Cancer Center Support Programs:   > Cancer Support Group  2nd Tuesday of the month 1pm-2pm, Journey Room

## 2018-12-29 NOTE — Assessment & Plan Note (Addendum)
1.  Microcytic anemia: - Labs on 12/05/2018 shows hemoglobin of 9.5 with MCV of 74. - Ferritin was 7 with percent saturation of 6.  He also has CKD with creatinine around 1.3. - He has been taking iron tablet daily for the last 3 weeks and complains of constipation.  He also takes omeprazole 3 times a week. -She usually gets tired in the afternoons.  Denies any bleeding per rectum or melena. -Patient never had a colonoscopy.  Denies any pica.  No prior history of blood transfusions. -Denies any fevers, night sweats or weight loss. - We will check his SPEP, methylmalonic acid, copper levels today.  We will also check LDH and reticulocyte count.  We will check his stool for occult blood. -I have strongly counseled him to have a colonoscopy done.  He will talk to Evelina Dun, FNP. - I have recommended Feraheme weekly x2.  We talked about side effects in detail including rare chance of anaphylactic reactions.  We will schedule him for 2 infusions and see him back in 6 weeks for follow-up.  We plan to repeat his ferritin, iron panel, CBC at that time.  2.  Rheumatoid arthritis: -He is on Orencia every 4 weeks.  3.  Tobacco abuse: -He quit smoking in 1992.  He smoked 1 and half pack per day for 20 years.

## 2018-12-29 NOTE — Progress Notes (Signed)
CONSULT NOTE  Patient Care Team: Sharion Balloon, FNP as PCP - General (Nurse Practitioner) Josue Hector, MD as PCP - Cardiology (Cardiology)  CHIEF COMPLAINTS/PURPOSE OF CONSULTATION:  Microcytic anemia.  HISTORY OF PRESENTING ILLNESS:  [name] 63 y.o. male is seen in consultation today for further work-up and management of microcytic anemia.  Labs from 12/05/2018 shows hemoglobin of 9.5 with MCV of 74.  Ferritin was found to be low at 7% saturation of 6.  He was started on iron tablet about 3 weeks ago and complains of constipation.  Denies any bleeding per rectum or melena.  Never had a colonoscopy.  Denies any chest pain on exertion.  Dyspnea on exertion is present.  Appetite is 100%.  Energy levels are 75%.  Complains of pain all over the joints from his rheumatoid arthritis.  Denies any presyncopal episodes or palpitations.  Denies any bleeding episodes including epistaxis, hematuria.  Does not report taking over-the-counter NSAIDs.  He is not on any blood thinners.  No prior history of malignancy.  Denies any pica and eats severity of diet.  Denies any prior history of blood transfusion.  He has built machinery prior to retirement.  He smoked about 1 and half pack per day for 20 years.  He quit smoking in 1992.  No family history of anemias and transfusion requirements.  His sister has cancer but patient not aware of the type.  Denies any fevers, night sweats or weight loss in the last 6 months.  He reports that he usually gets tired in the afternoons.  No recurrent infections or hospitalizations.    MEDICAL HISTORY:  Past Medical History:  Diagnosis Date  . Anemia   . Arthritis    rheumatoid arthritis  . Diabetes mellitus   . Hyperlipidemia   . Hypertension   . Primary localized osteoarthritis of right knee 05/13/2015  . Scapho-lunate dissociation, right   . Vitamin D deficiency     SURGICAL HISTORY: Past Surgical History:  Procedure Laterality Date  . CARDIAC  CATHETERIZATION  2005   normal  . CARPAL TUNNEL RELEASE  2009   both hands  . HARDWARE REMOVAL Right 11/14/2017   Procedure: REMOVAL ACCUTRAC SCREW RIGHT WRIST;  Surgeon: Daryll Brod, MD;  Location: [facility];  Service: Orthopedics;  Laterality: Right;  . KNEE ARTHROSCOPY     rt  . PARTIAL KNEE ARTHROPLASTY Right 05/13/2015   Procedure: RIGHT UNI KNEE ARTHROPLASTY;  Surgeon: Marchia Bond, MD;  Location: [facility];  Service: Orthopedics;  Laterality: Right;  . scaphoid excision Left 2012   Dr Daylene Katayama  . TONSILLECTOMY    . WRIST FUSION Right 03/14/2017   Procedure: SCAPHOID EXCISION ULNAR FOUR BONE FUSION RIGHT;  Surgeon: Daryll Brod, MD;  Location: [facility];  Service: Orthopedics;  Laterality: Right;    SOCIAL HISTORY: Social History   Socioeconomic History  . Marital status: Married    Spouse name: Not on file  . Number of children: 2  . Years of education: Not on file  . Highest education level: Not on file  Occupational History  . Occupation: Disabled  Social Needs  . Financial resource strain: Not on file  . Food insecurity    Worry: Not on file    Inability: Not on file  . Transportation needs    Medical: Not on file    Non-medical: Not on file  Tobacco Use  . Smoking status: Former Audiological scientist  date: 03/05/1991    Years since quitting: 27.8  . Smokeless tobacco: Never Used  Substance and Sexual Activity  . Alcohol use: Yes    Comment: rare  . Drug use: No  . Sexual activity: Not on file  Lifestyle  . Physical activity    Days per week: Not on file    Minutes per session: Not on file  . Stress: Not on file  Relationships  . Social Herbalist on phone: Not on file    Gets together: Not on file    Attends religious service: Not on file    Active member of club or organization: Not on file    Attends meetings of clubs or organizations: Not on file    Relationship status: Not on file  . Intimate partner  violence    Fear of current or ex partner: Not on file    Emotionally abused: Not on file    Physically abused: Not on file    Forced sexual activity: Not on file  Other Topics Concern  . Not on file  Social History Narrative  . Not on file    FAMILY HISTORY: Family History  Problem Relation Age of Onset  . Diabetes Father   . Hypertension Father   . Heart Problems Father   . Stroke Father   . Diabetes Maternal Grandmother   . Arthritis Sister   . Arthritis Sister   . Arthritis Daughter     ALLERGIES:  is allergic to other; shellfish allergy; leflunomide; methotrexate; doxycycline; invokana [canagliflozin]; and keflex [cephalexin].  MEDICATIONS:  Current Outpatient Medications  Medication Sig Dispense Refill  . abatacept in sodium chloride 0.9 % Inject into the vein every 30 (thirty) days. Reported on 06/27/2015    . aspirin EC 81 MG tablet Take 81 mg by mouth daily.    [name] glimepiride (AMARYL) 2 MG tablet     . hydrochlorothiazide (MICROZIDE) 12.5 MG capsule TAKE (1) CAPSULE DAILY 90 capsule 1  . losartan (COZAAR) 100 MG tablet TAKE 1 TABLET BY MOUTH  DAILY 90 tablet 3  . meloxicam (MOBIC) 15 MG tablet Take 15 mg by mouth daily.    . metFORMIN (GLUCOPHAGE) 1000 MG tablet TAKE 1 TABLET IN THE MORNING AND 1&1/2 TABLETS IN THE EVENING 75 tablet 0  . oxyCODONE-acetaminophen (PERCOCET) 10-325 MG tablet Take 1-2 tablets by mouth every 6 (six) hours as needed for pain. MAXIMUM TOTAL ACETAMINOPHEN DOSE IS 4000 MG PER DAY 50 tablet 0  . simvastatin (ZOCOR) 10 MG tablet TAKE 1 TABLET BY MOUTH  DAILY. 90 tablet 3   No current facility-administered medications for this visit.     REVIEW OF SYSTEMS:   Constitutional: Denies fevers, chills or abnormal night sweats Eyes: Denies blurriness of vision, double vision or watery eyes Ears, nose, mouth, throat, and face: Denies mucositis or sore throat Respiratory: Denies cough, dyspnea or wheezes Cardiovascular: Denies palpitation, chest  discomfort or lower extremity swelling Gastrointestinal:  Denies nausea, heartburn or change in bowel habits Skin: Denies abnormal skin rashes Lymphatics: Denies new lymphadenopathy or easy bruising Neurological:Denies numbness, tingling or new weaknesses Behavioral/Psych: Mood is stable, no new changes  All other systems were reviewed with the patient and are negative.  PHYSICAL EXAMINATION: ECOG PERFORMANCE STATUS: 1 - Symptomatic but completely ambulatory  Vitals:   12/29/18 1310  BP: 140/70  Pulse: 99  Resp: 18  Temp: 97.9 F (36.6 C)  SpO2: 95%   Filed Weights   12/29/18 1310  Weight: 254 lb 12.8 oz (115.6 kg)    GENERAL:alert, no distress and comfortable SKIN: skin color, texture, turgor are normal, no rashes or significant lesions EYES: normal, conjunctiva are pink and non-injected, sclera clear OROPHARYNX:no exudate, no erythema and lips, buccal mucosa, and tongue normal  NECK: supple, thyroid normal size, non-tender, without nodularity LYMPH:  no palpable lymphadenopathy in the cervical, axillary or inguinal LUNGS: clear to auscultation and percussion with normal breathing effort HEART: regular rate & rhythm and no murmurs and no lower extremity edema ABDOMEN:abdomen soft, non-tender and normal bowel sounds Musculoskeletal:no cyanosis of digits and no clubbing  PSYCH: alert & oriented x 3 with fluent speech NEURO: no focal motor/sensory deficits  LABORATORY DATA:  I have reviewed the data as listed Recent Results (from the past 2160 hour(s))  HM DIABETES EYE EXAM     Status: Abnormal   Collection Time: 10/07/18 12:00 AM  Result Value Ref Range   HM Diabetic Eye Exam Retinopathy (A) No Retinopathy    Comment: Rosemarie Beath, OD  Bayer DCA Hb A1c Waived     Status: Abnormal   Collection Time: 11/18/18  8:46 AM  Result Value Ref Range   HB A1C (BAYER DCA - WAIVED) 7.6 (H) <7.0 %    Comment:                                       Diabetic Adult            <7.0                                        Healthy Adult        4.3 - 5.7                                                           (DCCT/NGSP) American Diabetes Association's Summary of Glycemic Recommendations for Adults with Diabetes: Hemoglobin A1c <7.0%. More stringent glycemic goals (A1c <6.0%) may further reduce complications at the cost of increased risk of hypoglycemia.   CMP14+EGFR     Status: Abnormal   Collection Time: 11/18/18  8:46 AM  Result Value Ref Range   Glucose 126 (H) 65 - 99 mg/dL   BUN 13 8 - 27 mg/dL   Creatinine, Ser 1.47 (H) 0.76 - 1.27 mg/dL   GFR calc non Af Amer 49 (L) >59 mL/min/1.73   GFR calc Af Amer 57 (L) >59 mL/min/1.73   BUN/Creatinine Ratio 9 (L) 10 - 24   Sodium 139 134 - 144 mmol/L   Potassium 4.6 3.5 - 5.2 mmol/L   Chloride 100 96 - 106 mmol/L   CO2 21 20 - 29 mmol/L   Calcium 9.4 8.6 - 10.2 mg/dL   Total Protein 6.9 6.0 - 8.5 g/dL   Albumin 4.4 3.8 - 4.8 g/dL   Globulin, Total 2.5 1.5 - 4.5 g/dL   Albumin/Globulin Ratio 1.8 1.2 - 2.2   Bilirubin Total <0.2 0.0 - 1.2 mg/dL   Alkaline Phosphatase 62 39 - 117 IU/L   AST 24 0 - 40 IU/L   ALT 23 0 - 44 IU/L  CBC with Differential/Platelet     Status: Abnormal   Collection Time: 11/18/18  8:46 AM  Result Value Ref Range   WBC 8.1 3.4 - 10.8 x10E3/uL   RBC 4.44 4.14 - 5.80 x10E6/uL   Hemoglobin 9.8 (L) 13.0 - 17.7 g/dL   Hematocrit 32.8 (L) 37.5 - 51.0 %   MCV 74 (L) 79 - 97 fL   MCH 22.1 (L) 26.6 - 33.0 pg   MCHC 29.9 (L) 31.5 - 35.7 g/dL   RDW 15.2 11.6 - 15.4 %   Platelets 404 150 - 450 x10E3/uL   Neutrophils 47 Not Estab. %   Lymphs 34 Not Estab. %   Monocytes 6 Not Estab. %   Eos 11 Not Estab. %   Basos 2 Not Estab. %   Neutrophils Absolute 3.9 1.4 - 7.0 x10E3/uL   Lymphocytes Absolute 2.7 0.7 - 3.1 x10E3/uL   Monocytes Absolute 0.5 0.1 - 0.9 x10E3/uL   EOS (ABSOLUTE) 0.9 (H) 0.0 - 0.4 x10E3/uL   Basophils Absolute 0.1 0.0 - 0.2 x10E3/uL   Immature Granulocytes 0 Not Estab. %    Immature Grans (Abs) 0.0 0.0 - 0.1 x10E3/uL  PSA, total and free     Status: None   Collection Time: 11/18/18  8:46 AM  Result Value Ref Range   Prostate Specific Ag, Serum 0.7 0.0 - 4.0 ng/mL    Comment: Roche ECLIA methodology. According to the American Urological Association, Serum PSA should decrease and remain at undetectable levels after radical prostatectomy. The AUA defines biochemical recurrence as an initial PSA value 0.2 ng/mL or greater followed by a subsequent confirmatory PSA value 0.2 ng/mL or greater. Values obtained with different assay methods or kits cannot be used interchangeably. Results cannot be interpreted as absolute evidence of the presence or absence of malignant disease.    PSA, Free 0.22 N/A ng/mL    Comment: Roche ECLIA methodology.   PSA, Free Pct 31.4 %    Comment: The table below lists the probability of prostate cancer for men with non-suspicious DRE results and total PSA between 4 and 10 ng/mL, by patient age Ricci Barker, [place], 810:1751).                   % Free PSA       50-64 yr        65-75 yr                   0.00-10.00%        56%             55%                  10.01-15.00%        24%             35%                  15.01-20.00%        17%             23%                  20.01-25.00%        10%             20%                       >25.00%         5%  9% Please note:  Catalona et al did not make specific               recommendations regarding the use of               percent free PSA for any other population               of men.   Microalbumin / creatinine urine ratio     Status: None   Collection Time: 11/18/18  8:58 AM  Result Value Ref Range   Creatinine, Urine 267.2 Not Estab. mg/dL   Microalbumin, Urine 11.4 Not Estab. ug/mL   Microalb/Creat Ratio 4 0 - 29 mg/g creat    Comment:                        Normal:                0 -  29                        Moderately increased: 30 - 300                         Severely increased:       >300               **Please note reference interval change**   Anemia Profile B     Status: Abnormal   Collection Time: 12/05/18  8:30 AM  Result Value Ref Range   Total Iron Binding Capacity 451 (H) 250 - 450 ug/dL   UIBC 425 (H) 111 - 343 ug/dL   Iron 26 (L) 38 - 169 ug/dL   Iron Saturation 6 (LL) 15 - 55 %   Ferritin 7 (L) 30 - 400 ng/mL   Vitamin B-12 393 232 - 1,245 pg/mL   Folate 7.1 >3.0 ng/mL    Comment: A serum folate concentration of less than 3.1 ng/mL is considered to represent clinical deficiency.    WBC 7.7 3.4 - 10.8 x10E3/uL   RBC 4.52 4.14 - 5.80 x10E6/uL   Hemoglobin 9.5 (L) 13.0 - 17.7 g/dL   Hematocrit 33.5 (L) 37.5 - 51.0 %   MCV 74 (L) 79 - 97 fL   MCH 21.0 (L) 26.6 - 33.0 pg   MCHC 28.4 (L) 31.5 - 35.7 g/dL   RDW 15.3 11.6 - 15.4 %   Platelets 324 150 - 450 x10E3/uL   Neutrophils 51 Not Estab. %   Lymphs 31 Not Estab. %   Monocytes 7 Not Estab. %   Eos 10 Not Estab. %   Basos 1 Not Estab. %   Neutrophils Absolute 3.9 1.4 - 7.0 x10E3/uL   Lymphocytes Absolute 2.4 0.7 - 3.1 x10E3/uL   Monocytes Absolute 0.5 0.1 - 0.9 x10E3/uL   EOS (ABSOLUTE) 0.8 (H) 0.0 - 0.4 x10E3/uL   Basophils Absolute 0.1 0.0 - 0.2 x10E3/uL   Immature Granulocytes 0 Not Estab. %   Immature Grans (Abs) 0.0 0.0 - 0.1 x10E3/uL   Retic Ct Pct 1.1 0.6 - 2.6 %  BMP8+EGFR     Status: Abnormal   Collection Time: 12/05/18  8:30 AM  Result Value Ref Range   Glucose 140 (H) 65 - 99 mg/dL   BUN 15 8 - 27 mg/dL   Creatinine, Ser 1.32 (H) 0.76 - 1.27 mg/dL   GFR calc non Af Amer 56 (L) >59 mL/min/1.73  GFR calc Af Amer 65 >59 mL/min/1.73   BUN/Creatinine Ratio 11 10 - 24   Sodium 139 134 - 144 mmol/L   Potassium 4.2 3.5 - 5.2 mmol/L   Chloride 101 96 - 106 mmol/L   CO2 22 20 - 29 mmol/L   Calcium 9.0 8.6 - 10.2 mg/dL  Reticulocytes     Status: Abnormal   Collection Time: 12/29/18  2:09 PM  Result Value Ref Range   Retic Ct Pct 1.8 0.4 - 3.1 %   RBC.  4.67 4.22 - 5.81 MIL/uL   Retic Count, Absolute 81.7 19.0 - 186.0 K/uL   Immature Retic Fract 18.4 (H) 2.3 - 15.9 %    Comment: Performed at [facility], [address] [place], [place] [address]  Lactate dehydrogenase     Status: None   Collection Time: 12/29/18  2:09 PM  Result Value Ref Range   LDH 151 98 - 192 U/L    Comment: Performed at [facility], [address], [place], [place] [address]    RADIOGRAPHIC STUDIES: I have personally reviewed the radiological images as listed and agreed with the findings in the report.  ASSESSMENT & PLAN:  Microcytic anemia 1.  Microcytic anemia: - Labs on 12/05/2018 shows hemoglobin of 9.5 with MCV of 74. - Ferritin was 7 with percent saturation of 6.  He also has CKD with creatinine around 1.3. - He has been taking iron tablet daily for the last 3 weeks and complains of constipation.  He also takes omeprazole 3 times a week. -She usually gets tired in the afternoons.  Denies any bleeding per rectum or melena. -Patient never had a colonoscopy.  Denies any pica.  No prior history of blood transfusions. -Denies any fevers, night sweats or weight loss. - We will check his SPEP, methylmalonic acid, copper levels today.  We will also check LDH and reticulocyte count.  We will check his stool for occult blood. -I have strongly counseled him to have a colonoscopy done.  He will talk to Evelina Dun, FNP. - I have recommended Feraheme weekly x2.  We talked about side effects in detail including rare chance of anaphylactic reactions.  We will schedule him for 2 infusions and see him back in 6 weeks for follow-up.  We plan to repeat his ferritin, iron panel, CBC at that time.  2.  Rheumatoid arthritis: -He is on Orencia every 4 weeks.  3.  Tobacco abuse: -He quit smoking in 1992.  He smoked 1 and half pack per day for 20 years.     All questions were answered. The patient knows to call the clinic with any problems, questions or concerns.       Derek Jack, MD 12/29/18 6:17 PM

## 2018-12-31 LAB — PROTEIN ELECTROPHORESIS, SERUM
A/G Ratio: 1.3 (ref 0.7–1.7)
Albumin ELP: 3.7 g/dL (ref 2.9–4.4)
Alpha-1-Globulin: 0.2 g/dL (ref 0.0–0.4)
Alpha-2-Globulin: 0.9 g/dL (ref 0.4–1.0)
Beta Globulin: 1 g/dL (ref 0.7–1.3)
Gamma Globulin: 0.8 g/dL (ref 0.4–1.8)
Globulin, Total: 2.9 g/dL (ref 2.2–3.9)
Total Protein ELP: 6.6 g/dL (ref 6.0–8.5)

## 2019-01-01 LAB — COPPER, SERUM: Copper: 95 ug/dL (ref 72–166)

## 2019-01-02 ENCOUNTER — Inpatient Hospital Stay (HOSPITAL_COMMUNITY): Payer: BC Managed Care – PPO

## 2019-01-02 ENCOUNTER — Other Ambulatory Visit: Payer: Self-pay

## 2019-01-02 ENCOUNTER — Encounter (HOSPITAL_COMMUNITY): Payer: Self-pay

## 2019-01-02 VITALS — BP 148/69 | HR 65 | Temp 97.3°F | Resp 18

## 2019-01-02 DIAGNOSIS — D509 Iron deficiency anemia, unspecified: Secondary | ICD-10-CM | POA: Diagnosis not present

## 2019-01-02 DIAGNOSIS — Z87891 Personal history of nicotine dependence: Secondary | ICD-10-CM | POA: Diagnosis not present

## 2019-01-02 DIAGNOSIS — M069 Rheumatoid arthritis, unspecified: Secondary | ICD-10-CM | POA: Diagnosis not present

## 2019-01-02 LAB — METHYLMALONIC ACID, SERUM: Methylmalonic Acid, Quantitative: 152 nmol/L (ref 0–378)

## 2019-01-02 LAB — OCCULT BLOOD X 1 CARD TO LAB, STOOL
Fecal Occult Bld: NEGATIVE
Fecal Occult Bld: NEGATIVE

## 2019-01-02 MED ORDER — SODIUM CHLORIDE 0.9 % IV SOLN
510.0000 mg | Freq: Once | INTRAVENOUS | Status: AC
  Administered 2019-01-02: 510 mg via INTRAVENOUS
  Filled 2019-01-02: qty 510

## 2019-01-02 MED ORDER — METHYLPREDNISOLONE SODIUM SUCC 125 MG IJ SOLR
125.0000 mg | Freq: Once | INTRAMUSCULAR | Status: AC
  Administered 2019-01-02: 125 mg via INTRAVENOUS
  Filled 2019-01-02: qty 2

## 2019-01-02 MED ORDER — SODIUM CHLORIDE 0.9 % IV SOLN
Freq: Once | INTRAVENOUS | Status: AC
  Administered 2019-01-02: 09:00:00 via INTRAVENOUS

## 2019-01-02 NOTE — Patient Instructions (Signed)
[facility] at [facility] Discharge Instructions  Received Feraheme infusion today. Follow-up as scheduled. Call clinic for any questions or concerns   Thank you for choosing [facility] at [facility] to provide your oncology and hematology care.  To afford each patient quality time with our provider, please arrive at least 15 minutes before your scheduled appointment time.   If you have a lab appointment with the Cancer Center please come in thru the Main Entrance and check in at the main information desk.  You need to re-schedule your appointment should you arrive 10 or more minutes late.  We strive to give you quality time with our providers, and arriving late affects you and other patients whose appointments are after yours.  Also, if you no show three or more times for appointments you may be dismissed from the clinic at the providers discretion.     Again, thank you for choosing [facility].  Our hope is that these requests will decrease the amount of time that you wait before being seen by our physicians.       _____________________________________________________________  Should you have questions after your visit to [facility], please contact our office at [phone] between the hours of 8:00 a.m. and 4:30 p.m.  Voicemails left after 4:00 p.m. will not be returned until the following business day.  For prescription refill requests, have your pharmacy contact our office and allow 72 hours.    Due to Covid, you will need to wear a mask upon entering the hospital. If you do not have a mask, a mask will be given to you at the Main Entrance upon arrival. For doctor visits, patients may have 1 support person with them. For treatment visits, patients can not have anyone with them due to social distancing guidelines and our immunocompromised population.     

## 2019-01-02 NOTE — Progress Notes (Signed)
[name] tolerated Feraheme infusion well without complaints or incident. VSS upon discharge. Peripheral IV site checked with positive blood return noted prior to and after infusion. Pt discharged self ambulatory in satisfactory condition

## 2019-01-05 DIAGNOSIS — M0609 Rheumatoid arthritis without rheumatoid factor, multiple sites: Secondary | ICD-10-CM | POA: Diagnosis not present

## 2019-01-05 DIAGNOSIS — M150 Primary generalized (osteo)arthritis: Secondary | ICD-10-CM | POA: Diagnosis not present

## 2019-01-05 DIAGNOSIS — M2550 Pain in unspecified joint: Secondary | ICD-10-CM | POA: Diagnosis not present

## 2019-01-05 DIAGNOSIS — Z79899 Other long term (current) drug therapy: Secondary | ICD-10-CM | POA: Diagnosis not present

## 2019-01-08 DIAGNOSIS — E669 Obesity, unspecified: Secondary | ICD-10-CM | POA: Diagnosis not present

## 2019-01-08 DIAGNOSIS — Z760 Encounter for issue of repeat prescription: Secondary | ICD-10-CM | POA: Diagnosis not present

## 2019-01-08 DIAGNOSIS — G894 Chronic pain syndrome: Secondary | ICD-10-CM | POA: Diagnosis not present

## 2019-01-08 DIAGNOSIS — M1990 Unspecified osteoarthritis, unspecified site: Secondary | ICD-10-CM | POA: Diagnosis not present

## 2019-01-08 DIAGNOSIS — Z79899 Other long term (current) drug therapy: Secondary | ICD-10-CM | POA: Diagnosis not present

## 2019-01-09 ENCOUNTER — Other Ambulatory Visit: Payer: Self-pay

## 2019-01-09 ENCOUNTER — Encounter (HOSPITAL_COMMUNITY): Payer: Self-pay

## 2019-01-09 ENCOUNTER — Inpatient Hospital Stay (HOSPITAL_COMMUNITY): Payer: BC Managed Care – PPO | Attending: Hematology

## 2019-01-09 VITALS — BP 136/70 | HR 65 | Temp 97.1°F | Resp 18

## 2019-01-09 DIAGNOSIS — Z7982 Long term (current) use of aspirin: Secondary | ICD-10-CM | POA: Diagnosis not present

## 2019-01-09 DIAGNOSIS — M069 Rheumatoid arthritis, unspecified: Secondary | ICD-10-CM | POA: Diagnosis not present

## 2019-01-09 DIAGNOSIS — I129 Hypertensive chronic kidney disease with stage 1 through stage 4 chronic kidney disease, or unspecified chronic kidney disease: Secondary | ICD-10-CM | POA: Insufficient documentation

## 2019-01-09 DIAGNOSIS — D509 Iron deficiency anemia, unspecified: Secondary | ICD-10-CM | POA: Insufficient documentation

## 2019-01-09 DIAGNOSIS — E1122 Type 2 diabetes mellitus with diabetic chronic kidney disease: Secondary | ICD-10-CM | POA: Insufficient documentation

## 2019-01-09 DIAGNOSIS — Z87891 Personal history of nicotine dependence: Secondary | ICD-10-CM | POA: Insufficient documentation

## 2019-01-09 DIAGNOSIS — N189 Chronic kidney disease, unspecified: Secondary | ICD-10-CM | POA: Diagnosis not present

## 2019-01-09 MED ORDER — SODIUM CHLORIDE 0.9 % IV SOLN
Freq: Once | INTRAVENOUS | Status: AC
  Administered 2019-01-09: 09:00:00 via INTRAVENOUS

## 2019-01-09 MED ORDER — SODIUM CHLORIDE 0.9% FLUSH
10.0000 mL | Freq: Once | INTRAVENOUS | Status: AC | PRN
  Administered 2019-01-09: 10 mL

## 2019-01-09 MED ORDER — SODIUM CHLORIDE 0.9 % IV SOLN
510.0000 mg | Freq: Once | INTRAVENOUS | Status: AC
  Administered 2019-01-09: 510 mg via INTRAVENOUS
  Filled 2019-01-09: qty 510

## 2019-01-09 MED ORDER — METHYLPREDNISOLONE SODIUM SUCC 125 MG IJ SOLR
125.0000 mg | Freq: Once | INTRAMUSCULAR | Status: AC
  Administered 2019-01-09: 125 mg via INTRAVENOUS
  Filled 2019-01-09: qty 2

## 2019-01-09 NOTE — Progress Notes (Signed)
Patient tolerated iron infusion with no complaints voiced.  Peripheral IV site clean and dry with good blood return noted before and after infusion.  Band aid applied.  VSS with discharge and left ambulatory with no s/s of distress noted.  

## 2019-01-19 ENCOUNTER — Other Ambulatory Visit: Payer: Self-pay | Admitting: Family

## 2019-01-19 DIAGNOSIS — J069 Acute upper respiratory infection, unspecified: Secondary | ICD-10-CM

## 2019-01-20 ENCOUNTER — Ambulatory Visit (HOSPITAL_COMMUNITY): Payer: Self-pay | Admitting: Hematology

## 2019-01-20 DIAGNOSIS — Z79899 Other long term (current) drug therapy: Secondary | ICD-10-CM | POA: Diagnosis not present

## 2019-01-20 DIAGNOSIS — M0609 Rheumatoid arthritis without rheumatoid factor, multiple sites: Secondary | ICD-10-CM | POA: Diagnosis not present

## 2019-01-21 DIAGNOSIS — E7800 Pure hypercholesterolemia, unspecified: Secondary | ICD-10-CM | POA: Diagnosis not present

## 2019-01-21 DIAGNOSIS — I10 Essential (primary) hypertension: Secondary | ICD-10-CM | POA: Diagnosis not present

## 2019-01-21 DIAGNOSIS — E11319 Type 2 diabetes mellitus with unspecified diabetic retinopathy without macular edema: Secondary | ICD-10-CM | POA: Diagnosis not present

## 2019-01-21 DIAGNOSIS — E1165 Type 2 diabetes mellitus with hyperglycemia: Secondary | ICD-10-CM | POA: Diagnosis not present

## 2019-01-27 ENCOUNTER — Other Ambulatory Visit (HOSPITAL_COMMUNITY): Payer: Self-pay

## 2019-01-27 DIAGNOSIS — D509 Iron deficiency anemia, unspecified: Secondary | ICD-10-CM

## 2019-01-28 ENCOUNTER — Other Ambulatory Visit: Payer: Self-pay

## 2019-01-28 ENCOUNTER — Inpatient Hospital Stay (HOSPITAL_COMMUNITY): Payer: BC Managed Care – PPO

## 2019-01-28 DIAGNOSIS — D509 Iron deficiency anemia, unspecified: Secondary | ICD-10-CM

## 2019-01-28 DIAGNOSIS — N189 Chronic kidney disease, unspecified: Secondary | ICD-10-CM | POA: Diagnosis not present

## 2019-01-28 DIAGNOSIS — M069 Rheumatoid arthritis, unspecified: Secondary | ICD-10-CM | POA: Diagnosis not present

## 2019-01-28 DIAGNOSIS — Z87891 Personal history of nicotine dependence: Secondary | ICD-10-CM | POA: Diagnosis not present

## 2019-01-28 DIAGNOSIS — I129 Hypertensive chronic kidney disease with stage 1 through stage 4 chronic kidney disease, or unspecified chronic kidney disease: Secondary | ICD-10-CM | POA: Diagnosis not present

## 2019-01-28 DIAGNOSIS — Z7982 Long term (current) use of aspirin: Secondary | ICD-10-CM | POA: Diagnosis not present

## 2019-01-28 DIAGNOSIS — E1122 Type 2 diabetes mellitus with diabetic chronic kidney disease: Secondary | ICD-10-CM | POA: Diagnosis not present

## 2019-01-28 LAB — CBC WITH DIFFERENTIAL/PLATELET
Abs Immature Granulocytes: 0.01 10*3/uL (ref 0.00–0.07)
Basophils Absolute: 0.1 10*3/uL (ref 0.0–0.1)
Basophils Relative: 1 %
Eosinophils Absolute: 0.5 10*3/uL (ref 0.0–0.5)
Eosinophils Relative: 7 %
HCT: 42.7 % (ref 39.0–52.0)
Hemoglobin: 13.2 g/dL (ref 13.0–17.0)
Immature Granulocytes: 0 %
Lymphocytes Relative: 32 %
Lymphs Abs: 2.2 10*3/uL (ref 0.7–4.0)
MCH: 25.9 pg — ABNORMAL LOW (ref 26.0–34.0)
MCHC: 30.9 g/dL (ref 30.0–36.0)
MCV: 83.9 fL (ref 80.0–100.0)
Monocytes Absolute: 0.6 10*3/uL (ref 0.1–1.0)
Monocytes Relative: 8 %
Neutro Abs: 3.5 10*3/uL (ref 1.7–7.7)
Neutrophils Relative %: 52 %
Platelets: 259 10*3/uL (ref 150–400)
RBC: 5.09 MIL/uL (ref 4.22–5.81)
RDW: 22 % — ABNORMAL HIGH (ref 11.5–15.5)
WBC: 6.8 10*3/uL (ref 4.0–10.5)
nRBC: 0 % (ref 0.0–0.2)

## 2019-01-28 LAB — IRON AND TIBC
Iron: 88 ug/dL (ref 45–182)
Saturation Ratios: 24 % (ref 17.9–39.5)
TIBC: 366 ug/dL (ref 250–450)
UIBC: 278 ug/dL

## 2019-01-28 LAB — FERRITIN: Ferritin: 101 ng/mL (ref 24–336)

## 2019-02-02 ENCOUNTER — Encounter (HOSPITAL_COMMUNITY): Payer: Self-pay | Admitting: Hematology

## 2019-02-02 ENCOUNTER — Inpatient Hospital Stay (HOSPITAL_COMMUNITY): Payer: BC Managed Care – PPO | Admitting: Hematology

## 2019-02-02 ENCOUNTER — Other Ambulatory Visit: Payer: Self-pay

## 2019-02-02 DIAGNOSIS — M069 Rheumatoid arthritis, unspecified: Secondary | ICD-10-CM | POA: Diagnosis not present

## 2019-02-02 DIAGNOSIS — I129 Hypertensive chronic kidney disease with stage 1 through stage 4 chronic kidney disease, or unspecified chronic kidney disease: Secondary | ICD-10-CM | POA: Diagnosis not present

## 2019-02-02 DIAGNOSIS — D509 Iron deficiency anemia, unspecified: Secondary | ICD-10-CM | POA: Diagnosis not present

## 2019-02-02 DIAGNOSIS — Z87891 Personal history of nicotine dependence: Secondary | ICD-10-CM | POA: Diagnosis not present

## 2019-02-02 DIAGNOSIS — E1122 Type 2 diabetes mellitus with diabetic chronic kidney disease: Secondary | ICD-10-CM | POA: Diagnosis not present

## 2019-02-02 DIAGNOSIS — N189 Chronic kidney disease, unspecified: Secondary | ICD-10-CM | POA: Diagnosis not present

## 2019-02-02 DIAGNOSIS — Z7982 Long term (current) use of aspirin: Secondary | ICD-10-CM | POA: Diagnosis not present

## 2019-02-02 NOTE — Progress Notes (Signed)
[name], [place] [id number]   CLINIC:  Medical Oncology/Hematology  PCP:  Sharion Balloon, [place] [address] [phone]   REASON FOR VISIT:  Follow-up for iron deficiency anemia.  CURRENT THERAPY: Intermittent Feraheme infusions.   INTERVAL HISTORY:  [name] 63 y.o. male seen for follow-up of iron deficiency anemia.  He received Feraheme infusion on 01/02/2020 22,020.  He tolerated very well.  He did not have any allergic reactions to it.  Appetite is 100%.  Energy levels are 75%.  He reported improvement in his energy levels after he received iron infusion.  He also takes omeprazole for his acid reflux.  He does report right shoulder and right hand pain along with back pain which is stable.  No fevers or night sweats.    REVIEW OF SYSTEMS:  Review of Systems  All other systems reviewed and are negative.    PAST MEDICAL/SURGICAL HISTORY:  Past Medical History:  Diagnosis Date  . Anemia   . Arthritis    rheumatoid arthritis  . Diabetes mellitus   . Hyperlipidemia   . Hypertension   . Primary localized osteoarthritis of right knee 05/13/2015  . Scapho-lunate dissociation, right   . Vitamin D deficiency    Past Surgical History:  Procedure Laterality Date  . CARDIAC CATHETERIZATION  2005   normal  . CARPAL TUNNEL RELEASE  2009   both hands  . HARDWARE REMOVAL Right 11/14/2017   Procedure: REMOVAL ACCUTRAC SCREW RIGHT WRIST;  Surgeon: Daryll Brod, MD;  Location: [facility];  Service: Orthopedics;  Laterality: Right;  . KNEE ARTHROSCOPY     rt  . PARTIAL KNEE ARTHROPLASTY Right 05/13/2015   Procedure: RIGHT UNI KNEE ARTHROPLASTY;  Surgeon: Marchia Bond, MD;  Location: [facility];  Service: Orthopedics;  Laterality: Right;  . scaphoid excision Left 2012   Dr Daylene Katayama  . TONSILLECTOMY    . WRIST FUSION Right 03/14/2017   Procedure: SCAPHOID EXCISION ULNAR FOUR BONE FUSION  RIGHT;  Surgeon: Daryll Brod, MD;  Location: [facility];  Service: Orthopedics;  Laterality: Right;     SOCIAL HISTORY:  Social History   Socioeconomic History  . Marital status: Married    Spouse name: Not on file  . Number of children: 2  . Years of education: Not on file  . Highest education level: Not on file  Occupational History  . Occupation: Disabled  Social Needs  . Financial resource strain: Not on file  . Food insecurity    Worry: Not on file    Inability: Not on file  . Transportation needs    Medical: Not on file    Non-medical: Not on file  Tobacco Use  . Smoking status: Former Smoker    Quit date: 03/05/1991    Years since quitting: 27.9  . Smokeless tobacco: Never Used  Substance and Sexual Activity  . Alcohol use: Yes    Comment: rare  . Drug use: No  . Sexual activity: Not on file  Lifestyle  . Physical activity    Days per week: Not on file    Minutes per session: Not on file  . Stress: Not on file  Relationships  . Social Herbalist on phone: Not on file    Gets together: Not on file    Attends religious service: Not on file    Active member of club or organization: Not on  file    Attends meetings of clubs or organizations: Not on file    Relationship status: Not on file  . Intimate partner violence    Fear of current or ex partner: Not on file    Emotionally abused: Not on file    Physically abused: Not on file    Forced sexual activity: Not on file  Other Topics Concern  . Not on file  Social History Narrative  . Not on file    FAMILY HISTORY:  Family History  Problem Relation Age of Onset  . Diabetes Father   . Hypertension Father   . Heart Problems Father   . Stroke Father   . Diabetes Maternal Grandmother   . Arthritis Sister   . Arthritis Sister   . Arthritis Daughter     CURRENT MEDICATIONS:  Outpatient Encounter Medications as of 02/02/2019  Medication Sig  . abatacept in sodium chloride 0.9  % Inject into the vein every 30 (thirty) days. Reported on 06/27/2015  . aspirin EC 81 MG tablet Take 81 mg by mouth daily.  . Dulaglutide 0.75 MG/0.5ML SOPN Inject into the skin.  [name] glimepiride (AMARYL) 2 MG tablet Take 2 mg by mouth daily.   . hydrochlorothiazide (MICROZIDE) 12.5 MG capsule TAKE (1) CAPSULE DAILY  . losartan (COZAAR) 100 MG tablet TAKE 1 TABLET BY MOUTH  DAILY  . meloxicam (MOBIC) 15 MG tablet Take 15 mg by mouth daily.  . metFORMIN (GLUCOPHAGE) 1000 MG tablet TAKE 1 TABLET IN THE MORNING AND 1&1/2 TABLETS IN THE EVENING  . simvastatin (ZOCOR) 10 MG tablet TAKE 1 TABLET BY MOUTH  DAILY.  [name] oxyCODONE-acetaminophen (PERCOCET) 10-325 MG tablet Take 1-2 tablets by mouth every 6 (six) hours as needed for pain. MAXIMUM TOTAL ACETAMINOPHEN DOSE IS 4000 MG PER DAY (Patient not taking: Reported on 02/02/2019)   No facility-administered encounter medications on file as of 02/02/2019.     ALLERGIES:  Allergies  Allergen Reactions  . Other Shortness Of Breath, Swelling and Hives  . Shellfish Allergy Shortness Of Breath and Swelling  . Leflunomide     LFT ELEVATION  . Methotrexate     LFT elevation  . Doxycycline Nausea And Vomiting  . Invokana [Canagliflozin] Other (See Comments)    Causes bladder infections  . Keflex [Cephalexin]     Pt does not tol-nervous     PHYSICAL EXAM:  ECOG Performance status: 1  Vitals:   02/02/19 1529  BP: 125/69  Pulse: 72  Resp: 18  Temp: 97.9 F (36.6 C)  SpO2: 94%   Filed Weights   02/02/19 1529  Weight: 247 lb 3.2 oz (112.1 kg)    Physical Exam Vitals signs reviewed.  Constitutional:      Appearance: Normal appearance.  Cardiovascular:     Rate and Rhythm: Normal rate and regular rhythm.     Heart sounds: Normal heart sounds.  Pulmonary:     Effort: Pulmonary effort is normal.     Breath sounds: Normal breath sounds.  Abdominal:     General: There is no distension.     Palpations: Abdomen is soft. There is no mass.   Musculoskeletal:        General: No swelling.  Skin:    General: Skin is warm.  Neurological:     General: No focal deficit present.     Mental Status: He is alert and oriented to person, place, and time.  Psychiatric:        Mood and Affect: Mood  normal.        Behavior: Behavior normal.      LABORATORY DATA:  I have reviewed the labs as listed.  CBC    Component Value Date/Time   WBC 6.8 01/28/2019 1219   RBC 5.09 01/28/2019 1219   HGB 13.2 01/28/2019 1219   HGB 9.5 (L) 12/05/2018 0830   HCT 42.7 01/28/2019 1219   HCT 33.5 (L) 12/05/2018 0830   PLT 259 01/28/2019 1219   PLT 324 12/05/2018 0830   MCV 83.9 01/28/2019 1219   MCV 74 (L) 12/05/2018 0830   MCH 25.9 (L) 01/28/2019 1219   MCHC 30.9 01/28/2019 1219   RDW 22.0 (H) 01/28/2019 1219   RDW 15.3 12/05/2018 0830   LYMPHSABS 2.2 01/28/2019 1219   LYMPHSABS 2.4 12/05/2018 0830   MONOABS 0.6 01/28/2019 1219   EOSABS 0.5 01/28/2019 1219   EOSABS 0.8 (H) 12/05/2018 0830   BASOSABS 0.1 01/28/2019 1219   BASOSABS 0.1 12/05/2018 0830   CMP Latest Ref Rng & Units 12/05/2018 11/18/2018 08/12/2018  Glucose 65 - 99 mg/dL 140(H) 126(H) 130(H)  BUN 8 - 27 mg/dL 15 13 15   Creatinine 0.76 - 1.27 mg/dL 1.32(H) 1.47(H) 1.09  Sodium 134 - 144 mmol/L 139 139 138  Potassium 3.5 - 5.2 mmol/L 4.2 4.6 4.5  Chloride 96 - 106 mmol/L 101 100 101  CO2 20 - 29 mmol/L 22 21 21   Calcium 8.6 - 10.2 mg/dL 9.0 9.4 9.7  Total Protein 6.0 - 8.5 g/dL - 6.9 6.8  Total Bilirubin 0.0 - 1.2 mg/dL - <0.2 0.2  Alkaline Phos 39 - 117 IU/L - 62 56  AST 0 - 40 IU/L - 24 25  ALT 0 - 44 IU/L - 23 22       DIAGNOSTIC IMAGING:  I have independently reviewed the scans and discussed with the patient.     ASSESSMENT & PLAN:   Microcytic anemia 1.  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: -He was taking iron tablet for the last month and complains of constipation.  He also takes omeprazole 3 times a week. -His last ferritin was 7 with percent  saturation of 6.  He also had CKD with creatinine around 1.3. -He never had a colonoscopy.  Denies any pica.  Denies any B symptoms. -He received Feraheme on 01/02/2019 and 01/09/2019. -He felt improvement in his energy levels after the iron infusion. -We reviewed labs dated 01/28/2019 which showed hemoglobin improved to 13.2 from 9.5 previously.  MCV improved to 83 from 74.  Ferritin improved to 101 from 6. -I have strongly recommended colonoscopy.  He will talk to Evelina Dun, FNP at next visit. I will see him back in 3 months with repeat CBC, ferritin and iron panel.  2.  Rheumatoid arthritis: -He is on Orencia every 4 weeks.  3.  Tobacco abuse: -He quit smoking in 1992.  He smoked 1 and half pack per day for 20 years.      Orders placed this encounter:  No orders of the defined types were placed in this encounter.     Derek Jack, MD [place] [phone]

## 2019-02-02 NOTE — Patient Instructions (Signed)
[facility] at [facility] Discharge Instructions  You were seen today by Dr. Katragadda. He went over your recent lab results. He will see you back in 3 months for labs and follow up.   Thank you for choosing [facility] at [facility] to provide your oncology and hematology care.  To afford each patient quality time with our provider, please arrive at least 15 minutes before your scheduled appointment time.   If you have a lab appointment with the Cancer Center please come in thru the  Main Entrance and check in at the main information desk  You need to re-schedule your appointment should you arrive 10 or more minutes late.  We strive to give you quality time with our providers, and arriving late affects you and other patients whose appointments are after yours.  Also, if you no show three or more times for appointments you may be dismissed from the clinic at the providers discretion.     Again, thank you for choosing [facility].  Our hope is that these requests will decrease the amount of time that you wait before being seen by our physicians.       _____________________________________________________________  Should you have questions after your visit to [facility], please contact our office at [phone] between the hours of 8:00 a.m. and 4:30 p.m.  Voicemails left after 4:00 p.m. will not be returned until the following business day.  For prescription refill requests, have your pharmacy contact our office and allow 72 hours.    Cancer Center Support Programs:   > Cancer Support Group  2nd Tuesday of the month 1pm-2pm, Journey Room    

## 2019-02-02 NOTE — Assessment & Plan Note (Signed)
1.  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: -He was taking iron tablet for the last month and complains of constipation.  He also takes omeprazole 3 times a week. -His last ferritin was 7 with percent saturation of 6.  He also had CKD with creatinine around 1.3. -He never had a colonoscopy.  Denies any pica.  Denies any B symptoms. -He received Feraheme on 01/02/2019 and 01/09/2019. -He felt improvement in his energy levels after the iron infusion. -We reviewed labs dated 01/28/2019 which showed hemoglobin improved to 13.2 from 9.5 previously.  MCV improved to 83 from 74.  Ferritin improved to 101 from 6. -I have strongly recommended colonoscopy.  He will talk to Evelina Dun, FNP at next visit. I will see him back in 3 months with repeat CBC, ferritin and iron panel.  2.  Rheumatoid arthritis: -He is on Orencia every 4 weeks.  3.  Tobacco abuse: -He quit smoking in 1992.  He smoked 1 and half pack per day for 20 years.

## 2019-02-10 DIAGNOSIS — M545 Low back pain: Secondary | ICD-10-CM | POA: Diagnosis not present

## 2019-02-10 DIAGNOSIS — G8929 Other chronic pain: Secondary | ICD-10-CM | POA: Diagnosis not present

## 2019-02-10 DIAGNOSIS — Z79899 Other long term (current) drug therapy: Secondary | ICD-10-CM | POA: Diagnosis not present

## 2019-02-10 DIAGNOSIS — G894 Chronic pain syndrome: Secondary | ICD-10-CM | POA: Diagnosis not present

## 2019-02-17 DIAGNOSIS — M0609 Rheumatoid arthritis without rheumatoid factor, multiple sites: Secondary | ICD-10-CM | POA: Diagnosis not present

## 2019-03-17 DIAGNOSIS — M0609 Rheumatoid arthritis without rheumatoid factor, multiple sites: Secondary | ICD-10-CM | POA: Diagnosis not present

## 2019-03-18 ENCOUNTER — Other Ambulatory Visit: Payer: Self-pay | Admitting: Family

## 2019-03-18 DIAGNOSIS — J069 Acute upper respiratory infection, unspecified: Secondary | ICD-10-CM

## 2019-03-20 ENCOUNTER — Telehealth: Payer: Self-pay | Admitting: *Deleted

## 2019-03-20 NOTE — Telephone Encounter (Signed)
I don't see a medication to refill?

## 2019-03-25 ENCOUNTER — Other Ambulatory Visit: Payer: Self-pay | Admitting: *Deleted

## 2019-03-25 MED ORDER — MELOXICAM 15 MG PO TABS: 15.0000 mg | ORAL_TABLET | Freq: Every day | ORAL | 1 refills | Status: DC

## 2019-03-26 ENCOUNTER — Other Ambulatory Visit: Payer: Self-pay

## 2019-03-27 ENCOUNTER — Ambulatory Visit: Payer: BC Managed Care – PPO | Admitting: Family

## 2019-03-27 ENCOUNTER — Encounter: Payer: Self-pay | Admitting: Family

## 2019-03-27 VITALS — BP 158/93 | HR 67 | Temp 98.7°F | Ht 70.0 in | Wt 249.0 lb

## 2019-03-27 DIAGNOSIS — B3789 Other sites of candidiasis: Secondary | ICD-10-CM | POA: Diagnosis not present

## 2019-03-27 MED ORDER — NYSTATIN 100000 UNIT/GM EX POWD: 1.0000 "application " | Freq: Three times a day (TID) | CUTANEOUS | 2 refills | Status: DC

## 2019-03-27 MED ORDER — NYSTATIN 100000 UNIT/GM EX OINT: 1.0000 "application " | TOPICAL_OINTMENT | Freq: Two times a day (BID) | CUTANEOUS | 0 refills | Status: DC

## 2019-03-27 NOTE — Progress Notes (Signed)
   Subjective:    Patient ID: [name], male    DOB: 1956-02-17, 63 y.o.   MRN: [id number]  Chief Complaint  Patient presents with  . sore on scalp    Rash This is a recurrent problem. The current episode started 1 to 4 weeks ago. The problem has been waxing and waning since onset. Location: groin. The rash is characterized by itchiness and pain. He was exposed to nothing. Pertinent negatives include no anorexia. Treatments tried: baby powder. The treatment provided mild relief.      Review of Systems  Gastrointestinal: Negative for anorexia.  Skin: Positive for rash.  All other systems reviewed and are negative.      Objective:   Physical Exam Vitals reviewed.  Constitutional:      General: He is not in acute distress.    Appearance: He is well-developed.  HENT:     Head: Normocephalic.  Eyes:     General:        Right eye: No discharge.        Left eye: No discharge.     Pupils: Pupils are equal, round, and reactive to light.  Neck:     Thyroid: No thyromegaly.  Cardiovascular:     Rate and Rhythm: Normal rate and regular rhythm.     Heart sounds: Normal heart sounds. No murmur.  Pulmonary:     Effort: Pulmonary effort is normal. No respiratory distress.     Breath sounds: Normal breath sounds. No wheezing.  Abdominal:     General: Bowel sounds are normal. There is no distension.     Palpations: Abdomen is soft.     Tenderness: There is no abdominal tenderness.  Musculoskeletal:        General: No tenderness. Normal range of motion.     Cervical back: Normal range of motion and neck supple.  Skin:    General: Skin is warm and dry.     Findings: Rash (bilateral erythemas rash in groin) present. No erythema.  Neurological:     Mental Status: He is alert and oriented to person, place, and time.     Cranial Nerves: No cranial nerve deficit.     Deep Tendon Reflexes: Reflexes are normal and symmetric.  Psychiatric:        Behavior: Behavior normal.      Thought Content: Thought content normal.        Judgment: Judgment normal.     BP (!) 158/93   Pulse 67   Temp 98.7 F (37.1 C) (Temporal)   Ht 5\' 10"  (1.778 m)   Wt 249 lb (112.9 kg)   SpO2 98%   BMI 35.73 kg/m        Assessment & Plan:  [name] comes in today with chief complaint of Rash   Diagnosis and orders addressed:  1. Candida rash of groin Keep clean and dry Avoid moist areas Call if symptoms worsen or do not improve Continue to have good control of gluocse - nystatin (MYCOSTATIN/NYSTOP) powder; Apply 1 application topically 3 (three) times daily.  Dispense: 60 g; Refill: 2 - nystatin ointment (MYCOSTATIN); Apply 1 application topically 2 (two) times daily.  Dispense: 30 g; Refill: 0  Evelina Dun, FNP

## 2019-03-27 NOTE — Patient Instructions (Signed)
Skin Yeast Infection  A skin yeast infection is a condition in which there is an overgrowth of yeast (candida) that normally lives on the skin. This condition usually occurs in areas of the skin that are constantly warm and moist, such as the armpits or the groin. What are the causes? This condition is caused by a change in the normal balance of the yeast and bacteria that live on the skin. What increases the risk? You are more likely to develop this condition if you:  Are obese.  Are pregnant.  Take birth control pills.  Have diabetes.  Take antibiotic medicines.  Take steroid medicines.  Are malnourished.  Have a weak body defense system (immune system).  Are 71 years of age or older.  Wear tight clothing. What are the signs or symptoms? The most common symptom of this condition is itchiness in the affected area. Other symptoms include:  Red, swollen area of the skin.  Bumps on the skin. How is this diagnosed?  This condition is diagnosed with a medical history and physical exam.  Your health care provider may check for yeast by taking light scrapings of the skin to be viewed under a microscope. How is this treated? This condition is treated with medicine. Medicines may be prescribed or be available over the counter. The medicines may be:  Taken by mouth (orally).  Applied as a cream or powder to your skin. Follow these instructions at home:   Take or apply over-the-counter and prescription medicines only as told by your health care provider.  Maintain a healthy weight. If you need help losing weight, talk with your health care provider.  Keep your skin clean and dry.  If you have diabetes, keep your blood sugar under control.  Keep all follow-up visits as told by your health care provider. This is important. Contact a health care provider if:  Your symptoms go away and then return.  Your symptoms do not get better with treatment.  Your symptoms get  worse.  Your rash spreads.  You have a fever or chills.  You have new symptoms.  You have new warmth or redness of your skin. Summary  A skin yeast infection is a condition in which there is an overgrowth of yeast (candida) that normally lives on the skin. This condition is caused by a change in the normal balance of the yeast and bacteria that live on the skin.  Take or apply over-the-counter and prescription medicines only as told by your health care provider.  Keep your skin clean and dry.  Contact a health care provider if your symptoms do not get better with treatment. This information is not intended to replace advice given to you by your health care provider. Make sure you discuss any questions you have with your health care provider. Document Released: 12/12/2010 Document Revised: 08/13/2017 Document Reviewed: 08/13/2017 Elsevier Patient Education  2020 [employer].

## 2019-04-16 ENCOUNTER — Other Ambulatory Visit: Payer: Self-pay | Admitting: Cardiovascular Disease

## 2019-04-16 ENCOUNTER — Other Ambulatory Visit: Payer: Self-pay | Admitting: Family

## 2019-04-16 DIAGNOSIS — J069 Acute upper respiratory infection, unspecified: Secondary | ICD-10-CM

## 2019-04-17 NOTE — Telephone Encounter (Signed)
OV 05/28/19

## 2019-04-28 ENCOUNTER — Inpatient Hospital Stay (HOSPITAL_COMMUNITY): Payer: Medicare Other | Attending: Hematology

## 2019-04-28 ENCOUNTER — Other Ambulatory Visit: Payer: Self-pay

## 2019-04-28 DIAGNOSIS — Z7982 Long term (current) use of aspirin: Secondary | ICD-10-CM | POA: Diagnosis not present

## 2019-04-28 DIAGNOSIS — Z87891 Personal history of nicotine dependence: Secondary | ICD-10-CM | POA: Insufficient documentation

## 2019-04-28 DIAGNOSIS — E119 Type 2 diabetes mellitus without complications: Secondary | ICD-10-CM | POA: Insufficient documentation

## 2019-04-28 DIAGNOSIS — Z7984 Long term (current) use of oral hypoglycemic drugs: Secondary | ICD-10-CM | POA: Insufficient documentation

## 2019-04-28 DIAGNOSIS — D509 Iron deficiency anemia, unspecified: Secondary | ICD-10-CM | POA: Diagnosis not present

## 2019-04-28 DIAGNOSIS — Z79899 Other long term (current) drug therapy: Secondary | ICD-10-CM | POA: Insufficient documentation

## 2019-04-28 DIAGNOSIS — I10 Essential (primary) hypertension: Secondary | ICD-10-CM | POA: Diagnosis not present

## 2019-04-28 DIAGNOSIS — E785 Hyperlipidemia, unspecified: Secondary | ICD-10-CM | POA: Diagnosis not present

## 2019-04-28 DIAGNOSIS — M069 Rheumatoid arthritis, unspecified: Secondary | ICD-10-CM | POA: Diagnosis not present

## 2019-04-28 LAB — CBC WITH DIFFERENTIAL/PLATELET
Abs Immature Granulocytes: 0.02 10*3/uL (ref 0.00–0.07)
Basophils Absolute: 0.1 10*3/uL (ref 0.0–0.1)
Basophils Relative: 1 %
Eosinophils Absolute: 0.8 10*3/uL — ABNORMAL HIGH (ref 0.0–0.5)
Eosinophils Relative: 9 %
HCT: 46 % (ref 39.0–52.0)
Hemoglobin: 15.4 g/dL (ref 13.0–17.0)
Immature Granulocytes: 0 %
Lymphocytes Relative: 31 %
Lymphs Abs: 2.5 10*3/uL (ref 0.7–4.0)
MCH: 30.7 pg (ref 26.0–34.0)
MCHC: 33.5 g/dL (ref 30.0–36.0)
MCV: 91.6 fL (ref 80.0–100.0)
Monocytes Absolute: 0.5 10*3/uL (ref 0.1–1.0)
Monocytes Relative: 6 %
Neutro Abs: 4.2 10*3/uL (ref 1.7–7.7)
Neutrophils Relative %: 53 %
Platelets: 277 10*3/uL (ref 150–400)
RBC: 5.02 MIL/uL (ref 4.22–5.81)
RDW: 13.2 % (ref 11.5–15.5)
WBC: 8.1 10*3/uL (ref 4.0–10.5)
nRBC: 0 % (ref 0.0–0.2)

## 2019-04-28 LAB — COMPREHENSIVE METABOLIC PANEL
ALT: 30 U/L (ref 0–44)
AST: 28 U/L (ref 15–41)
Albumin: 4.2 g/dL (ref 3.5–5.0)
Alkaline Phosphatase: 57 U/L (ref 38–126)
Anion gap: 10 (ref 5–15)
BUN: 15 mg/dL (ref 8–23)
CO2: 25 mmol/L (ref 22–32)
Calcium: 9.2 mg/dL (ref 8.9–10.3)
Chloride: 102 mmol/L (ref 98–111)
Creatinine, Ser: 1.11 mg/dL (ref 0.61–1.24)
GFR calc Af Amer: >60 mL/min (ref >60)
GFR calc non Af Amer: >60 mL/min (ref >60)
Glucose, Bld: 152 mg/dL — ABNORMAL HIGH (ref 70–99)
Potassium: 4 mmol/L (ref 3.5–5.1)
Sodium: 137 mmol/L (ref 135–145)
Total Bilirubin: 0.7 mg/dL (ref 0.3–1.2)
Total Protein: 7.6 g/dL (ref 6.5–8.1)

## 2019-05-04 ENCOUNTER — Inpatient Hospital Stay (HOSPITAL_BASED_OUTPATIENT_CLINIC_OR_DEPARTMENT_OTHER): Payer: Medicare Other | Admitting: Hematology

## 2019-05-04 ENCOUNTER — Other Ambulatory Visit: Payer: Self-pay

## 2019-05-04 ENCOUNTER — Encounter (HOSPITAL_COMMUNITY): Payer: Self-pay | Admitting: Hematology

## 2019-05-04 VITALS — BP 144/72 | HR 67 | Temp 97.8°F | Resp 18 | Wt 250.7 lb

## 2019-05-04 DIAGNOSIS — D509 Iron deficiency anemia, unspecified: Secondary | ICD-10-CM | POA: Diagnosis not present

## 2019-05-04 MED ORDER — OCTREOTIDE ACETATE 30 MG IM KIT
PACK | INTRAMUSCULAR | Status: AC
  Filled 2019-05-04: qty 1

## 2019-05-04 MED ORDER — FULVESTRANT 250 MG/5ML IM SOLN
INTRAMUSCULAR | Status: AC
  Filled 2019-05-04: qty 5

## 2019-05-04 NOTE — Progress Notes (Signed)
[place], [place] [address]   CLINIC:  Medical Oncology/Hematology  PCP:  Sharion Balloon, [place] [address] [phone]   REASON FOR VISIT:  Follow-up for iron deficiency anemia.  CURRENT THERAPY: Intermittent Feraheme infusions.   INTERVAL HISTORY:  [name] 64 y.o. male seen for follow-up of iron deficiency anemia.  His last Feraheme infusion was on 01/09/2019.  His energy levels improved after that.  Denies any bleeding per rectum or melena.  Appetite is 100%.  Energy levels are 75%.  Rheumatoid arthritis is stable.    REVIEW OF SYSTEMS:  Review of Systems  Musculoskeletal: Positive for arthralgias.  All other systems reviewed and are negative.    PAST MEDICAL/SURGICAL HISTORY:  Past Medical History:  Diagnosis Date  . Anemia   . Arthritis    rheumatoid arthritis  . Diabetes mellitus   . Hyperlipidemia   . Hypertension   . Primary localized osteoarthritis of right knee 05/13/2015  . Scapho-lunate dissociation, right   . Vitamin D deficiency    Past Surgical History:  Procedure Laterality Date  . CARDIAC CATHETERIZATION  2005   normal  . CARPAL TUNNEL RELEASE  2009   both hands  . HARDWARE REMOVAL Right 11/14/2017   Procedure: REMOVAL ACCUTRAC SCREW RIGHT WRIST;  Surgeon: Daryll Brod, MD;  Location: [facility];  Service: Orthopedics;  Laterality: Right;  . KNEE ARTHROSCOPY     rt  . PARTIAL KNEE ARTHROPLASTY Right 05/13/2015   Procedure: RIGHT UNI KNEE ARTHROPLASTY;  Surgeon: Marchia Bond, MD;  Location: [facility];  Service: Orthopedics;  Laterality: Right;  . scaphoid excision Left 2012   Dr Daylene Katayama  . TONSILLECTOMY    . WRIST FUSION Right 03/14/2017   Procedure: SCAPHOID EXCISION ULNAR FOUR BONE FUSION RIGHT;  Surgeon: Daryll Brod, MD;  Location: [facility];  Service: Orthopedics;  Laterality: Right;     SOCIAL HISTORY:  Social History    Socioeconomic History  . Marital status: Married    Spouse name: Not on file  . Number of children: 2  . Years of education: Not on file  . Highest education level: Not on file  Occupational History  . Occupation: Disabled  Tobacco Use  . Smoking status: Former Smoker    Quit date: 03/05/1991    Years since quitting: 28.1  . Smokeless tobacco: Never Used  Substance and Sexual Activity  . Alcohol use: Yes    Comment: rare  . Drug use: No  . Sexual activity: Not on file  Other Topics Concern  . Not on file  Social History Narrative  . Not on file   Social Determinants of Health   Financial Resource Strain:   . Difficulty of Paying Living Expenses: Not on file  Food Insecurity:   . Worried About Charity fundraiser in the Last Year: Not on file  . Ran Out of Food in the Last Year: Not on file  Transportation Needs:   . Lack of Transportation (Medical): Not on file  . Lack of Transportation (Non-Medical): Not on file  Physical Activity:   . Days of Exercise per Week: Not on file  . Minutes of Exercise per Session: Not on file  Stress:   . Feeling of Stress : Not on file  Social Connections:   . Frequency of Communication with Friends and Family: Not on file  . Frequency of Social Gatherings with Friends and  Family: Not on file  . Attends Religious Services: Not on file  . Active Member of Clubs or Organizations: Not on file  . Attends Archivist Meetings: Not on file  . Marital Status: Not on file  Intimate Partner Violence:   . Fear of Current or Ex-Partner: Not on file  . Emotionally Abused: Not on file  . Physically Abused: Not on file  . Sexually Abused: Not on file    FAMILY HISTORY:  Family History  Problem Relation Age of [facility]  . Diabetes Father   . Hypertension Father   . Heart Problems Father   . Stroke Father   . Diabetes Maternal Grandmother   . Arthritis Sister   . Arthritis Sister   . Arthritis Daughter     CURRENT MEDICATIONS:   Outpatient Encounter Medications as of 05/04/2019  Medication Sig  . abatacept in sodium chloride 0.9 % Inject into the vein every 30 (thirty) days. Reported on 06/27/2015  . aspirin EC 81 MG tablet Take 81 mg by mouth daily.  . Dulaglutide 0.75 MG/0.5ML SOPN Inject into the skin.  [name] glimepiride (AMARYL) 2 MG tablet Take 2 mg by mouth daily.   . hydrochlorothiazide (MICROZIDE) 12.5 MG capsule TAKE (1) CAPSULE DAILY  . losartan (COZAAR) 100 MG tablet TAKE 1 TABLET BY MOUTH  DAILY  . meloxicam (MOBIC) 15 MG tablet Take 1 tablet (15 mg total) by mouth daily.  . metFORMIN (GLUCOPHAGE) 1000 MG tablet TAKE 1 TABLET IN THE MORNING AND 1&1/2 TABLETS IN THE EVENING  . nystatin (MYCOSTATIN/NYSTOP) powder Apply 1 application topically 3 (three) times daily.  [name] nystatin ointment (MYCOSTATIN) Apply 1 application topically 2 (two) times daily.  . simvastatin (ZOCOR) 10 MG tablet TAKE 1 TABLET BY MOUTH  DAILY  . oxyCODONE-acetaminophen (PERCOCET) 10-325 MG tablet Take 1-2 tablets by mouth every 6 (six) hours as needed for pain. MAXIMUM TOTAL ACETAMINOPHEN DOSE IS 4000 MG PER DAY (Patient not taking: Reported on 05/04/2019)   No facility-administered encounter medications on file as of 05/04/2019.    ALLERGIES:  Allergies  Allergen Reactions  . Other Shortness Of Breath, Swelling and Hives  . Shellfish Allergy Shortness Of Breath and Swelling  . Leflunomide     LFT ELEVATION  . Methotrexate     LFT elevation  . Doxycycline Nausea And Vomiting  . Invokana [Canagliflozin] Other (See Comments)    Causes bladder infections  . Keflex [Cephalexin]     Pt does not tol-nervous     PHYSICAL EXAM:  ECOG Performance status: 1  Vitals:   05/04/19 0802  BP: (!) 144/72  Pulse: 67  Resp: 18  Temp: 97.8 F (36.6 C)  SpO2: 98%   Filed Weights   05/04/19 0802  Weight: 250 lb 11.2 oz (113.7 kg)    Physical Exam Vitals reviewed.  Constitutional:      Appearance: Normal appearance.  Cardiovascular:      Rate and Rhythm: Normal rate and regular rhythm.     Heart sounds: Normal heart sounds.  Pulmonary:     Effort: Pulmonary effort is normal.     Breath sounds: Normal breath sounds.  Abdominal:     General: There is no distension.     Palpations: Abdomen is soft. There is no mass.  Musculoskeletal:        General: No swelling.  Skin:    General: Skin is warm.  Neurological:     General: No focal deficit present.     Mental Status:  He is alert and oriented to person, place, and time.  Psychiatric:        Mood and Affect: Mood normal.        Behavior: Behavior normal.      LABORATORY DATA:  I have reviewed the labs as listed.  CBC    Component Value Date/Time   WBC 8.1 04/28/2019 0942   RBC 5.02 04/28/2019 0942   HGB 15.4 04/28/2019 0942   HGB 9.5 (L) 12/05/2018 0830   HCT 46.0 04/28/2019 0942   HCT 33.5 (L) 12/05/2018 0830   PLT 277 04/28/2019 0942   PLT 324 12/05/2018 0830   MCV 91.6 04/28/2019 0942   MCV 74 (L) 12/05/2018 0830   MCH 30.7 04/28/2019 0942   MCHC 33.5 04/28/2019 0942   RDW 13.2 04/28/2019 0942   RDW 15.3 12/05/2018 0830   LYMPHSABS 2.5 04/28/2019 0942   LYMPHSABS 2.4 12/05/2018 0830   MONOABS 0.5 04/28/2019 0942   EOSABS 0.8 (H) 04/28/2019 0942   EOSABS 0.8 (H) 12/05/2018 0830   BASOSABS 0.1 04/28/2019 0942   BASOSABS 0.1 12/05/2018 0830   CMP Latest Ref Rng & Units 04/28/2019 12/05/2018 11/18/2018  Glucose 70 - 99 mg/dL 152(H) 140(H) 126(H)  BUN 8 - 23 mg/dL 15 15 13   Creatinine 0.61 - 1.24 mg/dL 1.11 1.32(H) 1.47(H)  Sodium 135 - 145 mmol/L 137 139 139  Potassium 3.5 - 5.1 mmol/L 4.0 4.2 4.6  Chloride 98 - 111 mmol/L 102 101 100  CO2 22 - 32 mmol/L 25 22 21   Calcium 8.9 - 10.3 mg/dL 9.2 9.0 9.4  Total Protein 6.5 - 8.1 g/dL 7.6 - 6.9  Total Bilirubin 0.3 - 1.2 mg/dL 0.7 - <0.2  Alkaline Phos 38 - 126 U/L 57 - 62  AST 15 - 41 U/L 28 - 24  ALT 0 - 44 U/L 30 - 23       DIAGNOSTIC IMAGING:  I have independently reviewed the scans and  discussed with the patient.     ASSESSMENT & PLAN:   Microcytic anemia 1.  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: -Never had screening colonoscopy. -Last Feraheme infusion on 01/01/2019 and 01/09/2019. -We reviewed labs from 04/28/2019.  Hemoglobin improved to 15.4. -He will talk to Evelina Dun, FNP about colonoscopy. -I plan to see him back in 4 months with repeat labs, ferritin and iron  2.  Rheumatoid arthritis: -He is on Orencia every 4 weeks.  3.  Tobacco abuse: -He quit smoking in 1992.  He smoked 1 and half pack per day for 20 years.      Orders placed this encounter:  Orders Placed This Encounter  Procedures  . CBC with Differential/Platelet  . Comprehensive metabolic panel  . Iron and TIBC  . Ferritin  . Vitamin B12  . Folate      Derek Jack, MD [place] [phone]

## 2019-05-04 NOTE — Patient Instructions (Addendum)
[facility] at [facility] Discharge Instructions  You were seen today by Dr. Katragadda. He went over your recent lab results. He will see you back in 4 months for labs and follow up.   Thank you for choosing [facility] at [facility] to provide your oncology and hematology care.  To afford each patient quality time with our provider, please arrive at least 15 minutes before your scheduled appointment time.   If you have a lab appointment with the Cancer Center please come in thru the  Main Entrance and check in at the main information desk  You need to re-schedule your appointment should you arrive 10 or more minutes late.  We strive to give you quality time with our providers, and arriving late affects you and other patients whose appointments are after yours.  Also, if you no show three or more times for appointments you may be dismissed from the clinic at the providers discretion.     Again, thank you for choosing [facility].  Our hope is that these requests will decrease the amount of time that you wait before being seen by our physicians.       _____________________________________________________________  Should you have questions after your visit to [facility], please contact our office at [phone] between the hours of 8:00 a.m. and 4:30 p.m.  Voicemails left after 4:00 p.m. will not be returned until the following business day.  For prescription refill requests, have your pharmacy contact our office and allow 72 hours.    Cancer Center Support Programs:   > Cancer Support Group  2nd Tuesday of the month 1pm-2pm, Journey Room    

## 2019-05-04 NOTE — Assessment & Plan Note (Signed)
1.  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: -Never had screening colonoscopy. -Last Feraheme infusion on 01/01/2019 and 01/09/2019. -We reviewed labs from 04/28/2019.  Hemoglobin improved to 15.4. -He will talk to Evelina Dun, FNP about colonoscopy. -I plan to see him back in 4 months with repeat labs, ferritin and iron  2.  Rheumatoid arthritis: -He is on Orencia every 4 weeks.  3.  Tobacco abuse: -He quit smoking in 1992.  He smoked 1 and half pack per day for 20 years.

## 2019-05-15 ENCOUNTER — Other Ambulatory Visit: Payer: Self-pay | Admitting: Family

## 2019-05-15 DIAGNOSIS — J069 Acute upper respiratory infection, unspecified: Secondary | ICD-10-CM

## 2019-05-28 ENCOUNTER — Ambulatory Visit (INDEPENDENT_AMBULATORY_CARE_PROVIDER_SITE_OTHER): Payer: Medicare Other | Admitting: Family

## 2019-05-28 ENCOUNTER — Encounter: Payer: Self-pay | Admitting: Family

## 2019-05-28 ENCOUNTER — Other Ambulatory Visit: Payer: Self-pay

## 2019-05-28 DIAGNOSIS — E1159 Type 2 diabetes mellitus with other circulatory complications: Secondary | ICD-10-CM | POA: Diagnosis not present

## 2019-05-28 DIAGNOSIS — M059 Rheumatoid arthritis with rheumatoid factor, unspecified: Secondary | ICD-10-CM | POA: Diagnosis not present

## 2019-05-28 DIAGNOSIS — I10 Essential (primary) hypertension: Secondary | ICD-10-CM | POA: Diagnosis not present

## 2019-05-28 DIAGNOSIS — E1169 Type 2 diabetes mellitus with other specified complication: Secondary | ICD-10-CM | POA: Diagnosis not present

## 2019-05-28 DIAGNOSIS — M1711 Unilateral primary osteoarthritis, right knee: Secondary | ICD-10-CM

## 2019-05-28 DIAGNOSIS — E785 Hyperlipidemia, unspecified: Secondary | ICD-10-CM

## 2019-05-28 DIAGNOSIS — I152 Hypertension secondary to endocrine disorders: Secondary | ICD-10-CM

## 2019-05-28 DIAGNOSIS — Z1211 Encounter for screening for malignant neoplasm of colon: Secondary | ICD-10-CM

## 2019-05-28 DIAGNOSIS — E1165 Type 2 diabetes mellitus with hyperglycemia: Secondary | ICD-10-CM

## 2019-05-28 MED ORDER — METFORMIN HCL 1000 MG PO TABS: ORAL_TABLET | ORAL | 3 refills | Status: DC

## 2019-05-28 NOTE — Progress Notes (Signed)
Virtual Visit via telephone Note Due to COVID-19 pandemic this visit was conducted virtually. This visit type was conducted due to national recommendations for restrictions regarding the COVID-19 Pandemic (e.g. social distancing, sheltering in place) in an effort to limit this patient's exposure and mitigate transmission in our community. All issues noted in this document were discussed and addressed.  A physical exam was not performed with this format.  I connected with [name] on 05/28/19 at 8:10 AM by telephone and verified that I am speaking with the correct person using two identifiers. [name] is currently located at home and wife is currently with him during visit. The provider, Evelina Dun, FNP is located in their office at time of visit.  I discussed the limitations, risks, security and privacy concerns of performing an evaluation and management service by telephone and the availability of in person appointments. I also discussed with the patient that there may be a patient responsible charge related to this service. The patient expressed understanding and agreed to proceed.   History and Present Illness:  PT calls to the office today for chronic follow up. Pt has rheumatoid arthritis and followed by rheumatologists every 3 months.Pt is followed by Cardiologists annually. He is followed by Hematologists for iron deficiency.   Diabetes He presents for his follow-up diabetic visit. He has type 2 diabetes mellitus. His disease course has been stable. There are no hypoglycemic associated symptoms. Pertinent negatives for diabetes include no blurred vision and no foot paresthesias. Symptoms are stable. Diabetic complications include heart disease. Pertinent negatives for diabetic complications include no nephropathy or peripheral neuropathy. Risk factors for coronary artery disease include dyslipidemia, hypertension, male sex and sedentary lifestyle. He is following a generally  healthy diet. (Does not check blood glucose at home ) An ACE inhibitor/angiotensin II receptor blocker is being taken. Eye exam is current.  Hypertension This is a chronic problem. The current episode started more than 1 year ago. The problem has been resolved since onset. The problem is controlled. Pertinent negatives include no blurred vision or malaise/fatigue. Risk factors for coronary artery disease include obesity, male gender, sedentary lifestyle, dyslipidemia and diabetes mellitus. The current treatment provides moderate improvement. Hypertensive end-organ damage includes CAD/MI.  Arthritis Presents for follow-up visit. He complains of pain and stiffness. The symptoms have been stable. His pain is at a severity of 7/10.  Hyperlipidemia This is a chronic problem. The current episode started more than 1 year ago. The problem is controlled. Recent lipid tests were reviewed and are normal. Current antihyperlipidemic treatment includes statins. The current treatment provides moderate improvement of lipids. Risk factors for coronary artery disease include dyslipidemia, male sex, hypertension and a sedentary lifestyle.  Anemia Presents for follow-up visit. There has been no bruising/bleeding easily or malaise/fatigue.      Review of Systems  Constitutional: Negative for malaise/fatigue.  Eyes: Negative for blurred vision.  Musculoskeletal: Positive for arthritis and stiffness.  Endo/Heme/Allergies: Does not bruise/bleed easily.  All other systems reviewed and are negative.    Observations/Objective: No SOB or distress   Assessment and Plan: [name] comes in today with chief complaint of No chief complaint on file.   Diagnosis and orders addressed:  1. Hypertension associated with diabetes ([place]) - CMP14+EGFR; Future - CBC with Differential/Platelet; Future  2. Type 2 diabetes mellitus with hyperglycemia, without long-term current use of insulin ([facility]) - CMP14+EGFR;  Future - CBC with Differential/Platelet; Future - Bayer DCA Hb A1c Waived; Future -  metFORMIN (GLUCOPHAGE) 1000 MG tablet; TAKE 1 TABLET IN THE MORNING AND 1&1/2 TABLETS IN THE EVENING  Dispense: 75 tablet; Refill: 3  3. Hyperlipidemia associated with type 2 diabetes mellitus (HCC) - CMP14+EGFR; Future - CBC with Differential/Platelet; Future  4. Rheumatoid arthritis with positive rheumatoid factor, involving unspecified site ([place]) - CMP14+EGFR; Future - CBC with Differential/Platelet; Future  5. Morbid obesity ([place]) - CMP14+EGFR; Future - CBC with Differential/Platelet; Future  6. Primary localized osteoarthritis of right knee - CMP14+EGFR; Future - CBC with Differential/Platelet; Future  7. Colon cancer screening - Ambulatory referral to Gastroenterology - CMP14+EGFR; Future - CBC with Differential/Platelet; Future   Labs pending Health Maintenance reviewed Diet and exercise encouraged  Follow up plan: 4 months      I discussed the assessment and treatment plan with the patient. The patient was provided an opportunity to ask questions and all were answered. The patient agreed with the plan and demonstrated an understanding of the instructions.   The patient was advised to call back or seek an in-person evaluation if the symptoms worsen or if the condition fails to improve as anticipated.  The above assessment and management plan was discussed with the patient. The patient verbalized understanding of and has agreed to the management plan. Patient is aware to call the clinic if symptoms persist or worsen. Patient is aware when to return to the clinic for a follow-up visit. Patient educated on when it is appropriate to go to the emergency department.   Time call ended:  8:27AM  I provided 17 minutes of non-face-to-face time during this encounter.    Evelina Dun, FNP

## 2019-06-01 ENCOUNTER — Other Ambulatory Visit: Payer: Self-pay

## 2019-06-01 ENCOUNTER — Other Ambulatory Visit: Payer: Medicare Other

## 2019-06-01 DIAGNOSIS — E785 Hyperlipidemia, unspecified: Secondary | ICD-10-CM

## 2019-06-01 DIAGNOSIS — E1159 Type 2 diabetes mellitus with other circulatory complications: Secondary | ICD-10-CM

## 2019-06-01 DIAGNOSIS — I152 Hypertension secondary to endocrine disorders: Secondary | ICD-10-CM

## 2019-06-01 DIAGNOSIS — M1711 Unilateral primary osteoarthritis, right knee: Secondary | ICD-10-CM

## 2019-06-01 DIAGNOSIS — M059 Rheumatoid arthritis with rheumatoid factor, unspecified: Secondary | ICD-10-CM

## 2019-06-01 DIAGNOSIS — Z1211 Encounter for screening for malignant neoplasm of colon: Secondary | ICD-10-CM

## 2019-06-01 DIAGNOSIS — E1165 Type 2 diabetes mellitus with hyperglycemia: Secondary | ICD-10-CM

## 2019-06-01 DIAGNOSIS — E1169 Type 2 diabetes mellitus with other specified complication: Secondary | ICD-10-CM

## 2019-06-01 LAB — BAYER DCA HB A1C WAIVED: HB A1C (BAYER DCA - WAIVED): 7.4 % — ABNORMAL HIGH (ref <7.0)

## 2019-06-02 ENCOUNTER — Other Ambulatory Visit: Payer: Self-pay | Admitting: Family

## 2019-06-02 LAB — CBC WITH DIFFERENTIAL/PLATELET
Basophils Absolute: 0.1 10*3/uL (ref 0.0–0.2)
Basos: 1 %
EOS (ABSOLUTE): 0.8 10*3/uL — ABNORMAL HIGH (ref 0.0–0.4)
Eos: 9 %
Hematocrit: 41.5 % (ref 37.5–51.0)
Hemoglobin: 14.8 g/dL (ref 13.0–17.7)
Immature Grans (Abs): 0 10*3/uL (ref 0.0–0.1)
Immature Granulocytes: 0 %
Lymphocytes Absolute: 2.6 10*3/uL (ref 0.7–3.1)
Lymphs: 29 %
MCH: 33 pg (ref 26.6–33.0)
MCHC: 35.7 g/dL (ref 31.5–35.7)
MCV: 92 fL (ref 79–97)
Monocytes Absolute: 0.5 10*3/uL (ref 0.1–0.9)
Monocytes: 6 %
Neutrophils Absolute: 4.9 10*3/uL (ref 1.4–7.0)
Neutrophils: 55 %
Platelets: 253 10*3/uL (ref 150–450)
RBC: 4.49 x10E6/uL (ref 4.14–5.80)
RDW: 12.4 % (ref 11.6–15.4)
WBC: 8.9 10*3/uL (ref 3.4–10.8)

## 2019-06-02 LAB — CMP14+EGFR
ALT: 25 IU/L (ref 0–44)
AST: 16 IU/L (ref 0–40)
Albumin/Globulin Ratio: 1.6 (ref 1.2–2.2)
Albumin: 4.1 g/dL (ref 3.8–4.8)
Alkaline Phosphatase: 67 IU/L (ref 39–117)
BUN/Creatinine Ratio: 14 (ref 10–24)
BUN: 17 mg/dL (ref 8–27)
Bilirubin Total: <0.2 mg/dL (ref 0.0–1.2)
CO2: 20 mmol/L (ref 20–29)
Calcium: 9.4 mg/dL (ref 8.6–10.2)
Chloride: 99 mmol/L (ref 96–106)
Creatinine, Ser: 1.19 mg/dL (ref 0.76–1.27)
GFR calc Af Amer: 74 mL/min/{1.73_m2} (ref >59)
GFR calc non Af Amer: 64 mL/min/{1.73_m2} (ref >59)
Globulin, Total: 2.5 g/dL (ref 1.5–4.5)
Glucose: 148 mg/dL — ABNORMAL HIGH (ref 65–99)
Potassium: 4.3 mmol/L (ref 3.5–5.2)
Sodium: 138 mmol/L (ref 134–144)
Total Protein: 6.6 g/dL (ref 6.0–8.5)

## 2019-08-10 ENCOUNTER — Ambulatory Visit (INDEPENDENT_AMBULATORY_CARE_PROVIDER_SITE_OTHER): Payer: Medicare Other | Admitting: Family Medicine

## 2019-08-10 DIAGNOSIS — K1379 Other lesions of oral mucosa: Secondary | ICD-10-CM

## 2019-08-10 MED ORDER — MAGIC MOUTHWASH W/LIDOCAINE: ORAL | 0 refills | Status: DC

## 2019-08-10 NOTE — Progress Notes (Signed)
Telephone visit  Subjective: CC: oral sores PCP: Sharion Balloon, FNP [phone] [name] is a 64 y.o. male calls for telephone consult today. Patient provides verbal consent for consult held via phone.  Due to COVID-19 pandemic this visit was conducted virtually. This visit type was conducted due to national recommendations for restrictions regarding the COVID-19 Pandemic (e.g. social distancing, sheltering in place) in an effort to limit this patient's exposure and mitigate transmission in our community. All issues noted in this document were discussed and addressed.  A physical exam was not performed with this format.   Location of patient: home Location of provider: [facility] Others present for call: wife  1. Oral soreness Patient reports rawness of the inside of bilateral cheeks and in between gums.  He reports burning oral pain.  He feels like his cheeks feel swollen on the inside.  Onset a couple of weeks ago.  Denies white/ yellow plaques in the mouth.  He has used colgate mouthwash/ listerine.  He is hydrating well.  Denies dry mouth recently.  Denies recent antibiotic/ steroid use.  He is treated with an immunomodulator for RA.   ROS: Per HPI  Allergies  Allergen Reactions  . Other Shortness Of Breath, Swelling and Hives  . Shellfish Allergy Shortness Of Breath and Swelling  . Leflunomide     LFT ELEVATION  . Methotrexate     LFT elevation  . Doxycycline Nausea And Vomiting  . Invokana [Canagliflozin] Other (See Comments)    Causes bladder infections  . Keflex [Cephalexin]     Pt does not tol-nervous   Past Medical History:  Diagnosis Date  . Anemia   . Arthritis    rheumatoid arthritis  . Diabetes mellitus   . Hyperlipidemia   . Hypertension   . Primary localized osteoarthritis of right knee 05/13/2015  . Scapho-lunate dissociation, right   . Vitamin D deficiency     Current Outpatient Medications:  .  abatacept in sodium chloride 0.9 %, Inject into the vein every  30 (thirty) days. Reported on 06/27/2015, Disp: , Rfl:  .  aspirin EC 81 MG tablet, Take 81 mg by mouth daily., Disp: , Rfl:  .  Dulaglutide 0.75 MG/0.5ML SOPN, Inject into the skin., Disp: , Rfl:  .  glimepiride (AMARYL) 2 MG tablet, Take 2 mg by mouth daily. , Disp: , Rfl:  .  hydrochlorothiazide (MICROZIDE) 12.5 MG capsule, TAKE (1) CAPSULE DAILY, Disp: 90 capsule, Rfl: 1 .  losartan (COZAAR) 100 MG tablet, TAKE 1 TABLET BY MOUTH  DAILY, Disp: 90 tablet, Rfl: 3 .  meloxicam (MOBIC) 15 MG tablet, Take 1 tablet (15 mg total) by mouth daily., Disp: 90 tablet, Rfl: 1 .  metFORMIN (GLUCOPHAGE) 1000 MG tablet, TAKE 1 TABLET IN THE MORNING AND 1&1/2 TABLETS IN THE EVENING, Disp: 75 tablet, Rfl: 3 .  nystatin (MYCOSTATIN/NYSTOP) powder, Apply 1 application topically 3 (three) times daily., Disp: 60 g, Rfl: 2 .  nystatin ointment (MYCOSTATIN), Apply 1 application topically 2 (two) times daily., Disp: 30 g, Rfl: 0 .  oxyCODONE-acetaminophen (PERCOCET) 10-325 MG tablet, Take 1-2 tablets by mouth every 6 (six) hours as needed for pain. MAXIMUM TOTAL ACETAMINOPHEN DOSE IS 4000 MG PER DAY (Patient not taking: Reported on 05/04/2019), Disp: 50 tablet, Rfl: 0 .  simvastatin (ZOCOR) 10 MG tablet, TAKE 1 TABLET BY MOUTH  DAILY, Disp: 90 tablet, Rfl: 3  Assessment/ Plan: 64 y.o. male   1. Oral pain of unknown etiology Empiric treatment for oral candidiasis  given symptomology.  His risk factors include DM2 and treatment with immunomodulator for RA.  Advised to be seen in office if symptoms do not resolve or worsen.  Continue fluids. - magic mouthwash w/lidocaine SOLN; Gargle and spit 37mL every 6 hours as needed for sore throat x7-10  Dispense: 480 mL; Refill: 0   Start time: 1:24pm End time: 1:30pm  Total time spent on patient care (including telephone call/ virtual visit): 12 minutes  [place], [place] [phone]

## 2019-08-25 ENCOUNTER — Other Ambulatory Visit: Payer: Self-pay

## 2019-08-25 ENCOUNTER — Inpatient Hospital Stay (HOSPITAL_COMMUNITY): Payer: Medicare Other | Attending: Hematology

## 2019-08-25 DIAGNOSIS — D509 Iron deficiency anemia, unspecified: Secondary | ICD-10-CM | POA: Insufficient documentation

## 2019-08-25 DIAGNOSIS — M069 Rheumatoid arthritis, unspecified: Secondary | ICD-10-CM | POA: Insufficient documentation

## 2019-08-25 DIAGNOSIS — E538 Deficiency of other specified B group vitamins: Secondary | ICD-10-CM | POA: Diagnosis not present

## 2019-08-25 DIAGNOSIS — Z87891 Personal history of nicotine dependence: Secondary | ICD-10-CM | POA: Insufficient documentation

## 2019-08-25 LAB — COMPREHENSIVE METABOLIC PANEL
ALT: 26 U/L (ref 0–44)
AST: 28 U/L (ref 15–41)
Albumin: 4.1 g/dL (ref 3.5–5.0)
Alkaline Phosphatase: 57 U/L (ref 38–126)
Anion gap: 15 (ref 5–15)
BUN: 20 mg/dL (ref 8–23)
CO2: 25 mmol/L (ref 22–32)
Calcium: 9.4 mg/dL (ref 8.9–10.3)
Chloride: 100 mmol/L (ref 98–111)
Creatinine, Ser: 1.17 mg/dL (ref 0.61–1.24)
GFR calc Af Amer: >60 mL/min (ref >60)
GFR calc non Af Amer: >60 mL/min (ref >60)
Glucose, Bld: 125 mg/dL — ABNORMAL HIGH (ref 70–99)
Potassium: 4.3 mmol/L (ref 3.5–5.1)
Sodium: 140 mmol/L (ref 135–145)
Total Bilirubin: 0.8 mg/dL (ref 0.3–1.2)
Total Protein: 7.1 g/dL (ref 6.5–8.1)

## 2019-08-25 LAB — FERRITIN: Ferritin: 16 ng/mL — ABNORMAL LOW (ref 24–336)

## 2019-08-25 LAB — CBC WITH DIFFERENTIAL/PLATELET
Abs Immature Granulocytes: 0.02 10*3/uL (ref 0.00–0.07)
Basophils Absolute: 0.1 10*3/uL (ref 0.0–0.1)
Basophils Relative: 1 %
Eosinophils Absolute: 0.6 10*3/uL — ABNORMAL HIGH (ref 0.0–0.5)
Eosinophils Relative: 8 %
HCT: 42.1 % (ref 39.0–52.0)
Hemoglobin: 13.9 g/dL (ref 13.0–17.0)
Immature Granulocytes: 0 %
Lymphocytes Relative: 33 %
Lymphs Abs: 2.6 10*3/uL (ref 0.7–4.0)
MCH: 30.5 pg (ref 26.0–34.0)
MCHC: 33 g/dL (ref 30.0–36.0)
MCV: 92.3 fL (ref 80.0–100.0)
Monocytes Absolute: 0.5 10*3/uL (ref 0.1–1.0)
Monocytes Relative: 6 %
Neutro Abs: 4 10*3/uL (ref 1.7–7.7)
Neutrophils Relative %: 52 %
Platelets: 265 10*3/uL (ref 150–400)
RBC: 4.56 MIL/uL (ref 4.22–5.81)
RDW: 12 % (ref 11.5–15.5)
WBC: 7.8 10*3/uL (ref 4.0–10.5)
nRBC: 0 % (ref 0.0–0.2)

## 2019-08-25 LAB — IRON AND TIBC
Iron: 155 ug/dL (ref 45–182)
Saturation Ratios: 35 % (ref 17.9–39.5)
TIBC: 439 ug/dL (ref 250–450)
UIBC: 284 ug/dL

## 2019-08-25 LAB — FOLATE: Folate: 7.5 ng/mL (ref >5.9)

## 2019-08-25 LAB — VITAMIN B12: Vitamin B-12: 184 pg/mL (ref 180–914)

## 2019-08-27 NOTE — Progress Notes (Signed)
[place], [place] [address]   CLINIC:  Medical Oncology/Hematology  PCP:  Sharion Balloon, [place] / [place] [address]  [phone]  REASON FOR VISIT:  Follow-up for iron deficiency anemia  CURRENT THERAPY: Intermittent Feraheme infusions  INTERVAL HISTORY:  [name] 64 y.o. male returns for routine follow-up for his iron deficiency anemia. [name] was last seen on 05/04/2019.  He notes that he continues to have joint pain despite taking his medication as directed. He says the joints in his fingers and his right shoulder hurts the most. He does note that he needs to have surgery on his right shoulder as it "is bone on bone." He denies taking vitamin B12. He notes that he used to eat a lot of ice.   He has talked to Dr. Levell July about a colonoscopy, but he states that he is trying to wait until COVID-19 has gotten a little bit better.   REVIEW OF SYSTEMS:  Review of Systems  Constitutional: Positive for fatigue (mild). Negative for appetite change, chills and fever.  HENT:   Negative for lump/mass, mouth sores, sore throat and trouble swallowing.   Eyes: Negative for eye problems.  Respiratory: Negative for chest tightness, cough, shortness of breath and wheezing.   Cardiovascular: Negative for chest pain and palpitations.  Gastrointestinal: Negative for abdominal pain, constipation, diarrhea, nausea and vomiting.  Genitourinary: Negative for bladder incontinence, dysuria, frequency and hematuria.   Musculoskeletal: Negative for arthralgias, back pain, flank pain and myalgias.       Joint pain in fingers and Rt shoulder  Skin: Negative for rash.  Neurological: Negative for dizziness, headaches, light-headedness and numbness.  Hematological: Does not bruise/bleed easily.  Psychiatric/Behavioral: Negative for depression. The patient is not nervous/anxious.     PAST MEDICAL/SURGICAL HISTORY:  Past Medical  History:  Diagnosis Date  . Anemia   . Arthritis    rheumatoid arthritis  . Diabetes mellitus   . Hyperlipidemia   . Hypertension   . Primary localized osteoarthritis of right knee 05/13/2015  . Scapho-lunate dissociation, right   . Vitamin D deficiency    Past Surgical History:  Procedure Laterality Date  . CARDIAC CATHETERIZATION  2005   normal  . CARPAL TUNNEL RELEASE  2009   both hands  . HARDWARE REMOVAL Right 11/14/2017   Procedure: REMOVAL ACCUTRAC SCREW RIGHT WRIST;  Surgeon: Daryll Brod, MD;  Location: [facility];  Service: Orthopedics;  Laterality: Right;  . KNEE ARTHROSCOPY     rt  . PARTIAL KNEE ARTHROPLASTY Right 05/13/2015   Procedure: RIGHT UNI KNEE ARTHROPLASTY;  Surgeon: Marchia Bond, MD;  Location: [facility];  Service: Orthopedics;  Laterality: Right;  . scaphoid excision Left 2012   Dr Daylene Katayama  . TONSILLECTOMY    . WRIST FUSION Right 03/14/2017   Procedure: SCAPHOID EXCISION ULNAR FOUR BONE FUSION RIGHT;  Surgeon: Daryll Brod, MD;  Location: [facility];  Service: Orthopedics;  Laterality: Right;    SOCIAL HISTORY:  Social History   Socioeconomic History  . Marital status: Married    Spouse name: Not on file  . Number of children: 2  . Years of education: Not on file  . Highest education level: Not on file  Occupational History  . Occupation: Disabled  Tobacco Use  . Smoking status: Former Smoker    Quit date: 03/05/1991    Years since quitting: 28.5  . Smokeless tobacco: Never  Used  Substance and Sexual Activity  . Alcohol use: Yes    Comment: rare  . Drug use: No  . Sexual activity: Not on file  Other Topics Concern  . Not on file  Social History Narrative  . Not on file   Social Determinants of Health   Financial Resource Strain:   . Difficulty of Paying Living Expenses:   Food Insecurity:   . Worried About Charity fundraiser in the Last Year:   . Arboriculturist in the Last Year:     Transportation Needs:   . Film/video editor (Medical):   [name] Lack of Transportation (Non-Medical):   Physical Activity:   . Days of Exercise per Week:   . Minutes of Exercise per Session:   Stress:   . Feeling of Stress :   Social Connections:   . Frequency of Communication with Friends and Family:   . Frequency of Social Gatherings with Friends and Family:   . Attends Religious Services:   . Active Member of Clubs or Organizations:   . Attends Archivist Meetings:   [name] Marital Status:   Intimate Partner Violence:   . Fear of Current or Ex-Partner:   . Emotionally Abused:   [name] Physically Abused:   . Sexually Abused:     FAMILY HISTORY:  Family History  Problem Relation Age of Onset  . Diabetes Father   . Hypertension Father   . Heart Problems Father   . Stroke Father   . Diabetes Maternal Grandmother   . Arthritis Sister   . Arthritis Sister   . Arthritis Daughter     CURRENT MEDICATIONS:  Current Outpatient Medications  Medication Sig Dispense Refill  . abatacept in sodium chloride 0.9 % Inject into the vein every 30 (thirty) days. Reported on 06/27/2015    . aspirin EC 81 MG tablet Take 81 mg by mouth daily.    [name] glimepiride (AMARYL) 2 MG tablet Take 2 mg by mouth daily.     . hydrochlorothiazide (MICROZIDE) 12.5 MG capsule TAKE (1) CAPSULE DAILY 90 capsule 1  . losartan (COZAAR) 100 MG tablet TAKE 1 TABLET BY MOUTH  DAILY 90 tablet 3  . magic mouthwash w/lidocaine SOLN Gargle and spit 33mL every 6 hours as needed for sore throat x7-10 480 mL 0  . meloxicam (MOBIC) 15 MG tablet Take 1 tablet (15 mg total) by mouth daily. 90 tablet 1  . metFORMIN (GLUCOPHAGE) 1000 MG tablet TAKE 1 TABLET IN THE MORNING AND 1&1/2 TABLETS IN THE EVENING 75 tablet 3  . oxyCODONE-acetaminophen (PERCOCET) 10-325 MG tablet Take 1-2 tablets by mouth every 6 (six) hours as needed for pain. MAXIMUM TOTAL ACETAMINOPHEN DOSE IS 4000 MG PER DAY 50 tablet 0  . simvastatin (ZOCOR) 10 MG  tablet TAKE 1 TABLET BY MOUTH  DAILY 90 tablet 3   No current facility-administered medications for this visit.    ALLERGIES:  Allergies  Allergen Reactions  . Other Shortness Of Breath, Swelling and Hives  . Shellfish Allergy Shortness Of Breath and Swelling  . Leflunomide     LFT ELEVATION  . Methotrexate     LFT elevation  . Canagliflozin Other (See Comments)    Causes bladder infections Causes bladder infections Causes bladder infections  . Cephalexin Other (See Comments)    Pt does not tol-nervous Pt does not tol-nervous Pt does not tol-nervous  . Doxycycline Nausea And Vomiting  . Dulaglutide Diarrhea and Nausea Only  PHYSICAL EXAM:  Performance status (ECOG): 0 - Asymptomatic  Vitals:   09/01/19 0808  BP: (!) 154/118  Pulse: 69  Resp: 18  Temp: (!) 96.8 F (36 C)  SpO2: 98%   Wt Readings from Last 3 Encounters:  09/01/19 246 lb 1.6 oz (111.6 kg)  05/04/19 250 lb 11.2 oz (113.7 kg)  03/27/19 249 lb (112.9 kg)   Physical Exam Constitutional:      Appearance: Normal appearance.  HENT:     Nose: No congestion.     Mouth/Throat:     Mouth: Mucous membranes are moist.  Eyes:     Extraocular Movements: Extraocular movements intact.     Pupils: Pupils are equal, round, and reactive to light.  Cardiovascular:     Rate and Rhythm: Normal rate and regular rhythm.     Heart sounds: No murmur. No gallop.   Pulmonary:     Breath sounds: No wheezing, rhonchi or rales.  Abdominal:     Tenderness: There is no abdominal tenderness.  Musculoskeletal:        General: No tenderness.     Cervical back: Normal range of motion. No tenderness.     Right lower leg: No edema.     Left lower leg: No edema.  Skin:    General: Skin is warm and dry.     Findings: No bruising, erythema or rash.  Neurological:     Mental Status: He is alert and oriented to person, place, and time.     Sensory: No sensory deficit.     Motor: No weakness.  Psychiatric:        Mood and  Affect: Mood normal.        Behavior: Behavior normal.        Thought Content: Thought content normal.        Judgment: Judgment normal.     LABORATORY DATA:  I have reviewed the labs as listed.  CBC Latest Ref Rng & Units 08/25/2019 06/01/2019 04/28/2019  WBC 4.0 - 10.5 K/uL 7.8 8.9 8.1  Hemoglobin 13.0 - 17.0 g/dL 13.9 14.8 15.4  Hematocrit 39.0 - 52.0 % 42.1 41.5 46.0  Platelets 150 - 400 K/uL 265 253 277   CMP Latest Ref Rng & Units 08/25/2019 06/01/2019 04/28/2019  Glucose 70 - 99 mg/dL 125(H) 148(H) 152(H)  BUN 8 - 23 mg/dL 20 17 15   Creatinine 0.61 - 1.24 mg/dL 1.17 1.19 1.11  Sodium 135 - 145 mmol/L 140 138 137  Potassium 3.5 - 5.1 mmol/L 4.3 4.3 4.0  Chloride 98 - 111 mmol/L 100 99 102  CO2 22 - 32 mmol/L 25 20 25   Calcium 8.9 - 10.3 mg/dL 9.4 9.4 9.2  Total Protein 6.5 - 8.1 g/dL 7.1 6.6 7.6  Total Bilirubin 0.3 - 1.2 mg/dL 0.8 <0.2 0.7  Alkaline Phos 38 - 126 U/L 57 67 57  AST 15 - 41 U/L 28 16 28   ALT 0 - 44 U/L 26 25 30       Component Value Date/Time   RBC 4.56 08/25/2019 1036   MCV 92.3 08/25/2019 1036   MCV 92 06/01/2019 0954   MCH 30.5 08/25/2019 1036   MCHC 33.0 08/25/2019 1036   RDW 12.0 08/25/2019 1036   RDW 12.4 06/01/2019 0954   LYMPHSABS 2.6 08/25/2019 1036   LYMPHSABS 2.6 06/01/2019 0954   MONOABS 0.5 08/25/2019 1036   EOSABS 0.6 (H) 08/25/2019 1036   EOSABS 0.8 (H) 06/01/2019 0954   BASOSABS 0.1 08/25/2019 1036   BASOSABS  0.1 06/01/2019 0954   Lab Results  Component Value Date   TIBC 439 08/25/2019   TIBC 366 01/28/2019   TIBC 451 (H) 12/05/2018   TIBC 427 05/29/2016   FERRITIN 16 (L) 08/25/2019   FERRITIN 101 01/28/2019   FERRITIN 7 (L) 12/05/2018   FERRITIN 9 (L) 05/29/2016   IRONPCTSAT 35 08/25/2019   IRONPCTSAT 24 01/28/2019   IRONPCTSAT 6 (LL) 12/05/2018   IRONPCTSAT 10 (L) 05/29/2016    DIAGNOSTIC IMAGING:  I have independently reviewed the scans and discussed with the patient.    ASSESSMENT & PLAN:  Microcytic anemia 1.   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 -He does not report any bleeding per rectum or melena.  Energy levels are good. -We reviewed labs from 08/25/2019.  Hemoglobin is 13.9.  Ferritin has decreased to 16 from 101.  Vitamin B12 was 184. -He was recommended to start taking iron tablet daily with stool softener.  We will plan to repeat his labs in 3 months. -He wants to put off his screening colonoscopy until after COVID-19 is cleared completely.  2.  B12 deficiency: -His B12 level is borderline at 184.  I have recommended him to start taking B12 1 mg tablet daily.  3.  Rheumatoid arthritis: -His arthritis involves small joints of the hands and right shoulder.  He is on Orencia every 4 weeks.  4.  Tobacco abuse: -He quit smoking in 1992.  Smoked 1 and 1/2 pack/day for 20 years.   Orders placed this encounter:  No orders of the defined types were placed in this encounter.        Derek Jack, MD, 09/01/19 8:25 AM  [place] [phone]   I, Jacqualyn Posey, am acting as a scribe for Dr. Sanda Linger.  I, Derek Jack MD, have reviewed the above documentation for accuracy and completeness, and I agree with the above.

## 2019-09-01 ENCOUNTER — Other Ambulatory Visit: Payer: Self-pay

## 2019-09-01 ENCOUNTER — Inpatient Hospital Stay (HOSPITAL_BASED_OUTPATIENT_CLINIC_OR_DEPARTMENT_OTHER): Payer: Medicare Other | Admitting: Hematology

## 2019-09-01 ENCOUNTER — Encounter (HOSPITAL_COMMUNITY): Payer: Self-pay | Admitting: Hematology

## 2019-09-01 DIAGNOSIS — D509 Iron deficiency anemia, unspecified: Secondary | ICD-10-CM | POA: Diagnosis not present

## 2019-09-01 NOTE — Assessment & Plan Note (Addendum)
1.  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 -He does not report any bleeding per rectum or melena.  Energy levels are good. -We reviewed labs from 08/25/2019.  Hemoglobin is 13.9.  Ferritin has decreased to 16 from 101.  Vitamin B12 was 184. -He was recommended to start taking iron tablet daily with stool softener.  We will plan to repeat his labs in 3 months. -He wants to put off his screening colonoscopy until after COVID-19 is cleared completely.  2.  B12 deficiency: -His B12 level is borderline at 184.  I have recommended him to start taking B12 1 mg tablet daily.  3.  Rheumatoid arthritis: -His arthritis involves small joints of the hands and right shoulder.  He is on Orencia every 4 weeks.  4.  Tobacco abuse: -He quit smoking in 1992.  Smoked 1 and 1/2 pack/day for 20 years.

## 2019-09-01 NOTE — Patient Instructions (Signed)
[place] [facility] Discharge Instructions  You were seen today by Dr. Delton Coombes. He went over your recent results. He will see you back in for labs and follow up. Please start taking one iron and one vitamin B12 vitamin 1mg  tablet daily.    Thank you for choosing [place] at [facility] to provide your oncology and hematology care.  To afford each patient quality time with our provider, please arrive at least 15 minutes before your scheduled appointment time.   If you have a lab appointment with the [facility] please come in thru the  Main Entrance and check in at the main information desk  You need to re-schedule your appointment should you arrive 10 or more minutes late.  We strive to give you quality time with our providers, and arriving late affects you and other patients whose appointments are after yours.  Also, if you no show three or more times for appointments you may be dismissed from the clinic at the providers discretion.     Again, thank you for choosing [facility].  Our hope is that these requests will decrease the amount of time that you wait before being seen by our physicians.       _____________________________________________________________  Should you have questions after your visit to [facility], please contact our office at [phone] between the hours of 8:00 a.m. and 4:30 p.m.  Voicemails left after 4:00 p.m. will not be returned until the following business day.  For prescription refill requests, have your pharmacy contact our office and allow 72 hours.    Cancer Center Support Programs:   > Cancer Support Group  2nd Tuesday of the month 1pm-2pm, Journey Room

## 2019-09-10 NOTE — Progress Notes (Signed)
° ° °  Patient ID: [name], male   DOB: December 04, 1955, 64 y.o.   MRN: [id number]    65 y.o. with CRF;s central obesity, DM and HTN  Quit smoking in 1998 has significant arthritis That limits his activity Had normal myovue 03/28/16 for atypical chest pain   Had right wrist surgery with [name] 03/14/17 with no cardiac issues.   Discussed weight loss and exercise with him  Currently on Cozaar and diuretic for BP Sees Balin for DM Last A1c 7.6   Discussed diet and carbs he has room for improvement Has seen eye doctor yearly  Discussed utility of coronary calcium score to further risk stratify and guide Rx of LDL and A1c    ROS: Denies fever, malais, weight loss, blurry vision, decreased visual acuity, cough, sputum, SOB, hemoptysis, pleuritic pain, palpitaitons, heartburn, abdominal pain, melena, lower extremity edema, claudication, or rash.  All other systems reviewed and negative  General: Affect appropriate Obese HEENT: normal Neck supple with no adenopathy JVP normal no bruits no thyromegaly Lungs clear with no wheezing and good diaphragmatic motion Heart:  S1/S2 no murmur, no rub, gallop or click PMI normal Abdomen: benighn, BS positve, no tenderness, no AAA no bruit.  No HSM or HJR Distal pulses intact with no bruits No edema Neuro non-focal Skin warm and dry Right wrist less mobility post surgery     Current Outpatient Medications  Medication Sig Dispense Refill   abatacept in sodium chloride 0.9 % Inject into the vein every 30 (thirty) days. Reported on 06/27/2015     aspirin EC 81 MG tablet Take 81 mg by mouth daily.     glimepiride (AMARYL) 2 MG tablet Take 2 mg by mouth daily.      hydrochlorothiazide (MICROZIDE) 12.5 MG capsule TAKE (1) CAPSULE DAILY 90 capsule 1   losartan (COZAAR) 100 MG tablet TAKE 1 TABLET BY MOUTH  DAILY 90 tablet 3   magic mouthwash w/lidocaine SOLN Gargle and spit 63mL every 6 hours as needed for sore throat x7-10 480 mL 0   meloxicam  (MOBIC) 15 MG tablet Take 1 tablet (15 mg total) by mouth daily. 90 tablet 1   metFORMIN (GLUCOPHAGE) 1000 MG tablet TAKE 1 TABLET IN THE MORNING AND 1&1/2 TABLETS IN THE EVENING 75 tablet 3   oxyCODONE-acetaminophen (PERCOCET) 10-325 MG tablet Take 1-2 tablets by mouth every 6 (six) hours as needed for pain. MAXIMUM TOTAL ACETAMINOPHEN DOSE IS 4000 MG PER DAY 50 tablet 0   simvastatin (ZOCOR) 10 MG tablet TAKE 1 TABLET BY MOUTH  DAILY 90 tablet 3   No current facility-administered medications for this visit.    Allergies  Other, Shellfish allergy, Leflunomide, Methotrexate, Canagliflozin, Cephalexin, Doxycycline, and Dulaglutide  Electrocardiogram:  05/09/18 SR rate 69 normal  09/14/19 SR rate 70 LAD normal   Assessment and Plan HTN: continue ARB and diuretic stable  [id number] low carb diet.  Target hemoglobin A1c is 6.5 or less.  Continue current medications. Arthritis:continue monthly abatacept with rheum  Cholesterol is at goal.  Continue current dose of statin and diet Rx.  No myalgias or side effects.  F/U  LFT's in 6 months. Lab Results  Component Value Date   LDLCALC 72 08/12/2018   Chest Pain :  Atypical abnormal ECG normal myovue 2017 no chest pain Coronary calcium score ordered          F/u with me in a year  Coronary calcium score    Jenkins Rouge

## 2019-09-14 ENCOUNTER — Ambulatory Visit (INDEPENDENT_AMBULATORY_CARE_PROVIDER_SITE_OTHER): Payer: Medicare Other | Admitting: Cardiovascular Disease

## 2019-09-14 ENCOUNTER — Other Ambulatory Visit: Payer: Self-pay

## 2019-09-14 ENCOUNTER — Encounter: Payer: Self-pay | Admitting: Cardiovascular Disease

## 2019-09-14 VITALS — BP 122/74 | HR 70 | Ht 70.0 in | Wt 241.0 lb

## 2019-09-14 DIAGNOSIS — R079 Chest pain, unspecified: Secondary | ICD-10-CM | POA: Diagnosis not present

## 2019-09-14 DIAGNOSIS — I10 Essential (primary) hypertension: Secondary | ICD-10-CM | POA: Diagnosis not present

## 2019-09-14 NOTE — Patient Instructions (Addendum)
Medication Instructions:  *If you need a refill on your cardiac medications before your next appointment, please call your pharmacy*  Lab Work: If you have labs (blood work) drawn today and your tests are completely normal, you will receive your results only by: . MyChart Message (if you have MyChart) OR . A paper copy in the mail If you have any lab test that is abnormal or we need to change your treatment, we will call you to review the results.  Testing/Procedures: Cardiac CT scanning for calcium score, (CAT scanning), is a noninvasive, special x-ray that produces cross-sectional images of the body using x-rays and a computer. CT scans help physicians diagnose and treat medical conditions. For some CT exams, a contrast material is used to enhance visibility in the area of the body being studied. CT scans provide greater clarity and reveal more details than regular x-ray exams.  Follow-Up: At [facility], you and your health needs are our priority.  As part of our continuing mission to provide you with exceptional heart care, we have created designated Provider Care Teams.  These Care Teams include your primary Cardiologist (physician) and Advanced Practice Providers (APPs -  Physician Assistants and Nurse Practitioners) who all work together to provide you with the care you need, when you need it.  We recommend signing up for the patient portal called "MyChart".  Sign up information is provided on this After Visit Summary.  MyChart is used to connect with patients for Virtual Visits (Telemedicine).  Patients are able to view lab/test results, encounter notes, upcoming appointments, etc.  Non-urgent messages can be sent to your provider as well.   To learn more about what you can do with MyChart, go to [email or web address].    Your next appointment:   12 month(s)  The format for your next appointment:   In Person  Provider:   You may see Peter Nishan, MD or one of the following  Advanced Practice Providers on your designated Care Team:    Lori Gerhardt, NP  Laura Ingold, NP  Jill McDaniel, NP    

## 2019-09-24 ENCOUNTER — Other Ambulatory Visit: Payer: Self-pay

## 2019-09-24 ENCOUNTER — Ambulatory Visit (INDEPENDENT_AMBULATORY_CARE_PROVIDER_SITE_OTHER)
Admission: RE | Admit: 2019-09-24 | Discharge: 2019-09-24 | Disposition: A | Discharge status: Home / Self Care | Payer: Self-pay | Source: Ambulatory Visit | Attending: Cardiovascular Disease | Admitting: Cardiovascular Disease

## 2019-09-24 DIAGNOSIS — I10 Essential (primary) hypertension: Secondary | ICD-10-CM

## 2019-09-24 DIAGNOSIS — R079 Chest pain, unspecified: Secondary | ICD-10-CM

## 2019-10-05 ENCOUNTER — Other Ambulatory Visit: Payer: Self-pay | Admitting: Family

## 2019-10-05 DIAGNOSIS — E1165 Type 2 diabetes mellitus with hyperglycemia: Secondary | ICD-10-CM

## 2019-11-02 ENCOUNTER — Telehealth: Payer: Self-pay | Admitting: Cardiovascular Disease

## 2019-11-02 MED ORDER — SIMVASTATIN 10 MG PO TABS: 10.0000 mg | ORAL_TABLET | Freq: Every day | ORAL | 3 refills | Status: DC

## 2019-11-02 MED ORDER — LOSARTAN POTASSIUM 100 MG PO TABS: 100.0000 mg | ORAL_TABLET | Freq: Every day | ORAL | 3 refills | Status: DC

## 2019-11-02 NOTE — Telephone Encounter (Signed)
° °*  STAT* If patient is at the pharmacy, call can be transferred to refill team.   1. Which medications need to be refilled? (please list name of each medication and dose if known)   simvastatin (ZOCOR) 10 MG tablet losartan (COZAAR) 100 MG tablet  2. Which pharmacy/location (including street and city if local pharmacy) is medication to be sent to?  [place], [place]  3. Do they need a 30 day or 90 day supply? 90  Patient is switching pharmacies from mail order to [facility] and needs new prescriptions

## 2019-11-02 NOTE — Telephone Encounter (Signed)
Pt's medications were sent to pt's pharmacy as requested. Confirmation received.  

## 2019-11-03 LAB — HM DIABETES EYE EXAM

## 2019-11-04 ENCOUNTER — Other Ambulatory Visit: Payer: Self-pay | Admitting: Family

## 2019-11-04 DIAGNOSIS — E1165 Type 2 diabetes mellitus with hyperglycemia: Secondary | ICD-10-CM

## 2019-11-26 ENCOUNTER — Other Ambulatory Visit: Payer: Self-pay

## 2019-11-26 ENCOUNTER — Ambulatory Visit (INDEPENDENT_AMBULATORY_CARE_PROVIDER_SITE_OTHER): Payer: Medicare Other | Admitting: Family

## 2019-11-26 ENCOUNTER — Encounter: Payer: Self-pay | Admitting: Family

## 2019-11-26 VITALS — BP 140/87 | HR 73 | Temp 97.2°F | Ht 70.0 in | Wt 243.6 lb

## 2019-11-26 DIAGNOSIS — E1165 Type 2 diabetes mellitus with hyperglycemia: Secondary | ICD-10-CM

## 2019-11-26 DIAGNOSIS — L409 Psoriasis, unspecified: Secondary | ICD-10-CM

## 2019-11-26 DIAGNOSIS — E785 Hyperlipidemia, unspecified: Secondary | ICD-10-CM

## 2019-11-26 DIAGNOSIS — I10 Essential (primary) hypertension: Secondary | ICD-10-CM

## 2019-11-26 DIAGNOSIS — Z96651 Presence of right artificial knee joint: Secondary | ICD-10-CM

## 2019-11-26 DIAGNOSIS — M059 Rheumatoid arthritis with rheumatoid factor, unspecified: Secondary | ICD-10-CM

## 2019-11-26 DIAGNOSIS — D509 Iron deficiency anemia, unspecified: Secondary | ICD-10-CM

## 2019-11-26 DIAGNOSIS — E1159 Type 2 diabetes mellitus with other circulatory complications: Secondary | ICD-10-CM

## 2019-11-26 DIAGNOSIS — E1169 Type 2 diabetes mellitus with other specified complication: Secondary | ICD-10-CM | POA: Diagnosis not present

## 2019-11-26 DIAGNOSIS — I152 Hypertension secondary to endocrine disorders: Secondary | ICD-10-CM

## 2019-11-26 LAB — LIPID PANEL
Chol/HDL Ratio: 3.2 ratio (ref 0.0–5.0)
Cholesterol, Total: 108 mg/dL (ref 100–199)
HDL: 34 mg/dL — ABNORMAL LOW (ref >39)
LDL Chol Calc (NIH): 62 mg/dL (ref 0–99)
Triglycerides: 53 mg/dL (ref 0–149)
VLDL Cholesterol Cal: 12 mg/dL (ref 5–40)

## 2019-11-26 LAB — CBC WITH DIFFERENTIAL/PLATELET
Basophils Absolute: 0.1 10*3/uL (ref 0.0–0.2)
Basos: 1 %
EOS (ABSOLUTE): 0.7 10*3/uL — ABNORMAL HIGH (ref 0.0–0.4)
Eos: 7 %
Hematocrit: 44 % (ref 37.5–51.0)
Hemoglobin: 14.9 g/dL (ref 13.0–17.7)
Immature Grans (Abs): 0 10*3/uL (ref 0.0–0.1)
Immature Granulocytes: 0 %
Lymphocytes Absolute: 3.4 10*3/uL — ABNORMAL HIGH (ref 0.7–3.1)
Lymphs: 34 %
MCH: 30.8 pg (ref 26.6–33.0)
MCHC: 33.9 g/dL (ref 31.5–35.7)
MCV: 91 fL (ref 79–97)
Monocytes Absolute: 0.7 10*3/uL (ref 0.1–0.9)
Monocytes: 6 %
Neutrophils Absolute: 5.1 10*3/uL (ref 1.4–7.0)
Neutrophils: 52 %
Platelets: 278 10*3/uL (ref 150–450)
RBC: 4.84 x10E6/uL (ref 4.14–5.80)
RDW: 12.9 % (ref 11.6–15.4)
WBC: 10.1 10*3/uL (ref 3.4–10.8)

## 2019-11-26 LAB — CMP14+EGFR
ALT: 28 IU/L (ref 0–44)
AST: 27 IU/L (ref 0–40)
Albumin/Globulin Ratio: 2.1 (ref 1.2–2.2)
Albumin: 4.6 g/dL (ref 3.8–4.8)
Alkaline Phosphatase: 71 IU/L (ref 48–121)
BUN/Creatinine Ratio: 12 (ref 10–24)
BUN: 14 mg/dL (ref 8–27)
Bilirubin Total: 0.2 mg/dL (ref 0.0–1.2)
CO2: 23 mmol/L (ref 20–29)
Calcium: 9.5 mg/dL (ref 8.6–10.2)
Chloride: 101 mmol/L (ref 96–106)
Creatinine, Ser: 1.18 mg/dL (ref 0.76–1.27)
GFR calc Af Amer: 74 mL/min/{1.73_m2} (ref >59)
GFR calc non Af Amer: 64 mL/min/{1.73_m2} (ref >59)
Globulin, Total: 2.2 g/dL (ref 1.5–4.5)
Glucose: 153 mg/dL — ABNORMAL HIGH (ref 65–99)
Potassium: 4.5 mmol/L (ref 3.5–5.2)
Sodium: 138 mmol/L (ref 134–144)
Total Protein: 6.8 g/dL (ref 6.0–8.5)

## 2019-11-26 LAB — BAYER DCA HB A1C WAIVED: HB A1C (BAYER DCA - WAIVED): 7.6 % — ABNORMAL HIGH (ref <7.0)

## 2019-11-26 NOTE — Progress Notes (Signed)
Subjective:    Patient ID: [name], male    DOB: 15-Jul-1955, 64 y.o.   MRN: [id number]  Chief Complaint  Patient presents with  . Medical Management of Chronic Issues    fasting   . Diabetes   PT calls to the office today forchronic follow up. Pt has rheumatoid arthritis and followed by rheumatologists every 6 months.Pt is followed by Cardiologists annually. He is followed by Hematologists for iron deficiency.   Diabetes He presents for his follow-up diabetic visit. He has type 2 diabetes mellitus. His disease course has been stable. There are no hypoglycemic associated symptoms. Associated symptoms include blurred vision and foot paresthesias. Symptoms are stable. Diabetic complications include peripheral neuropathy. Pertinent negatives for diabetic complications include no CVA, heart disease or nephropathy. Risk factors for coronary artery disease include dyslipidemia, diabetes mellitus, male sex, hypertension and sedentary lifestyle. He is following a generally unhealthy diet. His overall blood glucose range is 140-180 mg/dl. An ACE inhibitor/angiotensin II receptor blocker is being taken. Eye exam is current.  Hypertension This is a chronic problem. The current episode started more than 1 year ago. The problem has been waxing and waning since onset. The problem is controlled. Associated symptoms include blurred vision. Pertinent negatives include no malaise/fatigue, peripheral edema or shortness of breath. Risk factors for coronary artery disease include dyslipidemia, diabetes mellitus, male gender and obesity. The current treatment provides moderate improvement. There is no history of CVA.  Hyperlipidemia This is a chronic problem. The current episode started more than 1 year ago. The problem is controlled. Recent lipid tests were reviewed and are normal. Exacerbating diseases include obesity. Pertinent negatives include no shortness of breath. Current antihyperlipidemic treatment  includes statins. The current treatment provides moderate improvement of lipids. Risk factors for coronary artery disease include diabetes mellitus, dyslipidemia, male sex, hypertension and a sedentary lifestyle.  Arthritis Presents for follow-up visit. He complains of pain and stiffness. The symptoms have been stable. Affected locations include the left knee, right knee, right MCP, left MCP, right shoulder and left shoulder (shoulder). His pain is at a severity of 6/10.  Anemia Presents for follow-up visit. There has been no malaise/fatigue.      Review of Systems  Constitutional: Negative for malaise/fatigue.  Eyes: Positive for blurred vision.  Respiratory: Negative for shortness of breath.   Musculoskeletal: Positive for arthritis and stiffness.  All other systems reviewed and are negative.      Objective:   Physical Exam Vitals reviewed.  Constitutional:      General: He is not in acute distress.    Appearance: He is well-developed. He is obese.  HENT:     Head: Normocephalic.     Right Ear: Tympanic membrane normal.     Left Ear: Tympanic membrane normal.  Eyes:     General:        Right eye: No discharge.        Left eye: No discharge.     Pupils: Pupils are equal, round, and reactive to light.  Neck:     Thyroid: No thyromegaly.  Cardiovascular:     Rate and Rhythm: Normal rate and regular rhythm.     Heart sounds: Normal heart sounds. No murmur heard.   Pulmonary:     Effort: Pulmonary effort is normal. No respiratory distress.     Breath sounds: Normal breath sounds. No wheezing.  Abdominal:     General: Bowel sounds are normal. There is no distension.  Palpations: Abdomen is soft.     Tenderness: There is no abdominal tenderness.  Musculoskeletal:        General: No tenderness. Normal range of motion.     Cervical back: Normal range of motion and neck supple.  Skin:    General: Skin is warm and dry.     Findings: No erythema or rash.  Neurological:      Mental Status: He is alert and oriented to person, place, and time.     Cranial Nerves: No cranial nerve deficit.     Deep Tendon Reflexes: Reflexes are normal and symmetric.  Psychiatric:        Behavior: Behavior normal.        Thought Content: Thought content normal.        Judgment: Judgment normal.      Diabetic Foot Exam - Simple   Simple Foot Form Diabetic Foot exam was performed with the following findings: Yes 11/26/2019  9:10 AM  Visual Inspection No deformities, no ulcerations, no other skin breakdown bilaterally: Yes Sensation Testing Intact to touch and monofilament testing bilaterally: Yes Pulse Check Posterior Tibialis and Dorsalis pulse intact bilaterally: Yes Comments      BP 140/87   Pulse 73   Temp (!) 97.2 F (36.2 C) (Temporal)   Ht _0  (1.778 m)   Wt 243 lb 9.6 oz (110.5 kg)   SpO2 93%   BMI 34.95 kg/m      Assessment & Plan:  [name] comes in today with chief complaint of Medical Management of Chronic Issues (fasting ) and Diabetes   Diagnosis and orders addressed:  1. Type 2 diabetes mellitus with hyperglycemia, without long-term current use of insulin (HCC)  - Bayer DCA Hb A1c Waived - Microalbumin / creatinine urine ratio - CMP14+EGFR - CBC with Differential/Platelet  2. Hypertension associated with diabetes ([place]) - CMP14+EGFR - CBC with Differential/Platelet  3. Hyperlipidemia associated with type 2 diabetes mellitus ([place]) - CMP14+EGFR - CBC with Differential/Platelet - Lipid panel  4. Rheumatoid arthritis with positive rheumatoid factor, involving unspecified site ([place]) - CMP14+EGFR - CBC with Differential/Platelet  5. Psoriasis - CMP14+EGFR - CBC with Differential/Platelet  6. Morbid obesity ([place]) - CMP14+EGFR - CBC with Differential/Platelet  7. S/P right unicompartmental knee replacement - CMP14+EGFR - CBC with Differential/Platelet  8. Microcytic anemia - CMP14+EGFR - CBC with  Differential/Platelet   Labs pending Health Maintenance reviewed Diet and exercise encouraged  Follow up plan: 6 months    Evelina Dun, FNP

## 2019-11-26 NOTE — Patient Instructions (Signed)

## 2019-11-27 LAB — MICROALBUMIN / CREATININE URINE RATIO
Creatinine, Urine: 268.8 mg/dL
Microalb/Creat Ratio: 8 mg/g creat (ref 0–29)
Microalbumin, Urine: 21.1 ug/mL

## 2019-12-03 ENCOUNTER — Inpatient Hospital Stay (HOSPITAL_COMMUNITY): Payer: Medicare Other | Attending: Hematology | Admitting: Hematology

## 2019-12-03 VITALS — BP 142/83 | HR 114 | Temp 97.3°F | Resp 18 | Wt 247.2 lb

## 2019-12-03 DIAGNOSIS — Z7984 Long term (current) use of oral hypoglycemic drugs: Secondary | ICD-10-CM | POA: Insufficient documentation

## 2019-12-03 DIAGNOSIS — Z87891 Personal history of nicotine dependence: Secondary | ICD-10-CM | POA: Diagnosis not present

## 2019-12-03 DIAGNOSIS — D509 Iron deficiency anemia, unspecified: Secondary | ICD-10-CM | POA: Insufficient documentation

## 2019-12-03 DIAGNOSIS — M069 Rheumatoid arthritis, unspecified: Secondary | ICD-10-CM | POA: Insufficient documentation

## 2019-12-03 DIAGNOSIS — E119 Type 2 diabetes mellitus without complications: Secondary | ICD-10-CM | POA: Diagnosis not present

## 2019-12-03 DIAGNOSIS — Z79899 Other long term (current) drug therapy: Secondary | ICD-10-CM | POA: Diagnosis not present

## 2019-12-03 DIAGNOSIS — E538 Deficiency of other specified B group vitamins: Secondary | ICD-10-CM | POA: Insufficient documentation

## 2019-12-03 DIAGNOSIS — D508 Other iron deficiency anemias: Secondary | ICD-10-CM

## 2019-12-03 DIAGNOSIS — I10 Essential (primary) hypertension: Secondary | ICD-10-CM | POA: Diagnosis not present

## 2019-12-03 NOTE — Patient Instructions (Signed)
[facility] at [facility] Discharge Instructions  You were seen today by Dr. Katragadda. He went over your recent results. Dr. Katragadda will see you back in 6 months for labs and follow up.   Thank you for choosing [facility] at [facility] to provide your oncology and hematology care.  To afford each patient quality time with our provider, please arrive at least 15 minutes before your scheduled appointment time.   If you have a lab appointment with the Cancer Center please come in thru the Main Entrance and check in at the main information desk  You need to re-schedule your appointment should you arrive 10 or more minutes late.  We strive to give you quality time with our providers, and arriving late affects you and other patients whose appointments are after yours.  Also, if you no show three or more times for appointments you may be dismissed from the clinic at the providers discretion.     Again, thank you for choosing [facility].  Our hope is that these requests will decrease the amount of time that you wait before being seen by our physicians.       _____________________________________________________________  Should you have questions after your visit to [facility], please contact our office at [phone] between the hours of 8:00 a.m. and 4:30 p.m.  Voicemails left after 4:00 p.m. will not be returned until the following business day.  For prescription refill requests, have your pharmacy contact our office and allow 72 hours.    Cancer Center Support Programs:   > Cancer Support Group  2nd Tuesday of the month 1pm-2pm, Journey Room    

## 2019-12-03 NOTE — Progress Notes (Signed)
[name], [place] [address]   CLINIC:  Medical Oncology/Hematology  PCP:  Sharion Balloon, [place] / [place] [address]  [phone]  REASON FOR VISIT:  Follow-up for iron deficiency anemia  PRIOR THERAPY: None  CURRENT THERAPY: Intermittent Feraheme  INTERVAL HISTORY:  [name], a 64 y.o. male, returns for routine follow-up for his iron deficiency anemia. Anubis was last seen on 09/01/2019.  Today he reports feeling well. He is taking a multivitamin and an iron tablet though not daily since it causes constipation. His arthritis is better, though he still gets pain when is working in his yard; he takes [employer]. His energy levels have been good. He denies melena, hematochezia, hematuria, or numbness.   REVIEW OF SYSTEMS:  Review of Systems  Constitutional: Positive for fatigue (mild). Negative for appetite change.  Gastrointestinal: Positive for constipation and diarrhea. Negative for blood in stool.  Genitourinary: Negative for hematuria.   Musculoskeletal: Positive for arthralgias (7/10 joint pain).  Neurological: Negative for numbness.  All other systems reviewed and are negative.   PAST MEDICAL/SURGICAL HISTORY:  Past Medical History:  Diagnosis Date  . Anemia   . Arthritis    rheumatoid arthritis  . Diabetes mellitus   . Hyperlipidemia   . Hypertension   . Primary localized osteoarthritis of right knee 05/13/2015  . Scapho-lunate dissociation, right   . Vitamin D deficiency    Past Surgical History:  Procedure Laterality Date  . CARDIAC CATHETERIZATION  2005   normal  . CARPAL TUNNEL RELEASE  2009   both hands  . HARDWARE REMOVAL Right 11/14/2017   Procedure: REMOVAL ACCUTRAC SCREW RIGHT WRIST;  Surgeon: Daryll Brod, MD;  Location: [facility];  Service: Orthopedics;  Laterality: Right;  . KNEE ARTHROSCOPY     rt  . PARTIAL KNEE ARTHROPLASTY Right 05/13/2015    Procedure: RIGHT UNI KNEE ARTHROPLASTY;  Surgeon: Marchia Bond, MD;  Location: [facility];  Service: Orthopedics;  Laterality: Right;  . scaphoid excision Left 2012   Dr Daylene Katayama  . TONSILLECTOMY    . WRIST FUSION Right 03/14/2017   Procedure: SCAPHOID EXCISION ULNAR FOUR BONE FUSION RIGHT;  Surgeon: Daryll Brod, MD;  Location: [facility];  Service: Orthopedics;  Laterality: Right;    SOCIAL HISTORY:  Social History   Socioeconomic History  . Marital status: Married    Spouse name: Not on file  . Number of children: 2  . Years of education: Not on file  . Highest education level: Not on file  Occupational History  . Occupation: Disabled  Tobacco Use  . Smoking status: Former Smoker    Quit date: 03/05/1991    Years since quitting: 28.7  . Smokeless tobacco: Never Used  Vaping Use  . Vaping Use: Never used  Substance and Sexual Activity  . Alcohol use: Yes    Comment: rare  . Drug use: No  . Sexual activity: Not on file  Other Topics Concern  . Not on file  Social History Narrative  . Not on file   Social Determinants of Health   Financial Resource Strain:   . Difficulty of Paying Living Expenses: Not on file  Food Insecurity:   . Worried About Charity fundraiser in the Last Year: Not on file  . Ran Out of Food in the Last Year: Not on file  Transportation Needs:   . Lack of Transportation (Medical):  Not on file  . Lack of Transportation (Non-Medical): Not on file  Physical Activity:   . Days of Exercise per Week: Not on file  . Minutes of Exercise per Session: Not on file  Stress:   . Feeling of Stress : Not on file  Social Connections:   . Frequency of Communication with Friends and Family: Not on file  . Frequency of Social Gatherings with Friends and Family: Not on file  . Attends Religious Services: Not on file  . Active Member of Clubs or Organizations: Not on file  . Attends Archivist Meetings: Not on file  .  Marital Status: Not on file  Intimate Partner Violence:   . Fear of Current or Ex-Partner: Not on file  . Emotionally Abused: Not on file  . Physically Abused: Not on file  . Sexually Abused: Not on file    FAMILY HISTORY:  Family History  Problem Relation Age of Onset  . Diabetes Father   . Hypertension Father   . Heart Problems Father   . Stroke Father   . Diabetes Maternal Grandmother   . Arthritis Sister   . Arthritis Sister   . Arthritis Daughter     CURRENT MEDICATIONS:  Current Outpatient Medications  Medication Sig Dispense Refill  . abatacept in sodium chloride 0.9 % Inject into the vein every 30 (thirty) days. Reported on 06/27/2015    . aspirin EC 81 MG tablet Take 81 mg by mouth daily.    [name] glimepiride (AMARYL) 2 MG tablet Take 2 mg by mouth daily.     . hydrochlorothiazide (MICROZIDE) 12.5 MG capsule Take 1 capsule (12.5 mg total) by mouth daily. Needs to be seen for further refills 30 capsule 0  . losartan (COZAAR) 100 MG tablet Take 1 tablet (100 mg total) by mouth daily. 90 tablet 3  . magic mouthwash w/lidocaine SOLN Gargle and spit 63mL every 6 hours as needed for sore throat x7-10 480 mL 0  . meloxicam (MOBIC) 15 MG tablet Take 1 tablet (15 mg total) by mouth daily. 90 tablet 1  . metFORMIN (GLUCOPHAGE) 1000 MG tablet TAKE 1 TABLET IN THE MORNING AND 1&1/2 TABLETS IN THE EVENING 75 tablet 0  . oxyCODONE-acetaminophen (PERCOCET) 10-325 MG tablet Take 1-2 tablets by mouth every 6 (six) hours as needed for pain. MAXIMUM TOTAL ACETAMINOPHEN DOSE IS 4000 MG PER DAY 50 tablet 0  . simvastatin (ZOCOR) 10 MG tablet Take 1 tablet (10 mg total) by mouth daily. 90 tablet 3   No current facility-administered medications for this visit.    ALLERGIES:  Allergies  Allergen Reactions  . Other Shortness Of Breath, Swelling and Hives  . Shellfish Allergy Shortness Of Breath and Swelling  . Leflunomide     LFT ELEVATION  . Methotrexate     LFT elevation  . Canagliflozin  Other (See Comments)    Causes bladder infections Causes bladder infections Causes bladder infections  . Cephalexin Other (See Comments)    Pt does not tol-nervous Pt does not tol-nervous Pt does not tol-nervous  . Doxycycline Nausea And Vomiting  . Dulaglutide Diarrhea and Nausea Only    PHYSICAL EXAM:  Performance status (ECOG): 0 - Asymptomatic  Vitals:   12/03/19 0754  BP: (!) 142/83  Pulse: (!) 114  Resp: 18  Temp: (!) 97.3 F (36.3 C)  SpO2: 96%   Wt Readings from Last 3 Encounters:  12/03/19 247 lb 3.2 oz (112.1 kg)  11/26/19 243 lb 9.6 oz (110.5  kg)  09/14/19 241 lb (109.3 kg)   Physical Exam Vitals reviewed.  Constitutional:      Appearance: Normal appearance. He is obese.  Cardiovascular:     Rate and Rhythm: Normal rate and regular rhythm.     Pulses: Normal pulses.     Heart sounds: Normal heart sounds.  Pulmonary:     Effort: Pulmonary effort is normal.     Breath sounds: Normal breath sounds.  Neurological:     General: No focal deficit present.     Mental Status: He is alert and oriented to person, place, and time.  Psychiatric:        Mood and Affect: Mood normal.        Behavior: Behavior normal.     LABORATORY DATA:  I have reviewed the labs as listed.  CBC Latest Ref Rng & Units 11/26/2019 08/25/2019 06/01/2019  WBC 3.4 - 10.8 x10E3/uL 10.1 7.8 8.9  Hemoglobin 13.0 - 17.7 g/dL 14.9 13.9 14.8  Hematocrit 37.5 - 51.0 % 44.0 42.1 41.5  Platelets 150 - 450 x10E3/uL 278 265 253   CMP Latest Ref Rng & Units 11/26/2019 08/25/2019 06/01/2019  Glucose 65 - 99 mg/dL 153(H) 125(H) 148(H)  BUN 8 - 27 mg/dL 14 20 17   Creatinine 0.76 - 1.27 mg/dL 1.18 1.17 1.19  Sodium 134 - 144 mmol/L 138 140 138  Potassium 3.5 - 5.2 mmol/L 4.5 4.3 4.3  Chloride 96 - 106 mmol/L 101 100 99  CO2 20 - 29 mmol/L 23 25 20   Calcium 8.6 - 10.2 mg/dL 9.5 9.4 9.4  Total Protein 6.0 - 8.5 g/dL 6.8 7.1 6.6  Total Bilirubin 0.0 - 1.2 mg/dL 0.2 0.8 <0.2  Alkaline Phos 48 - 121  IU/L 71 57 67  AST 0 - 40 IU/L 27 28 16   ALT 0 - 44 IU/L 28 26 25       Component Value Date/Time   RBC 4.84 11/26/2019 0920   RBC 4.56 08/25/2019 1036   MCV 91 11/26/2019 0920   MCH 30.8 11/26/2019 0920   MCH 30.5 08/25/2019 1036   MCHC 33.9 11/26/2019 0920   MCHC 33.0 08/25/2019 1036   RDW 12.9 11/26/2019 0920   LYMPHSABS 3.4 (H) 11/26/2019 0920   MONOABS 0.5 08/25/2019 1036   EOSABS 0.7 (H) 11/26/2019 0920   BASOSABS 0.1 11/26/2019 0920    DIAGNOSTIC IMAGING:  I have independently reviewed the scans and discussed with the patient. No results found.   ASSESSMENT:  1.  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 -He does not report any bleeding per rectum or melena.  Energy levels are good. -We reviewed labs from 08/25/2019.  Hemoglobin is 13.9.  Ferritin has decreased to 16 from 101.  Vitamin B12 was 184. -He was recommended to start taking iron tablet daily with stool softener.  We will plan to repeat his labs in 3 months. -He wants to put off his screening colonoscopy until after COVID-19 is cleared completely.  2.  B12 deficiency: -His B12 level is borderline at 184.  I have recommended him to start taking B12 1 mg tablet daily.  3.  Rheumatoid arthritis: -His arthritis involves small joints of the hands and right shoulder.  He is on Orencia every 4 weeks.  4.  Tobacco abuse: -He quit smoking in 1992.  Smoked 1 and 1/2 pack/day for 20 years.   PLAN:  1.  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: -We reviewed labs  from 11/26/2019 which shows hemoglobin improved to 14.9.  MCV is 91. -He is taking multivitamin with iron in it about 3 times a week.  If he takes every day, he gets constipated. -As his hemoglobin went up, I would not check his ferritin today. -I have asked him to continue multivitamin and come back to [place] in 6 months with repeat labs.  He does not require any  parenteral iron therapy.  2.  B12 deficiency: -Continue multivitamin with B12.  I plan to check B12 next visit.  3.  Rheumatoid arthritis: -Continue monthly Orencia.   Orders placed this encounter:  No orders of the defined types were placed in this encounter.    Derek Jack, MD [place] [phone]   I, Milinda Antis, am acting as a scribe for Dr. Sanda Linger.  I, Derek Jack MD, have reviewed the above documentation for accuracy and completeness, and I agree with the above.

## 2019-12-04 ENCOUNTER — Other Ambulatory Visit: Payer: Self-pay | Admitting: Family

## 2019-12-04 DIAGNOSIS — E1165 Type 2 diabetes mellitus with hyperglycemia: Secondary | ICD-10-CM

## 2020-01-01 ENCOUNTER — Other Ambulatory Visit: Payer: Self-pay | Admitting: Family

## 2020-01-22 ENCOUNTER — Other Ambulatory Visit: Payer: Self-pay | Admitting: Orthopedic Surgery

## 2020-01-22 ENCOUNTER — Telehealth: Payer: Self-pay | Admitting: *Deleted

## 2020-01-22 DIAGNOSIS — M25511 Pain in right shoulder: Secondary | ICD-10-CM

## 2020-01-22 NOTE — Telephone Encounter (Signed)
   [facility] Pre-operative Risk Assessment    HEARTCARE STAFF: - Please ensure there is not already an duplicate clearance open for this procedure. - Under Visit Info/Reason for Call, type in Other and utilize the format Clearance MM/DD/YY or Clearance TBD. Do not use dashes or single digits. - If request is for dental extraction, please clarify the # of teeth to be extracted.  Request for surgical clearance:  1. What type of surgery is being performed? RIGHT TOTAL SHOULDER REPLACEMENT   2. When is this surgery scheduled? TBD   3. What type of clearance is required (medical clearance vs. Pharmacy clearance to hold med vs. Both)? MEDICAL  4. Are there any medications that need to be held prior to surgery and how long? ASA   5. Practice name and name of physician performing surgery? [facility]; DR. Vonna Kotyk [facility]     6. What is the office phone number? [phone]   7.   What is the office fax number? [place].   Anesthesia type (None, local, MAC, general) ? CHOICE   Julaine Hua 01/22/2020, 2:36 PM  _________________________________________________________________   (provider comments below)

## 2020-01-25 NOTE — Telephone Encounter (Signed)
   Primary Cardiologist: Jenkins Rouge, MD  Chart reviewed as part of pre-operative protocol coverage. Patient was contacted 01/25/2020 in reference to pre-operative risk assessment for pending surgery as outlined below.  [name] was last seen on 09/14/19 by Dr. Johnsie Cancel.  Since that day, [name] has done well.  He can complete 4.0 METS, but is limited by arthritis. When I spoke with the patient, he stated that he does not want to complete surgery until 2022. He may hold ASA 5-7 days prior to surgery, if required by surgeon.   Therefore, based on ACC/AHA guidelines, the patient would be at acceptable risk for the planned procedure without further cardiovascular testing.   The patient was advised that if he develops new symptoms prior to surgery to contact our office to arrange for a follow-up visit, and he verbalized understanding.  I will route this recommendation to the requesting party via Epic fax function and remove from pre-op pool. Please call with questions.  Tami Lin Rieley Khalsa, PA 01/25/2020, 11:39 AM

## 2020-02-05 ENCOUNTER — Other Ambulatory Visit: Payer: Medicare Other

## 2020-02-05 ENCOUNTER — Ambulatory Visit
Admission: RE | Admit: 2020-02-05 | Discharge: 2020-02-05 | Disposition: A | Discharge status: Home / Self Care | Payer: Medicare Other | Source: Ambulatory Visit | Attending: Orthopedic Surgery | Admitting: Orthopedic Surgery

## 2020-02-05 DIAGNOSIS — M25511 Pain in right shoulder: Secondary | ICD-10-CM

## 2020-02-26 ENCOUNTER — Other Ambulatory Visit: Payer: Self-pay

## 2020-02-26 ENCOUNTER — Ambulatory Visit (INDEPENDENT_AMBULATORY_CARE_PROVIDER_SITE_OTHER): Payer: Medicare Other | Admitting: Family

## 2020-02-26 ENCOUNTER — Encounter: Payer: Self-pay | Admitting: Family

## 2020-02-26 VITALS — BP 133/68 | HR 70 | Temp 97.5°F | Ht 70.0 in | Wt 243.4 lb

## 2020-02-26 DIAGNOSIS — E785 Hyperlipidemia, unspecified: Secondary | ICD-10-CM

## 2020-02-26 DIAGNOSIS — Z1211 Encounter for screening for malignant neoplasm of colon: Secondary | ICD-10-CM

## 2020-02-26 DIAGNOSIS — E1169 Type 2 diabetes mellitus with other specified complication: Secondary | ICD-10-CM

## 2020-02-26 DIAGNOSIS — E1165 Type 2 diabetes mellitus with hyperglycemia: Secondary | ICD-10-CM | POA: Diagnosis not present

## 2020-02-26 DIAGNOSIS — M059 Rheumatoid arthritis with rheumatoid factor, unspecified: Secondary | ICD-10-CM | POA: Diagnosis not present

## 2020-02-26 DIAGNOSIS — Z01818 Encounter for other preprocedural examination: Secondary | ICD-10-CM

## 2020-02-26 DIAGNOSIS — B3789 Other sites of candidiasis: Secondary | ICD-10-CM

## 2020-02-26 DIAGNOSIS — J301 Allergic rhinitis due to pollen: Secondary | ICD-10-CM

## 2020-02-26 DIAGNOSIS — E1159 Type 2 diabetes mellitus with other circulatory complications: Secondary | ICD-10-CM | POA: Diagnosis not present

## 2020-02-26 DIAGNOSIS — I152 Hypertension secondary to endocrine disorders: Secondary | ICD-10-CM

## 2020-02-26 LAB — BAYER DCA HB A1C WAIVED: HB A1C (BAYER DCA - WAIVED): 7.1 % — ABNORMAL HIGH (ref <7.0)

## 2020-02-26 MED ORDER — CETIRIZINE HCL 10 MG PO TABS: 10.0000 mg | ORAL_TABLET | Freq: Every day | ORAL | 11 refills | Status: DC

## 2020-02-26 MED ORDER — FLUTICASONE PROPIONATE 50 MCG/ACT NA SUSP: 2.0000 | Freq: Every day | NASAL | 6 refills | Status: DC

## 2020-02-26 MED ORDER — LOSARTAN POTASSIUM 100 MG PO TABS: 100.0000 mg | ORAL_TABLET | Freq: Every day | ORAL | 3 refills | Status: DC

## 2020-02-26 MED ORDER — METFORMIN HCL 1000 MG PO TABS: ORAL_TABLET | ORAL | 1 refills | Status: DC

## 2020-02-26 MED ORDER — SIMVASTATIN 10 MG PO TABS: 10.0000 mg | ORAL_TABLET | Freq: Every day | ORAL | 3 refills | Status: DC

## 2020-02-26 MED ORDER — GLIMEPIRIDE 2 MG PO TABS: 2.0000 mg | ORAL_TABLET | Freq: Every day | ORAL | 2 refills | Status: DC

## 2020-02-26 MED ORDER — HYDROCHLOROTHIAZIDE 12.5 MG PO CAPS: ORAL_CAPSULE | ORAL | 1 refills | Status: DC

## 2020-02-26 MED ORDER — KETOCONAZOLE 2 % EX CREA: 1.0000 "application " | TOPICAL_CREAM | Freq: Every day | CUTANEOUS | 2 refills | Status: DC

## 2020-02-26 NOTE — Progress Notes (Signed)
Subjective:    Patient ID: [name], male    DOB: April 22, 1955, 64 y.o.   MRN: [id number]  Chief Complaint  Patient presents with   Hypertension   Diabetes   Nasal Congestion    at night x 1 week    PTcallsto the office today forchronic follow up. Pt has rheumatoid arthritis and followed by rheumatologists every 6 months.Pt is followed by Cardiologists annually. He is followed by Hematologists for iron deficiency.  Pt is in the process of getting right shoulder replaced. He has already been cleared by Cardiologists. He reports his pain is 4 out 10.  Hypertension This is a chronic problem. The current episode started more than 1 year ago. The problem has been resolved since onset. The problem is controlled. Associated symptoms include malaise/fatigue. Pertinent negatives include no blurred vision, peripheral edema or shortness of breath. Risk factors for coronary artery disease include diabetes mellitus, obesity, male gender and sedentary lifestyle. The current treatment provides moderate improvement. There is no history of CVA or heart failure.  Diabetes He presents for his follow-up diabetic visit. He has type 2 diabetes mellitus. His disease course has been stable. Pertinent negatives for diabetes include no blurred vision and no foot paresthesias. Pertinent negatives for hypoglycemia complications include no blackouts and no hospitalization. Symptoms are stable. Pertinent negatives for diabetic complications include no CVA. An ACE inhibitor/angiotensin II receptor blocker is being taken.  Hyperlipidemia This is a chronic problem. The current episode started more than 1 year ago. The problem is controlled. Recent lipid tests were reviewed and are normal. Exacerbating diseases include obesity. Pertinent negatives include no shortness of breath. He is currently on no antihyperlipidemic treatment. The current treatment provides mild improvement of lipids. Risk factors for coronary  artery disease include diabetes mellitus, hypertension, post-menopausal, male sex, obesity and dyslipidemia.  Arthritis Presents for follow-up visit. He complains of pain and stiffness. Affected locations include the left MCP, right MCP, left shoulder and right shoulder (back). His pain is at a severity of 6/10. Associated symptoms include rash.  Anemia Presents for follow-up visit. Symptoms include malaise/fatigue. There is no history of heart failure.  Rash This is a recurrent problem. The current episode started more than 1 month ago. The problem has been waxing and waning since onset. The affected locations include the groin. The rash is characterized by itchiness and redness. Pertinent negatives include no shortness of breath. Treatments tried: antifungal. The treatment provided moderate relief.      Review of Systems  Constitutional: Positive for malaise/fatigue.  Eyes: Negative for blurred vision.  Respiratory: Negative for shortness of breath.   Musculoskeletal: Positive for arthritis and stiffness.  Skin: Positive for rash.  All other systems reviewed and are negative.      Objective:   Physical Exam Vitals reviewed.  Constitutional:      General: He is not in acute distress.    Appearance: He is well-developed.  HENT:     Head: Normocephalic.     Right Ear: Tympanic membrane normal.     Left Ear: Tympanic membrane normal.  Eyes:     General:        Right eye: No discharge.        Left eye: No discharge.     Pupils: Pupils are equal, round, and reactive to light.  Neck:     Thyroid: No thyromegaly.  Cardiovascular:     Rate and Rhythm: Normal rate and regular rhythm.     Heart sounds:  Normal heart sounds. No murmur heard.   Pulmonary:     Effort: Pulmonary effort is normal. No respiratory distress.     Breath sounds: Normal breath sounds. No wheezing.  Abdominal:     General: Bowel sounds are normal. There is no distension.     Palpations: Abdomen is soft.      Tenderness: There is no abdominal tenderness.  Musculoskeletal:        General: Tenderness present.     Cervical back: Normal range of motion and neck supple.     Comments: Decreased ROM of right shoulder with abduction, pain in lumbar with flexion  Skin:    General: Skin is warm and dry.     Findings: No erythema or rash.  Neurological:     Mental Status: He is alert and oriented to person, place, and time.     Cranial Nerves: No cranial nerve deficit.     Deep Tendon Reflexes: Reflexes are normal and symmetric.  Psychiatric:        Behavior: Behavior normal.        Thought Content: Thought content normal.        Judgment: Judgment normal.       BP 133/68    Pulse 70    Temp (!) 97.5 F (36.4 C) (Temporal)    Ht _0  (1.778 m)    Wt 243 lb 6.4 oz (110.4 kg)    SpO2 96%    BMI 34.92 kg/m   Assessment & Plan:  [name] comes in today with chief complaint of Hypertension, Diabetes, and Nasal Congestion (at night x 1 week )   Diagnosis and orders addressed:  1. Type 2 diabetes mellitus with hyperglycemia, without long-term current use of insulin (HCC) - Bayer DCA Hb A1c Waived - metFORMIN (GLUCOPHAGE) 1000 MG tablet; TAKE 1 TABLET IN THE MORNING AND 1&1/2 TABLETS IN THE EVENING  Dispense: 225 tablet; Refill: 1 - glimepiride (AMARYL) 2 MG tablet; Take 1 tablet (2 mg total) by mouth daily.  Dispense: 90 tablet; Refill: 2 - CMP14+EGFR - CBC with Differential/Platelet  2. Hypertension associated with diabetes ([place]) - losartan (COZAAR) 100 MG tablet; Take 1 tablet (100 mg total) by mouth daily.  Dispense: 90 tablet; Refill: 3 - hydrochlorothiazide (MICROZIDE) 12.5 MG capsule; TAKE (1) CAPSULE DAILY  Dispense: 90 capsule; Refill: 1 - CMP14+EGFR - CBC with Differential/Platelet  3. Hyperlipidemia associated with type 2 diabetes mellitus (HCC) - CMP14+EGFR - CBC with Differential/Platelet  4. Rheumatoid arthritis with positive rheumatoid factor, involving unspecified site  ([place]) - CMP14+EGFR - CBC with Differential/Platelet  5. Morbid obesity ([place]) - CMP14+EGFR - CBC with Differential/Platelet  6. Colon cancer screening - Cologuard - CMP14+EGFR - CBC with Differential/Platelet  7. Candida rash of groin - ketoconazole (NIZORAL) 2 % cream; Apply 1 application topically daily.  Dispense: 30 g; Refill: 2  8. Allergic rhinitis due to pollen, unspecified seasonality - cetirizine (ZYRTEC) 10 MG tablet; Take 1 tablet (10 mg total) by mouth daily.  Dispense: 30 tablet; Refill: 11 - fluticasone (FLONASE) 50 MCG/ACT nasal spray; Place 2 sprays into both nostrils daily.  Dispense: 16 g; Refill: 6  9. Pre-op exam PT has already been cleared by Cardiologists   Labs pending Health Maintenance reviewed Diet and exercise encouraged  Follow up plan: 6 months    Evelina Dun, FNP

## 2020-02-26 NOTE — Patient Instructions (Signed)
Shoulder Joint Replacement, Care After This sheet gives you information about how to care for yourself after your procedure. Your health care provider may also give you more specific instructions. If you have problems or questions, contact your health care provider. What can I expect after the procedure? After your procedure, it is common to have:  A bruised and stiff shoulder.  A bruised and stiff arm.  Some pain. Follow these instructions at home: If you have a sling:   Wear the sling as told by your health care provider. Remove it only as told by your health care provider.  Loosen the slingif your fingers tingle, become numb, or turn cold and blue.  Keep the sling clean.  If the sling is not waterproof: ? Do not let it get wet. ? Cover it with a watertight covering when you take a bath or a shower. Bathing  Do not take baths, swim, or use a hot tub until your health care provider approves. Ask your health care provider if you can take showers. You may only be allowed to take sponge baths for bathing.  If your sling is not waterproof, cover it with a watertight covering when you take a bath or a shower.  Keep the bandage (dressing) dry until your health care provider says it can be removed. Managing pain, stiffness, and swelling  If directed, put ice on the affected area. ? If you have a removable sling, remove it as told by your health care provider. ? Put ice in a plastic bag. ? Place a towel between your skin and the bag. ? Leave the ice on for 20 minutes, 2-3 times a day.  Move your fingers often to avoid stiffness and to lessen swelling. Driving  Do not drive or use heavy machinery while taking prescription pain medicine.  Do not drive for 2-4 weeks after surgery or as told by your health care provider. Medicine  Take over-the-counter and prescription medicines only as told by your health care provider.  If you were prescribed an antibiotic medicine, use it as  told by your health care provider. Do not stop using the antibiotic even if you start to feel better. Incision care  Follow instructions from your health care provider about how to take care of your incision area. Make sure you: ? Wash your hands with soap and water before you change your bandage (dressing). If soap and water are not available, use hand sanitizer. ? Change your dressing as told by your health care provider. ? Leave staples, stitches (sutures), skin glue, or adhesive strips in place. These skin closures may need to stay in place for 2 weeks or longer. If adhesive strip edges start to loosen and curl up, you may trim the loose edges. Do not remove adhesive strips completely unless your health care provider tells you to do that.  If you have a tube to remove drainage, follow instructions from your health care provider about caring for it. Do not remove the drain tube or any dressings around the tube opening unless your health care provider approves.  Check your incision area every day for signs of infection. Check for: ? More redness, swelling, or pain. ? More fluid or blood. ? Warmth. ? Pus or a bad smell. Activity  Do not use your arm to push yourself up in bed or from a chair. This requires too much muscle.  Follow lifting restrictions as told: ? Do not lift anything that is heavier than a cup  of coffee for the first 6 weeks after surgery, or as told by your health care provider. ? Do not lift anything that is heavier than 10 lb (4.5 kg) for 6 months, or as told by your health care provider.  Do exercises, including physical therapy, as told by your health care provider.  Try not to overuse your shoulder. This includes repetitive pushing or pulling. Early overuse of the shoulder may result in later problems. (Overusing the shoulder is easy to do when your pain goes away for the first time.)  Avoid overstretching your arm for 6 weeks after surgery, or as told by your health  care provider.  Avoid sitting for a long time without moving. Get up and move around one or more times every few hours.  Ask for help with some activities. Your health care provider may be able to suggest a clinic or agency for this if you do not have home support.  Do not participate in contact sports. General instructions  Keep all follow-up visits as told by your health care provider. This is important.  Do not use any products that contain nicotine or tobacco, such as cigarettes and e-cigarettes. These can delay healing. If you need help quitting, ask your health care provider. Contact a health care provider if:  You develop a rash.  You have a fever.  You have more redness, swelling, or pain in the incision area.  You have more fluid or blood coming from your incision area.  You have more pain when moving your shoulder. Get help right away if:  Your incision area feels warm to the touch.  You have pus or a bad smell coming from your incision area.  The edges of the incision site break open after sutures have been removed.  You have chest pain or shortness of breath. Summary  It is common to have pain and stiffness in your shoulder and arm after the procedure. Put ice on the affected area and take pain medicine as told by your health care provider.  Do not use your arm to push yourself up in bed or from a chair.  Do exercises, including physical therapy, as told by your health care provider.  Check your incision area daily. Call your health care provider if you see signs of infection. This information is not intended to replace advice given to you by your health care provider. Make sure you discuss any questions you have with your health care provider. Document Revised: 07/18/2018 Document Reviewed: 01/09/2016 Elsevier Patient Education  2020 [employer].

## 2020-02-27 LAB — CMP14+EGFR
ALT: 20 IU/L (ref 0–44)
AST: 21 IU/L (ref 0–40)
Albumin/Globulin Ratio: 1.8 (ref 1.2–2.2)
Albumin: 4.5 g/dL (ref 3.8–4.8)
Alkaline Phosphatase: 67 IU/L (ref 44–121)
BUN/Creatinine Ratio: 17 (ref 10–24)
BUN: 18 mg/dL (ref 8–27)
Bilirubin Total: 0.3 mg/dL (ref 0.0–1.2)
CO2: 20 mmol/L (ref 20–29)
Calcium: 9.3 mg/dL (ref 8.6–10.2)
Chloride: 102 mmol/L (ref 96–106)
Creatinine, Ser: 1.05 mg/dL (ref 0.76–1.27)
GFR calc Af Amer: 85 mL/min/{1.73_m2} (ref >59)
GFR calc non Af Amer: 74 mL/min/{1.73_m2} (ref >59)
Globulin, Total: 2.5 g/dL (ref 1.5–4.5)
Glucose: 118 mg/dL — ABNORMAL HIGH (ref 65–99)
Potassium: 4.2 mmol/L (ref 3.5–5.2)
Sodium: 140 mmol/L (ref 134–144)
Total Protein: 7 g/dL (ref 6.0–8.5)

## 2020-02-27 LAB — CBC WITH DIFFERENTIAL/PLATELET
Basophils Absolute: 0.1 10*3/uL (ref 0.0–0.2)
Basos: 1 %
EOS (ABSOLUTE): 0.6 10*3/uL — ABNORMAL HIGH (ref 0.0–0.4)
Eos: 5 %
Hematocrit: 41.7 % (ref 37.5–51.0)
Hemoglobin: 14.6 g/dL (ref 13.0–17.7)
Immature Grans (Abs): 0.1 10*3/uL (ref 0.0–0.1)
Immature Granulocytes: 1 %
Lymphocytes Absolute: 3 10*3/uL (ref 0.7–3.1)
Lymphs: 25 %
MCH: 31 pg (ref 26.6–33.0)
MCHC: 35 g/dL (ref 31.5–35.7)
MCV: 89 fL (ref 79–97)
Monocytes Absolute: 0.9 10*3/uL (ref 0.1–0.9)
Monocytes: 8 %
Neutrophils Absolute: 7.3 10*3/uL — ABNORMAL HIGH (ref 1.4–7.0)
Neutrophils: 60 %
Platelets: 322 10*3/uL (ref 150–450)
RBC: 4.71 x10E6/uL (ref 4.14–5.80)
RDW: 12.2 % (ref 11.6–15.4)
WBC: 12 10*3/uL — ABNORMAL HIGH (ref 3.4–10.8)

## 2020-05-04 ENCOUNTER — Other Ambulatory Visit: Payer: Self-pay

## 2020-05-04 ENCOUNTER — Ambulatory Visit (INDEPENDENT_AMBULATORY_CARE_PROVIDER_SITE_OTHER): Payer: Medicare Other | Admitting: Family Medicine

## 2020-05-04 ENCOUNTER — Encounter: Payer: Self-pay | Admitting: Family Medicine

## 2020-05-04 VITALS — BP 139/82 | HR 70 | Ht 70.0 in | Wt 244.0 lb

## 2020-05-04 DIAGNOSIS — R252 Cramp and spasm: Secondary | ICD-10-CM | POA: Diagnosis not present

## 2020-05-04 DIAGNOSIS — Z1211 Encounter for screening for malignant neoplasm of colon: Secondary | ICD-10-CM

## 2020-05-04 DIAGNOSIS — E1165 Type 2 diabetes mellitus with hyperglycemia: Secondary | ICD-10-CM

## 2020-05-04 DIAGNOSIS — B372 Candidiasis of skin and nail: Secondary | ICD-10-CM | POA: Diagnosis not present

## 2020-05-04 LAB — BAYER DCA HB A1C WAIVED: HB A1C (BAYER DCA - WAIVED): 7.2 % — ABNORMAL HIGH (ref <7.0)

## 2020-05-04 MED ORDER — TERBINAFINE HCL 1 % EX CREA: 1.0000 "application " | TOPICAL_CREAM | Freq: Two times a day (BID) | CUTANEOUS | 1 refills | Status: DC

## 2020-05-04 NOTE — Progress Notes (Signed)
BP 139/82   Pulse 70   Ht 5\' 10"  (1.778 m)   Wt 244 lb (110.7 kg)   SpO2 99%   BMI 35.01 kg/m    Subjective:   Patient ID: [name], male    DOB: 1956-03-21, 65 y.o.   MRN: [id number]  HPI: [name] is a 65 y.o. male presenting on 05/04/2020 for Medical Management of Chronic Issues and Diabetes   HPI Patient is coming in today for a rash that he has been getting in his groin.  He says is been going off and on for at least a few months that he has had it before but it gets worse and that he gets cracking and splitting.  He has been prescribed a cream for it before and it did help then it keeps coming back.  Patient is coming in complaining of irritation or in the perianal area, he did have some hemorrhoids before but he says the hemorrhoids are resolved but he still having the irritation back there.  Patient denies any rectal bleeding.  Patient denies any fevers or chills.  Patient is complaining of cramping in his legs, mainly at night, he says he took some mustard once and it helps, he says it does not happen every night but happens some nights and then he will get up and walk around and it makes it better.  He says is been happening more frequently over the past few months that he has noticed it.  Relevant past medical, surgical, family and social history reviewed and updated as indicated. Interim medical history since our last visit reviewed. Allergies and medications reviewed and updated.  Review of Systems  Constitutional: Negative for chills and fever.  Respiratory: Negative for shortness of breath and wheezing.   Cardiovascular: Negative for chest pain and leg swelling.  Musculoskeletal: Positive for myalgias. Negative for back pain and gait problem.  Skin: Positive for rash.  All other systems reviewed and are negative.   Per HPI unless specifically indicated above   Allergies as of 05/04/2020      Reactions   Other Shortness Of Breath, Swelling, Hives    Shellfish Allergy Shortness Of Breath, Swelling   Leflunomide    LFT ELEVATION   Methotrexate    LFT elevation   Canagliflozin Other (See Comments)   Causes bladder infections Causes bladder infections Causes bladder infections   Cephalexin Other (See Comments)   Pt does not tol-nervous Pt does not tol-nervous Pt does not tol-nervous   Doxycycline Nausea And Vomiting   Dulaglutide Diarrhea, Nausea Only      Medication List       Accurate as of May 04, 2020 12:06 PM. If you have any questions, ask your nurse or doctor.        STOP taking these medications   aspirin EC 81 MG tablet Stopped by: Worthy Rancher, MD   cetirizine 10 MG tablet Commonly known as: ZYRTEC Stopped by: Worthy Rancher, MD   fluticasone 50 MCG/ACT nasal spray Commonly known as: FLONASE Stopped by: Fransisca Kaufmann Ancil Dewan, MD   ketoconazole 2 % cream Commonly known as: NIZORAL Stopped by: Worthy Rancher, MD   magic mouthwash w/lidocaine Soln Stopped by: Fransisca Kaufmann Idus Rathke, MD     TAKE these medications   abatacept in sodium chloride 0.9 % Inject into the vein every 30 (thirty) days. Reported on 06/27/2015   glimepiride 2 MG tablet Commonly known as: AMARYL Take 1 tablet (2 mg total)  by mouth daily.   hydrochlorothiazide 12.5 MG capsule Commonly known as: MICROZIDE TAKE (1) CAPSULE DAILY   losartan 100 MG tablet Commonly known as: COZAAR Take 1 tablet (100 mg total) by mouth daily.   meloxicam 15 MG tablet Commonly known as: MOBIC TAKE 1 TABELT ONCE DAILY   metFORMIN 1000 MG tablet Commonly known as: GLUCOPHAGE TAKE 1 TABLET IN THE MORNING AND 1&1/2 TABLETS IN THE EVENING   oxyCODONE-acetaminophen 10-325 MG tablet Commonly known as: Percocet Take 1-2 tablets by mouth every 6 (six) hours as needed for pain. MAXIMUM TOTAL ACETAMINOPHEN DOSE IS 4000 MG PER DAY   simvastatin 10 MG tablet Commonly known as: ZOCOR Take 1 tablet (10 mg total) by mouth daily.         Objective:   BP 139/82   Pulse 70   Ht 5\' 10"  (1.778 m)   Wt 244 lb (110.7 kg)   SpO2 99%   BMI 35.01 kg/m   Wt Readings from Last 3 Encounters:  05/04/20 244 lb (110.7 kg)  02/26/20 243 lb 6.4 oz (110.4 kg)  12/03/19 247 lb 3.2 oz (112.1 kg)    Physical Exam Vitals and nursing note reviewed.  Constitutional:      General: He is not in acute distress.    Appearance: He is well-developed and well-nourished. He is not diaphoretic.  Eyes:     General: No scleral icterus.    Extraocular Movements: EOM normal.  Neck:     Thyroid: No thyromegaly.  Cardiovascular:     Pulses: Intact distal pulses.  Musculoskeletal:        General: No edema.  Skin:    General: Skin is warm and dry.     Findings: Rash (Rash in the intratracheal folds anteriorly and in the perirectal area, pink with some cracking of the skin, irregular borders with satellite lesion, very irritated.) present.  Neurological:     Mental Status: He is alert and oriented to person, place, and time.     Coordination: Coordination normal.  Psychiatric:        Mood and Affect: Mood and affect normal.        Behavior: Behavior normal.       Assessment & Plan:   Problem List Items Addressed This Visit      Endocrine   Diabetes mellitus ([place])   Relevant Orders   CBC with Differential/Platelet   Bayer DCA Hb A1c Waived    Other Visit Diagnoses    Yeast dermatitis    -  Primary   Relevant Medications   terbinafine (LAMISIL) 1 % cream   Colon cancer screening       Relevant Orders   Cologuard   Leg cramps          Patient also had his A1c rechecked today and did referral for colon cancer screening.  Cologuard  Patient's rash is consistent with yeast dermatitis, will send cream form but recommended for him to use Vaseline with the cream until the cracking is gone and then just the cream until the rash is gone and then use powder every day after that to keep it dry.  This should help it from coming  back.  For cramping recommended him to take over-the-counter potassium every day. Follow up plan: Return if symptoms worsen or fail to improve.  Counseling provided for all of the vaccine components Orders Placed This Encounter  Procedures  . CBC with Differential/Platelet  . Bayer DCA Hb A1c Waived  . Cologuard  Caryl Pina, MD [facility] 05/04/2020, 12:06 PM

## 2020-05-05 LAB — CBC WITH DIFFERENTIAL/PLATELET
Basophils Absolute: 0.1 10*3/uL (ref 0.0–0.2)
Basos: 1 %
EOS (ABSOLUTE): 0.6 10*3/uL — ABNORMAL HIGH (ref 0.0–0.4)
Eos: 7 %
Hematocrit: 40.8 % (ref 37.5–51.0)
Hemoglobin: 13.7 g/dL (ref 13.0–17.7)
Immature Grans (Abs): 0 10*3/uL (ref 0.0–0.1)
Immature Granulocytes: 0 %
Lymphocytes Absolute: 2.8 10*3/uL (ref 0.7–3.1)
Lymphs: 34 %
MCH: 29.8 pg (ref 26.6–33.0)
MCHC: 33.6 g/dL (ref 31.5–35.7)
MCV: 89 fL (ref 79–97)
Monocytes Absolute: 0.6 10*3/uL (ref 0.1–0.9)
Monocytes: 7 %
Neutrophils Absolute: 4.1 10*3/uL (ref 1.4–7.0)
Neutrophils: 51 %
Platelets: 267 10*3/uL (ref 150–450)
RBC: 4.59 x10E6/uL (ref 4.14–5.80)
RDW: 11.4 % — ABNORMAL LOW (ref 11.6–15.4)
WBC: 8.2 10*3/uL (ref 3.4–10.8)

## 2020-05-16 DIAGNOSIS — M48061 Spinal stenosis, lumbar region without neurogenic claudication: Secondary | ICD-10-CM | POA: Diagnosis not present

## 2020-05-24 DIAGNOSIS — Z79899 Other long term (current) drug therapy: Secondary | ICD-10-CM | POA: Diagnosis not present

## 2020-05-24 DIAGNOSIS — M0609 Rheumatoid arthritis without rheumatoid factor, multiple sites: Secondary | ICD-10-CM | POA: Diagnosis not present

## 2020-05-31 ENCOUNTER — Other Ambulatory Visit: Payer: Self-pay

## 2020-05-31 ENCOUNTER — Ambulatory Visit (INDEPENDENT_AMBULATORY_CARE_PROVIDER_SITE_OTHER): Payer: Medicare Other | Admitting: Family

## 2020-05-31 ENCOUNTER — Encounter: Payer: Self-pay | Admitting: Family

## 2020-05-31 ENCOUNTER — Ambulatory Visit: Payer: Medicare Other | Admitting: Family

## 2020-05-31 VITALS — BP 129/80 | HR 68 | Temp 97.4°F | Ht 70.0 in | Wt 244.6 lb

## 2020-05-31 DIAGNOSIS — K120 Recurrent oral aphthae: Secondary | ICD-10-CM | POA: Diagnosis not present

## 2020-05-31 DIAGNOSIS — B372 Candidiasis of skin and nail: Secondary | ICD-10-CM | POA: Diagnosis not present

## 2020-05-31 MED ORDER — TRIAMCINOLONE ACETONIDE 0.1 % MT PSTE: 1.0000 "application " | PASTE | Freq: Two times a day (BID) | OROMUCOSAL | 12 refills | Status: DC

## 2020-05-31 MED ORDER — NYSTATIN 100000 UNIT/GM EX POWD: 1.0000 "application " | Freq: Three times a day (TID) | CUTANEOUS | 0 refills | Status: DC

## 2020-05-31 MED ORDER — KETOCONAZOLE 2 % EX CREA: 1.0000 "application " | TOPICAL_CREAM | Freq: Every day | CUTANEOUS | 0 refills | Status: DC

## 2020-05-31 MED ORDER — CLOTRIMAZOLE 10 MG MT TROC: 10.0000 mg | Freq: Every day | OROMUCOSAL | 3 refills | Status: DC

## 2020-05-31 NOTE — Progress Notes (Signed)
Subjective:    Patient ID: [name], male    DOB: 1956-03-21, 65 y.o.   MRN: [id number]  Chief Complaint  Patient presents with  . Oral Pain    Feels like it is raw     HPI Pt presents to the office today with mouth sores that started about 3 weeks ago. He does have RA and is taking immune suppressants.   He reports the area is sore is 2 out 10 that is worse if he eats something salty or spicy.   He also has a rash in his groin. He  Was seen in our office on 05/04/20 and given Lamisil cream. He states his rash is slightly better, but still there.    Review of Systems  All other systems reviewed and are negative.      Objective:   Physical Exam Vitals reviewed.  Constitutional:      General: He is not in acute distress.    Appearance: He is well-developed and well-nourished.  HENT:     Head: Normocephalic.     Mouth/Throat:     Mouth: Oropharynx is clear and moist.     Comments: Oral ulcer in right cheek, lip, and roof of mouth Eyes:     General:        Right eye: No discharge.        Left eye: No discharge.     Pupils: Pupils are equal, round, and reactive to light.  Neck:     Thyroid: No thyromegaly.  Cardiovascular:     Rate and Rhythm: Normal rate and regular rhythm.     Pulses: Intact distal pulses.     Heart sounds: Normal heart sounds. No murmur heard.   Pulmonary:     Effort: Pulmonary effort is normal. No respiratory distress.     Breath sounds: Normal breath sounds. No wheezing.  Abdominal:     General: Bowel sounds are normal. There is no distension.     Palpations: Abdomen is soft.     Tenderness: There is no abdominal tenderness.  Musculoskeletal:        General: No tenderness or edema. Normal range of motion.     Cervical back: Normal range of motion and neck supple.  Skin:    General: Skin is warm and dry.     Findings: Rash (bilateral groin) present. No erythema.  Neurological:     Mental Status: He is alert and oriented to person,  place, and time.     Cranial Nerves: No cranial nerve deficit.     Deep Tendon Reflexes: Reflexes are normal and symmetric.  Psychiatric:        Mood and Affect: Mood and affect normal.        Behavior: Behavior normal.        Thought Content: Thought content normal.        Judgment: Judgment normal.       BP 129/80   Pulse 68   Temp (!) 97.4 F (36.3 C) (Temporal)   Ht 5\' 10"  (1.778 m)   Wt 244 lb 9.6 oz (110.9 kg)   BMI 35.10 kg/m      Assessment & Plan:  [name] comes in today with chief complaint of Oral Pain (Feels like it is raw )   Diagnosis and orders addressed:  1. Yeast infection of the skin Nystatin powder Keep clean and dry - nystatin (MYCOSTATIN/NYSTOP) powder; Apply 1 application topically 3 (three) times daily.  Dispense: 15  g; Refill: 0 - ketoconazole (NIZORAL) 2 % cream; Apply 1 application topically daily.  Dispense: 15 g; Refill: 0  2. Canker sore Avoid salty and spicy foods - triamcinolone (KENALOG) 0.1 % paste; Use as directed 1 application in the mouth or throat 2 (two) times daily.  Dispense: 5 g; Refill: [place], FNP

## 2020-05-31 NOTE — Patient Instructions (Signed)
Canker Sores  Canker sores are small, painful sores that develop inside your mouth. You can get one or more canker sores on the inside of your lips or cheeks, on your tongue, or anywhere inside your mouth. Canker sores cannot be passed from person to person (are not contagious). These sores are different from the sores that you may get on the outside of your lips (cold sores or fever blisters). What are the causes? The cause of this condition is not known. The condition may be passed down from a parent (genetic). What increases the risk? This condition is more likely to develop in:  Women.  People in their teens or 20s.  Women who are having their menstrual period.  People who are under a lot of emotional stress.  People who do not get enough iron or B vitamins.  People who do not take care of their mouth and teeth (have poor oral hygiene).  People who have an injury inside the mouth, such as after having dental work or from chewing something hard. What are the signs or symptoms? Canker sores usually start as painful red bumps. Then they turn into small white, yellow, or gray sores that have red borders. The sores may be painful, and the pain may get worse when you eat or drink. Along with the canker sore, symptoms may also include:  Fever.  Fatigue.  Swollen lymph nodes in your neck. How is this diagnosed? This condition may be diagnosed based on your symptoms and an exam of the inside of your mouth. If you get canker sores often or if they are very bad, you may have tests, such as:  Blood tests to rule out possible causes.  Swabbing a fluid sample from the sore to be tested for infection.  Removing a small tissue sample from the sore (biopsy) to test it for cancer. How is this treated? Most canker sores go away without treatment in about 1 week. Home care is usually the only treatment that you will need. Over-the-counter medicines can relieve discomfort. If you have severe  canker sores, your health care provider may prescribe:  Numbing ointment to relieve pain. ? Do not use numbing gels or products containing benzocaine in children who are 2 years of age or younger.  Vitamins.  Steroid medicines. These may be given as pills, mouth rinses, or gels.  Antibiotic mouth rinse. Follow these instructions at home:  Apply, take, or use over-the-counter and prescription medicines only as told by your health care provider. These include vitamins and ointments.  If you were prescribed an antibiotic mouth rinse, use it as told by your health care provider. Do not stop using the antibiotic even if your condition improves.  Until the sores are healed: ? Do not drink coffee or citrus juices. ? Do not eat spicy or salty foods.  Use a mild, over-the-counter mouth rinse as recommended by your health care provider.  Practice good oral hygiene by: ? Flossing your teeth every day. ? Brushing your teeth with a soft toothbrush twice each day.   Contact a health care provider if:  Your symptoms do not get better after 2 weeks.  You also have a fever or swollen glands in your neck.  You get canker sores often.  You have a canker sore that is getting larger.  You cannot eat or drink due to your canker sores. Summary  Canker sores are small, painful sores that develop inside your mouth.  Canker sores usually start as   painful red bumps that turn into small white, yellow, or gray sores that have red borders.  The sores may be quite painful, and the pain may get worse when you eat or drink.  Most canker sores clear up without treatment in about 1 week. Over-the-counter medicines can relieve discomfort. This information is not intended to replace advice given to you by your health care provider. Make sure you discuss any questions you have with your health care provider. Document Revised: 12/18/2019 Document Reviewed: 12/18/2019 Elsevier Patient Education  2021  Elsevier Inc.  

## 2020-06-09 ENCOUNTER — Inpatient Hospital Stay (HOSPITAL_COMMUNITY): Payer: Medicare Other

## 2020-06-09 ENCOUNTER — Inpatient Hospital Stay (HOSPITAL_COMMUNITY): Payer: Medicare Other | Attending: Hematology | Admitting: Hematology

## 2020-06-09 ENCOUNTER — Other Ambulatory Visit: Payer: Self-pay

## 2020-06-09 VITALS — BP 140/73 | HR 66 | Temp 97.0°F | Resp 18 | Wt 245.6 lb

## 2020-06-09 DIAGNOSIS — Z79899 Other long term (current) drug therapy: Secondary | ICD-10-CM | POA: Diagnosis not present

## 2020-06-09 DIAGNOSIS — D508 Other iron deficiency anemias: Secondary | ICD-10-CM | POA: Diagnosis not present

## 2020-06-09 DIAGNOSIS — D509 Iron deficiency anemia, unspecified: Secondary | ICD-10-CM | POA: Diagnosis not present

## 2020-06-09 DIAGNOSIS — M069 Rheumatoid arthritis, unspecified: Secondary | ICD-10-CM | POA: Diagnosis not present

## 2020-06-09 DIAGNOSIS — Z87891 Personal history of nicotine dependence: Secondary | ICD-10-CM | POA: Diagnosis not present

## 2020-06-09 DIAGNOSIS — E538 Deficiency of other specified B group vitamins: Secondary | ICD-10-CM | POA: Insufficient documentation

## 2020-06-09 LAB — IRON AND TIBC
Iron: 74 ug/dL (ref 45–182)
Saturation Ratios: 16 % — ABNORMAL LOW (ref 17.9–39.5)
TIBC: 452 ug/dL — ABNORMAL HIGH (ref 250–450)
UIBC: 378 ug/dL

## 2020-06-09 LAB — VITAMIN B12: Vitamin B-12: 219 pg/mL (ref 180–914)

## 2020-06-09 LAB — FERRITIN: Ferritin: 11 ng/mL — ABNORMAL LOW (ref 24–336)

## 2020-06-09 NOTE — Patient Instructions (Signed)
[place] at [facility] Discharge Instructions  You were seen today by Dr. Delton Coombes. He went over your recent results. You had labs drawn today to determine if you need an iron infusion. Dr. Delton Coombes will see you back in 6 months for labs and follow up.   Thank you for choosing [place] at [facility] to provide your oncology and hematology care.  To afford each patient quality time with our provider, please arrive at least 15 minutes before your scheduled appointment time.   If you have a lab appointment with the [facility] please come in thru the Main Entrance and check in at the main information desk  You need to re-schedule your appointment should you arrive 10 or more minutes late.  We strive to give you quality time with our providers, and arriving late affects you and other patients whose appointments are after yours.  Also, if you no show three or more times for appointments you may be dismissed from the clinic at the providers discretion.     Again, thank you for choosing [facility].  Our hope is that these requests will decrease the amount of time that you wait before being seen by our physicians.       _____________________________________________________________  Should you have questions after your visit to [facility], please contact our office at [phone] between the hours of 8:00 a.m. and 4:30 p.m.  Voicemails left after 4:00 p.m. will not be returned until the following business day.  For prescription refill requests, have your pharmacy contact our office and allow 72 hours.    Cancer Center Support Programs:   > Cancer Support Group  2nd Tuesday of the month 1pm-2pm, Journey Room

## 2020-06-09 NOTE — Progress Notes (Signed)
[place], [place] [address]   CLINIC:  Medical Oncology/Hematology  PCP:  [name], [place] / [place] [address]  [phone]  REASON FOR VISIT:  Follow-up for IDA  PRIOR THERAPY: None  CURRENT THERAPY: Intermittent Feraheme last on 01/09/2019  INTERVAL HISTORY:  [name], a 65 y.o. male, returns for routine follow-up for his IDA. [name] was last seen on 12/03/2019.  Today he reports feeling well. He is taking a multivitamin with iron daily and vitamin B12 inconsistently and denies having melena, hematochezia or hematuria. He denies being constipated while taking the multivitamin. His energy levels are good and his appetite is excellent. He is taking Orencia for his RA and will need to adjust it due to pain in his wrists and fingers; he has had RA for about 10 years.  He retired around the age of 52.   REVIEW OF SYSTEMS:  Review of Systems  Constitutional: Negative for appetite change and fatigue.  Gastrointestinal: Negative for blood in stool and constipation.  Genitourinary: Negative for hematuria.   Musculoskeletal: Positive for arthralgias (5/10 wrists and shoulders pain) and back pain (5/10 lower back pain).  All other systems reviewed and are negative.   PAST MEDICAL/SURGICAL HISTORY:  Past Medical History:  Diagnosis Date  . Anemia   . Arthritis    rheumatoid arthritis  . Diabetes mellitus   . Hyperlipidemia   . Hypertension   . Primary localized osteoarthritis of right knee 05/13/2015  . Scapho-lunate dissociation, right   . Vitamin D deficiency    Past Surgical History:  Procedure Laterality Date  . CARDIAC CATHETERIZATION  2005   normal  . CARPAL TUNNEL RELEASE  2009   both hands  . HARDWARE REMOVAL Right 11/14/2017   Procedure: REMOVAL ACCUTRAC SCREW RIGHT WRIST;  Surgeon: Daryll Brod, MD;  Location: [facility];  Service: Orthopedics;  Laterality: Right;  . KNEE  ARTHROSCOPY     rt  . PARTIAL KNEE ARTHROPLASTY Right 05/13/2015   Procedure: RIGHT UNI KNEE ARTHROPLASTY;  Surgeon: Marchia Bond, MD;  Location: [facility];  Service: Orthopedics;  Laterality: Right;  . scaphoid excision Left 2012   Dr Daylene Katayama  . TONSILLECTOMY    . WRIST FUSION Right 03/14/2017   Procedure: SCAPHOID EXCISION ULNAR FOUR BONE FUSION RIGHT;  Surgeon: Daryll Brod, MD;  Location: [facility];  Service: Orthopedics;  Laterality: Right;    SOCIAL HISTORY:  Social History   Socioeconomic History  . Marital status: Married    Spouse name: Not on file  . Number of children: 2  . Years of education: Not on file  . Highest education level: Not on file  Occupational History  . Occupation: Disabled  Tobacco Use  . Smoking status: Former Smoker    Quit date: 03/05/1991    Years since quitting: 29.2  . Smokeless tobacco: Never Used  Vaping Use  . Vaping Use: Never used  Substance and Sexual Activity  . Alcohol use: Yes    Comment: rare  . Drug use: No  . Sexual activity: Not on file  Other Topics Concern  . Not on file  Social History Narrative  . Not on file   Social Determinants of Health   Financial Resource Strain: Not on file  Food Insecurity: Not on file  Transportation Needs: Not on file  Physical Activity: Not on file  Stress: Not on file  Social Connections:  Not on file  Intimate Partner Violence: Not on file    FAMILY HISTORY:  Family History  Problem Relation Age of Onset  . Diabetes Father   . Hypertension Father   . Heart Problems Father   . Stroke Father   . Diabetes Maternal Grandmother   . Arthritis Sister   . Arthritis Sister   . Arthritis Daughter     CURRENT MEDICATIONS:  Current Outpatient Medications  Medication Sig Dispense Refill  . abatacept (ORENCIA) 250 MG injection 1000    . abatacept in sodium chloride 0.9 % Inject into the vein every 30 (thirty) days. Reported on 06/27/2015    . Acetaminophen  (TYLENOL ARTHRITIS PAIN PO)     . aspirin 81 MG chewable tablet 1 tablet    . clotrimazole (MYCELEX) 10 MG troche Take 1 tablet (10 mg total) by mouth 5 (five) times daily. 70 tablet 3  . glimepiride (AMARYL) 2 MG tablet Take 1 tablet (2 mg total) by mouth daily. 90 tablet 2  . hydrochlorothiazide (MICROZIDE) 12.5 MG capsule TAKE (1) CAPSULE DAILY 90 capsule 1  . ketoconazole (NIZORAL) 2 % cream Apply 1 application topically daily. 15 g 0  . losartan (COZAAR) 100 MG tablet Take 1 tablet (100 mg total) by mouth daily. 90 tablet 3  . meloxicam (MOBIC) 15 MG tablet TAKE 1 TABELT ONCE DAILY 30 tablet 2  . metFORMIN (GLUCOPHAGE) 1000 MG tablet TAKE 1 TABLET IN THE MORNING AND 1&1/2 TABLETS IN THE EVENING 225 tablet 1  . Multiple Vitamins-Minerals (MULTI FOR HIM) TABS See admin instructions.    [name] oxyCODONE-acetaminophen (PERCOCET) 10-325 MG tablet Take 1-2 tablets by mouth every 6 (six) hours as needed for pain. MAXIMUM TOTAL ACETAMINOPHEN DOSE IS 4000 MG PER DAY 50 tablet 0  . simvastatin (ZOCOR) 10 MG tablet Take 1 tablet (10 mg total) by mouth daily. 90 tablet 3  . nystatin (MYCOSTATIN/NYSTOP) powder Apply 1 application topically 3 (three) times daily. (Patient not taking: Reported on 06/09/2020) 15 g 0   No current facility-administered medications for this visit.    ALLERGIES:  Allergies  Allergen Reactions  . Other Shortness Of Breath, Swelling and Hives  . Shellfish Allergy Shortness Of Breath and Swelling  . Leflunomide     LFT ELEVATION  . Methotrexate     LFT elevation  . Canagliflozin Other (See Comments)    Causes bladder infections Causes bladder infections Causes bladder infections  . Cephalexin Other (See Comments)    Pt does not tol-nervous Pt does not tol-nervous Pt does not tol-nervous  . Doxycycline Nausea And Vomiting  . Dulaglutide Diarrhea and Nausea Only    PHYSICAL EXAM:  Performance status (ECOG): 0 - Asymptomatic  Vitals:   06/09/20 0808  BP: 140/73   Pulse: 66  Resp: 18  Temp: (!) 97 F (36.1 C)  SpO2: 98%   Wt Readings from Last 3 Encounters:  06/09/20 245 lb 9.6 oz (111.4 kg)  05/31/20 244 lb 9.6 oz (110.9 kg)  05/04/20 244 lb (110.7 kg)   Physical Exam Vitals reviewed.  Constitutional:      Appearance: Normal appearance. He is obese.  Cardiovascular:     Rate and Rhythm: Normal rate and regular rhythm.     Pulses: Normal pulses.     Heart sounds: Normal heart sounds.  Pulmonary:     Effort: Pulmonary effort is normal.     Breath sounds: Normal breath sounds.  Musculoskeletal:     Right lower leg: No edema.  Left lower leg: No edema.  Neurological:     General: No focal deficit present.     Mental Status: He is alert and oriented to person, place, and time.  Psychiatric:        Mood and Affect: Mood normal.        Behavior: Behavior normal.     LABORATORY DATA:  I have reviewed the labs as listed.  CBC Latest Ref Rng & Units 05/04/2020 02/26/2020 11/26/2019  WBC 3.4 - 10.8 x10E3/uL 8.2 12.0(H) 10.1  Hemoglobin 13.0 - 17.7 g/dL 13.7 14.6 14.9  Hematocrit 37.5 - 51.0 % 40.8 41.7 44.0  Platelets 150 - 450 x10E3/uL 267 322 278   CMP Latest Ref Rng & Units 02/26/2020 11/26/2019 08/25/2019  Glucose 65 - 99 mg/dL 118(H) 153(H) 125(H)  BUN 8 - 27 mg/dL 18 14 20   Creatinine 0.76 - 1.27 mg/dL 1.05 1.18 1.17  Sodium 134 - 144 mmol/L 140 138 140  Potassium 3.5 - 5.2 mmol/L 4.2 4.5 4.3  Chloride 96 - 106 mmol/L 102 101 100  CO2 20 - 29 mmol/L 20 23 25   Calcium 8.6 - 10.2 mg/dL 9.3 9.5 9.4  Total Protein 6.0 - 8.5 g/dL 7.0 6.8 7.1  Total Bilirubin 0.0 - 1.2 mg/dL 0.3 0.2 0.8  Alkaline Phos 44 - 121 IU/L 67 71 57  AST 0 - 40 IU/L 21 27 28   ALT 0 - 44 IU/L 20 28 26       Component Value Date/Time   RBC 4.59 05/04/2020 1127   RBC 4.56 08/25/2019 1036   MCV 89 05/04/2020 1127   MCH 29.8 05/04/2020 1127   MCH 30.5 08/25/2019 1036   MCHC 33.6 05/04/2020 1127   MCHC 33.0 08/25/2019 1036   RDW 11.4 (L) 05/04/2020  1127   LYMPHSABS 2.8 05/04/2020 1127   MONOABS 0.5 08/25/2019 1036   EOSABS 0.6 (H) 05/04/2020 1127   BASOSABS 0.1 05/04/2020 1127    DIAGNOSTIC IMAGING:  I have independently reviewed the scans and discussed with the patient. No results found.   ASSESSMENT:  1. 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 -He does not report any bleeding per rectum or melena. Energy levels are good. -We reviewed labs from 08/25/2019. Hemoglobin is 13.9. Ferritin has decreased to 16 from 101. Vitamin B12 was 184. -He was recommended to start taking iron tablet daily with stool softener. We will plan to repeat his labs in 3 months. -He wants to put off his screening colonoscopy until after COVID-19 is cleared completely.  2. B12 deficiency: -His B12 level is borderline at 184. I have recommended him to start taking B12 1 mg tablet daily.  3. Rheumatoid arthritis: -His arthritis involves small joints of the hands and right shoulder. He is on Orencia every 4 weeks.  4. Tobacco abuse: -He quit smoking in 1992. Smoked1 and1/2 pack/day for 20 years.   PLAN:  1. 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 -He is not taking iron tablet by itself daily. -Reviewed labs which showed hemoglobin 13.7, down from 14.6 in November. -Ferritin is also down to 11 with percent saturation of 16. -He was told to take iron tablet daily.  If he cannot tolerate, he will benefit from parenteral iron therapy. -Return to clinic in 6 months with repeat labs.  2. B12 deficiency: -He is not taking vitamin B12 consistently as he forgets.  B12 is 219.  He was  told to take B12 1 mg daily.  3. Rheumatoid arthritis: -He is continuing monthly Orencia.  Orders placed this encounter:  No orders of the defined types were placed in this encounter.    Cameron Jack, MD [place] [phone]   I, [name], am acting as a scribe for Dr. Sanda Linger.  I, Cameron Jack MD, have reviewed the above documentation for accuracy and completeness, and I agree with the above.

## 2020-06-10 ENCOUNTER — Encounter (HOSPITAL_COMMUNITY): Payer: Self-pay

## 2020-07-18 DIAGNOSIS — E1165 Type 2 diabetes mellitus with hyperglycemia: Secondary | ICD-10-CM | POA: Diagnosis not present

## 2020-07-18 DIAGNOSIS — E559 Vitamin D deficiency, unspecified: Secondary | ICD-10-CM | POA: Diagnosis not present

## 2020-08-09 DIAGNOSIS — M19011 Primary osteoarthritis, right shoulder: Secondary | ICD-10-CM | POA: Diagnosis present

## 2020-08-09 NOTE — Patient Instructions (Addendum)
DUE TO COVID-19 ONLY ONE VISITOR IS ALLOWED TO COME WITH YOU AND STAY IN THE WAITING ROOM ONLY DURING PRE OP AND PROCEDURE DAY OF SURGERY. THE 2 VISITORS  MAY VISIT WITH YOU AFTER SURGERY IN YOUR PRIVATE ROOM DURING VISITING HOURS ONLY!  YOU NEED TO HAVE A COVID 19 TEST ON___5/13____ @_10 :30______, THIS TEST MUST BE DONE BEFORE SURGERY,  COVID TESTING SITE [place] [id number], IT IS ON THE RIGHT GOING OUT [address] APPROXIMATELY  2 MINUTES PAST [employer] ON THE RIGHT. ONCE YOUR COVID TEST IS COMPLETED,  PLEASE BEGIN THE QUARANTINE INSTRUCTIONS AS OUTLINED IN YOUR HANDOUT.                [name]   Your procedure is scheduled on: 08/23/20   Report to [facility] Main  Entrance   Report to Short stay at 5:15 AM     Call this number if you have problems the morning of surgery [place], NO CHEWING GUM [place].   No food after midnight.    You may have clear liquid until 4:30 AM.    Coffee no cream is OK. Have at least 10 oz of liquid in the AM.   Nothing by mouth after 4:30 AM.    Take these medicines the morning of surgery with A SIP OF WATER: none. Your last dose of aspirin will be on 08/15/20.  How to Manage Your Diabetes Before and After Surgery  Why is it important to control my blood sugar before and after surgery? . Improving blood sugar levels before and after surgery helps healing and can limit problems. . A way of improving blood sugar control is eating a healthy diet by: o  Eating less sugar and carbohydrates o  Increasing activity/exercise o  Talking with your doctor about reaching your blood sugar goals . High blood sugars (greater than 180 mg/dL) can raise your risk of infections and slow your recovery, so you will need to focus on controlling your diabetes during the weeks before surgery. . Make sure that the doctor who takes care of  your diabetes knows about your planned surgery including the date and location.  How do I manage my blood sugar before surgery? . Check your blood sugar at least 4 times a day, starting 2 days before surgery, to make sure that the level is not too high or low. o Check your blood sugar the morning of your surgery when you wake up and every 2 hours until you get to the Short Stay unit. . If your blood sugar is less than 70 mg/dL, you will need to treat for low blood sugar: o Do not take insulin. o Treat a low blood sugar (less than 70 mg/dL) with  cup of clear juice (cranberry or apple), 4 glucose tablets, OR glucose gel. o Recheck blood sugar in 15 minutes after treatment (to make sure it is greater than 70 mg/dL). If your blood sugar is not greater than 70 mg/dL on recheck, call [phone] for further instructions. . Report your blood sugar to the short stay nurse when you get to Short Stay.  . If you are admitted to the hospital after surgery: o Your blood sugar will be checked by the staff and you will probably be given insulin after surgery (instead of oral diabetes medicines) to make sure you have good  blood sugar levels. o The goal for blood sugar control after surgery is 80-180 mg/dL.   WHAT DO I DO ABOUT MY DIABETES MEDICATION?  [name] Do not take oral diabetes medicines (pills) the morning of surgery.                                 You may not have any metal on your body including               piercings  Do not wear jewelry,  lotions, powders or deodorant              Men may shave face and neck.   Do not bring valuables to the hospital. [place].  Contacts, dentures or bridgework may not be worn into surgery.      Patients discharged the day of surgery will not be allowed to drive home.   IF YOU ARE HAVING SURGERY AND GOING HOME THE SAME DAY, YOU MUST HAVE AN ADULT TO DRIVE YOU HOME AND BE WITH YOU FOR 24 HOURS.   YOU MAY  GO HOME BY TAXI OR UBER OR ORTHERWISE, BUT AN ADULT MUST ACCOMPANY YOU HOME AND STAY WITH YOU FOR 24 HOURS.  Name and phone number of your driver:  Special Instructions: N/A              [name]- Preparing for Total Shoulder Arthroplasty    Before surgery, you can play an important role. Because skin is not sterile, your skin needs to be as free of germs as possible. You can reduce the number of germs on your skin by using the following products. . Benzoyl Peroxide Gel o Reduces the number of germs present on the skin o Applied twice a day to shoulder area starting two days before surgery    ==================================================================  Please follow these instructions carefully:  BENZOYL PEROXIDE 5% GEL  Please do not use if you have an allergy to benzoyl peroxide.   If your skin becomes reddened/irritated stop using the benzoyl peroxide.  Starting two days before surgery, apply as follows:  1. Apply benzoyl peroxide in the morning and at night. Apply after taking a shower. If you are not taking a shower clean entire shoulder front, back, and side along with the armpit with a clean wet washcloth.  2. Place a quarter-sized dollop on your shoulder and rub in thoroughly, making sure to cover the front, back, and side of your shoulder, along with the armpit.   5/15  2 days before ____ AM   ____ PM   &    5/16       1 day before ____ AM   ____ PM                         3. Do this twice a day for two days.  (Last application is the night before surgery, AFTER using the CHG soap as described below).  4. Do NOT apply benzoyl peroxide gel on the day of surgery.             [name] - Preparing for Surgery Before surgery, you can play an important role.  Because skin is not sterile, your skin needs to be as free of germs as possible.  You can  reduce the number of germs on your skin by washing with CHG (chlorahexidine gluconate) soap before surgery.  CHG is an  antiseptic cleaner which kills germs and bonds with the skin to continue killing germs even after washing. Please DO NOT use if you have an allergy to CHG or antibacterial soaps.  If your skin becomes reddened/irritated stop using the CHG and inform your nurse when you arrive at Short Stay.   You may shave your face/neck.  Please follow these instructions carefully:  1.  Shower with CHG Soap the night before surgery and the  morning of Surgery.  2.  If you choose to wash your hair, wash your hair first as usual with your  normal  shampoo.  3.  After you shampoo, rinse your hair and body thoroughly to remove the  shampoo.                                        4.  Use CHG as you would any other liquid soap.  You can apply chg directly  to the skin and wash                       Gently with a scrungie or clean washcloth.  5.  Apply the CHG Soap to your body ONLY FROM THE NECK DOWN.   Do not use on face/ open                           Wound or open sores. Avoid contact with eyes, ears mouth and genitals (private parts).                       Wash face,  Genitals (private parts) with your normal soap.             6.  Wash thoroughly, paying special attention to the area where your surgery  will be performed.  7.  Thoroughly rinse your body with warm water from the neck down.  8.  DO NOT shower/wash with your normal soap after using and rinsing off  the CHG Soap.             9.  Pat yourself dry with a clean towel.            10.  Wear clean pajamas.            11.  Place clean sheets on your bed the night of your first shower and do not  sleep with pets. Day of Surgery : Do not apply any lotions/deodorants the morning of surgery.  Please wear clean clothes to the hospital/surgery center.  FAILURE TO FOLLOW THESE INSTRUCTIONS MAY RESULT IN THE CANCELLATION OF YOUR SURGERY PATIENT SIGNATURE_________________________________  NURSE  SIGNATURE__________________________________  ________________________________________________________________________   Adam Phenix  An incentive spirometer is a tool that can help keep your lungs clear and active. This tool measures how well you are filling your lungs with each breath. Taking long deep breaths may help reverse or decrease the chance of developing breathing (pulmonary) problems (especially infection) following:  A long period of time when you are unable to move or be active. BEFORE THE PROCEDURE   If the spirometer includes an indicator to show your best effort, your nurse or respiratory therapist will set it to a desired goal.  If  possible, sit up straight or lean slightly forward. Try not to slouch.  Hold the incentive spirometer in an upright position. INSTRUCTIONS FOR USE  1. Sit on the edge of your bed if possible, or sit up as far as you can in bed or on a chair. 2. Hold the incentive spirometer in an upright position. 3. Breathe out normally. 4. Place the mouthpiece in your mouth and seal your lips tightly around it. 5. Breathe in slowly and as deeply as possible, raising the piston or the ball toward the top of the column. 6. Hold your breath for 3-5 seconds or for as long as possible. Allow the piston or ball to fall to the bottom of the column. 7. Remove the mouthpiece from your mouth and breathe out normally. 8. Rest for a few seconds and repeat Steps 1 through 7 at least 10 times every 1-2 hours when you are awake. Take your time and take a few normal breaths between deep breaths. 9. The spirometer may include an indicator to show your best effort. Use the indicator as a goal to work toward during each repetition. 10. After each set of 10 deep breaths, practice coughing to be sure your lungs are clear. If you have an incision (the cut made at the time of surgery), support your incision when coughing by placing a pillow or rolled up towels firmly  against it. Once you are able to get out of bed, walk around indoors and cough well. You may stop using the incentive spirometer when instructed by your caregiver.  RISKS AND COMPLICATIONS  Take your time so you do not get dizzy or light-headed.  If you are in pain, you may need to take or ask for pain medication before doing incentive spirometry. It is harder to take a deep breath if you are having pain. AFTER USE  Rest and breathe slowly and easily.  It can be helpful to keep track of a log of your progress. Your caregiver can provide you with a simple table to help with this. If you are using the spirometer at home, follow these instructions: [place] IF:   You are having difficultly using the spirometer.  You have trouble using the spirometer as often as instructed.  Your pain medication is not giving enough relief while using the spirometer.  You develop fever of 100.5 F (38.1 C) or higher. SEEK IMMEDIATE MEDICAL CARE IF:   You cough up bloody sputum that had not been present before.  You develop fever of 102 F (38.9 C) or greater.  You develop worsening pain at or near the incision site. MAKE SURE YOU:   Understand these instructions.  Will watch your condition.  Will get help right away if you are not doing well or get worse. Document Released: 08/06/2006 Document Revised: 06/18/2011 Document Reviewed: 10/07/2006 [facility] Patient Information 2014 [place], [place].   ________________________________________________________________________

## 2020-08-10 ENCOUNTER — Encounter: Payer: Self-pay | Admitting: Family Medicine

## 2020-08-11 ENCOUNTER — Other Ambulatory Visit: Payer: Self-pay

## 2020-08-11 ENCOUNTER — Encounter (HOSPITAL_COMMUNITY)
Admission: RE | Admit: 2020-08-11 | Discharge: 2020-08-11 | Disposition: A | Discharge status: Home / Self Care | Payer: Medicare Other | Source: Ambulatory Visit | Attending: Orthopedic Surgery | Admitting: Orthopedic Surgery

## 2020-08-11 ENCOUNTER — Encounter (HOSPITAL_COMMUNITY): Payer: Self-pay

## 2020-08-11 ENCOUNTER — Encounter (HOSPITAL_COMMUNITY): Admission: RE | Admit: 2020-08-11 | Payer: Medicare Other | Source: Ambulatory Visit

## 2020-08-11 DIAGNOSIS — Z01812 Encounter for preprocedural laboratory examination: Secondary | ICD-10-CM | POA: Diagnosis not present

## 2020-08-11 HISTORY — DX: Cardiac murmur, unspecified: R01.1

## 2020-08-11 LAB — HEMOGLOBIN A1C
Hgb A1c MFr Bld: 7.8 % — ABNORMAL HIGH (ref 4.8–5.6)
Mean Plasma Glucose: 177.16 mg/dL

## 2020-08-11 LAB — CBC
HCT: 42 % (ref 39.0–52.0)
Hemoglobin: 13.6 g/dL (ref 13.0–17.0)
MCH: 28.9 pg (ref 26.0–34.0)
MCHC: 32.4 g/dL (ref 30.0–36.0)
MCV: 89.2 fL (ref 80.0–100.0)
Platelets: 270 10*3/uL (ref 150–400)
RBC: 4.71 MIL/uL (ref 4.22–5.81)
RDW: 12.9 % (ref 11.5–15.5)
WBC: 9.5 10*3/uL (ref 4.0–10.5)
nRBC: 0 % (ref 0.0–0.2)

## 2020-08-11 LAB — BASIC METABOLIC PANEL
Anion gap: 10 (ref 5–15)
BUN: 14 mg/dL (ref 8–23)
CO2: 27 mmol/L (ref 22–32)
Calcium: 9.4 mg/dL (ref 8.9–10.3)
Chloride: 104 mmol/L (ref 98–111)
Creatinine, Ser: 0.93 mg/dL (ref 0.61–1.24)
GFR, Estimated: >60 mL/min (ref >60)
Glucose, Bld: 197 mg/dL — ABNORMAL HIGH (ref 70–99)
Potassium: 4.8 mmol/L (ref 3.5–5.1)
Sodium: 141 mmol/L (ref 135–145)

## 2020-08-11 LAB — SURGICAL PCR SCREEN
MRSA, PCR: NEGATIVE
Staphylococcus aureus: POSITIVE — AB

## 2020-08-11 LAB — GLUCOSE, CAPILLARY: Glucose-Capillary: 204 mg/dL — ABNORMAL HIGH (ref 70–99)

## 2020-08-11 NOTE — Progress Notes (Signed)
COVID Vaccine Completed:Yes Date COVID Vaccine completed:Pt can't remember COVID vaccine manufacturer:    [employer]   PCP - Evelina Dun FNP Cardiologist - Dr. Edmonia James  Chest x-ray - no EKG - 09/14/19-epic Stress Test - 03/28/16-epic ECHO - no Cardiac Cath - 2005-epic Pacemaker/ICD device last checked:NA  Sleep Study - yes-negative CPAP - no  Fasting Blood Sugar - 127-142 Checks Blood Sugar _____ times a day-2-3 times a week  Blood Thinner Instructions:ASA 81/ Dr. Johnsie Cancel Aspirin Instructions:Stop 7 days prior to DOS/ Dr. [name] Last Dose:08/15/20  Anesthesia review:   Patient denies shortness of breath, fever, cough and chest pain at PAT appointment Yes. Pt reports no SOB with any activities  Patient verbalized understanding of instructions that were given to them at the PAT appointment. Patient was also instructed that they will need to review over the PAT instructions again at home before surgery.Yes

## 2020-08-19 ENCOUNTER — Other Ambulatory Visit (HOSPITAL_COMMUNITY)
Admission: RE | Admit: 2020-08-19 | Discharge: 2020-08-19 | Disposition: A | Discharge status: Home / Self Care | Payer: Medicare Other | Source: Ambulatory Visit | Attending: Orthopedic Surgery | Admitting: Orthopedic Surgery

## 2020-08-19 DIAGNOSIS — Z20822 Contact with and (suspected) exposure to covid-19: Secondary | ICD-10-CM | POA: Diagnosis not present

## 2020-08-19 DIAGNOSIS — Z01812 Encounter for preprocedural laboratory examination: Secondary | ICD-10-CM | POA: Insufficient documentation

## 2020-08-20 LAB — SARS CORONAVIRUS 2 (TAT 6-24 HRS): SARS Coronavirus 2: NEGATIVE

## 2020-08-22 NOTE — Anesthesia Preprocedure Evaluation (Addendum)
Anesthesia Evaluation  Patient identified by MRN, date of birth, ID band Patient awake    Reviewed: Allergy & Precautions, NPO status , Patient's Chart, lab work & pertinent test results  History of Anesthesia Complications Negative for: history of anesthetic complications  Airway Mallampati: III  TM Distance: >3 FB Neck ROM: Full    Dental  (+) Dental Advisory Given, Upper Dentures, Lower Dentures   Pulmonary former smoker,    Pulmonary exam normal        Cardiovascular hypertension, Pt. on medications Normal cardiovascular exam   '17 Myoperfusion - Normal exercise nuclear stress test with no evidence of prior infarct or ischemia.  Decreased exercise capacity. Hypertensive response to stress (217/54 mmHg). Calculated LVEF is 51% but appears higher visually. A correlation with an echocardiogram is recommended.      Neuro/Psych negative neurological ROS  negative psych ROS   GI/Hepatic negative GI ROS, Neg liver ROS,   Endo/Other  diabetes, Type 2, Oral Hypoglycemic Agents Obesity   Renal/GU negative Renal ROS     Musculoskeletal  (+) Arthritis , Rheumatoid disorders,    Abdominal   Peds  Hematology negative hematology ROS (+)   Anesthesia Other Findings Covid test negative   Reproductive/Obstetrics                            Anesthesia Physical Anesthesia Plan  ASA: II  Anesthesia Plan: General   Post-op Pain Management:  Regional for Post-op pain   Induction: Intravenous  PONV Risk Score and Plan: 2 and Treatment may vary due to age or medical condition, Ondansetron and Dexamethasone  Airway Management Planned: Oral ETT  Additional Equipment: None  Intra-op Plan:   Post-operative Plan: Extubation in OR  Informed Consent: I have reviewed the patients History and Physical, chart, labs and discussed the procedure including the risks, benefits and alternatives for the  proposed anesthesia with the patient or authorized representative who has indicated his/her understanding and acceptance.     Dental advisory given  Plan Discussed with: CRNA and Anesthesiologist  Anesthesia Plan Comments: (Lengthy discussion had about patient's dentures. He refused to remove them preop. Discussed about possibilities of damage to his dentures during intubation and potential difficulties with intubation that may necessitate [place] removing the dentures in the operating suite. Patient expressed understanding and remained adamant that he would not remove his dentures preoperatively. )       Anesthesia Quick Evaluation

## 2020-08-22 NOTE — H&P (Signed)
SHOULDER ARTHROPLASTY ADMISSION H&P  Patient ID: [name] MRN: [id number] DOB/AGE: 12-31-1955 65 y.o.  Chief Complaint: right shoulder pain.  Planned Procedure Date: 08/23/20  Medical Clearance by Dr. Trudie Reed Cardiac Clearance by Dr. Johnsie Cancel Additional clearance by Dr. Chalmers Cater (Endocrine), Dr. Trudie Reed (rheumatology), Dr. Delton Coombes (hem/onc)  HPI: [name] is a 65 y.o. male who presents for evaluation of DJD RIGHT SHOULDER. The patient has a history of pain and functional disability in the right shoulder due to arthritis and has failed non-surgical conservative treatments for greater than 12 weeks to include NSAID's and/or analgesics, corticosteriod injections and activity modification.  Onset of symptoms was gradual, starting 6 years ago with gradually worsening course since that time. The patient noted no past surgery on the right shoulder.  Patient currently rates pain at 4 out of 10 with activity. Patient has night pain, worsening of pain with activity and weight bearing and pain that interferes with activities of daily living.  Patient has evidence of joint space narrowing by imaging studies.  There is no active infection.  Past Medical History:  Diagnosis Date  . Anemia    taking iron  . Arthritis    rheumatoid arthritis  . Diabetes mellitus   . Heart murmur   . Hyperlipidemia   . Hypertension   . Primary localized osteoarthritis of right knee 05/13/2015  . Scapho-lunate dissociation, right   . Vitamin D deficiency    Past Surgical History:  Procedure Laterality Date  . CARDIAC CATHETERIZATION  2005   normal  . CARPAL TUNNEL RELEASE  2009   both hands  . HARDWARE REMOVAL Right 11/14/2017   Procedure: REMOVAL ACCUTRAC SCREW RIGHT WRIST;  Surgeon: Daryll Brod, MD;  Location: [place];  Service: Orthopedics;  Laterality: Right;  . KNEE ARTHROSCOPY     rt  . PARTIAL KNEE ARTHROPLASTY Right 05/13/2015   Procedure: RIGHT UNI KNEE ARTHROPLASTY;  Surgeon:  Marchia Bond, MD;  Location: [place];  Service: Orthopedics;  Laterality: Right;  . scaphoid excision Left 2012   Dr Daylene Katayama  . TONSILLECTOMY     as a child  . WRIST FUSION Right 03/14/2017   Procedure: SCAPHOID EXCISION ULNAR FOUR BONE FUSION RIGHT;  Surgeon: Daryll Brod, MD;  Location: [place];  Service: Orthopedics;  Laterality: Right;   Allergies  Allergen Reactions  . Shellfish Allergy Shortness Of Breath and Swelling  . Leflunomide     LFT ELEVATION  . Methotrexate     LFT elevation  . Canagliflozin Other (See Comments)    Causes bladder infections   . Cephalexin Other (See Comments)    nervous   . Doxycycline Nausea And Vomiting  . Dulaglutide Diarrhea and Nausea Only   Prior to Admission medications   Medication Sig Start Date End Date Taking? Authorizing Provider  Abatacept (ORENCIA IV) Inject 1 Dose into the vein every 28 (twenty-eight) days.   Yes [provider]  acetaminophen (TYLENOL) 500 MG tablet Take 1,000 mg by mouth every 6 (six) hours as needed for moderate pain or headache.   Yes [provider]  aspirin 81 MG chewable tablet Chew 81 mg by mouth daily.   Yes [provider]  glimepiride (AMARYL) 2 MG tablet Take 1 tablet (2 mg total) by mouth daily. 02/26/20  Yes Hawks, Christy A, FNP  hydrochlorothiazide (MICROZIDE) 12.5 MG capsule TAKE (1) CAPSULE DAILY Patient taking differently: Take 12.5 mg by mouth daily. 02/26/20  Yes Sharion Balloon, FNP  losartan (COZAAR) 100 MG tablet Take 1 tablet (100 mg total) by mouth daily. 02/26/20  Yes Hawks, Christy A, FNP  meloxicam (MOBIC) 15 MG tablet TAKE 1 TABELT ONCE DAILY Patient taking differently: Take 15 mg by mouth daily. 12/04/19  Yes Hawks, Christy A, FNP  metFORMIN (GLUCOPHAGE) 1000 MG tablet TAKE 1 TABLET IN THE MORNING AND 1&1/2 TABLETS IN THE EVENING Patient taking differently: Take 1,000-1,500 mg by mouth See admin instructions. Take 1000 mg in  the morning and 1500 mg in the evening 02/26/20  Yes Hawks, [place] A, FNP  Multiple Vitamins-Minerals (MULTI FOR HIM) TABS Take 1 tablet by mouth daily.   Yes [provider]  naloxone (NARCAN) nasal spray 4 mg/0.1 mL Place 1 spray into the nose as needed (opioid overdose).   Yes [provider]  oxyCODONE-acetaminophen (PERCOCET) 10-325 MG tablet Take 1-2 tablets by mouth every 6 (six) hours as needed for pain. MAXIMUM TOTAL ACETAMINOPHEN DOSE IS 4000 MG PER DAY Patient taking differently: Take 1 tablet by mouth 2 (two) times daily. 05/13/15  Yes Marchia Bond, MD  simvastatin (ZOCOR) 10 MG tablet Take 1 tablet (10 mg total) by mouth daily. 02/26/20  Yes Hawks, Christy A, FNP  sodium chloride (OCEAN) 0.65 % SOLN nasal spray Place 1 spray into both nostrils as needed for congestion.   Yes [provider]  clotrimazole (MYCELEX) 10 MG troche Take 1 tablet (10 mg total) by mouth 5 (five) times daily. Patient not taking: No sig reported 05/31/20   Evelina Dun A, FNP  ketoconazole (NIZORAL) 2 % cream Apply 1 application topically daily. Patient not taking: No sig reported 05/31/20   Evelina Dun A, FNP  nystatin (MYCOSTATIN/NYSTOP) powder Apply 1 application topically 3 (three) times daily. Patient not taking: No sig reported 05/31/20   Sharion Balloon, FNP   Social History   Socioeconomic History  . Marital status: Married    Spouse name: Not on file  . Number of children: 2  . Years of education: Not on file  . Highest education level: Not on file  Occupational History  . Occupation: Disabled  Tobacco Use  . Smoking status: Former Smoker    Quit date: 03/05/1991    Years since quitting: 29.4  . Smokeless tobacco: Never Used  Vaping Use  . Vaping Use: Never used  Substance and Sexual Activity  . Alcohol use: Yes    Comment: rare  . Drug use: No  . Sexual activity: Not on file  Other Topics Concern  . Not on file  Social History Narrative  . Not on  file   Social Determinants of Health   Financial Resource Strain: Not on file  Food Insecurity: Not on file  Transportation Needs: Not on file  Physical Activity: Not on file  Stress: Not on file  Social Connections: Not on file   Family History  Problem Relation Age of Onset  . Diabetes Father   . Hypertension Father   . Heart Problems Father   . Stroke Father   . Diabetes Maternal Grandmother   . Arthritis Sister   . Arthritis Sister   . Arthritis Daughter     ROS: Currently denies lightheadedness, dizziness, Fever, chills, CP, SOB.   No personal history of DVT, PE, MI, or CVA. No loose teeth. Patient has removable dentures. All other systems have been reviewed and were otherwise currently negative with the exception of those mentioned in the HPI and as above.  Objective: Vitals: Ht: 5\' 7"   Wt: 244 lbs Temp: 97.9 BP: 139/78 Pulse: 71 O2 97.9% on room air.   Physical Exam: General: Alert, NAD.   HEENT: EOMI, Good Neck Extension  Pulm: No increased work of breathing.  Clear B/L A/P w/o crackle or wheeze.  CV: RRR, No m/g/r appreciated  GI: soft, NT, ND Neuro: Neuro without gross focal deficit.  Sensation intact distally Skin: No lesions in the area of chief complaint MSK/Surgical Site: right shoulder pain with range of motion.  Forward flexion/abduction approximately 0-90.  Internal rotation to R mid thigh.  External rotation to 15.  No AC pain.   No Biceps pain.  Intact Rotator cuff strength.  NVI distally.  Imaging Review CT demonstrates severe degenerative joint disease of the right shoulder.    Assessment: DJD RIGHT SHOULDER Principal Problem:   Osteoarthritis of right shoulder   Plan: Plan for Procedure(s): TOTAL SHOULDER ARTHROPLASTY  The patient history, physical exam, clinical judgement of the provider and imaging are consistent with end stage degenerative joint disease and total joint arthroplasty is deemed medically necessary. The treatment options  including medical management, injection therapy, and arthroplasty were discussed at length. The risks and benefits of Procedure(s): TOTAL SHOULDER ARTHROPLASTY were presented and reviewed.  The risks of nonoperative treatment, versus surgical intervention including but not limited to continued pain, aseptic loosening, stiffness, dislocation/subluxation, infection, bleeding, nerve injury, blood clots, cardiopulmonary complications, morbidity, mortality, among others were discussed. The patient verbalizes understanding and wishes to proceed with the plan.  Patient is being admitted for surgery, OT, pain control, prophylactic antibiotics, VTE prophylaxis, progressive ambulation, ADL's and discharge planning.   Dental prophylaxis discussed and recommended for 2 years postoperatively.   The patient does meet the criteria for TXA which will be used perioperatively.    Will use SCDs, and early ambulation for DVT prophylaxis.  The patient is planning to be discharged home care of  wife    Jola Baptist 08/22/2020 2:35 PM

## 2020-08-23 ENCOUNTER — Ambulatory Visit (HOSPITAL_COMMUNITY): Payer: Medicare Other

## 2020-08-23 ENCOUNTER — Ambulatory Visit (HOSPITAL_COMMUNITY)
Admission: RE | Admit: 2020-08-23 | Discharge: 2020-08-23 | Disposition: A | Discharge status: Home / Self Care | Payer: Medicare Other | Attending: Orthopedic Surgery | Admitting: Orthopedic Surgery

## 2020-08-23 ENCOUNTER — Encounter (HOSPITAL_COMMUNITY)
Admission: RE | Disposition: A | Discharge status: Home / Self Care | Payer: Self-pay | Source: Home / Self Care | Attending: Orthopedic Surgery

## 2020-08-23 ENCOUNTER — Ambulatory Visit (HOSPITAL_COMMUNITY): Payer: Medicare Other | Admitting: Physician Assistant

## 2020-08-23 ENCOUNTER — Encounter (HOSPITAL_COMMUNITY): Payer: Self-pay | Admitting: Orthopedic Surgery

## 2020-08-23 DIAGNOSIS — M19011 Primary osteoarthritis, right shoulder: Secondary | ICD-10-CM | POA: Diagnosis present

## 2020-08-23 DIAGNOSIS — Z881 Allergy status to other antibiotic agents status: Secondary | ICD-10-CM | POA: Diagnosis not present

## 2020-08-23 DIAGNOSIS — Z7984 Long term (current) use of oral hypoglycemic drugs: Secondary | ICD-10-CM | POA: Insufficient documentation

## 2020-08-23 DIAGNOSIS — Z791 Long term (current) use of non-steroidal anti-inflammatories (NSAID): Secondary | ICD-10-CM | POA: Insufficient documentation

## 2020-08-23 DIAGNOSIS — Z91013 Allergy to seafood: Secondary | ICD-10-CM | POA: Diagnosis not present

## 2020-08-23 DIAGNOSIS — Z79899 Other long term (current) drug therapy: Secondary | ICD-10-CM | POA: Diagnosis not present

## 2020-08-23 DIAGNOSIS — Z96651 Presence of right artificial knee joint: Secondary | ICD-10-CM | POA: Diagnosis not present

## 2020-08-23 DIAGNOSIS — Z96611 Presence of right artificial shoulder joint: Secondary | ICD-10-CM

## 2020-08-23 DIAGNOSIS — Z888 Allergy status to other drugs, medicaments and biological substances status: Secondary | ICD-10-CM | POA: Insufficient documentation

## 2020-08-23 DIAGNOSIS — M25711 Osteophyte, right shoulder: Secondary | ICD-10-CM | POA: Insufficient documentation

## 2020-08-23 DIAGNOSIS — Z7982 Long term (current) use of aspirin: Secondary | ICD-10-CM | POA: Insufficient documentation

## 2020-08-23 DIAGNOSIS — Z87891 Personal history of nicotine dependence: Secondary | ICD-10-CM | POA: Insufficient documentation

## 2020-08-23 HISTORY — PX: TOTAL SHOULDER ARTHROPLASTY: SHX126

## 2020-08-23 LAB — GLUCOSE, CAPILLARY
Glucose-Capillary: 139 mg/dL — ABNORMAL HIGH (ref 70–99)
Glucose-Capillary: 196 mg/dL — ABNORMAL HIGH (ref 70–99)

## 2020-08-23 LAB — COLOGUARD: Cologuard: POSITIVE — AB

## 2020-08-23 SURGERY — ARTHROPLASTY, SHOULDER, TOTAL
Anesthesia: General | Site: Shoulder | Laterality: Right

## 2020-08-23 MED ORDER — OXYCODONE HCL 5 MG PO TABS: 5.0000 mg | ORAL_TABLET | Freq: Once | ORAL | Status: DC | PRN

## 2020-08-23 MED ORDER — LIDOCAINE 2% (20 MG/ML) 5 ML SYRINGE
INTRAMUSCULAR | Status: DC | PRN
  Administered 2020-08-23: 80 mg via INTRAVENOUS

## 2020-08-23 MED ORDER — SUGAMMADEX SODIUM 200 MG/2ML IV SOLN
INTRAVENOUS | Status: DC | PRN
  Administered 2020-08-23: 200 mg via INTRAVENOUS

## 2020-08-23 MED ORDER — FENTANYL CITRATE (PF) 100 MCG/2ML IJ SOLN
INTRAMUSCULAR | Status: AC
  Filled 2020-08-23: qty 2

## 2020-08-23 MED ORDER — ONDANSETRON HCL 4 MG/2ML IJ SOLN
INTRAMUSCULAR | Status: DC | PRN
  Administered 2020-08-23: 4 mg via INTRAVENOUS

## 2020-08-23 MED ORDER — OXYCODONE-ACETAMINOPHEN 10-325 MG PO TABS: 1.0000 | ORAL_TABLET | Freq: Four times a day (QID) | ORAL | 0 refills | Status: DC | PRN

## 2020-08-23 MED ORDER — LABETALOL HCL 5 MG/ML IV SOLN: 5.0000 mg | INTRAVENOUS | Status: DC | PRN

## 2020-08-23 MED ORDER — ONDANSETRON HCL 4 MG PO TABS: 4.0000 mg | ORAL_TABLET | Freq: Three times a day (TID) | ORAL | 0 refills | Status: DC | PRN

## 2020-08-23 MED ORDER — DEXAMETHASONE SODIUM PHOSPHATE 10 MG/ML IJ SOLN
INTRAMUSCULAR | Status: AC
  Filled 2020-08-23: qty 1

## 2020-08-23 MED ORDER — ORAL CARE MOUTH RINSE
15.0000 mL | Freq: Once | OROMUCOSAL | Status: AC
Start: 2020-08-23 — End: 2020-08-23

## 2020-08-23 MED ORDER — ALBUTEROL SULFATE HFA 108 (90 BASE) MCG/ACT IN AERS
INHALATION_SPRAY | RESPIRATORY_TRACT | Status: DC | PRN
  Administered 2020-08-23: 4 via RESPIRATORY_TRACT

## 2020-08-23 MED ORDER — PROPOFOL 10 MG/ML IV BOLUS
INTRAVENOUS | Status: AC
  Filled 2020-08-23: qty 20

## 2020-08-23 MED ORDER — PHENYLEPHRINE HCL (PRESSORS) 10 MG/ML IV SOLN
INTRAVENOUS | Status: AC
  Filled 2020-08-23: qty 1

## 2020-08-23 MED ORDER — PROPOFOL 10 MG/ML IV BOLUS
INTRAVENOUS | Status: DC | PRN
  Administered 2020-08-23: 150 mg via INTRAVENOUS

## 2020-08-23 MED ORDER — TRANEXAMIC ACID-NACL 1000-0.7 MG/100ML-% IV SOLN
1000.0000 mg | INTRAVENOUS | Status: AC
  Administered 2020-08-23: 1000 mg via INTRAVENOUS
  Filled 2020-08-23: qty 100

## 2020-08-23 MED ORDER — PHENYLEPHRINE HCL-NACL 10-0.9 MG/250ML-% IV SOLN
INTRAVENOUS | Status: DC | PRN
  Administered 2020-08-23: 50 ug/min via INTRAVENOUS

## 2020-08-23 MED ORDER — MIDAZOLAM HCL 5 MG/5ML IJ SOLN
INTRAMUSCULAR | Status: DC | PRN
  Administered 2020-08-23: 2 mg via INTRAVENOUS

## 2020-08-23 MED ORDER — LACTATED RINGERS IV SOLN: INTRAVENOUS | Status: DC

## 2020-08-23 MED ORDER — SODIUM CHLORIDE 0.9 % IR SOLN
Status: DC | PRN
  Administered 2020-08-23 (×2): 1000 mL

## 2020-08-23 MED ORDER — ONDANSETRON HCL 4 MG/2ML IJ SOLN
INTRAMUSCULAR | Status: AC
  Filled 2020-08-23: qty 2

## 2020-08-23 MED ORDER — METHOCARBAMOL 500 MG PO TABS: 500.0000 mg | ORAL_TABLET | Freq: Four times a day (QID) | ORAL | Status: DC | PRN

## 2020-08-23 MED ORDER — LIDOCAINE 2% (20 MG/ML) 5 ML SYRINGE
INTRAMUSCULAR | Status: AC
  Filled 2020-08-23: qty 5

## 2020-08-23 MED ORDER — FENTANYL CITRATE (PF) 100 MCG/2ML IJ SOLN: 25.0000 ug | INTRAMUSCULAR | Status: DC | PRN

## 2020-08-23 MED ORDER — BACLOFEN 10 MG PO TABS: 10.0000 mg | ORAL_TABLET | Freq: Three times a day (TID) | ORAL | 0 refills | Status: DC

## 2020-08-23 MED ORDER — ACETAMINOPHEN 500 MG PO TABS
1000.0000 mg | ORAL_TABLET | Freq: Once | ORAL | Status: AC
  Administered 2020-08-23: 1000 mg via ORAL
  Filled 2020-08-23: qty 2

## 2020-08-23 MED ORDER — LABETALOL HCL 5 MG/ML IV SOLN
INTRAVENOUS | Status: AC
  Filled 2020-08-23: qty 4

## 2020-08-23 MED ORDER — BUPIVACAINE HCL (PF) 0.5 % IJ SOLN
INTRAMUSCULAR | Status: DC | PRN
  Administered 2020-08-23: 15 mL via PERINEURAL

## 2020-08-23 MED ORDER — TRANEXAMIC ACID-NACL 1000-0.7 MG/100ML-% IV SOLN: 1000.0000 mg | Freq: Once | INTRAVENOUS | Status: DC

## 2020-08-23 MED ORDER — METHOCARBAMOL 500 MG IVPB - SIMPLE MED: 500.0000 mg | Freq: Four times a day (QID) | INTRAVENOUS | Status: DC | PRN

## 2020-08-23 MED ORDER — LACTATED RINGERS IV BOLUS: 500.0000 mL | Freq: Once | INTRAVENOUS | Status: DC

## 2020-08-23 MED ORDER — ROCURONIUM BROMIDE 10 MG/ML (PF) SYRINGE
PREFILLED_SYRINGE | INTRAVENOUS | Status: DC | PRN
  Administered 2020-08-23: 20 mg via INTRAVENOUS
  Administered 2020-08-23: 50 mg via INTRAVENOUS

## 2020-08-23 MED ORDER — POVIDONE-IODINE 10 % EX SWAB: 2.0000 "application " | Freq: Once | CUTANEOUS | Status: DC

## 2020-08-23 MED ORDER — DEXAMETHASONE SODIUM PHOSPHATE 10 MG/ML IJ SOLN
INTRAMUSCULAR | Status: DC | PRN
  Administered 2020-08-23: 4 mg via INTRAVENOUS

## 2020-08-23 MED ORDER — SUCCINYLCHOLINE CHLORIDE 200 MG/10ML IV SOSY
PREFILLED_SYRINGE | INTRAVENOUS | Status: DC | PRN
  Administered 2020-08-23: 120 mg via INTRAVENOUS

## 2020-08-23 MED ORDER — MIDAZOLAM HCL 2 MG/2ML IJ SOLN
INTRAMUSCULAR | Status: AC
  Filled 2020-08-23: qty 2

## 2020-08-23 MED ORDER — OXYCODONE HCL 5 MG/5ML PO SOLN
5.0000 mg | Freq: Once | ORAL | Status: DC | PRN
Start: 2020-08-23 — End: 2020-08-23

## 2020-08-23 MED ORDER — ONDANSETRON HCL 4 MG/2ML IJ SOLN: 4.0000 mg | Freq: Once | INTRAMUSCULAR | Status: DC | PRN

## 2020-08-23 MED ORDER — CHLORHEXIDINE GLUCONATE 0.12 % MT SOLN
15.0000 mL | Freq: Once | OROMUCOSAL | Status: AC
  Administered 2020-08-23: 15 mL via OROMUCOSAL

## 2020-08-23 MED ORDER — CEFAZOLIN SODIUM-DEXTROSE 2-4 GM/100ML-% IV SOLN: 2.0000 g | Freq: Four times a day (QID) | INTRAVENOUS | Status: DC

## 2020-08-23 MED ORDER — CEFAZOLIN SODIUM-DEXTROSE 2-4 GM/100ML-% IV SOLN
2.0000 g | INTRAVENOUS | Status: AC
  Administered 2020-08-23: 2 g via INTRAVENOUS
  Filled 2020-08-23: qty 100

## 2020-08-23 MED ORDER — BUPIVACAINE LIPOSOME 1.3 % IJ SUSP
INTRAMUSCULAR | Status: DC | PRN
  Administered 2020-08-23: 10 mL via PERINEURAL

## 2020-08-23 MED ORDER — ROCURONIUM BROMIDE 10 MG/ML (PF) SYRINGE
PREFILLED_SYRINGE | INTRAVENOUS | Status: AC
  Filled 2020-08-23: qty 10

## 2020-08-23 MED ORDER — FENTANYL CITRATE (PF) 250 MCG/5ML IJ SOLN
INTRAMUSCULAR | Status: DC | PRN
  Administered 2020-08-23: 50 ug via INTRAVENOUS
  Administered 2020-08-23: 100 ug via INTRAVENOUS
  Administered 2020-08-23: 50 ug via INTRAVENOUS

## 2020-08-23 SURGICAL SUPPLY — 68 items
ADPR HD STD TPR HUM TI RVRS (Orthopedic Implant) ×1 IMPLANT
AID PSTN UNV HD RSTRNT DISP (MISCELLANEOUS)
BAG SPEC THK2 15X12 ZIP CLS (MISCELLANEOUS) ×1
BAG ZIPLOCK 12X15 (MISCELLANEOUS) ×2 IMPLANT
BIT DRILL QUICK REL 1/8 2PK SL (DRILL) ×1 IMPLANT
BLADE SAW SGTL 18X1.27X75 (BLADE) ×2 IMPLANT
CEMENT BONE R 1X40 (Cement) ×2 IMPLANT
CLSR STERI-STRIP ANTIMIC 1/2X4 (GAUZE/BANDAGES/DRESSINGS) ×2 IMPLANT
COOLER ICEMAN CLASSIC (MISCELLANEOUS) ×2 IMPLANT
COVER BACK TABLE 60X90IN (DRAPES) ×2 IMPLANT
COVER MAYO STAND STRL (DRAPES) ×2 IMPLANT
COVER SURGICAL LIGHT HANDLE (MISCELLANEOUS) ×2 IMPLANT
COVER WAND RF STERILE (DRAPES) IMPLANT
DECANTER SPIKE VIAL GLASS SM (MISCELLANEOUS) IMPLANT
DRAPE POUCH INSTRU U-SHP 10X18 (DRAPES) ×2 IMPLANT
DRAPE SHEET LG 3/4 BI-LAMINATE (DRAPES) ×4 IMPLANT
DRAPE SURG 17X11 SM STRL (DRAPES) ×2 IMPLANT
DRAPE U-SHAPE 47X51 STRL (DRAPES) ×2 IMPLANT
DRILL QUICK RELEASE 1/8 INCH (DRILL) ×2
DRSG MEPILEX BORDER 4X8 (GAUZE/BANDAGES/DRESSINGS) ×2 IMPLANT
DURAPREP 26ML APPLICATOR (WOUND CARE) ×2 IMPLANT
ELECT BLADE TIP CTD 4 INCH (ELECTRODE) ×2 IMPLANT
ELECT REM PT RETURN 15FT ADLT (MISCELLANEOUS) ×2 IMPLANT
GLENOID MOD PE 4 PEG SZ 4 (Shoulder) ×2 IMPLANT
GLENOID MOD POST TM SZ2 (Post) ×2 IMPLANT
GLOVE SRG 8 PF TXTR STRL LF DI (GLOVE) ×2 IMPLANT
GLOVE SURG ENC MOIS LTX SZ6.5 (GLOVE) ×2 IMPLANT
GLOVE SURG ENC MOIS LTX SZ7.5 (GLOVE) ×2 IMPLANT
GLOVE SURG ENC MOIS LTX SZ8 (GLOVE) ×2 IMPLANT
GLOVE SURG UNDER POLY LF SZ7 (GLOVE) ×2 IMPLANT
GLOVE SURG UNDER POLY LF SZ8 (GLOVE) ×4
GOWN STRL REUS W/TWL LRG LVL3 (GOWN DISPOSABLE) ×4 IMPLANT
HANDPIECE INTERPULSE COAX TIP (DISPOSABLE) ×2
HEAD HUMERAL BIPOLAR 42X18X46 (Orthopedic Implant) ×1 IMPLANT
HEAD HUMERAL COMP STD (Orthopedic Implant) ×1 IMPLANT
HOOD PEEL AWAY FLYTE STAYCOOL (MISCELLANEOUS) ×6 IMPLANT
HUMERAL HEAD BIPOLAR 42X18X46 (Orthopedic Implant) ×2 IMPLANT
HUMERAL HEAD COMP STD (Orthopedic Implant) ×2 IMPLANT
KIT BASIN OR (CUSTOM PROCEDURE TRAY) ×2 IMPLANT
KIT TURNOVER KIT A (KITS) ×2 IMPLANT
NEEDLE MAYO 6 CRC TAPER PT (NEEDLE) ×2 IMPLANT
NS IRRIG 1000ML POUR BTL (IV SOLUTION) ×2 IMPLANT
PACK SHOULDER (CUSTOM PROCEDURE TRAY) ×2 IMPLANT
PAD COLD SHLDR WRAP-ON (PAD) ×2 IMPLANT
PENCIL SMOKE EVACUATOR (MISCELLANEOUS) IMPLANT
PIN THREADED REVERSE (PIN) ×2 IMPLANT
PROTECTOR NERVE ULNAR (MISCELLANEOUS) ×2 IMPLANT
RESTRAINT HEAD UNIVERSAL NS (MISCELLANEOUS) IMPLANT
SET HNDPC FAN SPRY TIP SCT (DISPOSABLE) ×1 IMPLANT
SLING ARM FOAM STRAP LRG (SOFTGOODS) ×2 IMPLANT
SLING ARM IMMOBILIZER LRG (SOFTGOODS) ×2 IMPLANT
SMARTMIX MINI TOWER (MISCELLANEOUS) ×2
SPONGE LAP 18X18 RF (DISPOSABLE) ×2 IMPLANT
STEM HUMERAL STRL 12MMX14MM (Stem) ×2 IMPLANT
SUCTION FRAZIER HANDLE 12FR (TUBING) ×2
SUCTION TUBE FRAZIER 12FR DISP (TUBING) ×1 IMPLANT
SUPPORT WRAP ARM LG (MISCELLANEOUS) ×2 IMPLANT
SUT FIBERWIRE #2 38 REV NDL BL (SUTURE) ×8
SUT MAXBRAID #5 CCS-NDL 2PK (SUTURE) ×2 IMPLANT
SUT VIC AB 1 CT1 36 (SUTURE) ×2 IMPLANT
SUT VIC AB 2-0 CT1 27 (SUTURE) ×2
SUT VIC AB 2-0 CT1 TAPERPNT 27 (SUTURE) ×1 IMPLANT
SUT VIC AB 3-0 SH 8-18 (SUTURE) ×2 IMPLANT
SUTURE FIBERWR#2 38 REV NDL BL (SUTURE) ×4 IMPLANT
TOWEL OR 17X26 10 PK STRL BLUE (TOWEL DISPOSABLE) ×2 IMPLANT
TOWEL OR NON WOVEN STRL DISP B (DISPOSABLE) ×2 IMPLANT
TOWER SMARTMIX MINI (MISCELLANEOUS) ×1 IMPLANT
WATER STERILE IRR 1000ML POUR (IV SOLUTION) ×4 IMPLANT

## 2020-08-23 NOTE — Discharge Instructions (Signed)
Diet: As you were doing prior to hospitalization   Shower:  May shower but keep the wounds dry, use an occlusive plastic wrap, NO SOAKING IN TUB.  If the bandage gets wet, change with a clean dry gauze.   Dressing:  You may change your dressing 3-5 days after surgery if you notice drainage.There are sticky tapes (steri-strips) on your wounds and all the stitches are absorbable.  Leave the steri-strips in place when changing your dressings, they will peel off with time, usually 2-3 weeks.  Activity:  Increase activity slowly as tolerated, but follow the weight bearing instructions below.  The rules on driving is that you can not be taking narcotics while you drive, and you must feel in control of the vehicle.    Weight Bearing:  Non weight bearing right arm.   To prevent constipation: you may use a stool softener such as -  Colace (over the counter) 100 mg by mouth twice a day  Drink plenty of fluids (prune juice may be helpful) and high fiber foods Miralax (over the counter) for constipation as needed.    Itching:  If you experience itching with your medications, try taking only a single pain pill, or even half a pain pill at a time.  You may take up to 10 pain pills per day, and you can also use benadryl over the counter for itching or also to help with sleep.   Precautions:  If you experience chest pain or shortness of breath - call 911 immediately for transfer to the hospital emergency department!!  If you develop a fever greater that 101 F, purulent drainage from wound, increased redness or drainage from wound, or calf pain -- Call the office at [phone]                                                Follow- Up Appointment:  Please call for an appointment to be seen in 2 weeks [place] - [phone]    POST-OPERATIVE OPIOID TAPER INSTRUCTIONS: . It is important to wean off of your opioid medication as soon as possible. If you do not need pain medication after your surgery it is  ok to stop day one. [name] Opioids include: o Codeine, Hydrocodone(Norco, Vicodin), Oxycodone(Percocet, oxycontin) and hydromorphone amongst others.  . Long term and even short term use of opiods can cause: o Increased pain response o Dependence o Constipation o Depression o Respiratory depression o And more.  . Withdrawal symptoms can include o Flu like symptoms o Nausea, vomiting o And more . Techniques to manage these symptoms o Hydrate well o Eat regular healthy meals o Stay active o Use relaxation techniques(deep breathing, meditating, yoga) . Do Not substitute Alcohol to help with tapering . If you have been on opioids for less than two weeks and do not have pain than it is ok to stop all together.  . Plan to wean off of opioids o This plan should start within one week post op of your joint replacement. o Maintain the same interval or time between taking each dose and first decrease the dose.  o Cut the total daily intake of opioids by one tablet each day o Next start to increase the time between doses. o The last dose that should be eliminated is the evening dose.    Thank you for letting us be  a part of your medical care team.  It is a privilege we respect greatly.  We hope these instructions will help you stay on track for a fast and full recovery!

## 2020-08-23 NOTE — Evaluation (Signed)
Occupational Therapy Evaluation Patient Details Name: [name] MRN: [id number] DOB: 1956/03/07 Today's Date: 08/23/2020    History of Present Illness Patient is a 65 year old male s/p R total shoulder arthroplasty   Clinical Impression   Patient is a 65 year old male s/p shoulder replacement without functional use of right dominant upper extremity secondary to effects of surgery and interscalene block and shoulder precautions. Therapist provided education and instruction to patient and spouse in regards to exercises, precautions, positioning, donning upper extremity clothing and bathing while maintaining shoulder precautions, ice and edema management and donning/doffing sling. Patient and spouse verbalized understanding and demonstrated as needed. Patient needed assistance to donn shirt, underwear, pants and provided with instruction on compensatory strategies to perform ADLs. Patient to follow up with MD for further therapy needs.      Follow Up Recommendations  Follow surgeon's recommendation for DC plan and follow-up therapies    Equipment Recommendations  None recommended by OT       Precautions / Restrictions Precautions Precautions: Shoulder Type of Shoulder Precautions: Sling at all times except ADL/exercise,  AROM elbow, wrist and hand to tolerance ok,  PROM/AROM of shoulder No Shoulder Interventions: Shoulder sling/immobilizer;Off for dressing/bathing/exercises Precaution Booklet Issued: Yes (comment) Required Braces or Orthoses: Sling Restrictions Weight Bearing Restrictions: Yes RUE Weight Bearing: Non weight bearing      Mobility Bed Mobility               General bed mobility comments: in chair    Transfers Overall transfer level: Independent                    Balance Overall balance assessment: Independent                                         ADL either performed or assessed with clinical judgement   ADL Overall  ADL's : Needs assistance/impaired     Grooming: Independent   Upper Body Bathing: Minimal assistance;Sitting;Standing   Lower Body Bathing: Minimal assistance;Sit to/from stand;Sitting/lateral leans   Upper Body Dressing : Minimal assistance;Standing;Cueing for compensatory techniques Upper Body Dressing Details (indicate cue type and reason): assist to thread R UE due to nerve block Lower Body Dressing: Minimal assistance;Sitting/lateral leans;Sit to/from stand Lower Body Dressing Details (indicate cue type and reason): to pull up clothing over R hip Toilet Transfer: Independent   Toileting- Clothing Manipulation and Hygiene: Minimal assistance;Sit to/from stand       Functional mobility during ADLs: Independent General ADL Comments: patient and spouse educated in compensatory strategies for self care tasks in order to maintain shoulder precautions                  Pertinent Vitals/Pain Pain Assessment: Faces Faces Pain Scale: Hurts little more Pain Location: R shoulder Pain Descriptors / Indicators: Heaviness;Numbness Pain Intervention(s): Monitored during session     Hand Dominance Right   Extremity/Trunk Assessment Upper Extremity Assessment Upper Extremity Assessment: RUE deficits/detail RUE Deficits / Details: + nerve block RUE: Unable to fully assess due to immobilization   Lower Extremity Assessment Lower Extremity Assessment: Overall WFL for tasks assessed   Cervical / Trunk Assessment Cervical / Trunk Assessment: Normal   Communication Communication Communication: No difficulties   Cognition Arousal/Alertness: Awake/alert Behavior During Therapy: WFL for tasks assessed/performed Overall Cognitive Status: Within Functional Limits for tasks assessed  Exercises Exercises: Shoulder   Shoulder Instructions Shoulder Instructions Donning/doffing shirt without moving shoulder: Minimal  assistance;Caregiver independent with task;Patient able to independently direct caregiver Method for sponge bathing under operated UE: Minimal assistance;Caregiver independent with task;Patient able to independently direct caregiver Donning/doffing sling/immobilizer: Caregiver independent with task;Patient able to independently direct caregiver Correct positioning of sling/immobilizer: Caregiver independent with task;Patient able to independently direct caregiver Pendulum exercises (written home exercise program):  (N/A) ROM for elbow, wrist and digits of operated UE: Patient able to independently direct caregiver;Caregiver independent with task Sling wearing schedule (on at all times/off for ADL's): Caregiver independent with task;Patient able to independently direct caregiver Proper positioning of operated UE when showering: Caregiver independent with task;Patient able to independently direct caregiver Dressing change:  (N/A) Positioning of UE while sleeping: Patient able to independently direct caregiver;Caregiver independent with task    Home Living Family/patient expects to be discharged to:: Private residence Living Arrangements: Spouse/significant other Available Help at Discharge: Family Type of Home: House Home Access: Level entry     Home Layout: Two level;Able to live on main level with bedroom/bathroom     Bathroom Shower/Tub: Occupational psychologist: Handicapped height     Home Equipment: None          Prior Functioning/Environment Level of Independence: Independent                 OT Problem List: Pain;Impaired UE functional use;Obesity;Decreased knowledge of precautions         OT Goals(Current goals can be found in the care plan section) Acute Rehab OT Goals Patient Stated Goal: likes to swim OT Goal Formulation: All assessment and education complete, DC therapy   AM-PAC OT "6 Clicks" Daily Activity     Outcome Measure Help from another  person eating meals?: None Help from another person taking care of personal grooming?: None Help from another person toileting, which includes using toliet, bedpan, or urinal?: A Little Help from another person bathing (including washing, rinsing, drying)?: A Little Help from another person to put on and taking off regular upper body clothing?: A Little Help from another person to put on and taking off regular lower body clothing?: A Little 6 Click Score: 20   End of Session Equipment Utilized During Treatment: Other (comment) (sling) Nurse Communication: Other (comment) (OT complete)  Activity Tolerance: Patient tolerated treatment well Patient left: in chair;with call bell/phone within reach;with family/visitor present  OT Visit Diagnosis: Pain Pain - Right/Left: Right Pain - part of body: Shoulder                Time: 1478-2956 OT Time Calculation (min): 18 min Charges:  OT General Charges $OT Visit: 1 Visit OT Evaluation $OT Eval Low Complexity: 1 Low  Delbert Phenix OT OT pager: [place] 08/23/2020, 12:35 PM

## 2020-08-23 NOTE — Op Note (Signed)
08/23/2020  9:52 AM  PATIENT:  [name]    PRE-OPERATIVE DIAGNOSIS:  Right shoulder primary localized osteoarthritis  POST-OPERATIVE DIAGNOSIS:  Same  PROCEDURE:  Total Shoulder Arthroplasty  SURGEON:  Johnny Bridge, MD  PHYSICIAN ASSISTANT: Merlene Pulling, PA-C, present and scrubbed throughout the case, critical for completion in a timely fashion, and for retraction, instrumentation, and closure.  ANESTHESIA:   General with an interscalene block  ESTIMATED BLOOD LOSS: Right shoulder primary localized osteoarthritis  UNIQUE ASPECTS OF THE CASE: He had a moderate amount of osteophyte formation inferiorly, and the glenoid was fairly wide with some osteophyte on the rim.  I used a 4 glenoid, and had all 4 holes contained within the vault.  I was between 100 and 46 on the humeral head, but ultimately went with a 42 in order to minimize overhang, the 11 matched exactly, but I was concerned might end up a little bit too tight.  PREOPERATIVE INDICATIONS:  [name] is a  65 y.o. male who failed conservative measures and elected for surgical management.    The risks benefits and alternatives were discussed with the patient preoperatively including but not limited to the risks of infection, bleeding, nerve injury, cardiopulmonary complications, the need for revision surgery, dislocation, loosening, incomplete relief of pain, among others, and the patient was willing to proceed.   OPERATIVE IMPLANTS: Biomet size 12 micro press-fit humeral stem, size 42+18 versa-dial humeral head, set in the E position with increased coverage posteriorly, with a 4 cemented glenoid polyethylene 3 peg implant with a central regenerex noncemented post.   OPERATIVE FINDINGS: Advanced glenohumeral osteoarthritis involving the glenoid and the humeral head with substantial osteophyte formation inferiorly.   OPERATIVE PROCEDURE: The patient was brought to the operating room and placed in the supine position.  General anesthesia was administered. IV antibiotics were given.  The upper extremity was prepped and draped in usual sterile fashion. The patient was in a beachchair position with all bony prominences padded.   Time out was performed and a deltopectoral approach was carried out. The biceps tendon was tenodesed to the pectoralis tendon. The subscapularis was released, tagging it with a #2 FiberWire, leaving a cuff of tendon for repair.   The inferior osteophyte was removed, and release of the capsule off of the humeral side was completed. The head was dislocated, and I reamed sequentially. I placed the humeral cutting guide at 30 of retroversion, and then pinned this into place, and made my humeral neck cut. This was at the appropriate level.   I then placed deep retractors and exposed the glenoid. I excised the labrum circumferentially, taking care to protect the axillary nerve inferiorly.   I then placed a guidewire into the center position, controlling appropriate version and inclination. I then reamed over the guidewire with the small reamer, and was satisfied with the preparation. I preserved the subchondral bone in order to maximize the strength and minimize the risk for subsequent subsidence.   I then drilled the central hole for the regenerex peg, and then placed the guide, and then drilled the 3 peripheral peg holes. I had excellent bony circumferential contact.   I then cleaned the glenoid, irrigated it copiously, and then dried it and cemented the prosthesis into place. Excellent seating was achieved. I had full exposure. The cement cured while I turned my attention to the humeral side.   I sequentially broached, up to the selected size, with the broach set at 30 of retroversion. I  placed 3 #2 Fiberwire through the bone for subsequent repair.  I then placed the real stem. I trialed with multiple heads, and the above-named component was selected. Increased posterior coverage improved the  coverage. The soft tissue tension was appropriate.   I then impacted the real humeral head into place, reduced the head, and irrigated copiously. Excellent stability and range of motion was achieved. I repaired the subscapularis with a total of 5 #2 FiberWire; one for the interval, one for the corner, and then the remaining three from the lesser tuberosity which had already been passed.  Excellent repair achieved and I irrigated copiously once more. The subcutaneous tissue was closed with Vicryl including the deltopectoral fascia.   The skin was closed with Steri-Strips and sterile gauze was applied. He had a preoperative nerve block. He tolerated the procedure well and there were no complications.

## 2020-08-23 NOTE — Interval H&P Note (Signed)
History and Physical Interval Note:  08/23/2020 7:13 AM  [name]  has presented today for surgery, with the diagnosis of DJD RIGHT SHOULDER.  The various methods of treatment have been discussed with the patient and family. After consideration of risks, benefits and other options for treatment, the patient has consented to  Procedure(s): TOTAL SHOULDER ARTHROPLASTY (Right) as a surgical intervention.  The patient's history has been reviewed, patient examined, no change in status, stable for surgery.  I have reviewed the patient's chart and labs.  Questions were answered to the patient's satisfaction.     Johnny Bridge

## 2020-08-23 NOTE — Anesthesia Procedure Notes (Signed)
Procedure Name: Intubation Date/Time: 08/23/2020 7:39 AM Performed by: Mitzie Na, CRNA Pre-anesthesia Checklist: Patient identified, Emergency Drugs available, Suction available and Patient being monitored Patient Re-evaluated:Patient Re-evaluated prior to induction Oxygen Delivery Method: Circle system utilized Preoxygenation: Pre-oxygenation with 100% oxygen Induction Type: IV induction and Rapid sequence Laryngoscope Size: Mac and 4 Grade View: Grade I Tube type: Oral Tube size: 7.5 mm Number of attempts: 1 Airway Equipment and Method: Stylet Placement Confirmation: ETT inserted through vocal cords under direct vision,  positive ETCO2 and breath sounds checked- equal and bilateral Secured at: 25 cm Tube secured with: Tape Dental Injury: Teeth and Oropharynx as per pre-operative assessment

## 2020-08-23 NOTE — Transfer of Care (Signed)
Immediate Anesthesia Transfer of Care Note  Patient: [name]  Procedure(s) Performed: TOTAL SHOULDER ARTHROPLASTY (Right Shoulder)  Patient Location: PACU  Anesthesia Type:General  Level of Consciousness: awake, alert  and oriented  Airway & Oxygen Therapy: Patient Spontanous Breathing and Patient connected to face mask oxygen  Post-op Assessment: Report given to RN, Post -op Vital signs reviewed and stable and Patient moving all extremities  Post vital signs: Reviewed and stable  Last Vitals:  Vitals Value Taken Time  BP    Temp    Pulse 109 08/23/20 1018  Resp 20 08/23/20 1018  SpO2 95 % 08/23/20 1018  Vitals shown include unvalidated device data.  Last Pain:  Vitals:   08/23/20 0614  TempSrc:   PainSc: 8          Complications: No complications documented.

## 2020-08-23 NOTE — Anesthesia Postprocedure Evaluation (Signed)
Anesthesia Post Note  Patient: [name]  Procedure(s) Performed: TOTAL SHOULDER ARTHROPLASTY (Right Shoulder)     Patient location during evaluation: PACU Anesthesia Type: General Level of consciousness: awake and alert Pain management: pain level controlled Vital Signs Assessment: post-procedure vital signs reviewed and stable Respiratory status: spontaneous breathing, nonlabored ventilation, respiratory function stable and patient connected to nasal cannula oxygen Cardiovascular status: blood pressure returned to baseline and stable Postop Assessment: no apparent nausea or vomiting Anesthetic complications: no   No complications documented.  Last Vitals:  Vitals:   08/23/20 1128 08/23/20 1130  BP: (!) 167/84 (!) 167/84  Pulse:  85  Resp: 16   Temp: 36.4 C   SpO2: 91% 90%    Last Pain:  Vitals:   08/23/20 1128  TempSrc: Oral  PainSc:                  Audry Pili

## 2020-08-23 NOTE — Addendum Note (Signed)
Addendum  created 08/23/20 1213 by Mitzie Na, CRNA   Flowsheet accepted

## 2020-08-23 NOTE — Anesthesia Procedure Notes (Signed)
Anesthesia Regional Block: Interscalene brachial plexus block   Pre-Anesthetic Checklist: ,, timeout performed, Correct Patient, Correct Site, Correct Laterality, Correct Procedure, Correct Position, site marked, Risks and benefits discussed,  Surgical consent,  Pre-op evaluation,  At surgeon's request and post-op pain management  Laterality: Right  Prep: chloraprep       Needles:  Injection technique: Single-shot  Needle Type: Echogenic Needle     Needle Length: 5cm  Needle Gauge: 21     Additional Needles:   Narrative:  Start time: 08/23/2020 7:05 AM End time: 08/23/2020 7:09 AM Injection made incrementally with aspirations every 5 mL.  Performed by: Personally  Anesthesiologist: Audry Pili, MD  Additional Notes: No pain on injection. No increased resistance to injection. Injection made in 5cc increments. Good needle visualization. Patient tolerated the procedure well.

## 2020-08-25 ENCOUNTER — Encounter (HOSPITAL_COMMUNITY): Payer: Self-pay | Admitting: Orthopedic Surgery

## 2020-08-25 ENCOUNTER — Ambulatory Visit: Payer: Medicare Other | Admitting: Family

## 2020-08-29 ENCOUNTER — Encounter (HOSPITAL_COMMUNITY): Payer: Self-pay | Admitting: Hematology

## 2020-08-29 ENCOUNTER — Other Ambulatory Visit: Payer: Self-pay

## 2020-08-29 ENCOUNTER — Encounter: Payer: Self-pay | Admitting: Family

## 2020-08-29 ENCOUNTER — Ambulatory Visit (INDEPENDENT_AMBULATORY_CARE_PROVIDER_SITE_OTHER): Payer: Medicare Other | Admitting: Family

## 2020-08-29 VITALS — BP 128/79 | HR 70 | Temp 96.9°F | Ht 68.0 in | Wt 240.4 lb

## 2020-08-29 DIAGNOSIS — E1169 Type 2 diabetes mellitus with other specified complication: Secondary | ICD-10-CM

## 2020-08-29 DIAGNOSIS — E1159 Type 2 diabetes mellitus with other circulatory complications: Secondary | ICD-10-CM

## 2020-08-29 DIAGNOSIS — E785 Hyperlipidemia, unspecified: Secondary | ICD-10-CM | POA: Diagnosis not present

## 2020-08-29 DIAGNOSIS — Z96651 Presence of right artificial knee joint: Secondary | ICD-10-CM | POA: Diagnosis not present

## 2020-08-29 DIAGNOSIS — D509 Iron deficiency anemia, unspecified: Secondary | ICD-10-CM

## 2020-08-29 DIAGNOSIS — E1165 Type 2 diabetes mellitus with hyperglycemia: Secondary | ICD-10-CM | POA: Diagnosis not present

## 2020-08-29 DIAGNOSIS — M1711 Unilateral primary osteoarthritis, right knee: Secondary | ICD-10-CM | POA: Diagnosis not present

## 2020-08-29 DIAGNOSIS — I152 Hypertension secondary to endocrine disorders: Secondary | ICD-10-CM | POA: Diagnosis not present

## 2020-08-29 DIAGNOSIS — M059 Rheumatoid arthritis with rheumatoid factor, unspecified: Secondary | ICD-10-CM | POA: Diagnosis not present

## 2020-08-29 DIAGNOSIS — M19011 Primary osteoarthritis, right shoulder: Secondary | ICD-10-CM

## 2020-08-29 NOTE — Patient Instructions (Signed)
Diabetes Mellitus and Nutrition, Adult When you have diabetes, or diabetes mellitus, it is very important to have healthy eating habits because your blood sugar (glucose) levels are greatly affected by what you eat and drink. Eating healthy foods in the right amounts, at about the same times every day, can help you:  Control your blood glucose.  Lower your risk of heart disease.  Improve your blood pressure.  Reach or maintain a healthy weight. What can affect my meal plan? Every person with diabetes is different, and each person has different needs for a meal plan. Your health care provider may recommend that you work with a dietitian to make a meal plan that is best for you. Your meal plan may vary depending on factors such as:  The calories you need.  The medicines you take.  Your weight.  Your blood glucose, blood pressure, and cholesterol levels.  Your activity level.  Other health conditions you have, such as heart or kidney disease. How do carbohydrates affect me? Carbohydrates, also called carbs, affect your blood glucose level more than any other type of food. Eating carbs naturally raises the amount of glucose in your blood. Carb counting is a method for keeping track of how many carbs you eat. Counting carbs is important to keep your blood glucose at a healthy level, especially if you use insulin or take certain oral diabetes medicines. It is important to know how many carbs you can safely have in each meal. This is different for every person. Your dietitian can help you calculate how many carbs you should have at each meal and for each snack. How does alcohol affect me? Alcohol can cause a sudden decrease in blood glucose (hypoglycemia), especially if you use insulin or take certain oral diabetes medicines. Hypoglycemia can be a life-threatening condition. Symptoms of hypoglycemia, such as sleepiness, dizziness, and confusion, are similar to symptoms of having too much  alcohol.  Do not drink alcohol if: ? Your health care provider tells you not to drink. ? You are pregnant, may be pregnant, or are planning to become pregnant.  If you drink alcohol: ? Do not drink on an empty stomach. ? Limit how much you use to:  0-1 drink a day for women.  0-2 drinks a day for men. ? Be aware of how much alcohol is in your drink. In the [place] one drink equals one 12 oz bottle of beer (355 mL), one 5 oz glass of wine (148 mL), or one 1 oz glass of hard liquor (44 mL). ? Keep yourself hydrated with water, diet soda, or unsweetened iced tea.  Keep in mind that regular soda, juice, and other mixers may contain a lot of sugar and must be counted as carbs. What are tips for following this plan? Reading food labels  Start by checking the serving size on the "Nutrition Facts" label of packaged foods and drinks. The amount of calories, carbs, fats, and other nutrients listed on the label is based on one serving of the item. Many items contain more than one serving per package.  Check the total grams (g) of carbs in one serving. You can calculate the number of servings of carbs in one serving by dividing the total carbs by 15. For example, if a food has 30 g of total carbs per serving, it would be equal to 2 servings of carbs.  Check the number of grams (g) of saturated fats and trans fats in one serving. Choose foods that have   a low amount or none of these fats.  Check the number of milligrams (mg) of salt (sodium) in one serving. Most people should limit total sodium intake to less than 2,300 mg per day.  Always check the nutrition information of foods labeled as "low-fat" or "nonfat." These foods may be higher in added sugar or refined carbs and should be avoided.  Talk to your dietitian to identify your daily goals for nutrients listed on the label. Shopping  Avoid buying canned, pre-made, or processed foods. These foods tend to be high in fat, sodium, and added  sugar.  Shop around the outside edge of the grocery store. This is where you will most often find fresh fruits and vegetables, bulk grains, fresh meats, and fresh dairy. Cooking  Use low-heat cooking methods, such as baking, instead of high-heat cooking methods like deep frying.  Cook using healthy oils, such as olive, canola, or sunflower oil.  Avoid cooking with butter, cream, or high-fat meats. Meal planning  Eat meals and snacks regularly, preferably at the same times every day. Avoid going long periods of time without eating.  Eat foods that are high in fiber, such as fresh fruits, vegetables, beans, and whole grains. Talk with your dietitian about how many servings of carbs you can eat at each meal.  Eat 4-6 oz (112-168 g) of lean protein each day, such as lean meat, chicken, fish, eggs, or tofu. One ounce (oz) of lean protein is equal to: ? 1 oz (28 g) of meat, chicken, or fish. ? 1 egg. ?  cup (62 g) of tofu.  Eat some foods each day that contain healthy fats, such as avocado, nuts, seeds, and fish.   What foods should I eat? Fruits Berries. Apples. Oranges. Peaches. Apricots. Plums. Grapes. Mango. Papaya. Pomegranate. Kiwi. Cherries. Vegetables Lettuce. Spinach. Leafy greens, including kale, chard, collard greens, and mustard greens. Beets. Cauliflower. Cabbage. Broccoli. Carrots. Green beans. Tomatoes. Peppers. Onions. Cucumbers. Brussels sprouts. Grains Whole grains, such as whole-wheat or whole-grain bread, crackers, tortillas, cereal, and pasta. Unsweetened oatmeal. Quinoa. Brown or wild rice. Meats and other proteins Seafood. Poultry without skin. Lean cuts of poultry and beef. Tofu. Nuts. Seeds. Dairy Low-fat or fat-free dairy products such as milk, yogurt, and cheese. The items listed above may not be a complete list of foods and beverages you can eat. Contact a dietitian for more information. What foods should I avoid? Fruits Fruits canned with  syrup. Vegetables Canned vegetables. Frozen vegetables with butter or cream sauce. Grains Refined white flour and flour products such as bread, pasta, snack foods, and cereals. Avoid all processed foods. Meats and other proteins Fatty cuts of meat. Poultry with skin. Breaded or fried meats. Processed meat. Avoid saturated fats. Dairy Full-fat yogurt, cheese, or milk. Beverages Sweetened drinks, such as soda or iced tea. The items listed above may not be a complete list of foods and beverages you should avoid. Contact a dietitian for more information. Questions to ask a health care provider  Do I need to meet with a diabetes educator?  Do I need to meet with a dietitian?  What number can I call if I have questions?  When are the best times to check my blood glucose? Where to find more information:  American Diabetes Association: diabetes.org  Academy of Nutrition and Dietetics: www.eatright.org  National Institute of Diabetes and Digestive and Kidney Diseases: www.niddk.nih.gov  Association of Diabetes Care and Education Specialists: www.diabeteseducator.org Summary  It is important to have healthy eating   habits because your blood sugar (glucose) levels are greatly affected by what you eat and drink.  A healthy meal plan will help you control your blood glucose and maintain a healthy lifestyle.  Your health care provider may recommend that you work with a dietitian to make a meal plan that is best for you.  Keep in mind that carbohydrates (carbs) and alcohol have immediate effects on your blood glucose levels. It is important to count carbs and to use alcohol carefully. This information is not intended to replace advice given to you by your health care provider. Make sure you discuss any questions you have with your health care provider. Document Revised: 03/03/2019 Document Reviewed: 03/03/2019 Elsevier Patient Education  2021 Elsevier Inc.  

## 2020-08-29 NOTE — Progress Notes (Signed)
Subjective:    Patient ID: [name], male    DOB: 01-28-1956, 65 y.o.   MRN: [id number]  Chief Complaint  Patient presents with  . Diabetes    PTcallsto the office today forchronic follow up. Pt has rheumatoid arthritis and followed by rheumatologists every69months.Pt is followed by Cardiologists annually. He is followed by Hematologists for iron deficiency.  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 He is currently wearing a sling and states his pain is stable.  Diabetes He presents for his follow-up diabetic visit. He has type 2 diabetes mellitus. His disease course has been stable. There are no hypoglycemic associated symptoms. Pertinent negatives for diabetes include no blurred vision and no foot paresthesias. Symptoms are stable. Diabetic complications include heart disease. Risk factors for coronary artery disease include dyslipidemia, diabetes mellitus, male sex, hypertension and sedentary lifestyle. He is following a generally healthy diet. His overall blood glucose range is 130-140 mg/dl. An ACE inhibitor/angiotensin II receptor blocker is being taken. Eye exam is current.  Hypertension This is a chronic problem. The current episode started more than 1 year ago. The problem has been resolved since onset. The problem is controlled. Associated symptoms include malaise/fatigue. Pertinent negatives include no blurred vision, peripheral edema or shortness of breath. Risk factors for coronary artery disease include dyslipidemia, obesity, male gender and sedentary lifestyle. The current treatment provides moderate improvement.  Hyperlipidemia This is a chronic problem. The current episode started more than 1 year ago. The problem is controlled. Exacerbating diseases include obesity. Pertinent negatives include no shortness of breath. Current antihyperlipidemic treatment includes statins. The current treatment provides moderate improvement of lipids. Risk factors for coronary  artery disease include dyslipidemia, hypertension and a sedentary lifestyle.  Arthritis Presents for follow-up visit. He complains of pain and stiffness. The symptoms have been stable. Affected locations include the right MCP, left MCP, right knee, left knee, left shoulder and right shoulder. His pain is at a severity of 5/10.      Review of Systems  Constitutional: Positive for malaise/fatigue.  Eyes: Negative for blurred vision.  Respiratory: Negative for shortness of breath.   Musculoskeletal: Positive for arthritis and stiffness.  All other systems reviewed and are negative.      Objective:   Physical Exam Vitals reviewed.  Constitutional:      General: He is not in acute distress.    Appearance: He is well-developed.  HENT:     Head: Normocephalic.     Right Ear: Tympanic membrane normal.     Left Ear: Tympanic membrane normal.  Eyes:     General:        Right eye: No discharge.        Left eye: No discharge.     Pupils: Pupils are equal, round, and reactive to light.  Neck:     Thyroid: No thyromegaly.  Cardiovascular:     Rate and Rhythm: Normal rate and regular rhythm.     Heart sounds: Normal heart sounds. No murmur heard.   Pulmonary:     Effort: Pulmonary effort is normal. No respiratory distress.     Breath sounds: Normal breath sounds. No wheezing.  Abdominal:     General: Bowel sounds are normal. There is no distension.     Palpations: Abdomen is soft.     Tenderness: There is no abdominal tenderness.  Musculoskeletal:        General: Tenderness present.     Cervical back: Normal range of motion and neck supple.  Comments: Pain in left shoulder, shoulder in sling  Skin:    General: Skin is warm and dry.     Findings: No erythema or rash.  Neurological:     Mental Status: He is alert and oriented to person, place, and time.     Cranial Nerves: No cranial nerve deficit.     Deep Tendon Reflexes: Reflexes are normal and symmetric.  Psychiatric:         Behavior: Behavior normal.        Thought Content: Thought content normal.        Judgment: Judgment normal.       BP 128/79   Pulse 70   Temp (!) 96.9 F (36.1 C) (Temporal)   Ht 5\' 8"  (1.727 m)   Wt 240 lb 6.4 oz (109 kg)   BMI 36.55 kg/m      Assessment & Plan:  [name] comes in today with chief complaint of Diabetes   Diagnosis and orders addressed:  1. Hypertension associated with diabetes ([place])  2. Hyperlipidemia associated with type 2 diabetes mellitus ([place])  3. Type 2 diabetes mellitus with hyperglycemia, without long-term current use of insulin ([place])  4. Rheumatoid arthritis with positive rheumatoid factor, involving unspecified site ([place])  5. Primary localized osteoarthritis of right knee  6. Osteoarthritis of right shoulder, unspecified osteoarthritis type  7. Microcytic anemia  8. Morbid obesity ([place])  9. S/P right unicompartmental knee replacement    Labs reviewed from 08/11/20. Discussed elevated A1C and need for strict low carb diet. Health Maintenance reviewed Diet and exercise encouraged  Follow up plan: 3 months    Evelina Dun, FNP

## 2020-09-17 ENCOUNTER — Other Ambulatory Visit: Payer: Self-pay | Admitting: Family

## 2020-09-27 IMAGING — CT CT CARDIAC CORONARY ARTERY CALCIUM SCORE
3 series · 14 of 20 positions shown, 15 images · non-contrast
Comparison: None.
COMPARISON: None.

Addendum:
EXAM:
OVER-READ INTERPRETATION  CT CHEST

The following report is an over-read performed by radiologist Dr.
Tonza Sillsten [REDACTED] on 09/24/2019. This
over-read does not include interpretation of cardiac or coronary
anatomy or pathology. The coronary calcium score interpretation by
the cardiologist is attached.
CLINICAL DATA: Risk stratification
Coronary Calcium Score
TECHNIQUE: The patient was scanned on a Siemens Force scanner. Axial
non-contrast 3 mm slices were carried out through the heart. The
data set was analyzed on a dedicated work station and scored using
the Agatson method.

[Series 2: casc 3.0 bv41 2 bestdiast 70 % · axial · 0.44mm/px · z∈[-242,-170]mm · 4 of 42 slices shown, 5 images]
[im 9/42  vessel]
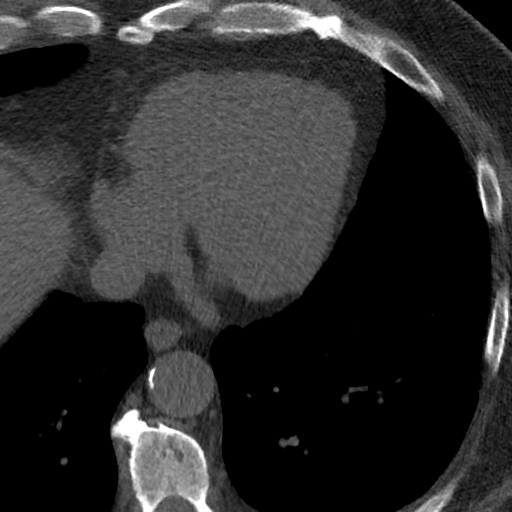
[im 9/42  lung]
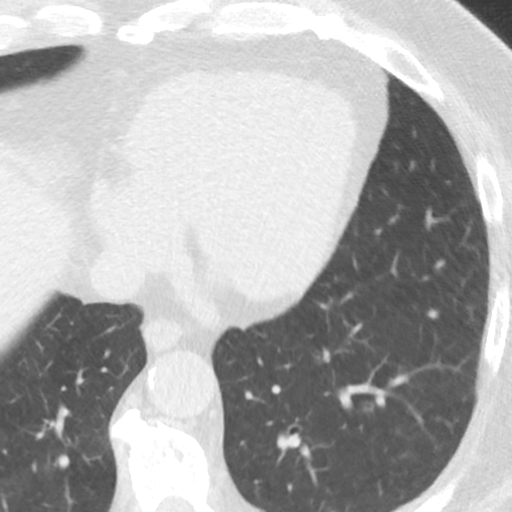
[im 17/42  vessel]
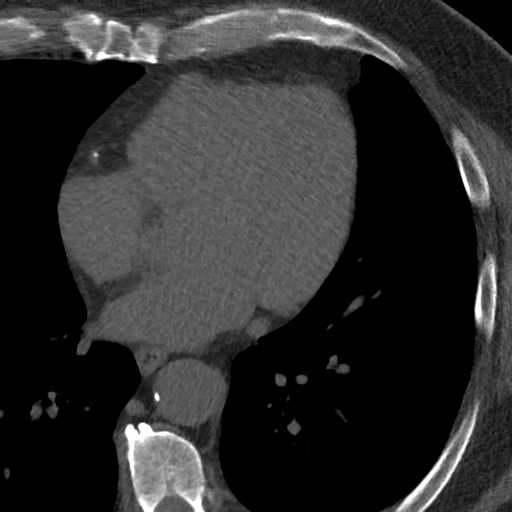
[im 25/42  vessel]
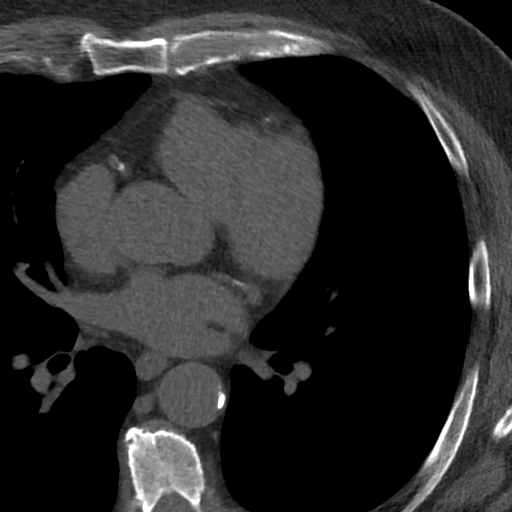
[im 33/42  vessel]
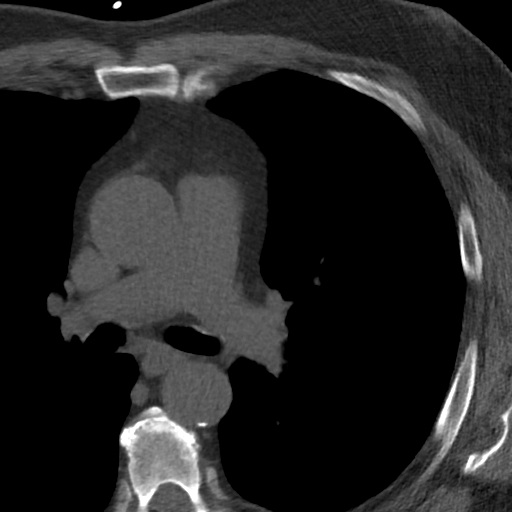

[Series 3: lung 70 % · axial · 0.78mm/px · z∈[-248,-164]mm · 5 of 42 slices shown]
[im 7/42  lung]
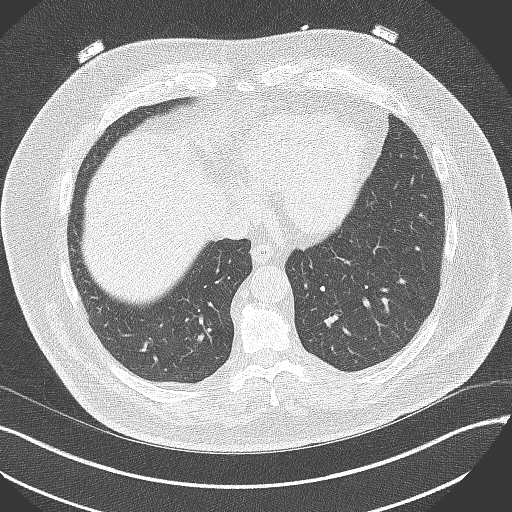
[im 14/42  lung]
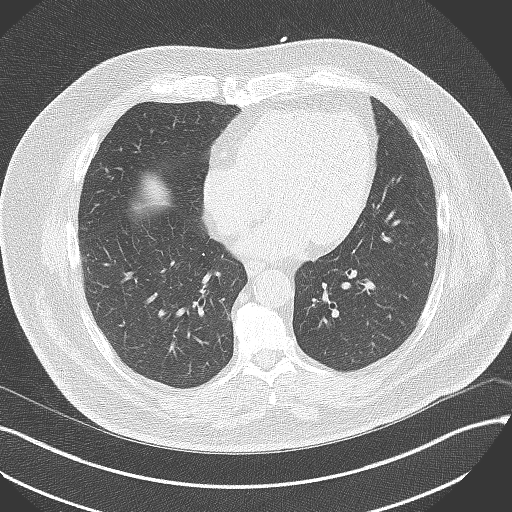
[im 21/42  lung]
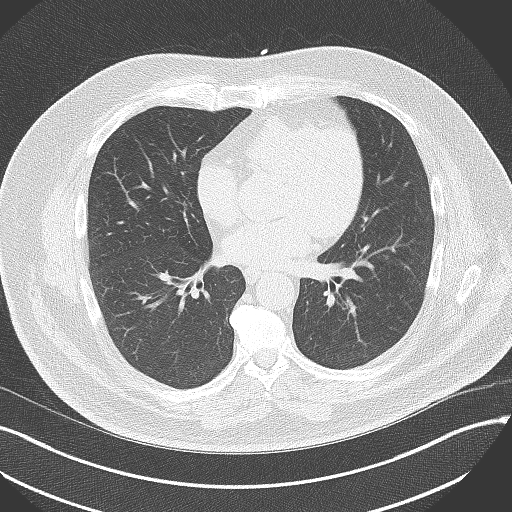
[im 28/42  lung]
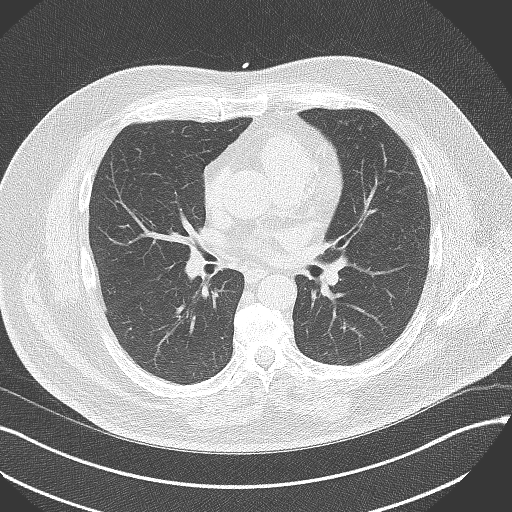
[im 35/42  lung]
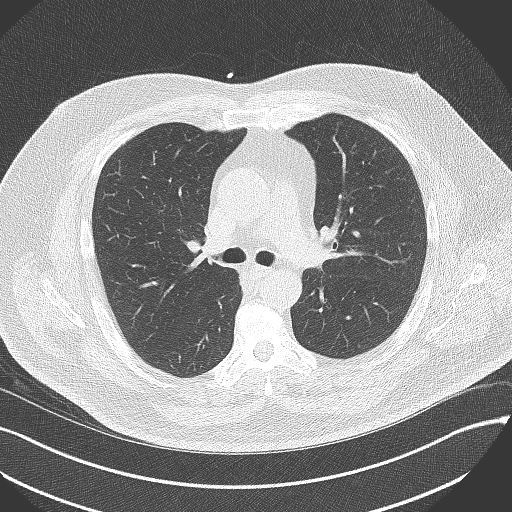

[Series 4: lung st 70 % · axial · 0.78mm/px · z∈[-248,-164]mm · 5 of 42 slices shown]
[im 7/42  lung]
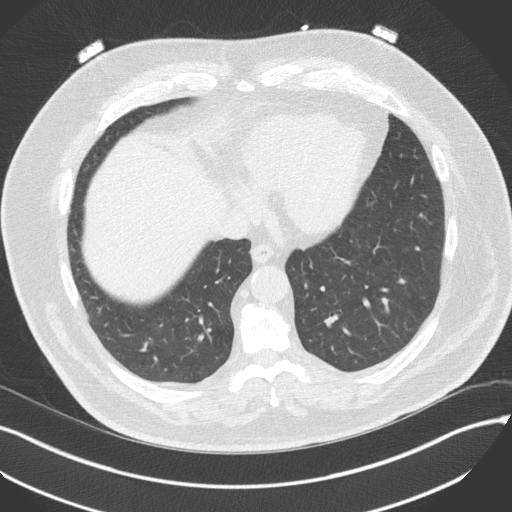
[im 14/42  lung]
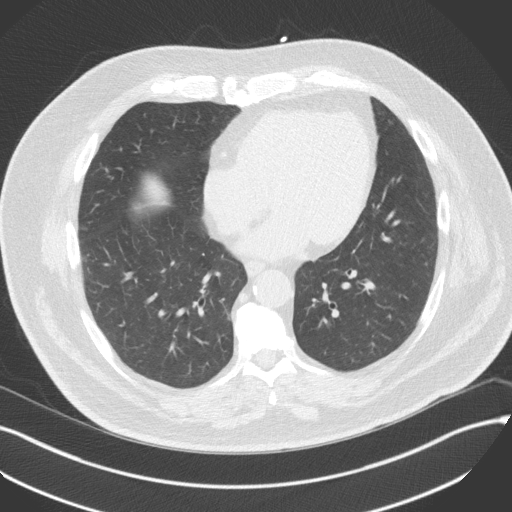
[im 21/42  lung]
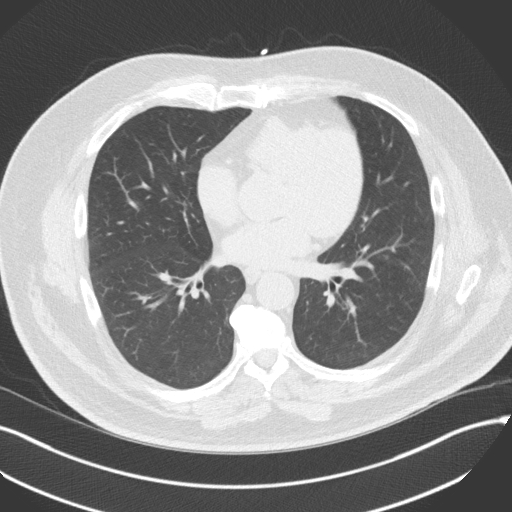
[im 28/42  lung]
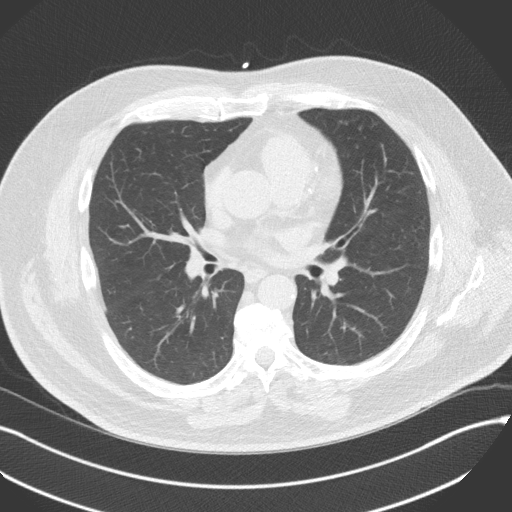
[im 35/42  lung]
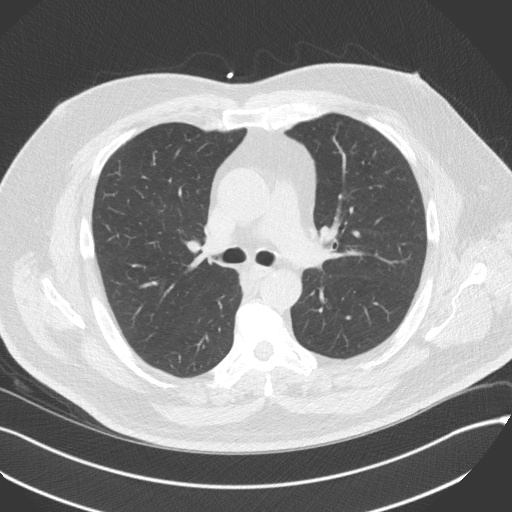

[14 of 20 positions shown; findings below may reference images not displayed]

FINDINGS: Aortic atherosclerosis. Within the visualized portions of the thorax
there are no suspicious appearing pulmonary nodules or masses, there
is no acute consolidative airspace disease, no pleural effusions, no
pneumothorax and no lymphadenopathy. Visualized portions of the
upper abdomen are unremarkable. There are no aggressive appearing
lytic or blastic lesions noted in the visualized portions of the
skeleton.
IMPRESSION: 1.  Aortic Atherosclerosis (P130W-SWS.S).
FINDINGS: Non-cardiac: See separate report from [REDACTED].

Ascending Aorta: Normal size, mild diffuse calcifications.

Pericardium: Normal.

Coronary arteries: Normal origin.
IMPRESSION: Coronary calcium score of 86.5. This was 50 percentile for age and
sex matched control.

*** End of Addendum ***
EXAM:
OVER-READ INTERPRETATION  CT CHEST

The following report is an over-read performed by radiologist Dr.
Tonza Sillsten [REDACTED] on 09/24/2019. This
over-read does not include interpretation of cardiac or coronary
anatomy or pathology. The coronary calcium score interpretation by
the cardiologist is attached.
FINDINGS: Aortic atherosclerosis. Within the visualized portions of the thorax
there are no suspicious appearing pulmonary nodules or masses, there
is no acute consolidative airspace disease, no pleural effusions, no
pneumothorax and no lymphadenopathy. Visualized portions of the
upper abdomen are unremarkable. There are no aggressive appearing
lytic or blastic lesions noted in the visualized portions of the
skeleton.
IMPRESSION: 1.  Aortic Atherosclerosis (P130W-SWS.S).

## 2020-10-07 ENCOUNTER — Other Ambulatory Visit: Payer: Self-pay | Admitting: Family

## 2020-10-07 DIAGNOSIS — E1165 Type 2 diabetes mellitus with hyperglycemia: Secondary | ICD-10-CM

## 2020-10-07 DIAGNOSIS — I152 Hypertension secondary to endocrine disorders: Secondary | ICD-10-CM

## 2020-10-07 DIAGNOSIS — E1159 Type 2 diabetes mellitus with other circulatory complications: Secondary | ICD-10-CM

## 2020-10-08 ENCOUNTER — Other Ambulatory Visit: Payer: Self-pay | Admitting: Family

## 2020-10-14 ENCOUNTER — Ambulatory Visit: Payer: Medicare Other | Admitting: *Deleted

## 2020-10-14 ENCOUNTER — Ambulatory Visit (INDEPENDENT_AMBULATORY_CARE_PROVIDER_SITE_OTHER): Payer: Medicare Other

## 2020-10-14 ENCOUNTER — Ambulatory Visit: Payer: Medicare Other

## 2020-10-14 ENCOUNTER — Other Ambulatory Visit: Payer: Self-pay

## 2020-10-14 VITALS — Ht 68.0 in | Wt 240.0 lb

## 2020-10-14 DIAGNOSIS — Z0000 Encounter for general adult medical examination without abnormal findings: Secondary | ICD-10-CM

## 2020-10-14 NOTE — Progress Notes (Signed)
Subjective:   KAMARON DESKINS is a 65 y.o. male who presents for an Initial Medicare Annual Wellness Visit.  Virtual Visit via Telephone Note  I connected with  [name] on 10/14/20 at  2:45 PM EDT by telephone and verified that I am speaking with the correct person using two identifiers.  Location: Patient: Home Provider: [facility] Persons participating in the virtual visit: patient/Nurse Health Advisor   I discussed the limitations, risks, security and privacy concerns of performing an evaluation and management service by telephone and the availability of in person appointments. The patient expressed understanding and agreed to proceed.  Interactive audio and video telecommunications were attempted between this nurse and patient, however failed, due to patient having technical difficulties OR patient did not have access to video capability.  We continued and completed visit with audio only.  Some vital signs may be absent or patient reported.   Vishal Sandlin E Jalexus Brett, LPN   Review of Systems     Cardiac Risk Factors include: advanced age (>37men, >68 women);diabetes mellitus;dyslipidemia;hypertension;male gender;obesity (BMI >30kg/m2);sedentary lifestyle     Objective:    Today's Vitals   10/14/20 1427  Weight: 240 lb (108.9 kg)  Height: 5\' 8"  (1.727 m)  PainSc: 3    Body mass index is 36.49 kg/m.  Advanced Directives 10/14/2020 08/11/2020 06/09/2020 12/03/2019 09/01/2019 05/04/2019 02/02/2019  Does Patient Have a Medical Advance Directive? No No No No No No No  Would patient like information on creating a medical advance directive? No - Patient declined - No - Patient declined No - Patient declined No - Patient declined No - Patient declined No - Patient declined    Current Medications (verified) Outpatient Encounter Medications as of 10/14/2020  Medication Sig   aspirin 81 MG chewable tablet Chew 81 mg by mouth daily.   baclofen (LIORESAL) 10 MG tablet Take 1 tablet (10 mg total)  by mouth 3 (three) times daily. As needed for muscle spasm (Patient not taking: Reported on 08/29/2020)   clotrimazole (MYCELEX) 10 MG troche Take 1 tablet (10 mg total) by mouth 5 (five) times daily. (Patient not taking: No sig reported)   glimepiride (AMARYL) 2 MG tablet Take 1 tablet (2 mg total) by mouth daily.   hydrochlorothiazide (MICROZIDE) 12.5 MG capsule TAKE (1) CAPSULE DAILY   ketoconazole (NIZORAL) 2 % cream Apply 1 application topically daily.   losartan (COZAAR) 100 MG tablet Take 1 tablet (100 mg total) by mouth daily.   meloxicam (MOBIC) 15 MG tablet TAKE 1 TABELT ONCE DAILY   metFORMIN (GLUCOPHAGE) 1000 MG tablet TAKE 1 TABLET IN THE MORNING AND 1&1/2 TABLETS IN THE EVENING   Multiple Vitamins-Minerals (MULTI FOR HIM) TABS Take 1 tablet by mouth daily.   naloxone (NARCAN) nasal spray 4 mg/0.1 mL Place 1 spray into the nose as needed (opioid overdose).   ondansetron (ZOFRAN) 4 MG tablet Take 1 tablet (4 mg total) by mouth every 8 (eight) hours as needed for nausea or vomiting.   oxyCODONE-acetaminophen (PERCOCET) 10-325 MG tablet Take 1-2 tablets by mouth every 6 (six) hours as needed for pain. MAXIMUM TOTAL ACETAMINOPHEN DOSE IS 4000 MG PER DAY (Patient taking differently: Take 1 tablet by mouth 2 (two) times daily.)   oxyCODONE-acetaminophen (PERCOCET) 10-325 MG tablet Take 1 tablet by mouth every 6 (six) hours as needed for pain.   simvastatin (ZOCOR) 10 MG tablet TAKE 1 TABLET DAILY   sodium chloride (OCEAN) 0.65 % SOLN nasal spray Place 1 spray into both nostrils  as needed for congestion.   No facility-administered encounter medications on file as of 10/14/2020.    Allergies (verified) Shellfish allergy, Leflunomide, Methotrexate, Canagliflozin, Cephalexin, Doxycycline, and Dulaglutide   History: Past Medical History:  Diagnosis Date   Anemia    taking iron   Arthritis    rheumatoid arthritis   Diabetes mellitus    Heart murmur    Hyperlipidemia    Hypertension     Primary localized osteoarthritis of right knee 05/13/2015   Scapho-lunate dissociation, right    Vitamin D deficiency    Past Surgical History:  Procedure Laterality Date   CARDIAC CATHETERIZATION  2005   normal   CARPAL TUNNEL RELEASE  2009   both hands   HARDWARE REMOVAL Right 11/14/2017   Procedure: REMOVAL ACCUTRAC SCREW RIGHT WRIST;  Surgeon: Daryll Brod, MD;  Location: [place];  Service: Orthopedics;  Laterality: Right;   KNEE ARTHROSCOPY     rt   PARTIAL KNEE ARTHROPLASTY Right 05/13/2015   Procedure: RIGHT UNI KNEE ARTHROPLASTY;  Surgeon: Marchia Bond, MD;  Location: [facility];  Service: Orthopedics;  Laterality: Right;   scaphoid excision Left 2012   Dr Daylene Katayama   TONSILLECTOMY     as a child   TOTAL SHOULDER ARTHROPLASTY Right 08/23/2020   Procedure: TOTAL SHOULDER ARTHROPLASTY;  Surgeon: Marchia Bond, MD;  Location: [facility];  Service: Orthopedics;  Laterality: Right;   WRIST FUSION Right 03/14/2017   Procedure: SCAPHOID EXCISION ULNAR FOUR BONE FUSION RIGHT;  Surgeon: Daryll Brod, MD;  Location: [facility];  Service: Orthopedics;  Laterality: Right;   Family History  Problem Relation Age of Onset   Diabetes Father    Hypertension Father    Heart Problems Father    Stroke Father    Diabetes Maternal Grandmother    Arthritis Sister    Arthritis Sister    Arthritis Daughter    Social History   Socioeconomic History   Marital status: Married    Spouse name: Helene Kelp   Number of children: 2   Years of education: Not on file   Highest education level: Not on file  Occupational History   Occupation: Disabled  Tobacco Use   Smoking status: Former    Pack years: 0.00    Types: Cigarettes    Quit date: 03/05/1991    Years since quitting: 29.6   Smokeless tobacco: Never  Vaping Use   Vaping Use: Never used  Substance and Sexual Activity   Alcohol use: Yes    Comment: rare   Drug use: No   Sexual activity: Not on file   Other Topics Concern   Not on file  Social History Narrative   Children live in [place] Resource Strain: Low Risk    Difficulty of Paying Living Expenses: Not hard at all  Food Insecurity: No Food Insecurity   Worried About Charity fundraiser in the Last Year: Never true   Heath in the Last Year: Never true  Transportation Needs: No Transportation Needs   Lack of Transportation (Medical): No   Lack of Transportation (Non-Medical): No  Physical Activity: Inactive   Days of Exercise per Week: 0 days   Minutes of Exercise per Session: 0 min  Stress: No Stress Concern Present   Feeling of Stress : Not at all  Social Connections: Socially Integrated   Frequency of Communication with Friends and Family: More than three times  a week   Frequency of Social Gatherings with Friends and Family: More than three times a week   Attends Religious Services: 1 to 4 times per year   Active Member of [employer] or Organizations: Yes   Attends Archivist Meetings: 1 to 4 times per year   Marital Status: Married    Tobacco Counseling Counseling given: Not Answered   Clinical Intake:  Pre-visit preparation completed: Yes  Pain : 0-10 Pain Score: 3  Pain Type: Acute pain Pain Location: Shoulder Pain Orientation: Right Pain Descriptors / Indicators: Discomfort, Sore, Aching Pain Onset: More than a month ago Pain Frequency: Intermittent     BMI - recorded: 36.49 Nutritional Status: BMI > 30  Obese Nutritional Risks: None Diabetes: Yes CBG done?: No Did pt. bring in CBG monitor from home?: No  How often do you need to have someone help you when you read instructions, pamphlets, or other written materials from your doctor or pharmacy?: 1 - Never  Nutrition Risk Assessment:  Has the patient had any N/V/D within the last 2 months?  No  Does the patient have any non-healing wounds?  No  Has the patient had any unintentional  weight loss or weight gain?  No   Diabetes:  Is the patient diabetic?  Yes  If diabetic, was a CBG obtained today?  No  Did the patient bring in their glucometer from home?  No  How often do you monitor your CBG's? No longer checks at home.   Financial Strains and Diabetes Management:  Are you having any financial strains with the device, your supplies or your medication? No .  Does the patient want to be seen by Chronic Care Management for management of their diabetes?  No  Would the patient like to be referred to a Nutritionist or for Diabetic Management?  No   Diabetic Exams:  Diabetic Eye Exam: Completed 11/04/19.   Diabetic Foot Exam: Completed 11/26/2019. Pt has been advised about the importance in completing this exam. Pt is scheduled for diabetic foot exam on 11/24/20.    Interpreter Needed?: No  Information entered by :: Javeria Briski, LPN   Activities of Daily Living In your present state of health, do you have any difficulty performing the following activities: 10/14/2020 08/11/2020  Hearing? N Y  Vision? N N  Difficulty concentrating or making decisions? N N  Walking or climbing stairs? N N  Dressing or bathing? N N  Doing errands, shopping? N N  Preparing Food and eating ? N -  Using the Toilet? N -  In the past six months, have you accidently leaked urine? N -  Do you have problems with loss of bowel control? N -  Managing your Medications? N -  Managing your Finances? N -  Housekeeping or managing your Housekeeping? N -  Some recent data might be hidden    Patient Care Team: Sharion Balloon, FNP as PCP - General (Nurse Practitioner) Josue Hector, MD as PCP - Cardiology (Cardiology) Gavin Pound, MD as Consulting Physician (Rheumatology) Josue Hector, MD as Consulting Physician (Cardiology) Jacelyn Pi, MD as Referring Physician (Endocrinology) Derek Jack, MD as Consulting Physician (Hematology) Marchia Bond, MD as Consulting Physician  (Orthopedic Surgery)  Indicate any recent Medical Services you may have received from other than [facility] providers in the past year (date may be approximate).     Assessment:   This is a routine wellness examination for [place].  Hearing/Vision screen Hearing Screening - Comments::  Denies hearing difficulties  Vision Screening - Comments:: Wears eyeglasses prn for reading, driving, etc - up to date with annual eye exam at [place] issues and exercise activities discussed: Current Exercise Habits: The patient does not participate in regular exercise at present, Exercise limited by: orthopedic condition(s)   Goals Addressed             This Visit's Progress    Exercise 3x per week (30 min per time)         Depression Screen PHQ 2/9 Scores 10/14/2020 05/31/2020 05/04/2020 02/26/2020 11/26/2019 11/18/2018 08/12/2018  PHQ - 2 Score 0 0 0 0 0 0 0  PHQ- 9 Score - - - - 0 - -    Fall Risk Fall Risk  10/14/2020 05/31/2020 05/04/2020 02/26/2020 11/26/2019  Falls in the past year? 0 0 0 0 0  Number falls in past yr: 0 - - - -  Injury with Fall? 0 - - - -  Risk for fall due to : Orthopedic patient;Impaired vision - - - -  Follow up Falls prevention discussed - - - -    FALL RISK PREVENTION PERTAINING TO THE HOME:  Any stairs in or around the home? Yes  If so, are there any without handrails? Yes  (advised these are important as you get older) Home free of loose throw rugs in walkways, pet beds, electrical cords, etc? Yes  Adequate lighting in your home to reduce risk of falls? Yes   ASSISTIVE DEVICES UTILIZED TO PREVENT FALLS:  Life alert? No  Use of a cane, walker or w/c? No  Grab bars in the bathroom? No  Shower chair or bench in shower? Yes  Elevated toilet seat or a handicapped toilet? No   TIMED UP AND GO:  Was the test performed? No . Telephonic visit  Cognitive Function:     6CIT Screen 10/14/2020  What Year? 0 points  What month? 0 points  What time? 0  points  Count back from 20 0 points  Months in reverse 0 points  Repeat phrase 0 points  Total Score 0    Immunizations Immunization History  Administered Date(s) Administered   Janssen (J&J) SARS-COV-2 Vaccination 08/25/2019   Pneumococcal Polysaccharide-23 09/23/2010   Tdap 11/23/2010    TDAP status: Up to date  Flu Vaccine status: Declined, Education has been provided regarding the importance of this vaccine but patient still declined. Advised may receive this vaccine at local pharmacy or Health Dept. Aware to provide a copy of the vaccination record if obtained from local pharmacy or Health Dept. Verbalized acceptance and understanding.  Pneumococcal vaccine status: Due, Education has been provided regarding the importance of this vaccine. Advised may receive this vaccine at local pharmacy or Health Dept. Aware to provide a copy of the vaccination record if obtained from local pharmacy or Health Dept. Verbalized acceptance and understanding.  Covid-19 vaccine status: Declined, Education has been provided regarding the importance of this vaccine but patient still declined. Advised may receive this vaccine at local pharmacy or Health Dept.or vaccine clinic. Aware to provide a copy of the vaccination record if obtained from local pharmacy or Health Dept. Verbalized acceptance and understanding.  Qualifies for Shingles Vaccine? Yes   Zostavax completed No   Shingrix Completed?: No.    Education has been provided regarding the importance of this vaccine. Patient has been advised to call insurance company to determine out of pocket expense if they have not yet received this  vaccine. Advised may also receive vaccine at local pharmacy or Health Dept. Verbalized acceptance and understanding.  Screening Tests Health Maintenance  Topic Date Due   Zoster Vaccines- Shingrix (1 of 2) Never done   COVID-19 Vaccine (2 - Janssen risk series) 10/31/2020 (Originally 09/22/2019)   PNA vac Low Risk  Adult (1 of 2 - PCV13) 11/25/2020 (Originally 07/11/2018)   OPHTHALMOLOGY EXAM  11/02/2020   INFLUENZA VACCINE  11/07/2020   TETANUS/TDAP  11/22/2020   FOOT EXAM  11/25/2020   HEMOGLOBIN A1C  02/11/2021   Fecal DNA (Cologuard)  08/14/2023   Hepatitis C Screening  Completed   HPV VACCINES  Aged Out    Health Maintenance  Health Maintenance Due  Topic Date Due   Zoster Vaccines- Shingrix (1 of 2) Never done    Colorectal cancer screening: Type of screening: Cologuard. Completed 08/13/20. Repeat every 3 years - His cologuard was positive - he will make appt for colonoscopy after he recovers from Shoulder surgery  Lung Cancer Screening: (Low Dose CT Chest recommended if Age 5-80 years, 30 pack-year currently smoking OR have quit w/in 15years.) does not qualify.   Additional Screening:  Hepatitis C Screening: does qualify; Completed 12/09/2014  Vision Screening: Recommended annual ophthalmology exams for early detection of glaucoma and other disorders of the eye. Is the patient up to date with their annual eye exam?  Yes  Who is the provider or what is the name of the office in which the patient attends annual eye exams? [facility] If pt is not established with a provider, would they like to be referred to a provider to establish care? No .   Dental Screening: Recommended annual dental exams for proper oral hygiene  Community Resource Referral / Chronic Care Management: CRR required this visit?  No   CCM required this visit?  No      Plan:     I have personally reviewed and noted the following in the patient's chart:   Medical and social history Use of alcohol, tobacco or illicit drugs  Current medications and supplements including opioid prescriptions. Patient is not currently taking opioid prescriptions. Functional ability and status Nutritional status Physical activity Advanced directives List of other physicians Hospitalizations, surgeries, and ER visits in  previous 12 months Vitals Screenings to include cognitive, depression, and falls Referrals and appointments  In addition, I have reviewed and discussed with patient certain preventive protocols, quality metrics, and best practice recommendations. A written personalized care plan for preventive services as well as general preventive health recommendations were provided to patient.     Sandrea Hammond, LPN   09/10/4032   Nurse Notes: None

## 2020-10-14 NOTE — Patient Instructions (Signed)
[name] , Thank you for taking time to come for your Medicare Wellness Visit. I appreciate your ongoing commitment to your health goals. Please review the following plan we discussed and let me know if I can assist you in the future.   Screening recommendations/referrals: Colonoscopy: Cologuard done 08/13/20 - positive - Make appointment for colonoscopy consultation after you recover from shoulder surgery Recommended yearly ophthalmology/optometry visit for glaucoma screening and checkup Recommended yearly dental visit for hygiene and checkup  Vaccinations: Influenza vaccine: Declined Pneumococcal vaccine: Pneumovax done 09/23/2010, due for Prevnar Tdap vaccine: done 11/23/2010 - Repeat in 10 years  Shingles vaccine: Due. Shingrix discussed. Please contact your pharmacy for coverage information.     Covid-19Alphonsa Overall Done 08/25/2019 - declines booster  Advanced directives: Advance directive discussed with you today. Even though you declined this today, please call our office should you change your mind, and we can give you the proper paperwork for you to fill out.   Conditions/risks identified: Aim for 30 minutes of exercise or brisk walking each day, drink 6-8 glasses of water and eat lots of fruits and vegetables - I have included tips for an anti-inflammatory diet which can help you manage the symptoms associated with Rheumatoid arthritis.   Next appointment: Follow up in one year for your annual wellness visit.   Preventive Care 67 Years and Older, Male  Preventive care refers to lifestyle choices and visits with your health care provider that can promote health and wellness. What does preventive care include? A yearly physical exam. This is also called an annual well check. Dental exams once or twice a year. Routine eye exams. Ask your health care provider how often you should have your eyes checked. Personal lifestyle choices, including: Daily care of your teeth and gums. Regular  physical activity. Eating a healthy diet. Avoiding tobacco and drug use. Limiting alcohol use. Practicing safe sex. Taking low doses of aspirin every day. Taking vitamin and mineral supplements as recommended by your health care provider. What happens during an annual well check? The services and screenings done by your health care provider during your annual well check will depend on your age, overall health, lifestyle risk factors, and family history of disease. Counseling  Your health care provider may ask you questions about your: Alcohol use. Tobacco use. Drug use. Emotional well-being. Home and relationship well-being. Sexual activity. Eating habits. History of falls. Memory and ability to understand (cognition). Work and work Statistician. Screening  You may have the following tests or measurements: Height, weight, and BMI. Blood pressure. Lipid and cholesterol levels. These may be checked every 5 years, or more frequently if you are over 56 years old. Skin check. Lung cancer screening. You may have this screening every year starting at age 86 if you have a 30-pack-year history of smoking and currently smoke or have quit within the past 15 years. Fecal occult blood test (FOBT) of the stool. You may have this test every year starting at age 50. Flexible sigmoidoscopy or colonoscopy. You may have a sigmoidoscopy every 5 years or a colonoscopy every 10 years starting at age 4. Prostate cancer screening. Recommendations will vary depending on your family history and other risks. Hepatitis C blood test. Hepatitis B blood test. Sexually transmitted disease (STD) testing. Diabetes screening. This is done by checking your blood sugar (glucose) after you have not eaten for a while (fasting). You may have this done every 1-3 years. Abdominal aortic aneurysm (AAA) screening. You may need this if  you are a current or former smoker. Osteoporosis. You may be screened starting at age 71  if you are at high risk. Talk with your health care provider about your test results, treatment options, and if necessary, the need for more tests. Vaccines  Your health care provider may recommend certain vaccines, such as: Influenza vaccine. This is recommended every year. Tetanus, diphtheria, and acellular pertussis (Tdap, Td) vaccine. You may need a Td booster every 10 years. Zoster vaccine. You may need this after age 10. Pneumococcal 13-valent conjugate (PCV13) vaccine. One dose is recommended after age 72. Pneumococcal polysaccharide (PPSV23) vaccine. One dose is recommended after age 58. Talk to your health care provider about which screenings and vaccines you need and how often you need them. This information is not intended to replace advice given to you by your health care provider. Make sure you discuss any questions you have with your health care provider. Document Released: 04/22/2015 Document Revised: 12/14/2015 Document Reviewed: 01/25/2015 Elsevier Interactive Patient Education  2017 [employer] Prevention in the Home Falls can cause injuries. They can happen to people of all ages. There are many things you can do to make your home safe and to help prevent falls. What can I do on the outside of my home? Regularly fix the edges of walkways and driveways and fix any cracks. Remove anything that might make you trip as you walk through a door, such as a raised step or threshold. Trim any bushes or trees on the path to your home. Use bright outdoor lighting. Clear any walking paths of anything that might make someone trip, such as rocks or tools. Regularly check to see if handrails are loose or broken. Make sure that both sides of any steps have handrails. Any raised decks and porches should have guardrails on the edges. Have any leaves, snow, or ice cleared regularly. Use sand or salt on walking paths during winter. Clean up any spills in your garage right away. This  includes oil or grease spills. What can I do in the bathroom? Use night lights. Install grab bars by the toilet and in the tub and shower. Do not use towel bars as grab bars. Use non-skid mats or decals in the tub or shower. If you need to sit down in the shower, use a plastic, non-slip stool. Keep the floor dry. Clean up any water that spills on the floor as soon as it happens. Remove soap buildup in the tub or shower regularly. Attach bath mats securely with double-sided non-slip rug tape. Do not have throw rugs and other things on the floor that can make you trip. What can I do in the bedroom? Use night lights. Make sure that you have a light by your bed that is easy to reach. Do not use any sheets or blankets that are too big for your bed. They should not hang down onto the floor. Have a firm chair that has side arms. You can use this for support while you get dressed. Do not have throw rugs and other things on the floor that can make you trip. What can I do in the kitchen? Clean up any spills right away. Avoid walking on wet floors. Keep items that you use a lot in easy-to-reach places. If you need to reach something above you, use a strong step stool that has a grab bar. Keep electrical cords out of the way. Do not use floor polish or wax that makes floors slippery. If  you must use wax, use non-skid floor wax. Do not have throw rugs and other things on the floor that can make you trip. What can I do with my stairs? Do not leave any items on the stairs. Make sure that there are handrails on both sides of the stairs and use them. Fix handrails that are broken or loose. Make sure that handrails are as long as the stairways. Check any carpeting to make sure that it is firmly attached to the stairs. Fix any carpet that is loose or worn. Avoid having throw rugs at the top or bottom of the stairs. If you do have throw rugs, attach them to the floor with carpet tape. Make sure that you  have a light switch at the top of the stairs and the bottom of the stairs. If you do not have them, ask someone to add them for you. What else can I do to help prevent falls? Wear shoes that: Do not have high heels. Have rubber bottoms. Are comfortable and fit you well. Are closed at the toe. Do not wear sandals. If you use a stepladder: Make sure that it is fully opened. Do not climb a closed stepladder. Make sure that both sides of the stepladder are locked into place. Ask someone to hold it for you, if possible. Clearly mark and make sure that you can see: Any grab bars or handrails. First and last steps. Where the edge of each step is. Use tools that help you move around (mobility aids) if they are needed. These include: Canes. Walkers. Scooters. Crutches. Turn on the lights when you go into a dark area. Replace any light bulbs as soon as they burn out. Set up your furniture so you have a clear path. Avoid moving your furniture around. If any of your floors are uneven, fix them. If there are any pets around you, be aware of where they are. Review your medicines with your doctor. Some medicines can make you feel dizzy. This can increase your chance of falling. Ask your doctor what other things that you can do to help prevent falls. This information is not intended to replace advice given to you by your health care provider. Make sure you discuss any questions you have with your health care provider. Document Released: 01/20/2009 Document Revised: 09/01/2015 Document Reviewed: 04/30/2014 Elsevier Interactive Patient Education  2017 [employer].

## 2020-11-01 NOTE — Progress Notes (Signed)
Patient ID: [name], male   DOB: [birth year], 65 y.o.   MRN: [id number]    65 y.o. with CRF;s central obesity, DM and HTN  Quit smoking in 1998 has significant arthritis That limits his activity Had normal myovue 03/28/16 for atypical chest pain Calcium socre 09/24/19 86 / 50 th percentile on statin   Had right wrist surgery with [name] 03/14/17 with no cardiac issues.  Had right total shoulder arthroplasty with Dr [name] 08/07/20 with no cardiac issues   Grand daughter from [place] visiting this week     ROS: Denies fever, malais, weight loss, blurry vision, decreased visual acuity, cough, sputum, SOB, hemoptysis, pleuritic pain, palpitaitons, heartburn, abdominal pain, melena, lower extremity edema, claudication, or rash.  All other systems reviewed and negative  General: Affect appropriate Obese HEENT: normal Neck supple with no adenopathy JVP normal no bruits no thyromegaly Lungs clear with no wheezing and good diaphragmatic motion Heart:  S1/S2 no murmur, no rub, gallop or click PMI normal Abdomen: benighn, BS positve, no tenderness, no AAA no bruit.  No HSM or HJR Distal pulses intact with no bruits No edema Neuro non-focal Skin warm and dry Right wrist less mobility post surgery  Post right shoulder replacement  Post right TKR     Current Outpatient Medications  Medication Sig Dispense Refill   aspirin 81 MG chewable tablet Chew 81 mg by mouth daily.     glimepiride (AMARYL) 2 MG tablet Take 1 tablet (2 mg total) by mouth daily. 90 tablet 2   hydrochlorothiazide (MICROZIDE) 12.5 MG capsule TAKE (1) CAPSULE DAILY 90 capsule 1   ketoconazole (NIZORAL) 2 % cream Apply 1 application topically daily. 15 g 0   losartan (COZAAR) 100 MG tablet Take 1 tablet (100 mg total) by mouth daily. 90 tablet 3   meloxicam (MOBIC) 15 MG tablet TAKE 1 TABELT ONCE DAILY 90 tablet 2   metFORMIN (GLUCOPHAGE) 1000 MG tablet TAKE 1 TABLET IN THE MORNING AND 1&1/2 TABLETS IN THE EVENING  225 tablet 0   Multiple Vitamins-Minerals (MULTI FOR HIM) TABS Take 1 tablet by mouth daily.     oxyCODONE-acetaminophen (PERCOCET) 10-325 MG tablet Take 1-2 tablets by mouth every 6 (six) hours as needed for pain. MAXIMUM TOTAL ACETAMINOPHEN DOSE IS 4000 MG PER DAY 50 tablet 0   oxyCODONE-acetaminophen (PERCOCET) 10-325 MG tablet Take 1 tablet by mouth every 6 (six) hours as needed for pain. 20 tablet 0   simvastatin (ZOCOR) 10 MG tablet TAKE 1 TABLET DAILY 90 tablet 1   sodium chloride (OCEAN) 0.65 % SOLN nasal spray Place 1 spray into both nostrils as needed for congestion.     baclofen (LIORESAL) 10 MG tablet Take 1 tablet (10 mg total) by mouth 3 (three) times daily. As needed for muscle spasm (Patient not taking: No sig reported) 30 tablet 0   clotrimazole (MYCELEX) 10 MG troche Take 1 tablet (10 mg total) by mouth 5 (five) times daily. (Patient not taking: Reported on 11/07/2020) 70 tablet 3   naloxone (NARCAN) nasal spray 4 mg/0.1 mL Place 1 spray into the nose as needed (opioid overdose). (Patient not taking: Reported on 11/07/2020)     ondansetron (ZOFRAN) 4 MG tablet Take 1 tablet (4 mg total) by mouth every 8 (eight) hours as needed for nausea or vomiting. (Patient not taking: Reported on 11/07/2020) 10 tablet 0   No current facility-administered medications for this visit.    Allergies  Shellfish allergy, Leflunomide, Methotrexate, Canagliflozin, Cephalexin, Doxycycline,  and Dulaglutide  Electrocardiogram:  05/09/18 SR rate 69 normal  09/14/19 SR rate 70 LAD normal 11/07/2020 NSR rate 67 normal   Assessment and Plan HTN: continue ARB and diuretic stable  [id number] low carb diet.  Target hemoglobin A1c is 6.5 or less.  Continue current medications. Arthritis:continue monthly abatacept with rheum  Cholesterol is at goal.  Continue current dose of statin and diet Rx.  No myalgias or side effects.  F/U  LFT's in 6 months. Lab Results  Component Value Date   LDLCALC 62 11/26/2019   Chest  Pain :  Atypical abnormal ECG normal myovue 2017 calcium score 86 / 50 th percentile for age and sex continue statin          F/u with me in a year  Calcium score in a year    Jenkins Rouge

## 2020-11-07 ENCOUNTER — Ambulatory Visit (INDEPENDENT_AMBULATORY_CARE_PROVIDER_SITE_OTHER): Payer: Medicare Other | Admitting: Cardiovascular Disease

## 2020-11-07 ENCOUNTER — Encounter: Payer: Self-pay | Admitting: Cardiovascular Disease

## 2020-11-07 ENCOUNTER — Other Ambulatory Visit: Payer: Self-pay

## 2020-11-07 VITALS — BP 134/70 | HR 67 | Ht 68.0 in | Wt 238.4 lb

## 2020-11-07 DIAGNOSIS — E782 Mixed hyperlipidemia: Secondary | ICD-10-CM

## 2020-11-07 DIAGNOSIS — E119 Type 2 diabetes mellitus without complications: Secondary | ICD-10-CM

## 2020-11-07 DIAGNOSIS — I10 Essential (primary) hypertension: Secondary | ICD-10-CM | POA: Diagnosis not present

## 2020-11-07 DIAGNOSIS — R079 Chest pain, unspecified: Secondary | ICD-10-CM | POA: Diagnosis not present

## 2020-11-07 NOTE — Patient Instructions (Addendum)
Medication Instructions:  *If you need a refill on your cardiac medications before your next appointment, please call your pharmacy*  Lab Work: If you have labs (blood work) drawn today and your tests are completely normal, you will receive your results only by: [place] (if you have MyChart) OR A paper copy in the mail If you have any lab test that is abnormal or we need to change your treatment, we will call you to review the results.  Follow-Up: At [facility], you and your health needs are our priority.  As part of our continuing mission to provide you with exceptional heart care, we have created designated Provider Care Teams.  These Care Teams include your primary Cardiologist (physician) and Advanced Practice Providers (APPs -  Physician Assistants and Nurse Practitioners) who all work together to provide you with the care you need, when you need it.  We recommend signing up for the patient portal called "MyChart".  Sign up information is provided on this After Visit Summary.  MyChart is used to connect with patients for Virtual Visits (Telemedicine).  Patients are able to view lab/test results, encounter notes, upcoming appointments, etc.  Non-urgent messages can be sent to your provider as well.   To learn more about what you can do with MyChart, go to [email or web address].    Your next appointment:   12 month(s)  The format for your next appointment:   In Person  Provider:   You may see Jenkins Rouge, MD or one of the following Advanced Practice Providers on your designated Care Team:   Cecilie Kicks, NP

## 2020-11-18 ENCOUNTER — Encounter: Payer: Self-pay | Admitting: Family

## 2020-11-18 ENCOUNTER — Ambulatory Visit (INDEPENDENT_AMBULATORY_CARE_PROVIDER_SITE_OTHER): Payer: Medicare Other | Admitting: Family

## 2020-11-18 ENCOUNTER — Other Ambulatory Visit: Payer: Self-pay

## 2020-11-18 VITALS — BP 136/85 | HR 67 | Temp 97.9°F | Ht 68.0 in | Wt 239.6 lb

## 2020-11-18 DIAGNOSIS — E1169 Type 2 diabetes mellitus with other specified complication: Secondary | ICD-10-CM

## 2020-11-18 DIAGNOSIS — M19011 Primary osteoarthritis, right shoulder: Secondary | ICD-10-CM

## 2020-11-18 DIAGNOSIS — I152 Hypertension secondary to endocrine disorders: Secondary | ICD-10-CM | POA: Diagnosis not present

## 2020-11-18 DIAGNOSIS — E1165 Type 2 diabetes mellitus with hyperglycemia: Secondary | ICD-10-CM

## 2020-11-18 DIAGNOSIS — R195 Other fecal abnormalities: Secondary | ICD-10-CM

## 2020-11-18 DIAGNOSIS — A6000 Herpesviral infection of urogenital system, unspecified: Secondary | ICD-10-CM

## 2020-11-18 DIAGNOSIS — D509 Iron deficiency anemia, unspecified: Secondary | ICD-10-CM

## 2020-11-18 DIAGNOSIS — E785 Hyperlipidemia, unspecified: Secondary | ICD-10-CM

## 2020-11-18 DIAGNOSIS — E1159 Type 2 diabetes mellitus with other circulatory complications: Secondary | ICD-10-CM | POA: Diagnosis not present

## 2020-11-18 LAB — BAYER DCA HB A1C WAIVED: HB A1C (BAYER DCA - WAIVED): 6 % (ref <7.0)

## 2020-11-18 MED ORDER — VALACYCLOVIR HCL 1 G PO TABS: 1000.0000 mg | ORAL_TABLET | Freq: Two times a day (BID) | ORAL | 5 refills | Status: DC

## 2020-11-18 NOTE — Patient Instructions (Signed)
Colonoscopy, Adult A colonoscopy is a procedure to look at the entire large intestine. This procedure is done using a long, thin, flexible tube that has a camera on theend. You may have a colonoscopy: As a part of normal colorectal screening. If you have certain symptoms, such as: A low number of red blood cells in your blood (anemia). Diarrhea that does not go away. Pain in your abdomen. Blood in your stool. A colonoscopy can help screen for and diagnose medical problems, including: Tumors. Extra tissue that grows where mucus forms (polyps). Inflammation. Areas of bleeding. Tell your health care provider about: Any allergies you have. All medicines you are taking, including vitamins, herbs, eye drops, creams, and over-the-counter medicines. Any problems you or family members have had with anesthetic medicines. Any blood disorders you have. Any surgeries you have had. Any medical conditions you have. Any problems you have had with having bowel movements. Whether you are pregnant or may be pregnant. What are the risks? Generally, this is a safe procedure. However, problems may occur, including: Bleeding. Damage to your intestine. Allergic reactions to medicines given during the procedure. Infection. This is rare. What happens before the procedure? Eating and drinking restrictions Follow instructions from your health care provider about eating or drinking restrictions, which may include: A few days before the procedure: Follow a low-fiber diet. Avoid nuts, seeds, dried fruit, raw fruits, and vegetables. 1-3 days before the procedure: Eat only gelatin dessert or ice pops. Drink only clear liquids, such as water, clear juice, clear broth or bouillon, black coffee or tea, or clear soft drinks or sports drinks. Avoid liquids that contain red or purple dye. The day of the procedure: Do not eat solid foods. You may continue to drink clear liquids until up to 2 hours before the  procedure. Do not eat or drink anything starting 2 hours before the procedure, or within the time period that your health care provider recommends. Bowel prep If you were prescribed a bowel prep to take by mouth (orally) to clean out your colon: Take it as told by your health care provider. Starting the day before your procedure, you will need to drink a large amount of liquid medicine. The liquid will cause you to have many bowel movements of loose stool until your stool becomes almost clear or light green. If your skin or the opening between the buttocks (anus) gets irritated from diarrhea, you may relieve the irritation using: Wipes with medicine in them, such as adult wet wipes with aloe and vitamin E. A product to soothe skin, such as petroleum jelly. If you vomit while drinking the bowel prep: Take a break for up to 60 minutes. Begin the bowel prep again. Call your health care provider if you keep vomiting or you cannot take the bowel prep without vomiting. To clean out your colon, you may also be given: Laxative medicines. These help you have a bowel movement. Instructions for enema use. An enema is liquid medicine injected into your rectum. Medicines Ask your health care provider about: Changing or stopping your regular medicines or supplements. This is especially important if you are taking iron supplements, diabetes medicines, or blood thinners. Taking medicines such as aspirin and ibuprofen. These medicines can thin your blood. Do not take these medicines unless your health care provider tells you to take them. Taking over-the-counter medicines, vitamins, herbs, and supplements. General instructions Ask your health care provider what steps will be taken to help prevent infection. These may include washing  skin with a germ-killing soap. Plan to have someone take you home from the hospital or clinic. What happens during the procedure?  An IV will be inserted into one of your  veins. You may be given one or more of the following: A medicine to help you relax (sedative). A medicine to numb the area (local anesthetic). A medicine to make you fall asleep (general anesthetic). This is rarely needed. You will lie on your side with your knees bent. The tube will: Have oil or gel put on it (be lubricated). Be inserted into your anus. Be gently eased through all parts of your large intestine. Air will be sent into your colon to keep it open. This may cause some pressure or cramping. Images will be taken with the camera and will appear on a screen. A small tissue sample may be removed to be looked at under a microscope (biopsy). The tissue may be sent to a lab for testing if any signs of problems are found. If small polyps are found, they may be removed and checked for cancer cells. When the procedure is finished, the tube will be removed. The procedure may vary among health care providers and hospitals. What happens after the procedure? Your blood pressure, heart rate, breathing rate, and blood oxygen level will be monitored until you leave the hospital or clinic. You may have a small amount of blood in your stool. You may pass gas and have mild cramping or bloating in your abdomen. This is caused by the air that was used to open your colon during the exam. Do not drive for 24 hours after the procedure. It is up to you to get the results of your procedure. Ask your health care provider, or the department that is doing the procedure, when your results will be ready. Summary A colonoscopy is a procedure to look at the entire large intestine. Follow instructions from your health care provider about eating and drinking before the procedure. If you were prescribed an oral bowel prep to clean out your colon, take it as told by your health care provider. During the colonoscopy, a flexible tube with a camera on its end is inserted into the anus and then passed into the other  parts of the large intestine. This information is not intended to replace advice given to you by your health care provider. Make sure you discuss any questions you have with your healthcare provider. Document Revised: 10/17/2018 Document Reviewed: 10/17/2018 Elsevier Patient Education  [place].

## 2020-11-18 NOTE — Progress Notes (Signed)
Subjective:    Patient ID: [name], male    DOB: 03/11/1956, 65 y.o.   MRN: [id number]  Chief Complaint  Patient presents with   Medical Management of Chronic Issues    + cologaurd 3 mths ago   PT presnets to the office today for chronic follow up. Pt has rheumatoid arthritis and followed by rheumatologists every 6 months. Pt is followed by Cardiologists annually. He is followed by Hematologists for iron deficiency.     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 He is currently wearing a sling and states his pain is stable.   He had a positive cologuard on 08/13/20.  Diabetes He presents for his follow-up diabetic visit. He has type 2 diabetes mellitus. Pertinent negatives for diabetes include no blurred vision and no foot paresthesias. Symptoms are stable. Diabetic complications include heart disease. Risk factors for coronary artery disease include dyslipidemia, diabetes mellitus, male sex, hypertension and sedentary lifestyle. He is following a generally healthy diet. (Does not check regularly)  Hypertension This is a chronic problem. The current episode started more than 1 year ago. The problem has been resolved since onset. Pertinent negatives include no blurred vision, malaise/fatigue, peripheral edema or shortness of breath. Risk factors for coronary artery disease include dyslipidemia, obesity and male gender. The current treatment provides moderate improvement. Hypertensive end-organ damage includes CAD/MI. There is no history of heart failure.  Hyperlipidemia This is a chronic problem. The current episode started more than 1 year ago. The problem is controlled. Exacerbating diseases include obesity. Pertinent negatives include no shortness of breath. Current antihyperlipidemic treatment includes statins. The current treatment provides moderate improvement of lipids. Risk factors for coronary artery disease include dyslipidemia, diabetes mellitus and a sedentary lifestyle.   Arthritis Presents for follow-up visit. He complains of joint swelling and joint warmth. Affected locations include the left knee, right knee, left MCP and right MCP. His pain is at a severity of 3/10.  Anemia Presents for follow-up visit. There has been no malaise/fatigue. There is no history of heart failure.  Herpes States his last flare up was last two days. Has been out of medication.     Review of Systems  Constitutional:  Negative for malaise/fatigue.  Eyes:  Negative for blurred vision.  Respiratory:  Negative for shortness of breath.   Musculoskeletal:  Positive for arthritis and joint swelling.  All other systems reviewed and are negative.     Objective:   Physical Exam Vitals reviewed.  Constitutional:      General: He is not in acute distress.    Appearance: He is well-developed. He is obese.  HENT:     Head: Normocephalic.     Right Ear: Tympanic membrane normal.     Left Ear: Tympanic membrane normal.  Eyes:     General:        Right eye: No discharge.        Left eye: No discharge.     Pupils: Pupils are equal, round, and reactive to light.  Neck:     Thyroid: No thyromegaly.  Cardiovascular:     Rate and Rhythm: Normal rate and regular rhythm.     Heart sounds: Normal heart sounds. No murmur heard. Pulmonary:     Effort: Pulmonary effort is normal. No respiratory distress.     Breath sounds: Normal breath sounds. No wheezing.  Abdominal:     General: Bowel sounds are normal. There is no distension.     Palpations: Abdomen is soft.  Tenderness: There is no abdominal tenderness.  Musculoskeletal:        General: No tenderness. Normal range of motion.     Cervical back: Normal range of motion and neck supple.  Skin:    General: Skin is warm and dry.     Findings: No erythema or rash.  Neurological:     Mental Status: He is alert and oriented to person, place, and time.     Cranial Nerves: No cranial nerve deficit.     Deep Tendon Reflexes:  Reflexes are normal and symmetric.  Psychiatric:        Behavior: Behavior normal.        Thought Content: Thought content normal.        Judgment: Judgment normal.     BP 136/85   Pulse 67   Temp 97.9 F (36.6 C) (Temporal)   Ht 5' 8"  (1.727 m)   Wt 239 lb 9.6 oz (108.7 kg)   BMI 36.43 kg/m      Assessment & Plan:  [name] comes in today with chief complaint of Medical Management of Chronic Issues (+ cologaurd 3 mths ago)   Diagnosis and orders addressed:  1. Hypertension associated with diabetes ([place]) - CMP14+EGFR - CBC with Differential/Platelet  2. Type 2 diabetes mellitus with hyperglycemia, without long-term current use of insulin (HCC) - CMP14+EGFR - CBC with Differential/Platelet - Bayer DCA Hb A1c Waived  3. Hyperlipidemia associated with type 2 diabetes mellitus (HCC) - CMP14+EGFR - CBC with Differential/Platelet  4. Osteoarthritis of right shoulder, unspecified osteoarthritis type - CMP14+EGFR - CBC with Differential/Platelet  5. Microcytic anemia - CMP14+EGFR - CBC with Differential/Platelet  6. Morbid obesity ([place]) - CMP14+EGFR - CBC with Differential/Platelet  7. Positive colorectal cancer screening using Cologuard test - Ambulatory referral to Gastroenterology - CMP14+EGFR - CBC with Differential/Platelet  8. Genital herpes simplex, unspecified site - valACYclovir (VALTREX) 1000 MG tablet; Take 1 tablet (1,000 mg total) by mouth 2 (two) times daily.  Dispense: 20 tablet; Refill: 5   Labs pending Health Maintenance reviewed Diet and exercise encouraged  Follow up plan: 3 months   Evelina Dun, FNP

## 2020-11-19 LAB — CMP14+EGFR
ALT: 22 IU/L (ref 0–44)
AST: 24 IU/L (ref 0–40)
Albumin/Globulin Ratio: 2 (ref 1.2–2.2)
Albumin: 4.6 g/dL (ref 3.8–4.8)
Alkaline Phosphatase: 80 IU/L (ref 44–121)
BUN/Creatinine Ratio: 14 (ref 10–24)
BUN: 15 mg/dL (ref 8–27)
Bilirubin Total: 0.3 mg/dL (ref 0.0–1.2)
CO2: 22 mmol/L (ref 20–29)
Calcium: 9.5 mg/dL (ref 8.6–10.2)
Chloride: 101 mmol/L (ref 96–106)
Creatinine, Ser: 1.05 mg/dL (ref 0.76–1.27)
Globulin, Total: 2.3 g/dL (ref 1.5–4.5)
Glucose: 149 mg/dL — ABNORMAL HIGH (ref 65–99)
Potassium: 4.2 mmol/L (ref 3.5–5.2)
Sodium: 140 mmol/L (ref 134–144)
Total Protein: 6.9 g/dL (ref 6.0–8.5)
eGFR: 78 mL/min/{1.73_m2} (ref >59)

## 2020-11-19 LAB — CBC WITH DIFFERENTIAL/PLATELET
Basophils Absolute: 0.1 10*3/uL (ref 0.0–0.2)
Basos: 1 %
EOS (ABSOLUTE): 0.7 10*3/uL — ABNORMAL HIGH (ref 0.0–0.4)
Eos: 7 %
Hematocrit: 40.9 % (ref 37.5–51.0)
Hemoglobin: 13.6 g/dL (ref 13.0–17.7)
Immature Grans (Abs): 0 10*3/uL (ref 0.0–0.1)
Immature Granulocytes: 0 %
Lymphocytes Absolute: 3.7 10*3/uL — ABNORMAL HIGH (ref 0.7–3.1)
Lymphs: 38 %
MCH: 29.4 pg (ref 26.6–33.0)
MCHC: 33.3 g/dL (ref 31.5–35.7)
MCV: 89 fL (ref 79–97)
Monocytes Absolute: 0.7 10*3/uL (ref 0.1–0.9)
Monocytes: 7 %
Neutrophils Absolute: 4.6 10*3/uL (ref 1.4–7.0)
Neutrophils: 47 %
Platelets: 276 10*3/uL (ref 150–450)
RBC: 4.62 x10E6/uL (ref 4.14–5.80)
RDW: 12.5 % (ref 11.6–15.4)
WBC: 9.7 10*3/uL (ref 3.4–10.8)

## 2020-11-22 ENCOUNTER — Encounter: Payer: Self-pay | Admitting: Internal Medicine

## 2020-11-24 ENCOUNTER — Ambulatory Visit: Payer: Medicare Other | Admitting: Family

## 2020-12-06 LAB — HM DIABETES EYE EXAM

## 2020-12-15 ENCOUNTER — Other Ambulatory Visit: Payer: Self-pay

## 2020-12-15 ENCOUNTER — Inpatient Hospital Stay (HOSPITAL_COMMUNITY): Payer: Medicare Other | Attending: Hematology

## 2020-12-15 DIAGNOSIS — Z87891 Personal history of nicotine dependence: Secondary | ICD-10-CM | POA: Insufficient documentation

## 2020-12-15 DIAGNOSIS — D508 Other iron deficiency anemias: Secondary | ICD-10-CM

## 2020-12-15 DIAGNOSIS — E538 Deficiency of other specified B group vitamins: Secondary | ICD-10-CM | POA: Insufficient documentation

## 2020-12-15 DIAGNOSIS — D509 Iron deficiency anemia, unspecified: Secondary | ICD-10-CM | POA: Insufficient documentation

## 2020-12-15 DIAGNOSIS — M069 Rheumatoid arthritis, unspecified: Secondary | ICD-10-CM | POA: Insufficient documentation

## 2020-12-15 LAB — FERRITIN: Ferritin: 12 ng/mL — ABNORMAL LOW (ref 24–336)

## 2020-12-15 LAB — IRON AND TIBC
Iron: 89 ug/dL (ref 45–182)
Saturation Ratios: 20 % (ref 17.9–39.5)
TIBC: 440 ug/dL (ref 250–450)
UIBC: 351 ug/dL

## 2020-12-15 LAB — VITAMIN B12: Vitamin B-12: 184 pg/mL (ref 180–914)

## 2020-12-21 NOTE — Progress Notes (Addendum)
[address] [place], [place] [address]   CLINIC:  Medical Oncology/Hematology  PCP:  Sharion Balloon, [place] [address] [phone]   REASON FOR VISIT:  Follow-up for IDA  PRIOR THERAPY: None  CURRENT THERAPY: Intermittent IV iron (last Feraheme on 01/09/2019)  INTERVAL HISTORY:  [name] 65 y.o. male returns for routine follow-up of his iron-deficiency anemia.  He was last seen on 06/09/2020 by Dr. Delton Coombes.  At today's visit, he reports feeling well.  No recent hospitalizations, surgeries, or changes in baseline health status.  Patient reports that he is compliant with taking his iron pill daily, occasionally misses doses (less than 5 times per months).  He is not taking B12 at this time, states that he was not aware that he was supposed to be taking it.  He denies any bleeding events such as epistaxis, hematemesis, hematochezia, or melena.  He does report some mild fatigue, energy about 70%.  He denies any chest pain, dyspnea on exertion, dizziness, or syncopal episodes.  No B symptoms such as fever, chills, night sweats, unintentional weight loss.  He continues to take Orencia for his rheumatoid arthritis.  He has 70% energy and 100% appetite. He endorses that he is maintaining a stable weight.    REVIEW OF SYSTEMS:  Review of Systems  Constitutional:  Positive for fatigue. Negative for appetite change, chills, diaphoresis, fever and unexpected weight change.  HENT:   Negative for lump/mass and nosebleeds.   Eyes:  Negative for eye problems.  Respiratory:  Negative for cough, hemoptysis and shortness of breath.   Cardiovascular:  Negative for chest pain, leg swelling and palpitations.  Gastrointestinal:  Negative for abdominal pain, blood in stool, constipation, diarrhea, nausea and vomiting.  Genitourinary:  Negative for hematuria.   Musculoskeletal:  Positive for arthralgias.  Skin: Negative.   Neurological:   Negative for dizziness, headaches and light-headedness.  Hematological:  Does not bruise/bleed easily.     PAST MEDICAL/SURGICAL HISTORY:  Past Medical History:  Diagnosis Date   Anemia    taking iron   Arthritis    rheumatoid arthritis   Diabetes mellitus    Heart murmur    Hyperlipidemia    Hypertension    Primary localized osteoarthritis of right knee 05/13/2015   Scapho-lunate dissociation, right    Vitamin D deficiency    Past Surgical History:  Procedure Laterality Date   CARDIAC CATHETERIZATION  2005   normal   CARPAL TUNNEL RELEASE  2009   both hands   HARDWARE REMOVAL Right 11/14/2017   Procedure: REMOVAL ACCUTRAC SCREW RIGHT WRIST;  Surgeon: Daryll Brod, MD;  Location: [facility];  Service: Orthopedics;  Laterality: Right;   KNEE ARTHROSCOPY     rt   PARTIAL KNEE ARTHROPLASTY Right 05/13/2015   Procedure: RIGHT UNI KNEE ARTHROPLASTY;  Surgeon: Marchia Bond, MD;  Location: [facility];  Service: Orthopedics;  Laterality: Right;   scaphoid excision Left 2012   Dr Daylene Katayama   TONSILLECTOMY     as a child   TOTAL SHOULDER ARTHROPLASTY Right 08/23/2020   Procedure: TOTAL SHOULDER ARTHROPLASTY;  Surgeon: Marchia Bond, MD;  Location: [facility];  Service: Orthopedics;  Laterality: Right;   WRIST FUSION Right 03/14/2017   Procedure: SCAPHOID EXCISION ULNAR FOUR BONE FUSION RIGHT;  Surgeon: Daryll Brod, MD;  Location: [facility];  Service: Orthopedics;  Laterality: Right;     SOCIAL HISTORY:  Social History  Socioeconomic History   Marital status: Married    Spouse name: Helene Kelp   Number of children: 2   Years of education: Not on file   Highest education level: Not on file  Occupational History   Occupation: Disabled  Tobacco Use   Smoking status: Former    Types: Cigarettes    Quit date: 03/05/1991    Years since quitting: 29.8   Smokeless tobacco: Never  Vaping Use   Vaping Use: Never used  Substance and Sexual Activity    Alcohol use: Yes    Comment: rare   Drug use: No   Sexual activity: Not on file  Other Topics Concern   Not on file  Social History Narrative   Children live in [place] Strain: Low Risk    Difficulty of Paying Living Expenses: Not hard at all  Food Insecurity: No Food Insecurity   Worried About Charity fundraiser in the Last Year: Never true   [place] in the Last Year: Never true  Transportation Needs: No Transportation Needs   Lack of Transportation (Medical): No   Lack of Transportation (Non-Medical): No  Physical Activity: Inactive   Days of Exercise per Week: 0 days   Minutes of Exercise per Session: 0 min  Stress: No Stress Concern Present   Feeling of Stress : Not at all  Social Connections: Socially Integrated   Frequency of Communication with Friends and Family: More than three times a week   Frequency of Social Gatherings with Friends and Family: More than three times a week   Attends Religious Services: 1 to 4 times per year   Active Member of [employer] or Organizations: Yes   Attends Archivist Meetings: 1 to 4 times per year   Marital Status: Married  Human resources officer Violence: Not At Risk   Fear of Current or Ex-Partner: No   Emotionally Abused: No   Physically Abused: No   Sexually Abused: No    FAMILY HISTORY:  Family History  Problem Relation Age of Onset   Diabetes Father    Hypertension Father    Heart Problems Father    Stroke Father    Diabetes Maternal Grandmother    Arthritis Sister    Arthritis Sister    Arthritis Daughter     CURRENT MEDICATIONS:  Outpatient Encounter Medications as of 12/22/2020  Medication Sig   aspirin 81 MG chewable tablet Chew 81 mg by mouth daily.   clotrimazole (MYCELEX) 10 MG troche Take 1 tablet (10 mg total) by mouth 5 (five) times daily.   glimepiride (AMARYL) 2 MG tablet Take 1 tablet (2 mg total) by mouth daily.   hydrochlorothiazide  (MICROZIDE) 12.5 MG capsule TAKE (1) CAPSULE DAILY   ketoconazole (NIZORAL) 2 % cream Apply 1 application topically daily.   losartan (COZAAR) 100 MG tablet Take 1 tablet (100 mg total) by mouth daily.   meloxicam (MOBIC) 15 MG tablet TAKE 1 TABELT ONCE DAILY   metFORMIN (GLUCOPHAGE) 1000 MG tablet TAKE 1 TABLET IN THE MORNING AND 1&1/2 TABLETS IN THE EVENING   Multiple Vitamins-Minerals (MULTI FOR HIM) TABS Take 1 tablet by mouth daily.   naloxone (NARCAN) nasal spray 4 mg/0.1 mL Place 1 spray into the nose as needed (opioid overdose).   ondansetron (ZOFRAN) 4 MG tablet Take 1 tablet (4 mg total) by mouth every 8 (eight) hours as needed for nausea or vomiting.   oxyCODONE-acetaminophen (PERCOCET) 10-325  MG tablet Take 1-2 tablets by mouth every 6 (six) hours as needed for pain. MAXIMUM TOTAL ACETAMINOPHEN DOSE IS 4000 MG PER DAY   oxyCODONE-acetaminophen (PERCOCET) 10-325 MG tablet Take 1 tablet by mouth every 6 (six) hours as needed for pain.   simvastatin (ZOCOR) 10 MG tablet TAKE 1 TABLET DAILY   sodium chloride (OCEAN) 0.65 % SOLN nasal spray Place 1 spray into both nostrils as needed for congestion.   valACYclovir (VALTREX) 1000 MG tablet Take 1 tablet (1,000 mg total) by mouth 2 (two) times daily.   No facility-administered encounter medications on file as of 12/22/2020.    ALLERGIES:  Allergies  Allergen Reactions   Shellfish Allergy Shortness Of Breath and Swelling   Leflunomide     LFT ELEVATION   Methotrexate     LFT elevation   Canagliflozin Other (See Comments)    Causes bladder infections    Cephalexin Other (See Comments)    nervous    Doxycycline Nausea And Vomiting   Dulaglutide Diarrhea and Nausea Only     PHYSICAL EXAM:  ECOG PERFORMANCE STATUS: 1 - Symptomatic but completely ambulatory  There were no vitals filed for this visit. There were no vitals filed for this visit. Physical Exam Constitutional:      Appearance: Normal appearance. He is obese.   HENT:     Head: Normocephalic and atraumatic.     Mouth/Throat:     Mouth: Mucous membranes are moist.  Eyes:     Extraocular Movements: Extraocular movements intact.     Pupils: Pupils are equal, round, and reactive to light.  Cardiovascular:     Rate and Rhythm: Normal rate and regular rhythm.     Pulses: Normal pulses.     Heart sounds: Normal heart sounds.  Pulmonary:     Effort: Pulmonary effort is normal.     Breath sounds: Normal breath sounds.  Abdominal:     General: Bowel sounds are normal.     Palpations: Abdomen is soft.     Tenderness: There is no abdominal tenderness.  Musculoskeletal:        General: No swelling.     Right lower leg: No edema.     Left lower leg: No edema.  Lymphadenopathy:     Cervical: No cervical adenopathy.  Skin:    General: Skin is warm and dry.  Neurological:     General: No focal deficit present.     Mental Status: He is alert and oriented to person, place, and time.  Psychiatric:        Mood and Affect: Mood normal.        Behavior: Behavior normal.     LABORATORY DATA:  I have reviewed the labs as listed.  CBC    Component Value Date/Time   WBC 9.7 11/18/2020 1630   WBC 9.5 08/11/2020 1407   RBC 4.62 11/18/2020 1630   RBC 4.71 08/11/2020 1407   HGB 13.6 11/18/2020 1630   HCT 40.9 11/18/2020 1630   PLT 276 11/18/2020 1630   MCV 89 11/18/2020 1630   MCH 29.4 11/18/2020 1630   MCH 28.9 08/11/2020 1407   MCHC 33.3 11/18/2020 1630   MCHC 32.4 08/11/2020 1407   RDW 12.5 11/18/2020 1630   LYMPHSABS 3.7 (H) 11/18/2020 1630   MONOABS 0.5 08/25/2019 1036   EOSABS 0.7 (H) 11/18/2020 1630   BASOSABS 0.1 11/18/2020 1630   CMP Latest Ref Rng & Units 11/18/2020 08/11/2020 02/26/2020  Glucose 65 - 99 mg/dL 149(H) 197(H)  118(H)  BUN 8 - 27 mg/dL '15 14 18  '$ Creatinine 0.76 - 1.27 mg/dL 1.05 0.93 1.05  Sodium 134 - 144 mmol/L 140 141 140  Potassium 3.5 - 5.2 mmol/L 4.2 4.8 4.2  Chloride 96 - 106 mmol/L 101 104 102  CO2 20 - 29  mmol/L '22 27 20  '$ Calcium 8.6 - 10.2 mg/dL 9.5 9.4 9.3  Total Protein 6.0 - 8.5 g/dL 6.9 - 7.0  Total Bilirubin 0.0 - 1.2 mg/dL 0.3 - 0.3  Alkaline Phos 44 - 121 IU/L 80 - 67  AST 0 - 40 IU/L 24 - 21  ALT 0 - 44 IU/L 22 - 20    DIAGNOSTIC IMAGING:  I have independently reviewed the relevant imaging and discussed with the patient.  ASSESSMENT & PLAN: 1.  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 - He does not report any bleeding per rectum or melena, but recently had a positive Cologuard test and has an upcoming appointment with gastroenterology - He wants to put off his screening colonoscopy until after COVID-19 is cleared completely.  (Last colonoscopy was in 2010 at the [facility] - results not available) - He has been taking oral ferrous sulfate daily  - Energy levels are lower than usual, has some mild fatigue (70%) - Most recent iron panel (12/15/2020) shows iron deficiency with ferritin 12, iron saturation 20%.  Last CBC (11/18/2020) showed normal hemoglobin 13.6 - Suspect malabsorption in the setting of chronic disease and inflammation, possibly in the setting of acid blocking pill for GERD, which he takes 3 times per week - PLAN: Due to persistent iron deficiency despite greater than 6 months of oral supplementation, as well as symptoms of fatigue, recommend IV iron with Feraheme x2.  We will repeat labs and phone visit in 6 months.  2.  B12 deficiency: - His last B12 level (06/09/2020) was borderline at 210, and he was recommended to start taking B12 1 mg tablet daily.  Patient reports he did not remember that he was supposed to take B12, therefore has not been taking it. - Most recent labs (12/15/2020):  B12 remains borderline at 184 - PLAN:  Encouraged compliance with daily B12 tablet.  Will recheck B12 and MMA in 6 months.  3.  Rheumatoid arthritis: - His arthritis involves small joints of the hands and  right shoulder.   - He is on Orencia every 4 weeks.   4.  Tobacco abuse: - He quit smoking in 1992.  Smoked 1.5 pack/day for 20 years. - He is not eligible for LDCT (quit > 15 years ago)   PLAN SUMMARY & DISPOSITION: -Feraheme x2 - Labs in 6 months - RTC for phone visit after labs  All questions were answered. The patient knows to call the clinic with any problems, questions or concerns.  Medical decision making: Low  Time spent on visit: I spent 20 minutes counseling the patient face to face. The total time spent in the appointment was 30 minutes and more than 50% was on counseling.   Harriett Rush, PA-C  12/22/2020 8:52 AM

## 2020-12-22 ENCOUNTER — Other Ambulatory Visit: Payer: Self-pay

## 2020-12-22 ENCOUNTER — Inpatient Hospital Stay (HOSPITAL_BASED_OUTPATIENT_CLINIC_OR_DEPARTMENT_OTHER): Payer: Medicare Other | Admitting: Physician Assistant

## 2020-12-22 VITALS — BP 153/80 | HR 60 | Temp 97.3°F | Resp 19 | Wt 241.5 lb

## 2020-12-22 DIAGNOSIS — Z87891 Personal history of nicotine dependence: Secondary | ICD-10-CM | POA: Diagnosis not present

## 2020-12-22 DIAGNOSIS — E538 Deficiency of other specified B group vitamins: Secondary | ICD-10-CM

## 2020-12-22 DIAGNOSIS — D508 Other iron deficiency anemias: Secondary | ICD-10-CM | POA: Diagnosis not present

## 2020-12-22 DIAGNOSIS — D509 Iron deficiency anemia, unspecified: Secondary | ICD-10-CM | POA: Diagnosis not present

## 2020-12-22 DIAGNOSIS — M069 Rheumatoid arthritis, unspecified: Secondary | ICD-10-CM | POA: Diagnosis not present

## 2020-12-22 NOTE — Patient Instructions (Signed)
[facility] at [facility] Discharge Instructions  You were seen today by Tarri Abernethy PA-C for your IRON DEFICIENCY.  Your iron levels remain low despite taking your iron pill daily.  We recommend IV iron infusion (Feraheme) x2 doses.  The biggest side effect to be aware of is the potential for rare but serious allergic reactions.  We will watch you for about half an hour after the infusion to monitor for any signs of reaction.  Your B12 levels are also low.  Recommend that you start taking over-the-counter B12 (cyanocobalamin) 1000 units daily.  We will recheck your vitamin B12 levels at your next visit.  LABS: Return in 6 months for repeat labs  OTHER TESTS: None at this time  MEDICATIONS: Start taking B12 daily  FOLLOW-UP APPOINTMENT: Phone visit after labs in 6 months   Thank you for choosing [facility] at [facility] to provide your oncology and hematology care.  To afford each patient quality time with our provider, please arrive at least 15 minutes before your scheduled appointment time.   If you have a lab appointment with the [facility] please come in thru the Main Entrance and check in at the main information desk.  You need to re-schedule your appointment should you arrive 10 or more minutes late.  We strive to give you quality time with our providers, and arriving late affects you and other patients whose appointments are after yours.  Also, if you no show three or more times for appointments you may be dismissed from the clinic at the providers discretion.     Again, thank you for choosing [facility].  Our hope is that these requests will decrease the amount of time that you wait before being seen by our physicians.       _____________________________________________________________  Should you have questions after your visit to [facility], please contact our office at [phone] and follow  the prompts.  Our office hours are 8:00 a.m. and 4:30 p.m. Monday - Friday.  Please note that voicemails left after 4:00 p.m. may not be returned until the following business day.  We are closed weekends and major holidays.  You do have access to a nurse 24-7, just call the main number to the clinic [phone] and do not press any options, hold on the line and a nurse will answer the phone.    For prescription refill requests, have your pharmacy contact our office and allow 72 hours.    Due to Covid, you will need to wear a mask upon entering the hospital. If you do not have a mask, a mask will be given to you at the Main Entrance upon arrival. For doctor visits, patients may have 1 support person age 29 or older with them. For treatment visits, patients can not have anyone with them due to social distancing guidelines and our immunocompromised population.

## 2020-12-26 ENCOUNTER — Other Ambulatory Visit: Payer: Self-pay

## 2020-12-26 ENCOUNTER — Inpatient Hospital Stay (HOSPITAL_COMMUNITY): Payer: Medicare Other

## 2020-12-26 VITALS — BP 117/65 | HR 67 | Temp 98.2°F | Resp 18

## 2020-12-26 DIAGNOSIS — Z87891 Personal history of nicotine dependence: Secondary | ICD-10-CM | POA: Diagnosis not present

## 2020-12-26 DIAGNOSIS — M069 Rheumatoid arthritis, unspecified: Secondary | ICD-10-CM | POA: Diagnosis not present

## 2020-12-26 DIAGNOSIS — E538 Deficiency of other specified B group vitamins: Secondary | ICD-10-CM | POA: Diagnosis not present

## 2020-12-26 DIAGNOSIS — D509 Iron deficiency anemia, unspecified: Secondary | ICD-10-CM | POA: Diagnosis not present

## 2020-12-26 MED ORDER — SODIUM CHLORIDE 0.9 % IV SOLN
Freq: Once | INTRAVENOUS | Status: AC
Start: 2020-12-26 — End: 2020-12-26

## 2020-12-26 MED ORDER — METHYLPREDNISOLONE SODIUM SUCC 125 MG IJ SOLR
125.0000 mg | Freq: Once | INTRAMUSCULAR | Status: AC
Start: 2020-12-26 — End: 2020-12-26
  Administered 2020-12-26: 125 mg via INTRAVENOUS
  Filled 2020-12-26: qty 2

## 2020-12-26 MED ORDER — SODIUM CHLORIDE 0.9 % IV SOLN
510.0000 mg | Freq: Once | INTRAVENOUS | Status: AC
  Administered 2020-12-26: 510 mg via INTRAVENOUS
  Filled 2020-12-26: qty 510

## 2020-12-26 MED ORDER — LORATADINE 10 MG PO TABS
10.0000 mg | ORAL_TABLET | Freq: Once | ORAL | Status: AC
  Administered 2020-12-26: 10 mg via ORAL
  Filled 2020-12-26: qty 1

## 2020-12-26 MED ORDER — ACETAMINOPHEN 325 MG PO TABS
650.0000 mg | ORAL_TABLET | Freq: Once | ORAL | Status: AC
  Administered 2020-12-26: 650 mg via ORAL
  Filled 2020-12-26: qty 2

## 2020-12-26 NOTE — Patient Instructions (Signed)
[facility]  Discharge Instructions: Thank you for choosing [facility] to provide your oncology and hematology care.  If you have a lab appointment with the Cancer Center, please come in thru the Main Entrance and check in at the main information desk.  Wear comfortable clothing and clothing appropriate for easy access to any Portacath or PICC line.   We strive to give you quality time with your provider. You may need to reschedule your appointment if you arrive late (15 or more minutes).  Arriving late affects you and other patients whose appointments are after yours.  Also, if you miss three or more appointments without notifying the office, you may be dismissed from the clinic at the provider's discretion.      For prescription refill requests, have your pharmacy contact our office and allow 72 hours for refills to be completed.    Today you received Feraheme IV iron infusion.     BELOW ARE SYMPTOMS THAT SHOULD BE REPORTED IMMEDIATELY: *FEVER GREATER THAN 100.4 F (38 C) OR HIGHER *CHILLS OR SWEATING *NAUSEA AND VOMITING THAT IS NOT CONTROLLED WITH YOUR NAUSEA MEDICATION *UNUSUAL SHORTNESS OF BREATH *UNUSUAL BRUISING OR BLEEDING *URINARY PROBLEMS (pain or burning when urinating, or frequent urination) *BOWEL PROBLEMS (unusual diarrhea, constipation, pain near the anus) TENDERNESS IN MOUTH AND THROAT WITH OR WITHOUT PRESENCE OF ULCERS (sore throat, sores in mouth, or a toothache) UNUSUAL RASH, SWELLING OR PAIN  UNUSUAL VAGINAL DISCHARGE OR ITCHING   Items with * indicate a potential emergency and should be followed up as soon as possible or go to the Emergency Department if any problems should occur.  Please show the CHEMOTHERAPY ALERT CARD or IMMUNOTHERAPY ALERT CARD at check-in to the Emergency Department and triage nurse.  Should you have questions after your visit or need to cancel or reschedule your appointment, please contact [facility]  [phone]  and follow the prompts.  Office hours are 8:00 a.m. to 4:30 p.m. Monday - Friday. Please note that voicemails left after 4:00 p.m. may not be returned until the following business day.  We are closed weekends and major holidays. You have access to a nurse at all times for urgent questions. Please call the main number to the clinic [phone] and follow the prompts.  For any non-urgent questions, you may also contact your provider using MyChart. We now offer e-Visits for anyone 18 and older to request care online for non-urgent symptoms. For details visit [email or web address].   Also download the MyChart app! Go to the app store, search "MyChart", open the app, select [facility], and log in with your MyChart username and password.  Due to Covid, a mask is required upon entering the hospital/clinic. If you do not have a mask, one will be given to you upon arrival. For doctor visits, patients may have 1 support person aged 18 or older with them. For treatment visits, patients cannot have anyone with them due to current Covid guidelines and our immunocompromised population.  

## 2020-12-26 NOTE — Progress Notes (Signed)
Pt presents today for Feraheme IV iron infusion per provider's order. Vital signs stable and pt voiced no new complaints at this time.  Peripheral IV started with good blood return pre and post infusion.  Feraheme given today per MD orders. Tolerated infusion without adverse affects. Vital signs stable. No complaints at this time. Discharged from clinic ambulatory in stable condition. Alert and oriented x 3. F/U with [facility] as scheduled.    

## 2021-01-02 ENCOUNTER — Other Ambulatory Visit: Payer: Self-pay

## 2021-01-02 ENCOUNTER — Inpatient Hospital Stay (HOSPITAL_COMMUNITY): Payer: Medicare Other

## 2021-01-02 VITALS — BP 137/79 | HR 70 | Temp 97.5°F | Resp 18

## 2021-01-02 DIAGNOSIS — D509 Iron deficiency anemia, unspecified: Secondary | ICD-10-CM

## 2021-01-02 DIAGNOSIS — M069 Rheumatoid arthritis, unspecified: Secondary | ICD-10-CM | POA: Diagnosis not present

## 2021-01-02 DIAGNOSIS — E538 Deficiency of other specified B group vitamins: Secondary | ICD-10-CM | POA: Diagnosis not present

## 2021-01-02 DIAGNOSIS — Z87891 Personal history of nicotine dependence: Secondary | ICD-10-CM | POA: Diagnosis not present

## 2021-01-02 MED ORDER — SODIUM CHLORIDE 0.9 % IV SOLN
510.0000 mg | Freq: Once | INTRAVENOUS | Status: AC
  Administered 2021-01-02: 510 mg via INTRAVENOUS
  Filled 2021-01-02: qty 510

## 2021-01-02 MED ORDER — ACETAMINOPHEN 325 MG PO TABS
650.0000 mg | ORAL_TABLET | Freq: Once | ORAL | Status: AC
  Administered 2021-01-02: 650 mg via ORAL
  Filled 2021-01-02: qty 2

## 2021-01-02 MED ORDER — METHYLPREDNISOLONE SODIUM SUCC 125 MG IJ SOLR
125.0000 mg | Freq: Once | INTRAMUSCULAR | Status: AC
  Administered 2021-01-02: 125 mg via INTRAVENOUS
  Filled 2021-01-02: qty 2

## 2021-01-02 MED ORDER — LORATADINE 10 MG PO TABS
10.0000 mg | ORAL_TABLET | Freq: Once | ORAL | Status: AC
  Administered 2021-01-02: 10 mg via ORAL
  Filled 2021-01-02: qty 1

## 2021-01-02 MED ORDER — SODIUM CHLORIDE 0.9 % IV SOLN: Freq: Once | INTRAVENOUS | Status: AC

## 2021-01-02 NOTE — Patient Instructions (Signed)
[facility]  Discharge Instructions: Thank you for choosing [facility] to provide your oncology and hematology care.  If you have a lab appointment with the Cancer Center, please come in thru the Main Entrance and check in at the main information desk.  Wear comfortable clothing and clothing appropriate for easy access to any Portacath or PICC line.   We strive to give you quality time with your provider. You may need to reschedule your appointment if you arrive late (15 or more minutes).  Arriving late affects you and other patients whose appointments are after yours.  Also, if you miss three or more appointments without notifying the office, you may be dismissed from the clinic at the provider's discretion.      For prescription refill requests, have your pharmacy contact our office and allow 72 hours for refills to be completed.    Today you received Feraheme IV iron infusion.     BELOW ARE SYMPTOMS THAT SHOULD BE REPORTED IMMEDIATELY: *FEVER GREATER THAN 100.4 F (38 C) OR HIGHER *CHILLS OR SWEATING *NAUSEA AND VOMITING THAT IS NOT CONTROLLED WITH YOUR NAUSEA MEDICATION *UNUSUAL SHORTNESS OF BREATH *UNUSUAL BRUISING OR BLEEDING *URINARY PROBLEMS (pain or burning when urinating, or frequent urination) *BOWEL PROBLEMS (unusual diarrhea, constipation, pain near the anus) TENDERNESS IN MOUTH AND THROAT WITH OR WITHOUT PRESENCE OF ULCERS (sore throat, sores in mouth, or a toothache) UNUSUAL RASH, SWELLING OR PAIN  UNUSUAL VAGINAL DISCHARGE OR ITCHING   Items with * indicate a potential emergency and should be followed up as soon as possible or go to the Emergency Department if any problems should occur.  Please show the CHEMOTHERAPY ALERT CARD or IMMUNOTHERAPY ALERT CARD at check-in to the Emergency Department and triage nurse.  Should you have questions after your visit or need to cancel or reschedule your appointment, please contact [facility]  [phone]  and follow the prompts.  Office hours are 8:00 a.m. to 4:30 p.m. Monday - Friday. Please note that voicemails left after 4:00 p.m. may not be returned until the following business day.  We are closed weekends and major holidays. You have access to a nurse at all times for urgent questions. Please call the main number to the clinic [phone] and follow the prompts.  For any non-urgent questions, you may also contact your provider using MyChart. We now offer e-Visits for anyone 18 and older to request care online for non-urgent symptoms. For details visit [email or web address].   Also download the MyChart app! Go to the app store, search "MyChart", open the app, select [facility], and log in with your MyChart username and password.  Due to Covid, a mask is required upon entering the hospital/clinic. If you do not have a mask, one will be given to you upon arrival. For doctor visits, patients may have 1 support person aged 18 or older with them. For treatment visits, patients cannot have anyone with them due to current Covid guidelines and our immunocompromised population.  

## 2021-01-02 NOTE — Progress Notes (Signed)
Pt presents today for Fereheme IV iron infusion per provider's order. Vital signs stable and pt voiced no new complaints at this time.  Peripheral IV started with good blood return pre and post infusion.  Feraheme given today per MD orders. Tolerated infusion without adverse affects. Vital signs stable. No complaints at this time. Discharged from clinic ambulatory in stable condition. Alert and oriented x 3. F/U with [facility] as scheduled.

## 2021-01-09 ENCOUNTER — Other Ambulatory Visit: Payer: Self-pay | Admitting: Family

## 2021-01-09 DIAGNOSIS — E1165 Type 2 diabetes mellitus with hyperglycemia: Secondary | ICD-10-CM

## 2021-03-21 ENCOUNTER — Other Ambulatory Visit: Payer: Self-pay | Admitting: Family

## 2021-03-21 DIAGNOSIS — E1159 Type 2 diabetes mellitus with other circulatory complications: Secondary | ICD-10-CM

## 2021-03-22 ENCOUNTER — Encounter: Payer: Self-pay | Admitting: Gastroenterology

## 2021-03-22 ENCOUNTER — Other Ambulatory Visit: Payer: Self-pay

## 2021-03-22 ENCOUNTER — Ambulatory Visit (INDEPENDENT_AMBULATORY_CARE_PROVIDER_SITE_OTHER): Payer: Medicare Other | Admitting: Gastroenterology

## 2021-03-22 DIAGNOSIS — R195 Other fecal abnormalities: Secondary | ICD-10-CM | POA: Insufficient documentation

## 2021-03-22 DIAGNOSIS — D509 Iron deficiency anemia, unspecified: Secondary | ICD-10-CM | POA: Insufficient documentation

## 2021-03-22 NOTE — H&P (View-Only) (Signed)
Primary Care Physician:  Sharion Balloon, FNP  Primary Gastroenterologist:  Elon Alas. Abbey Chatters, DO   Chief Complaint  Patient presents with   positive cologuard    Never had tcs    HPI:  [name] is a 65 y.o. male here for further evaluation of positive Cologuard at the request of Evelina Dun, [place].  He has a history of IDA, followed by hematology.  Receiving intermittent IV iron with last infusion in September 2022 but prior to that his last infusions were in October 2020.  Multiple heme negative stools in 2020 at time of diagnosis. Colonoscopy has been recommended previously but he wanted to put off until COVID cleared completely.  Most recent labs in September with ferritin of 12, iron saturations 20%.  Hemoglobin normal at 13.6 in August.  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  He has a history of RA.  Also with borderline B12 at 210 back in March, recently starting B12 orally.  Patient has never had a colonoscopy.  He denies any bowel concerns.  Sometimes stool frequency increased due to metformin.  No significant diarrhea.  No blood in the stool or melena.  Denies any abdominal pain.  No unintentional weight loss.  States his acid reflux got a lot better after he quit smoking several years ago.  He takes over-the-counter omeprazole 2-3 times per week to prevent reflux.  Denies dysphagia.  No vomiting.    Orencia infusion for RA once per month.   Current Outpatient Medications  Medication Sig Dispense Refill   aspirin 81 MG chewable tablet Chew 81 mg by mouth daily.     glimepiride (AMARYL) 2 MG tablet Take 1 tablet (2 mg total) by mouth daily. (Patient taking differently: Take 4 mg by mouth daily.) 90 tablet 2   hydrochlorothiazide (MICROZIDE) 12.5 MG capsule TAKE (1) CAPSULE DAILY 90 capsule 1   ketoconazole (NIZORAL) 2 % cream Apply 1 application topically daily. 15 g 0   losartan (COZAAR) 100 MG tablet TAKE 1  TABLET DAILY 90 tablet 0   meloxicam (MOBIC) 15 MG tablet TAKE 1 TABELT ONCE DAILY 90 tablet 0   metFORMIN (GLUCOPHAGE) 1000 MG tablet TAKE 1 TABLET IN THE MORNING AND 1&1/2 TABLETS IN THE EVENING 225 tablet 0   Multiple Vitamins-Minerals (MULTI FOR HIM) TABS Take 1 tablet by mouth daily.     naloxone (NARCAN) nasal spray 4 mg/0.1 mL Place 1 spray into the nose as needed (opioid overdose).     oxyCODONE-acetaminophen (PERCOCET) 10-325 MG tablet Take 1-2 tablets by mouth every 6 (six) hours as needed for pain. MAXIMUM TOTAL ACETAMINOPHEN DOSE IS 4000 MG PER DAY 50 tablet 0   simvastatin (ZOCOR) 10 MG tablet TAKE 1 TABLET DAILY 90 tablet 1   ondansetron (ZOFRAN) 4 MG tablet Take 1 tablet (4 mg total) by mouth every 8 (eight) hours as needed for nausea or vomiting. 10 tablet 0   No current facility-administered medications for this visit.    Allergies as of 03/22/2021 - Review Complete 03/22/2021  Allergen Reaction Noted   Shellfish allergy Shortness Of Breath and Swelling 03/05/2011   Leflunomide  01/21/2017   Methotrexate  01/21/2017   Canagliflozin Other (See Comments) 12/09/2014   Cephalexin Other (See Comments) 03/05/2011   Doxycycline Nausea And Vomiting 11/03/2015   Dulaglutide Diarrhea and Nausea Only 11/06/2016    Past Medical History:  Diagnosis Date   Anemia    taking iron   Arthritis  rheumatoid arthritis   Diabetes mellitus    Heart murmur    Hyperlipidemia    Hypertension    Primary localized osteoarthritis of right knee 05/13/2015   Scapho-lunate dissociation, right    Vitamin D deficiency     Past Surgical History:  Procedure Laterality Date   CARDIAC CATHETERIZATION  2005   normal   CARPAL TUNNEL RELEASE  2009   both hands   HARDWARE REMOVAL Right 11/14/2017   Procedure: REMOVAL ACCUTRAC SCREW RIGHT WRIST;  Surgeon: Daryll Brod, MD;  Location: [facility];  Service: Orthopedics;  Laterality: Right;   KNEE ARTHROSCOPY     rt   PARTIAL KNEE  ARTHROPLASTY Right 05/13/2015   Procedure: RIGHT UNI KNEE ARTHROPLASTY;  Surgeon: Marchia Bond, MD;  Location: [facility];  Service: Orthopedics;  Laterality: Right;   scaphoid excision Left 2012   Dr Daylene Katayama   TONSILLECTOMY     as a child   TOTAL SHOULDER ARTHROPLASTY Right 08/23/2020   Procedure: TOTAL SHOULDER ARTHROPLASTY;  Surgeon: Marchia Bond, MD;  Location: [facility];  Service: Orthopedics;  Laterality: Right;   WRIST FUSION Right 03/14/2017   Procedure: SCAPHOID EXCISION ULNAR FOUR BONE FUSION RIGHT;  Surgeon: Daryll Brod, MD;  Location: [facility];  Service: Orthopedics;  Laterality: Right;    Family History  Problem Relation Age of Onset   Diabetes Father    Hypertension Father    Heart Problems Father    Stroke Father    Arthritis Sister    Arthritis Sister    Diabetes Maternal Grandmother    Arthritis Daughter    Colon cancer Neg Hx     Social History   Socioeconomic History   Marital status: Married    Spouse name: Helene Kelp   Number of children: 2   Years of education: Not on file   Highest education level: Not on file  Occupational History   Occupation: Disabled  Tobacco Use   Smoking status: Former    Types: Cigarettes    Quit date: 03/05/1991    Years since quitting: 30.0   Smokeless tobacco: Never  Vaping Use   Vaping Use: Never used  Substance and Sexual Activity   Alcohol use: Yes    Comment: rare   Drug use: No   Sexual activity: Not on file  Other Topics Concern   Not on file  Social History Narrative   Children live in [place] Resource Strain: Low Risk    Difficulty of Paying Living Expenses: Not hard at all  Food Insecurity: No Food Insecurity   Worried About Charity fundraiser in the Last Year: Never true   [place] in the Last Year: Never true  Transportation Needs: No Transportation Needs   Lack of Transportation (Medical): No   Lack of Transportation  (Non-Medical): No  Physical Activity: Inactive   Days of Exercise per Week: 0 days   Minutes of Exercise per Session: 0 min  Stress: No Stress Concern Present   Feeling of Stress : Not at all  Social Connections: Socially Integrated   Frequency of Communication with Friends and Family: More than three times a week   Frequency of Social Gatherings with Friends and Family: More than three times a week   Attends Religious Services: 1 to 4 times per year   Active Member of [employer] or Organizations: Yes   Attends Archivist Meetings: 1 to 4  times per year   Marital Status: Married  Human resources officer Violence: Not At Risk   Fear of Current or Ex-Partner: No   Emotionally Abused: No   Physically Abused: No   Sexually Abused: No      ROS:  General: Negative for anorexia, unintentional weight loss, fever, chills, fatigue, weakness. Eyes: Negative for vision changes.  ENT: Negative for hoarseness, difficulty swallowing , nasal congestion. CV: Negative for chest pain, angina, palpitations, dyspnea on exertion, peripheral edema.  Respiratory: Negative for dyspnea at rest, dyspnea on exertion, cough, sputum, wheezing.  GI: See history of present illness. GU:  Negative for dysuria, hematuria, urinary incontinence, urinary frequency, nocturnal urination.  MS: Positive back pain  Derm: Negative for rash or itching.  Neuro: Negative for weakness, abnormal sensation, seizure, frequent headaches, memory loss, confusion.  Psych: Negative for anxiety, depression, suicidal ideation, hallucinations.  Endo: Negative for unusual weight change.  Heme: Negative for bruising or bleeding. Allergy: Negative for rash or hives.    Physical Examination:  BP (!) 155/85    Pulse 78    Temp (!) 97.3 F (36.3 C) (Temporal)    Ht 5\' 8"  (1.727 m)    Wt 239 lb 9.6 oz (108.7 kg)    BMI 36.43 kg/m    General: Well-nourished, well-developed in no acute distress.  Head: Normocephalic, atraumatic.   Eyes:  Conjunctiva pink, no icterus. Mouth: masked Neck: Supple without thyromegaly, masses, or lymphadenopathy.  Lungs: Clear to auscultation bilaterally.  Heart: Regular rate and rhythm, no murmurs rubs or gallops.  Abdomen: Bowel sounds are normal, nontender, nondistended, no hepatosplenomegaly or masses, no abdominal bruits or    hernia , no rebound or guarding.   Rectal: not performed Extremities: No lower extremity edema. No clubbing or deformities.  Neuro: Alert and oriented x 4 , grossly normal neurologically.  Skin: Warm and dry, no rash or jaundice.   Psych: Alert and cooperative, normal mood and affect.  Labs: Lab Results  Component Value Date   CREATININE 1.05 11/18/2020   BUN 15 11/18/2020   NA 140 11/18/2020   K 4.2 11/18/2020   CL 101 11/18/2020   CO2 22 11/18/2020   Lab Results  Component Value Date   ALT 22 11/18/2020   AST 24 11/18/2020   ALKPHOS 80 11/18/2020   BILITOT 0.3 11/18/2020   Lab Results  Component Value Date   WBC 9.7 11/18/2020   HGB 13.6 11/18/2020   HCT 40.9 11/18/2020   MCV 89 11/18/2020   PLT 276 11/18/2020   Lab Results  Component Value Date   IRON 89 12/15/2020   TIBC 440 12/15/2020   FERRITIN 12 (L) 12/15/2020   Lab Results  Component Value Date   VITAMINB12 184 12/15/2020   Lab Results  Component Value Date   FOLATE 7.5 08/25/2019     Imaging Studies: No results found.   Assessment:  Very pleasant 65 year old male with history of rheumatoid arthritis on Orencia, diabetes, iron deficiency anemia (previously with multiple heme negative stools at time of diagnosis in 2020) receiving intermittent iron infusions infusions presenting for further evaluation of positive Cologuard.  Patient denies any GI symptoms.  No family history of colon cancer.  No prior colonoscopy.  Recommend colonoscopy for further evaluation of IDA and positive Cologuard.  Based on findings he may need to have further evaluation of IDA.  Plan:  Colonoscopy  with Dr. Abbey Chatters.  ASA 2.  I have discussed the risks, alternatives, benefits with regards to but not  limited to the risk of reaction to medication, bleeding, infection, perforation and the patient is agreeable to proceed. Written consent to be obtained.

## 2021-03-22 NOTE — Progress Notes (Signed)
Primary Care Physician:  Sharion Balloon, FNP  Primary Gastroenterologist:  Elon Alas. Abbey Chatters, DO   Chief Complaint  Patient presents with   positive cologuard    Never had tcs    HPI:  [name] is a 65 y.o. male here for further evaluation of positive Cologuard at the request of Evelina Dun, [place].  He has a history of IDA, followed by hematology.  Receiving intermittent IV iron with last infusion in September 2022 but prior to that his last infusions were in October 2020.  Multiple heme negative stools in 2020 at time of diagnosis. Colonoscopy has been recommended previously but he wanted to put off until COVID cleared completely.  Most recent labs in September with ferritin of 12, iron saturations 20%.  Hemoglobin normal at 13.6 in August.  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  He has a history of RA.  Also with borderline B12 at 210 back in March, recently starting B12 orally.  Patient has never had a colonoscopy.  He denies any bowel concerns.  Sometimes stool frequency increased due to metformin.  No significant diarrhea.  No blood in the stool or melena.  Denies any abdominal pain.  No unintentional weight loss.  States his acid reflux got a lot better after he quit smoking several years ago.  He takes over-the-counter omeprazole 2-3 times per week to prevent reflux.  Denies dysphagia.  No vomiting.    Orencia infusion for RA once per month.   Current Outpatient Medications  Medication Sig Dispense Refill   aspirin 81 MG chewable tablet Chew 81 mg by mouth daily.     glimepiride (AMARYL) 2 MG tablet Take 1 tablet (2 mg total) by mouth daily. (Patient taking differently: Take 4 mg by mouth daily.) 90 tablet 2   hydrochlorothiazide (MICROZIDE) 12.5 MG capsule TAKE (1) CAPSULE DAILY 90 capsule 1   ketoconazole (NIZORAL) 2 % cream Apply 1 application topically daily. 15 g 0   losartan (COZAAR) 100 MG tablet TAKE 1  TABLET DAILY 90 tablet 0   meloxicam (MOBIC) 15 MG tablet TAKE 1 TABELT ONCE DAILY 90 tablet 0   metFORMIN (GLUCOPHAGE) 1000 MG tablet TAKE 1 TABLET IN THE MORNING AND 1&1/2 TABLETS IN THE EVENING 225 tablet 0   Multiple Vitamins-Minerals (MULTI FOR HIM) TABS Take 1 tablet by mouth daily.     naloxone (NARCAN) nasal spray 4 mg/0.1 mL Place 1 spray into the nose as needed (opioid overdose).     oxyCODONE-acetaminophen (PERCOCET) 10-325 MG tablet Take 1-2 tablets by mouth every 6 (six) hours as needed for pain. MAXIMUM TOTAL ACETAMINOPHEN DOSE IS 4000 MG PER DAY 50 tablet 0   simvastatin (ZOCOR) 10 MG tablet TAKE 1 TABLET DAILY 90 tablet 1   ondansetron (ZOFRAN) 4 MG tablet Take 1 tablet (4 mg total) by mouth every 8 (eight) hours as needed for nausea or vomiting. 10 tablet 0   No current facility-administered medications for this visit.    Allergies as of 03/22/2021 - Review Complete 03/22/2021  Allergen Reaction Noted   Shellfish allergy Shortness Of Breath and Swelling 03/05/2011   Leflunomide  01/21/2017   Methotrexate  01/21/2017   Canagliflozin Other (See Comments) 12/09/2014   Cephalexin Other (See Comments) 03/05/2011   Doxycycline Nausea And Vomiting 11/03/2015   Dulaglutide Diarrhea and Nausea Only 11/06/2016    Past Medical History:  Diagnosis Date   Anemia    taking iron   Arthritis  rheumatoid arthritis   Diabetes mellitus    Heart murmur    Hyperlipidemia    Hypertension    Primary localized osteoarthritis of right knee 05/13/2015   Scapho-lunate dissociation, right    Vitamin D deficiency     Past Surgical History:  Procedure Laterality Date   CARDIAC CATHETERIZATION  2005   normal   CARPAL TUNNEL RELEASE  2009   both hands   HARDWARE REMOVAL Right 11/14/2017   Procedure: REMOVAL ACCUTRAC SCREW RIGHT WRIST;  Surgeon: Daryll Brod, MD;  Location: [facility];  Service: Orthopedics;  Laterality: Right;   KNEE ARTHROSCOPY     rt   PARTIAL KNEE  ARTHROPLASTY Right 05/13/2015   Procedure: RIGHT UNI KNEE ARTHROPLASTY;  Surgeon: Marchia Bond, MD;  Location: [facility];  Service: Orthopedics;  Laterality: Right;   scaphoid excision Left 2012   Dr Daylene Katayama   TONSILLECTOMY     as a child   TOTAL SHOULDER ARTHROPLASTY Right 08/23/2020   Procedure: TOTAL SHOULDER ARTHROPLASTY;  Surgeon: Marchia Bond, MD;  Location: [facility];  Service: Orthopedics;  Laterality: Right;   WRIST FUSION Right 03/14/2017   Procedure: SCAPHOID EXCISION ULNAR FOUR BONE FUSION RIGHT;  Surgeon: Daryll Brod, MD;  Location: [facility];  Service: Orthopedics;  Laterality: Right;    Family History  Problem Relation Age of Onset   Diabetes Father    Hypertension Father    Heart Problems Father    Stroke Father    Arthritis Sister    Arthritis Sister    Diabetes Maternal Grandmother    Arthritis Daughter    Colon cancer Neg Hx     Social History   Socioeconomic History   Marital status: Married    Spouse name: Helene Kelp   Number of children: 2   Years of education: Not on file   Highest education level: Not on file  Occupational History   Occupation: Disabled  Tobacco Use   Smoking status: Former    Types: Cigarettes    Quit date: 03/05/1991    Years since quitting: 30.0   Smokeless tobacco: Never  Vaping Use   Vaping Use: Never used  Substance and Sexual Activity   Alcohol use: Yes    Comment: rare   Drug use: No   Sexual activity: Not on file  Other Topics Concern   Not on file  Social History Narrative   Children live in [place] Resource Strain: Low Risk    Difficulty of Paying Living Expenses: Not hard at all  Food Insecurity: No Food Insecurity   Worried About Charity fundraiser in the Last Year: Never true   [place] in the Last Year: Never true  Transportation Needs: No Transportation Needs   Lack of Transportation (Medical): No   Lack of Transportation  (Non-Medical): No  Physical Activity: Inactive   Days of Exercise per Week: 0 days   Minutes of Exercise per Session: 0 min  Stress: No Stress Concern Present   Feeling of Stress : Not at all  Social Connections: Socially Integrated   Frequency of Communication with Friends and Family: More than three times a week   Frequency of Social Gatherings with Friends and Family: More than three times a week   Attends Religious Services: 1 to 4 times per year   Active Member of [employer] or Organizations: Yes   Attends Archivist Meetings: 1 to 4  times per year   Marital Status: Married  Human resources officer Violence: Not At Risk   Fear of Current or Ex-Partner: No   Emotionally Abused: No   Physically Abused: No   Sexually Abused: No      ROS:  General: Negative for anorexia, unintentional weight loss, fever, chills, fatigue, weakness. Eyes: Negative for vision changes.  ENT: Negative for hoarseness, difficulty swallowing , nasal congestion. CV: Negative for chest pain, angina, palpitations, dyspnea on exertion, peripheral edema.  Respiratory: Negative for dyspnea at rest, dyspnea on exertion, cough, sputum, wheezing.  GI: See history of present illness. GU:  Negative for dysuria, hematuria, urinary incontinence, urinary frequency, nocturnal urination.  MS: Positive back pain  Derm: Negative for rash or itching.  Neuro: Negative for weakness, abnormal sensation, seizure, frequent headaches, memory loss, confusion.  Psych: Negative for anxiety, depression, suicidal ideation, hallucinations.  Endo: Negative for unusual weight change.  Heme: Negative for bruising or bleeding. Allergy: Negative for rash or hives.    Physical Examination:  BP (!) 155/85    Pulse 78    Temp (!) 97.3 F (36.3 C) (Temporal)    Ht 5\' 8"  (1.727 m)    Wt 239 lb 9.6 oz (108.7 kg)    BMI 36.43 kg/m    General: Well-nourished, well-developed in no acute distress.  Head: Normocephalic, atraumatic.   Eyes:  Conjunctiva pink, no icterus. Mouth: masked Neck: Supple without thyromegaly, masses, or lymphadenopathy.  Lungs: Clear to auscultation bilaterally.  Heart: Regular rate and rhythm, no murmurs rubs or gallops.  Abdomen: Bowel sounds are normal, nontender, nondistended, no hepatosplenomegaly or masses, no abdominal bruits or    hernia , no rebound or guarding.   Rectal: not performed Extremities: No lower extremity edema. No clubbing or deformities.  Neuro: Alert and oriented x 4 , grossly normal neurologically.  Skin: Warm and dry, no rash or jaundice.   Psych: Alert and cooperative, normal mood and affect.  Labs: Lab Results  Component Value Date   CREATININE 1.05 11/18/2020   BUN 15 11/18/2020   NA 140 11/18/2020   K 4.2 11/18/2020   CL 101 11/18/2020   CO2 22 11/18/2020   Lab Results  Component Value Date   ALT 22 11/18/2020   AST 24 11/18/2020   ALKPHOS 80 11/18/2020   BILITOT 0.3 11/18/2020   Lab Results  Component Value Date   WBC 9.7 11/18/2020   HGB 13.6 11/18/2020   HCT 40.9 11/18/2020   MCV 89 11/18/2020   PLT 276 11/18/2020   Lab Results  Component Value Date   IRON 89 12/15/2020   TIBC 440 12/15/2020   FERRITIN 12 (L) 12/15/2020   Lab Results  Component Value Date   VITAMINB12 184 12/15/2020   Lab Results  Component Value Date   FOLATE 7.5 08/25/2019     Imaging Studies: No results found.   Assessment:  Very pleasant 65 year old male with history of rheumatoid arthritis on Orencia, diabetes, iron deficiency anemia (previously with multiple heme negative stools at time of diagnosis in 2020) receiving intermittent iron infusions infusions presenting for further evaluation of positive Cologuard.  Patient denies any GI symptoms.  No family history of colon cancer.  No prior colonoscopy.  Recommend colonoscopy for further evaluation of IDA and positive Cologuard.  Based on findings he may need to have further evaluation of IDA.  Plan:  Colonoscopy  with Dr. Abbey Chatters.  ASA 2.  I have discussed the risks, alternatives, benefits with regards to but not  limited to the risk of reaction to medication, bleeding, infection, perforation and the patient is agreeable to proceed. Written consent to be obtained.

## 2021-03-22 NOTE — Patient Instructions (Signed)
Colonoscopy to be scheduled. See separate instructions.  ?

## 2021-03-23 ENCOUNTER — Telehealth: Payer: Self-pay | Admitting: *Deleted

## 2021-03-23 DIAGNOSIS — D509 Iron deficiency anemia, unspecified: Secondary | ICD-10-CM

## 2021-03-23 DIAGNOSIS — R195 Other fecal abnormalities: Secondary | ICD-10-CM

## 2021-03-23 MED ORDER — PEG 3350-KCL-NA BICARB-NACL 420 G PO SOLR: ORAL | 0 refills | Status: DC

## 2021-03-23 NOTE — Telephone Encounter (Signed)
Called spoke with pt. He has been scheduled for TCS with Dr. Abbey Chatters, ASA 2 on 1/9 at 8:15am. Aware will mail prep instructions and sent prep to pharmacy.

## 2021-04-10 ENCOUNTER — Other Ambulatory Visit: Payer: Self-pay | Admitting: Family

## 2021-04-10 DIAGNOSIS — E1159 Type 2 diabetes mellitus with other circulatory complications: Secondary | ICD-10-CM

## 2021-04-10 DIAGNOSIS — E1165 Type 2 diabetes mellitus with hyperglycemia: Secondary | ICD-10-CM

## 2021-04-11 ENCOUNTER — Other Ambulatory Visit (HOSPITAL_COMMUNITY)
Admission: RE | Admit: 2021-04-11 | Discharge: 2021-04-11 | Disposition: A | Discharge status: Home / Self Care | Payer: Medicare Other | Source: Ambulatory Visit | Attending: Internal Medicine | Admitting: Internal Medicine

## 2021-04-11 ENCOUNTER — Other Ambulatory Visit: Payer: Self-pay

## 2021-04-11 DIAGNOSIS — R195 Other fecal abnormalities: Secondary | ICD-10-CM | POA: Insufficient documentation

## 2021-04-11 DIAGNOSIS — D509 Iron deficiency anemia, unspecified: Secondary | ICD-10-CM | POA: Insufficient documentation

## 2021-04-11 LAB — BASIC METABOLIC PANEL
Anion gap: 10 (ref 5–15)
BUN: 23 mg/dL (ref 8–23)
CO2: 28 mmol/L (ref 22–32)
Calcium: 9.1 mg/dL (ref 8.9–10.3)
Chloride: 99 mmol/L (ref 98–111)
Creatinine, Ser: 1.05 mg/dL (ref 0.61–1.24)
GFR, Estimated: >60 mL/min (ref >60)
Glucose, Bld: 136 mg/dL — ABNORMAL HIGH (ref 70–99)
Potassium: 4.3 mmol/L (ref 3.5–5.1)
Sodium: 137 mmol/L (ref 135–145)

## 2021-04-12 ENCOUNTER — Encounter (HOSPITAL_COMMUNITY)
Admission: RE | Admit: 2021-04-12 | Discharge: 2021-04-12 | Disposition: A | Discharge status: Home / Self Care | Payer: Medicare Other | Source: Ambulatory Visit | Attending: Internal Medicine | Admitting: Internal Medicine

## 2021-04-17 ENCOUNTER — Ambulatory Visit (HOSPITAL_COMMUNITY): Payer: Medicare Other | Admitting: Anesthesiology

## 2021-04-17 ENCOUNTER — Encounter (HOSPITAL_COMMUNITY): Payer: Self-pay

## 2021-04-17 ENCOUNTER — Encounter (HOSPITAL_COMMUNITY)
Admission: RE | Disposition: A | Discharge status: Home / Self Care | Payer: Self-pay | Source: Home / Self Care | Attending: Internal Medicine

## 2021-04-17 ENCOUNTER — Ambulatory Visit (HOSPITAL_COMMUNITY)
Admission: RE | Admit: 2021-04-17 | Discharge: 2021-04-17 | Disposition: A | Discharge status: Home / Self Care | Payer: Medicare Other | Attending: Internal Medicine | Admitting: Internal Medicine

## 2021-04-17 DIAGNOSIS — K5730 Diverticulosis of large intestine without perforation or abscess without bleeding: Secondary | ICD-10-CM | POA: Insufficient documentation

## 2021-04-17 DIAGNOSIS — Z79899 Other long term (current) drug therapy: Secondary | ICD-10-CM | POA: Insufficient documentation

## 2021-04-17 DIAGNOSIS — E119 Type 2 diabetes mellitus without complications: Secondary | ICD-10-CM | POA: Diagnosis not present

## 2021-04-17 DIAGNOSIS — I10 Essential (primary) hypertension: Secondary | ICD-10-CM | POA: Insufficient documentation

## 2021-04-17 DIAGNOSIS — Z87891 Personal history of nicotine dependence: Secondary | ICD-10-CM | POA: Diagnosis not present

## 2021-04-17 DIAGNOSIS — R195 Other fecal abnormalities: Secondary | ICD-10-CM | POA: Insufficient documentation

## 2021-04-17 DIAGNOSIS — Z6836 Body mass index (BMI) 36.0-36.9, adult: Secondary | ICD-10-CM | POA: Diagnosis not present

## 2021-04-17 DIAGNOSIS — D509 Iron deficiency anemia, unspecified: Secondary | ICD-10-CM | POA: Insufficient documentation

## 2021-04-17 DIAGNOSIS — K219 Gastro-esophageal reflux disease without esophagitis: Secondary | ICD-10-CM | POA: Insufficient documentation

## 2021-04-17 DIAGNOSIS — Z7960 Long term (current) use of unspecified immunomodulators and immunosuppressants: Secondary | ICD-10-CM | POA: Diagnosis not present

## 2021-04-17 DIAGNOSIS — Z1211 Encounter for screening for malignant neoplasm of colon: Secondary | ICD-10-CM

## 2021-04-17 DIAGNOSIS — Z1212 Encounter for screening for malignant neoplasm of rectum: Secondary | ICD-10-CM | POA: Diagnosis not present

## 2021-04-17 DIAGNOSIS — D125 Benign neoplasm of sigmoid colon: Secondary | ICD-10-CM | POA: Diagnosis not present

## 2021-04-17 DIAGNOSIS — K648 Other hemorrhoids: Secondary | ICD-10-CM | POA: Diagnosis not present

## 2021-04-17 DIAGNOSIS — K635 Polyp of colon: Secondary | ICD-10-CM | POA: Diagnosis not present

## 2021-04-17 DIAGNOSIS — M069 Rheumatoid arthritis, unspecified: Secondary | ICD-10-CM | POA: Insufficient documentation

## 2021-04-17 HISTORY — PX: COLONOSCOPY WITH PROPOFOL: SHX5780

## 2021-04-17 HISTORY — PX: POLYPECTOMY: SHX5525

## 2021-04-17 LAB — GLUCOSE, CAPILLARY: Glucose-Capillary: 140 mg/dL — ABNORMAL HIGH (ref 70–99)

## 2021-04-17 SURGERY — COLONOSCOPY WITH PROPOFOL
Anesthesia: General

## 2021-04-17 MED ORDER — LACTATED RINGERS IV SOLN: INTRAVENOUS | Status: DC | PRN

## 2021-04-17 MED ORDER — PHENYLEPHRINE HCL (PRESSORS) 10 MG/ML IV SOLN
INTRAVENOUS | Status: DC | PRN
  Administered 2021-04-17: 80 ug via INTRAVENOUS
  Administered 2021-04-17 (×2): 120 ug via INTRAVENOUS
  Administered 2021-04-17 (×2): 80 ug via INTRAVENOUS

## 2021-04-17 MED ORDER — STERILE WATER FOR IRRIGATION IR SOLN
Status: DC | PRN
  Administered 2021-04-17: 120 mL

## 2021-04-17 MED ORDER — PROPOFOL 10 MG/ML IV BOLUS
INTRAVENOUS | Status: DC | PRN
  Administered 2021-04-17: 30 mg via INTRAVENOUS
  Administered 2021-04-17: 100 mg via INTRAVENOUS
  Administered 2021-04-17: 30 mg via INTRAVENOUS
  Administered 2021-04-17 (×2): 50 mg via INTRAVENOUS

## 2021-04-17 NOTE — Transfer of Care (Signed)
Immediate Anesthesia Transfer of Care Note  Patient: [name]  Procedure(s) Performed: COLONOSCOPY WITH PROPOFOL POLYPECTOMY  Patient Location: Short Stay  Anesthesia Type:General  Level of Consciousness: awake, alert  and oriented  Airway & Oxygen Therapy: Patient Spontanous Breathing  Post-op Assessment: Report given to RN and Post -op Vital signs reviewed and stable  Post vital signs: Reviewed and stable  Last Vitals:  Vitals Value Taken Time  BP    Temp    Pulse    Resp    SpO2      Last Pain:  Vitals:   04/17/21 0751  TempSrc:   PainSc: 2       Patients Stated Pain Goal: 5 (00/34/96 1164)  Complications: No notable events documented.

## 2021-04-17 NOTE — Discharge Instructions (Signed)
°  Colonoscopy Discharge Instructions  Read the instructions outlined below and refer to this sheet in the next few weeks. These discharge instructions provide you with general information on caring for yourself after you leave the hospital. Your doctor may also give you specific instructions. While your treatment has been planned according to the most current medical practices available, unavoidable complications occasionally occur.   ACTIVITY You may resume your regular activity, but move at a slower pace for the next 24 hours.  Take frequent rest periods for the next 24 hours.  Walking will help get rid of the air and reduce the bloated feeling in your belly (abdomen).  No driving for 24 hours (because of the medicine (anesthesia) used during the test).   Do not sign any important legal documents or operate any machinery for 24 hours (because of the anesthesia used during the test).  NUTRITION Drink plenty of fluids.  You may resume your normal diet as instructed by your doctor.  Begin with a light meal and progress to your normal diet. Heavy or fried foods are harder to digest and may make you feel sick to your stomach (nauseated).  Avoid alcoholic beverages for 24 hours or as instructed.  MEDICATIONS You may resume your normal medications unless your doctor tells you otherwise.  WHAT YOU CAN EXPECT TODAY Some feelings of bloating in the abdomen.  Passage of more gas than usual.  Spotting of blood in your stool or on the toilet paper.  IF YOU HAD POLYPS REMOVED DURING THE COLONOSCOPY: No aspirin products for 7 days or as instructed.  No alcohol for 7 days or as instructed.  Eat a soft diet for the next 24 hours.  FINDING OUT THE RESULTS OF YOUR TEST Not all test results are available during your visit. If your test results are not back during the visit, make an appointment with your caregiver to find out the results. Do not assume everything is normal if you have not heard from your  caregiver or the medical facility. It is important for you to follow up on all of your test results.  SEEK IMMEDIATE MEDICAL ATTENTION IF: You have more than a spotting of blood in your stool.  Your belly is swollen (abdominal distention).  You are nauseated or vomiting.  You have a temperature over 101.  You have abdominal pain or discomfort that is severe or gets worse throughout the day.   Your colonoscopy revealed 1 polyp(s) which I removed successfully. Await pathology results, my office will contact you. I recommend repeating colonoscopy in 5 years for surveillance purposes.   You also have diverticulosis and internal hemorrhoids. I would recommend increasing fiber in your diet or adding OTC Benefiber/Metamucil. Be sure to drink at least 4 to 6 glasses of water daily. Follow-up with GI as needed.   I hope you have a great rest of your week!  Simmie Garin K. Tiana Sivertson, D.O. Gastroenterology and Hepatology [facility]  

## 2021-04-17 NOTE — Op Note (Signed)
[facility] Patient Name: [name] Procedure Date: 04/17/2021 7:20 AM MRN: [id number] Date of Birth: 1955/12/14 Attending MD: Elon Alas. Abbey Chatters DO CSN: [id number] Age: 66 Admit Type: Outpatient Procedure:                Colonoscopy Indications:              Positive Cologuard test Providers:                Elon Alas. Abbey Chatters, DO, Hughie Closs, RN, Caprice Kluver, Nelma Rothman, Technician Referring MD:              Medicines:                See the Anesthesia note for documentation of the                            administered medications Complications:            No immediate complications. Estimated Blood Loss:     Estimated blood loss was minimal. Procedure:                Pre-Anesthesia Assessment:                           - The anesthesia plan was to use monitored                            anesthesia care (MAC).                           After obtaining informed consent, the colonoscope                            was passed under direct vision. Throughout the                            procedure, the patient's blood pressure, pulse, and                            oxygen saturations were monitored continuously. The                            [id number]-HQ190L ([id number]) scope was introduced through                            the anus and advanced to the the cecum, identified                            by appendiceal orifice and ileocecal valve. The                            colonoscopy was performed without difficulty. The                            patient tolerated the procedure well. The  quality                            of the bowel preparation was evaluated using the                            BBPS [facility] Bowel Preparation Scale) with scores                            of: Right Colon = 3, Transverse Colon = 3 and Left                            Colon = 3 (entire mucosa seen well with no residual                            staining, small fragments  of stool or opaque                            liquid). The total BBPS score equals 9. Scope In: 7:55:05 AM Scope Out: 8:13:28 AM Scope Withdrawal Time: 0 hours 15 minutes 32 seconds  Total Procedure Duration: 0 hours 18 minutes 23 seconds  Findings:      The perianal and digital rectal examinations were normal.      Non-bleeding internal hemorrhoids were found during endoscopy.      Multiple small-mouthed diverticula were found in the sigmoid colon.      A 3 mm polyp was found in the sigmoid colon. The polyp was sessile. The       polyp was removed with a cold snare. Resection and retrieval were       complete.      The exam was otherwise without abnormality. Impression:               - Non-bleeding internal hemorrhoids.                           - Diverticulosis in the sigmoid colon.                           - One 3 mm polyp in the sigmoid colon, removed with                            a cold snare. Resected and retrieved.                           - The examination was otherwise normal. Moderate Sedation:      Per Anesthesia Care Recommendation:           - Patient has a contact number available for                            emergencies. The signs and symptoms of potential                            delayed complications were discussed with the  patient. Return to normal activities tomorrow.                            Written discharge instructions were provided to the                            patient.                           - Resume previous diet.                           - Continue present medications.                           - Await pathology results.                           - Repeat colonoscopy in 5 years for surveillance.                           - Return to GI clinic PRN. Procedure Code(s):        --- Professional ---                           [phone], Colonoscopy, flexible; with removal of                            tumor(s), polyp(s),  or other lesion(s) by snare                            technique Diagnosis Code(s):        --- Professional ---                           K64.8, Other hemorrhoids                           K63.5, Polyp of colon                           R19.5, Other fecal abnormalities                           K57.30, Diverticulosis of large intestine without                            perforation or abscess without bleeding CPT copyright 2019 American Medical Association. All rights reserved. The codes documented in this report are preliminary and upon coder review may  be revised to meet current compliance requirements. Elon Alas. Abbey Chatters, DO [place] Abbey Chatters, DO 04/17/2021 8:18:08 AM This report has been signed electronically. Number of Addenda: 0

## 2021-04-17 NOTE — Interval H&P Note (Signed)
History and Physical Interval Note:  04/17/2021 7:39 AM  [name]  has presented today for surgery, with the diagnosis of IDA, positive cologuard.  The various methods of treatment have been discussed with the patient and family. After consideration of risks, benefits and other options for treatment, the patient has consented to  Procedure(s) with comments: COLONOSCOPY WITH PROPOFOL (N/A) - 8:15am, ASA 2 as a surgical intervention.  The patient's history has been reviewed, patient examined, no change in status, stable for surgery.  I have reviewed the patient's chart and labs.  Questions were answered to the patient's satisfaction.     Eloise Harman

## 2021-04-17 NOTE — Anesthesia Postprocedure Evaluation (Signed)
Anesthesia Post Note  Patient: ENOS MUHL  Procedure(s) Performed: COLONOSCOPY WITH PROPOFOL POLYPECTOMY  Patient location during evaluation: Phase II Anesthesia Type: General Level of consciousness: awake Pain management: pain level controlled Vital Signs Assessment: post-procedure vital signs reviewed and stable Respiratory status: spontaneous breathing and respiratory function stable Cardiovascular status: blood pressure returned to baseline and stable Postop Assessment: no headache and no apparent nausea or vomiting Anesthetic complications: no Comments: Late entry   No notable events documented.   Last Vitals:  Vitals:   04/17/21 0701 04/17/21 0818  BP:  108/69  Pulse:  98  Resp:  18  Temp: 36.8 C 36.6 C  SpO2:  95%    Last Pain:  Vitals:   04/17/21 0818  TempSrc: Oral  PainSc: 0-No pain                 Louann Sjogren

## 2021-04-17 NOTE — Anesthesia Preprocedure Evaluation (Signed)
Anesthesia Evaluation  Patient identified by MRN, date of birth, ID band Patient awake    Reviewed: Allergy & Precautions, H&P , NPO status , Patient's Chart, lab work & pertinent test results, reviewed documented beta blocker date and time   Airway Mallampati: II  TM Distance: >3 FB Neck ROM: full    Dental no notable dental hx.    Pulmonary neg pulmonary ROS, former smoker,    Pulmonary exam normal breath sounds clear to auscultation       Cardiovascular Exercise Tolerance: Good hypertension, negative cardio ROS   Rhythm:regular Rate:Normal     Neuro/Psych negative neurological ROS  negative psych ROS   GI/Hepatic negative GI ROS, Neg liver ROS,   Endo/Other  diabetes, Type 2Morbid obesity  Renal/GU negative Renal ROS  negative genitourinary   Musculoskeletal   Abdominal   Peds  Hematology  (+) Blood dyscrasia, anemia ,   Anesthesia Other Findings   Reproductive/Obstetrics negative OB ROS                             Anesthesia Physical Anesthesia Plan  ASA: 3  Anesthesia Plan: General   Post-op Pain Management:    Induction:   PONV Risk Score and Plan: Propofol infusion  Airway Management Planned:   Additional Equipment:   Intra-op Plan:   Post-operative Plan:   Informed Consent: I have reviewed the patients History and Physical, chart, labs and discussed the procedure including the risks, benefits and alternatives for the proposed anesthesia with the patient or authorized representative who has indicated his/her understanding and acceptance.     Dental Advisory Given  Plan Discussed with: CRNA  Anesthesia Plan Comments:         Anesthesia Quick Evaluation

## 2021-04-19 LAB — SURGICAL PATHOLOGY

## 2021-04-20 ENCOUNTER — Encounter (HOSPITAL_COMMUNITY): Payer: Self-pay | Admitting: Internal Medicine

## 2021-04-21 DIAGNOSIS — Z0000 Encounter for general adult medical examination without abnormal findings: Secondary | ICD-10-CM | POA: Diagnosis not present

## 2021-04-21 DIAGNOSIS — G894 Chronic pain syndrome: Secondary | ICD-10-CM | POA: Diagnosis not present

## 2021-04-21 DIAGNOSIS — Z79899 Other long term (current) drug therapy: Secondary | ICD-10-CM | POA: Diagnosis not present

## 2021-04-21 DIAGNOSIS — M545 Low back pain, unspecified: Secondary | ICD-10-CM | POA: Diagnosis not present

## 2021-04-21 DIAGNOSIS — G8929 Other chronic pain: Secondary | ICD-10-CM | POA: Diagnosis not present

## 2021-04-21 DIAGNOSIS — E119 Type 2 diabetes mellitus without complications: Secondary | ICD-10-CM | POA: Diagnosis not present

## 2021-04-21 DIAGNOSIS — Z6834 Body mass index (BMI) 34.0-34.9, adult: Secondary | ICD-10-CM | POA: Diagnosis not present

## 2021-05-02 DIAGNOSIS — Z79899 Other long term (current) drug therapy: Secondary | ICD-10-CM | POA: Diagnosis not present

## 2021-05-02 DIAGNOSIS — M0609 Rheumatoid arthritis without rheumatoid factor, multiple sites: Secondary | ICD-10-CM | POA: Diagnosis not present

## 2021-05-10 DIAGNOSIS — I10 Essential (primary) hypertension: Secondary | ICD-10-CM | POA: Diagnosis not present

## 2021-05-10 DIAGNOSIS — E559 Vitamin D deficiency, unspecified: Secondary | ICD-10-CM | POA: Diagnosis not present

## 2021-05-10 DIAGNOSIS — E7800 Pure hypercholesterolemia, unspecified: Secondary | ICD-10-CM | POA: Diagnosis not present

## 2021-05-10 DIAGNOSIS — E1165 Type 2 diabetes mellitus with hyperglycemia: Secondary | ICD-10-CM | POA: Diagnosis not present

## 2021-05-19 DIAGNOSIS — M19011 Primary osteoarthritis, right shoulder: Secondary | ICD-10-CM | POA: Diagnosis not present

## 2021-06-02 DIAGNOSIS — Z79899 Other long term (current) drug therapy: Secondary | ICD-10-CM | POA: Diagnosis not present

## 2021-06-02 DIAGNOSIS — M0609 Rheumatoid arthritis without rheumatoid factor, multiple sites: Secondary | ICD-10-CM | POA: Diagnosis not present

## 2021-06-19 ENCOUNTER — Other Ambulatory Visit: Payer: Self-pay | Admitting: Family

## 2021-06-19 DIAGNOSIS — Z79899 Other long term (current) drug therapy: Secondary | ICD-10-CM | POA: Diagnosis not present

## 2021-06-19 DIAGNOSIS — G894 Chronic pain syndrome: Secondary | ICD-10-CM | POA: Diagnosis not present

## 2021-06-19 DIAGNOSIS — G8929 Other chronic pain: Secondary | ICD-10-CM | POA: Diagnosis not present

## 2021-06-19 DIAGNOSIS — E1159 Type 2 diabetes mellitus with other circulatory complications: Secondary | ICD-10-CM

## 2021-06-19 DIAGNOSIS — M4310 Spondylolisthesis, site unspecified: Secondary | ICD-10-CM | POA: Diagnosis not present

## 2021-06-19 DIAGNOSIS — E119 Type 2 diabetes mellitus without complications: Secondary | ICD-10-CM | POA: Diagnosis not present

## 2021-06-19 DIAGNOSIS — M545 Low back pain, unspecified: Secondary | ICD-10-CM | POA: Diagnosis not present

## 2021-06-19 DIAGNOSIS — R030 Elevated blood-pressure reading, without diagnosis of hypertension: Secondary | ICD-10-CM | POA: Diagnosis not present

## 2021-06-19 DIAGNOSIS — Z6835 Body mass index (BMI) 35.0-35.9, adult: Secondary | ICD-10-CM | POA: Diagnosis not present

## 2021-06-22 ENCOUNTER — Inpatient Hospital Stay (HOSPITAL_COMMUNITY): Payer: Medicare Other | Attending: Hematology

## 2021-06-22 DIAGNOSIS — D509 Iron deficiency anemia, unspecified: Secondary | ICD-10-CM | POA: Insufficient documentation

## 2021-06-22 LAB — FERRITIN: Ferritin: 55 ng/mL (ref 24–336)

## 2021-06-22 LAB — CBC WITH DIFFERENTIAL/PLATELET
Abs Immature Granulocytes: 0.02 10*3/uL (ref 0.00–0.07)
Basophils Absolute: 0.1 10*3/uL (ref 0.0–0.1)
Basophils Relative: 1 %
Eosinophils Absolute: 0.7 10*3/uL — ABNORMAL HIGH (ref 0.0–0.5)
Eosinophils Relative: 7 %
HCT: 45.6 % (ref 39.0–52.0)
Hemoglobin: 15.7 g/dL (ref 13.0–17.0)
Immature Granulocytes: 0 %
Lymphocytes Relative: 28 %
Lymphs Abs: 3 10*3/uL (ref 0.7–4.0)
MCH: 32.6 pg (ref 26.0–34.0)
MCHC: 34.4 g/dL (ref 30.0–36.0)
MCV: 94.8 fL (ref 80.0–100.0)
Monocytes Absolute: 0.6 10*3/uL (ref 0.1–1.0)
Monocytes Relative: 6 %
Neutro Abs: 6.2 10*3/uL (ref 1.7–7.7)
Neutrophils Relative %: 58 %
Platelets: 273 10*3/uL (ref 150–400)
RBC: 4.81 MIL/uL (ref 4.22–5.81)
RDW: 11.4 % — ABNORMAL LOW (ref 11.5–15.5)
WBC: 10.6 10*3/uL — ABNORMAL HIGH (ref 4.0–10.5)
nRBC: 0 % (ref 0.0–0.2)

## 2021-06-22 LAB — IRON AND TIBC
Iron: 117 ug/dL (ref 45–182)
Saturation Ratios: 32 % (ref 17.9–39.5)
TIBC: 371 ug/dL (ref 250–450)
UIBC: 254 ug/dL

## 2021-06-22 LAB — VITAMIN B12: Vitamin B-12: 401 pg/mL (ref 180–914)

## 2021-06-25 LAB — METHYLMALONIC ACID, SERUM: Methylmalonic Acid, Quantitative: 106 nmol/L (ref 0–378)

## 2021-06-29 DIAGNOSIS — Z6834 Body mass index (BMI) 34.0-34.9, adult: Secondary | ICD-10-CM | POA: Diagnosis not present

## 2021-06-29 DIAGNOSIS — M0609 Rheumatoid arthritis without rheumatoid factor, multiple sites: Secondary | ICD-10-CM | POA: Diagnosis not present

## 2021-06-29 DIAGNOSIS — Z79899 Other long term (current) drug therapy: Secondary | ICD-10-CM | POA: Diagnosis not present

## 2021-06-29 DIAGNOSIS — M1991 Primary osteoarthritis, unspecified site: Secondary | ICD-10-CM | POA: Diagnosis not present

## 2021-06-29 DIAGNOSIS — E669 Obesity, unspecified: Secondary | ICD-10-CM | POA: Diagnosis not present

## 2021-06-29 DIAGNOSIS — M2550 Pain in unspecified joint: Secondary | ICD-10-CM | POA: Diagnosis not present

## 2021-06-29 NOTE — Progress Notes (Signed)
? ?Virtual Visit via Telephone Note ?[place] ? ?I connected with [name]  on 06/30/2021 at 12:55 PM by telephone and verified that I am speaking with the correct person using two identifiers. ? ?Location: ?Patient: Home ?Provider: [place] ?  ?I discussed the limitations, risks, security and privacy concerns of performing an evaluation and management service by telephone and the availability of in person appointments. I also discussed with the patient that there may be a patient responsible charge related to this service. The patient expressed understanding and agreed to proceed. ? ? ?HISTORY OF PRESENT ILLNESS: ?[name] (66 year old male) is contacted for follow-up visit for iron deficiency anemia.  He was last seen by Tarri Abernethy PA-C on 12/22/2020. ? ?Patient reports improved energy after his IV iron in September 2022, reports that he still has some mild fatigue.  He continues to take his iron tablet daily.  He took his B12 supplement for a few months, and then reports that he "forgot to take it" for the past month or two. ?He denies any bleeding such as epistaxis, hematochezia, or melena.  He has some chronic intermittent restless leg symptoms.  He denies any pica, headaches, chest pain, dyspnea on exertion, lightheadedness, or syncope. ?He continues to take Orencia for his rheumatoid arthritis.   ?He has 75% energy and 100% appetite.  He endorses that he is maintaining a stable weight. ? ?  ?OBSERVATIONS/OBJECTIVE: ?Review of Systems  ?Constitutional:  Positive for malaise/fatigue (mild fatigue). Negative for chills, diaphoresis, fever and weight loss.  ?Respiratory:  Negative for cough and shortness of breath.   ?Cardiovascular:  Negative for chest pain and palpitations.  ?Gastrointestinal:  Negative for abdominal pain, blood in stool, melena, nausea and vomiting.  ?Neurological:  Negative for dizziness and headaches.   ? ?PHYSICAL EXAM (per limitations of  virtual telephone visit): The patient is alert and oriented x 3, exhibiting adequate mentation, good mood, and ability to speak in full sentences and execute sound judgement. ? ? ?ASSESSMENT & PLAN: ?1.  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: ?- Last Feraheme infusion on 01/01/2019 and 01/09/2019  ?- History of positive Cologuard test ?- Colonoscopy (04/17/2021): Nonbleeding internal hemorrhoids, diverticulosis, polyp x1 ?- Received Feraheme x2 (with steroid premedication) on 12/26/2020 and 01/02/2021 ?- He has been taking oral ferrous sulfate daily  ?- No epistaxis, hematochezia, melena ?- He has mild fatigue ?- Most recent labs (06/22/2021): Hgb 15.7/MCV 94.8, ferritin 55, iron saturation 32% ?- Suspect malabsorption in the setting of chronic disease and inflammation, possibly in the setting of acid blocking pill for GERD, which he takes 3 times per week ?- PLAN: Continue oral iron supplementation ?- Patient was offered IV iron due to fatigue with ferritin l< 100, but he prefers to wait and recheck labs at next visit.  ?- Repeat labs with phone visit in 3 months (can go 6 months between visits if receiving IV iron) ? ?2.  B12 deficiency: ?- Patient is taking vitamin B12 cyanocobalamin 1 mg tablet daily, but has forgotten to take it for the last 2+ months ?- Most recent labs (06/22/2021): Normal vitamin B12 at 401, normal methylmalonic acid ?- PLAN: Continue daily B12 tablet.  Will recheck B12 and MMA in 6 months (September 2023). ?  ?3.  Rheumatoid arthritis: ?- His arthritis involves small joints of the hands and right shoulder.   ?- He is on Orencia every 4 weeks.  ?  ?4.  Tobacco abuse: ?- He quit  smoking in 1992.  Smoked 1.5 pack/day for 20 years. ?- He is not eligible for LDCT (quit > 15 years ago) ? ? ?FOLLOW UP INSTRUCTIONS: ?Labs in 3 months ?Phone visit after labs ? ?  ?I discussed the assessment and treatment plan with the patient. The patient was provided an opportunity to ask questions and all were  answered. The patient agreed with the plan and demonstrated an understanding of the instructions. ?  ?The patient was advised to call back or seek an in-person evaluation if the symptoms worsen or if the condition fails to improve as anticipated. ? ?I provided 12 minutes of non-face-to-face time during this encounter. ? ? ?Harriett Rush, PA-C ?06/30/2021 1:10 PM ?

## 2021-06-30 ENCOUNTER — Other Ambulatory Visit: Payer: Self-pay

## 2021-06-30 ENCOUNTER — Encounter (HOSPITAL_COMMUNITY): Payer: Self-pay | Admitting: Physician Assistant

## 2021-06-30 ENCOUNTER — Inpatient Hospital Stay (HOSPITAL_BASED_OUTPATIENT_CLINIC_OR_DEPARTMENT_OTHER): Payer: Medicare Other | Admitting: Physician Assistant

## 2021-06-30 DIAGNOSIS — M0609 Rheumatoid arthritis without rheumatoid factor, multiple sites: Secondary | ICD-10-CM | POA: Diagnosis not present

## 2021-06-30 DIAGNOSIS — D508 Other iron deficiency anemias: Secondary | ICD-10-CM | POA: Diagnosis not present

## 2021-06-30 DIAGNOSIS — E538 Deficiency of other specified B group vitamins: Secondary | ICD-10-CM | POA: Diagnosis not present

## 2021-07-20 DIAGNOSIS — M4310 Spondylolisthesis, site unspecified: Secondary | ICD-10-CM | POA: Diagnosis not present

## 2021-07-20 DIAGNOSIS — E119 Type 2 diabetes mellitus without complications: Secondary | ICD-10-CM | POA: Diagnosis not present

## 2021-07-20 DIAGNOSIS — G8929 Other chronic pain: Secondary | ICD-10-CM | POA: Diagnosis not present

## 2021-07-20 DIAGNOSIS — R030 Elevated blood-pressure reading, without diagnosis of hypertension: Secondary | ICD-10-CM | POA: Diagnosis not present

## 2021-07-20 DIAGNOSIS — G894 Chronic pain syndrome: Secondary | ICD-10-CM | POA: Diagnosis not present

## 2021-07-20 DIAGNOSIS — M545 Low back pain, unspecified: Secondary | ICD-10-CM | POA: Diagnosis not present

## 2021-07-20 DIAGNOSIS — Z6835 Body mass index (BMI) 35.0-35.9, adult: Secondary | ICD-10-CM | POA: Diagnosis not present

## 2021-07-20 DIAGNOSIS — Z79899 Other long term (current) drug therapy: Secondary | ICD-10-CM | POA: Diagnosis not present

## 2021-07-28 DIAGNOSIS — M0609 Rheumatoid arthritis without rheumatoid factor, multiple sites: Secondary | ICD-10-CM | POA: Diagnosis not present

## 2021-08-18 DIAGNOSIS — M545 Low back pain, unspecified: Secondary | ICD-10-CM | POA: Diagnosis not present

## 2021-08-18 DIAGNOSIS — M4310 Spondylolisthesis, site unspecified: Secondary | ICD-10-CM | POA: Diagnosis not present

## 2021-08-18 DIAGNOSIS — G8929 Other chronic pain: Secondary | ICD-10-CM | POA: Diagnosis not present

## 2021-08-18 DIAGNOSIS — R030 Elevated blood-pressure reading, without diagnosis of hypertension: Secondary | ICD-10-CM | POA: Diagnosis not present

## 2021-08-18 DIAGNOSIS — Z79899 Other long term (current) drug therapy: Secondary | ICD-10-CM | POA: Diagnosis not present

## 2021-08-18 DIAGNOSIS — G894 Chronic pain syndrome: Secondary | ICD-10-CM | POA: Diagnosis not present

## 2021-08-18 DIAGNOSIS — Z6834 Body mass index (BMI) 34.0-34.9, adult: Secondary | ICD-10-CM | POA: Diagnosis not present

## 2021-08-18 DIAGNOSIS — E119 Type 2 diabetes mellitus without complications: Secondary | ICD-10-CM | POA: Diagnosis not present

## 2021-08-25 DIAGNOSIS — M0609 Rheumatoid arthritis without rheumatoid factor, multiple sites: Secondary | ICD-10-CM | POA: Diagnosis not present

## 2021-08-27 IMAGING — DX DG SHOULDER 2+V PORT*R*
1 series · 1 of 1 positions shown · non-contrast
Comparison: June 05, 2019.

CLINICAL DATA: Status post right shoulder replacement.

EXAM:
PORTABLE RIGHT SHOULDER

[shoulder ap]
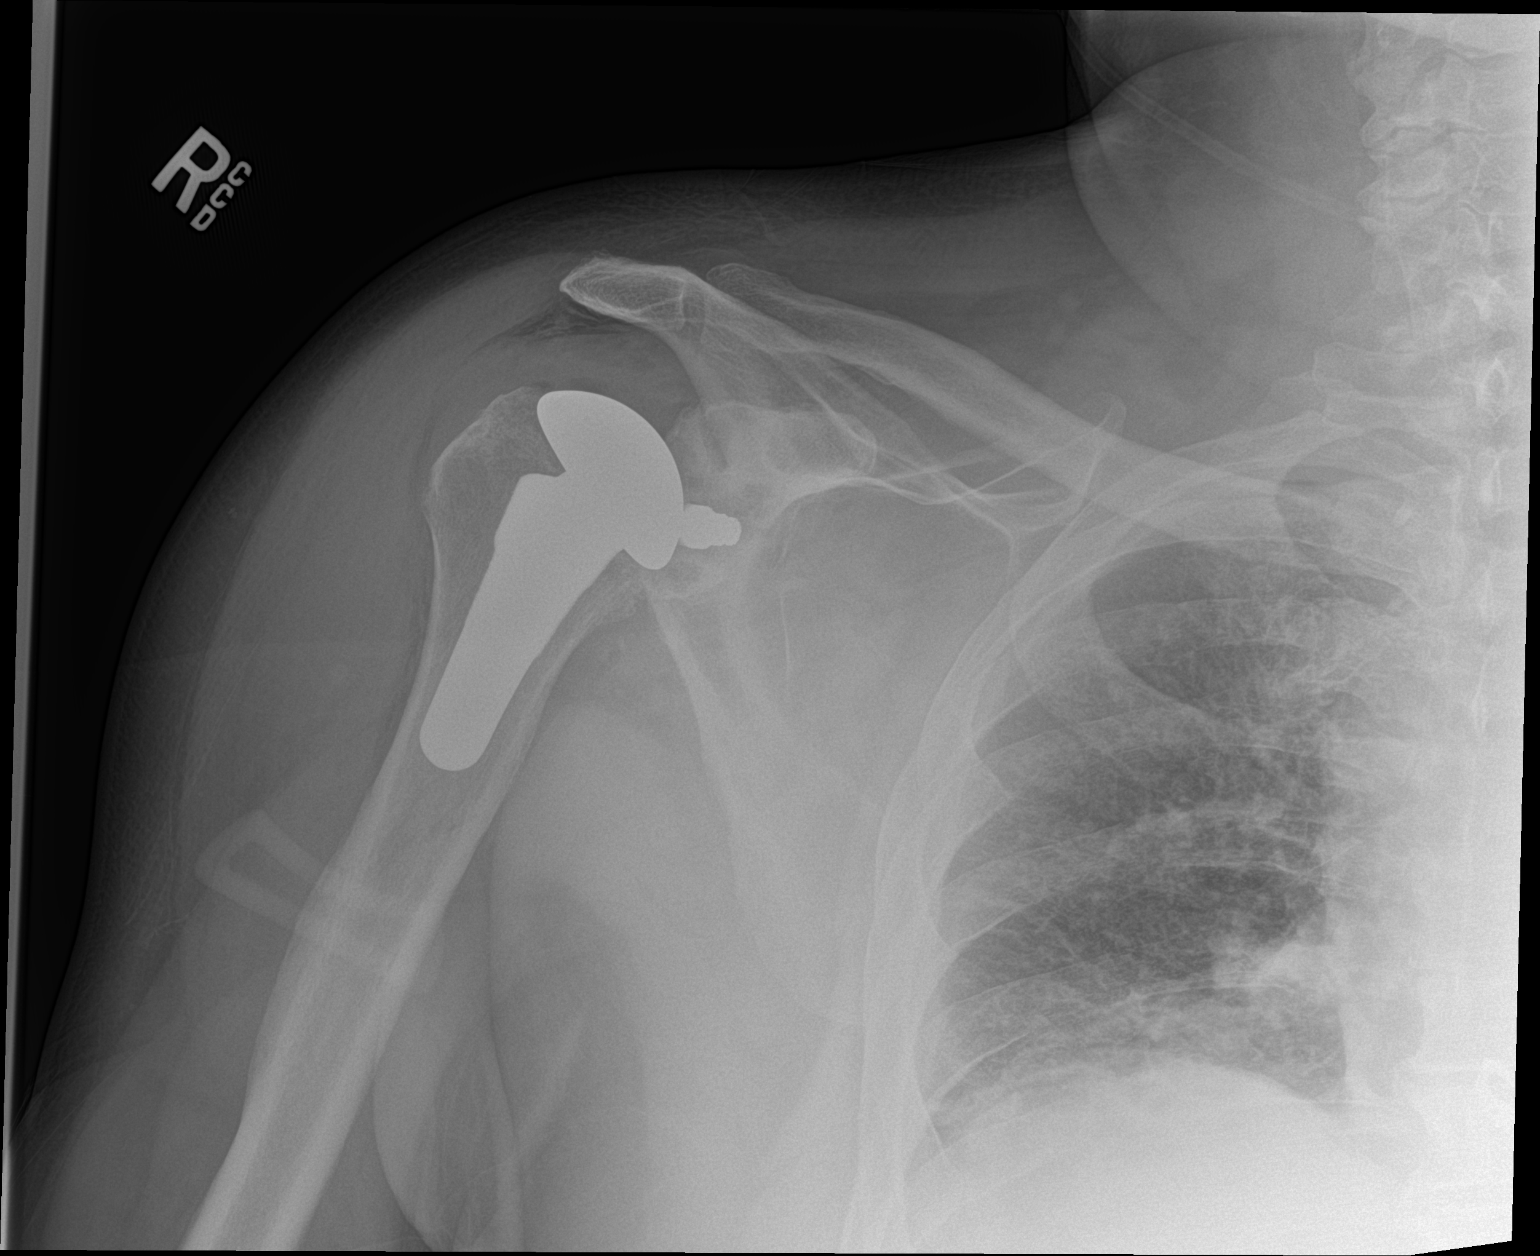

[1 of 1 positions shown; findings below may reference images not displayed]

FINDINGS: Right glenoid and humeral components appear to be well situated.
Expected postoperative changes seen in the surrounding soft tissues.
IMPRESSION: Status post right shoulder arthroplasty.

## 2021-08-28 ENCOUNTER — Ambulatory Visit (INDEPENDENT_AMBULATORY_CARE_PROVIDER_SITE_OTHER): Payer: Medicare Other | Admitting: Family

## 2021-08-28 ENCOUNTER — Encounter: Payer: Self-pay | Admitting: Family

## 2021-08-28 VITALS — BP 125/68 | HR 67 | Temp 97.8°F | Resp 20 | Ht 68.0 in | Wt 241.0 lb

## 2021-08-28 DIAGNOSIS — D509 Iron deficiency anemia, unspecified: Secondary | ICD-10-CM | POA: Diagnosis not present

## 2021-08-28 DIAGNOSIS — E1165 Type 2 diabetes mellitus with hyperglycemia: Secondary | ICD-10-CM

## 2021-08-28 DIAGNOSIS — E785 Hyperlipidemia, unspecified: Secondary | ICD-10-CM | POA: Diagnosis not present

## 2021-08-28 DIAGNOSIS — B372 Candidiasis of skin and nail: Secondary | ICD-10-CM

## 2021-08-28 DIAGNOSIS — M19011 Primary osteoarthritis, right shoulder: Secondary | ICD-10-CM

## 2021-08-28 DIAGNOSIS — M059 Rheumatoid arthritis with rheumatoid factor, unspecified: Secondary | ICD-10-CM

## 2021-08-28 DIAGNOSIS — E1169 Type 2 diabetes mellitus with other specified complication: Secondary | ICD-10-CM

## 2021-08-28 DIAGNOSIS — M1711 Unilateral primary osteoarthritis, right knee: Secondary | ICD-10-CM | POA: Diagnosis not present

## 2021-08-28 DIAGNOSIS — I152 Hypertension secondary to endocrine disorders: Secondary | ICD-10-CM

## 2021-08-28 DIAGNOSIS — E1159 Type 2 diabetes mellitus with other circulatory complications: Secondary | ICD-10-CM | POA: Diagnosis not present

## 2021-08-28 LAB — BAYER DCA HB A1C WAIVED: HB A1C (BAYER DCA - WAIVED): 7.6 % — ABNORMAL HIGH (ref 4.8–5.6)

## 2021-08-28 MED ORDER — NYSTATIN 100000 UNIT/ML MT SUSP: 5.0000 mL | Freq: Four times a day (QID) | OROMUCOSAL | 2 refills | Status: DC | PRN

## 2021-08-28 MED ORDER — KETOCONAZOLE 2 % EX CREA: 1.0000 "application " | TOPICAL_CREAM | Freq: Every day | CUTANEOUS | 2 refills | Status: AC

## 2021-08-28 NOTE — Progress Notes (Signed)
Subjective:    Patient ID: [name], male    DOB: September 20, 1955, 66 y.o.   MRN: [id number]  Chief Complaint  Patient presents with   Medical Management of Chronic Issues    6 mo    PT presnets to the office today for chronic follow up. Pt has rheumatoid arthritis and followed by rheumatologists every 6 months. Pt is followed by Cardiologists annually. He is followed by Hematologists for iron deficiency.     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 He is currently wearing a sling and states his pain is stable.   He is morbid obese with a BMI of 36 and DM and HTN.  Hypertension This is a chronic problem. The current episode started more than 1 year ago. The problem has been resolved since onset. The problem is controlled. Associated symptoms include blurred vision. Pertinent negatives include no malaise/fatigue, peripheral edema or shortness of breath. Risk factors for coronary artery disease include dyslipidemia, diabetes mellitus, obesity, male gender and sedentary lifestyle. The current treatment provides moderate improvement.  Hyperlipidemia This is a chronic problem. The current episode started more than 1 year ago. The problem is controlled. Recent lipid tests were reviewed and are normal. Pertinent negatives include no shortness of breath. The current treatment provides moderate improvement of lipids. Risk factors for coronary artery disease include diabetes mellitus, dyslipidemia, hypertension, male sex and a sedentary lifestyle.  Diabetes He presents for his follow-up diabetic visit. He has type 2 diabetes mellitus. Associated symptoms include blurred vision. Pertinent negatives for diabetes include no foot paresthesias. Symptoms are stable. Diabetic complications include heart disease. Risk factors for coronary artery disease include dyslipidemia, diabetes mellitus, male sex, hypertension and sedentary lifestyle. He is following a generally healthy diet. His overall blood  glucose range is 140-180 mg/dl. Eye exam is current.  Arthritis Presents for follow-up visit. He complains of pain and stiffness. The symptoms have been stable. Affected locations include the left knee, right knee, left MCP, right MCP, left shoulder and right shoulder (back). His pain is at a severity of 4/10.  Anemia Presents for follow-up visit. There has been no anorexia or malaise/fatigue.     Review of Systems  Constitutional:  Negative for malaise/fatigue.  Eyes:  Positive for blurred vision.  Respiratory:  Negative for shortness of breath.   Gastrointestinal:  Negative for anorexia.  Musculoskeletal:  Positive for arthritis and stiffness.  All other systems reviewed and are negative.     Objective:   Physical Exam Vitals reviewed.  Constitutional:      General: He is not in acute distress.    Appearance: He is well-developed. He is obese.  HENT:     Head: Normocephalic.     Right Ear: Tympanic membrane normal.     Left Ear: Tympanic membrane normal.  Eyes:     General:        Right eye: No discharge.        Left eye: No discharge.     Pupils: Pupils are equal, round, and reactive to light.  Neck:     Thyroid: No thyromegaly.  Cardiovascular:     Rate and Rhythm: Normal rate and regular rhythm.     Heart sounds: Normal heart sounds. No murmur heard. Pulmonary:     Effort: Pulmonary effort is normal. No respiratory distress.     Breath sounds: Normal breath sounds. No wheezing.  Abdominal:     General: Bowel sounds are normal. There is no distension.  Palpations: Abdomen is soft.     Tenderness: There is no abdominal tenderness.  Musculoskeletal:        General: No tenderness. Normal range of motion.     Cervical back: Normal range of motion and neck supple.  Skin:    General: Skin is warm and dry.     Findings: No erythema or rash.  Neurological:     Mental Status: He is alert and oriented to person, place, and time.     Cranial Nerves: No cranial nerve  deficit.     Deep Tendon Reflexes: Reflexes are normal and symmetric.  Psychiatric:        Behavior: Behavior normal.        Thought Content: Thought content normal.        Judgment: Judgment normal.      BP 125/68   Pulse 67   Temp 97.8 F (36.6 C) (Temporal)   Resp 20   Ht 5' 8"  (1.727 m)   Wt 241 lb (109.3 kg)   SpO2 94%   BMI 36.64 kg/m      Assessment & Plan:  [name] comes in today with chief complaint of Medical Management of Chronic Issues (6 mo )   Diagnosis and orders addressed:  1. Type 2 diabetes mellitus with hyperglycemia, without long-term current use of insulin (HCC) - CBC with Differential/Platelet - Bayer DCA Hb A1c Waived  2. Hypertension associated with diabetes ([place]) - CBC with Differential/Platelet - CMP14+EGFR  3. Hyperlipidemia associated with type 2 diabetes mellitus ([place]) - CBC with Differential/Platelet - Lipid panel  4. Rheumatoid arthritis with positive rheumatoid factor, involving unspecified site ([place])  5. Primary localized osteoarthritis of right knee  6. Morbid obesity ([place])  7. Osteoarthritis of right shoulder, unspecified osteoarthritis type  8. Iron deficiency anemia, unspecified iron deficiency anemia type  9. Yeast infection of the skin - ketoconazole (NIZORAL) 2 % cream; Apply 1 application. topically daily.  Dispense: 60 g; Refill: 2   Labs pending Health Maintenance reviewed Diet and exercise encouraged  Follow up plan: 6 months    Cameron Dun, FNP

## 2021-08-28 NOTE — Patient Instructions (Signed)
Health Maintenance After Age 68 After age 68, you are at a higher risk for certain long-term diseases and infections as well as injuries from falls. Falls are a major cause of broken bones and head injuries in people who are older than age 68. Getting regular preventive care can help to keep you healthy and well. Preventive care includes getting regular testing and making lifestyle changes as recommended by your health care provider. Talk with your health care provider about: Which screenings and tests you should have. A screening is a test that checks for a disease when you have no symptoms. A diet and exercise plan that is right for you. What should I know about screenings and tests to prevent falls? Screening and testing are the best ways to find a health problem early. Early diagnosis and treatment give you the best chance of managing medical conditions that are common after age 68. Certain conditions and lifestyle choices may make you more likely to have a fall. Your health care provider may recommend: Regular vision checks. Poor vision and conditions such as cataracts can make you more likely to have a fall. If you wear glasses, make sure to get your prescription updated if your vision changes. Medicine review. Work with your health care provider to regularly review all of the medicines you are taking, including over-the-counter medicines. Ask your health care provider about any side effects that may make you more likely to have a fall. Tell your health care provider if any medicines that you take make you feel dizzy or sleepy. Strength and balance checks. Your health care provider may recommend certain tests to check your strength and balance while standing, walking, or changing positions. Foot health exam. Foot pain and numbness, as well as not wearing proper footwear, can make you more likely to have a fall. Screenings, including: Osteoporosis screening. Osteoporosis is a condition that causes  the bones to get weaker and break more easily. Blood pressure screening. Blood pressure changes and medicines to control blood pressure can make you feel dizzy. Depression screening. You may be more likely to have a fall if you have a fear of falling, feel depressed, or feel unable to do activities that you used to do. Alcohol use screening. Using too much alcohol can affect your balance and may make you more likely to have a fall. Follow these instructions at home: Lifestyle Do not drink alcohol if: Your health care provider tells you not to drink. If you drink alcohol: Limit how much you have to: 0-1 drink a day for women. 0-2 drinks a day for men. Know how much alcohol is in your drink. In the [place] one drink equals one 12 oz bottle of beer (355 mL), one 5 oz glass of wine (148 mL), or one 1 oz glass of hard liquor (44 mL). Do not use any products that contain nicotine or tobacco. These products include cigarettes, chewing tobacco, and vaping devices, such as e-cigarettes. If you need help quitting, ask your health care provider. Activity  Follow a regular exercise program to stay fit. This will help you maintain your balance. Ask your health care provider what types of exercise are appropriate for you. If you need a cane or walker, use it as recommended by your health care provider. Wear supportive shoes that have nonskid soles. Safety  Remove any tripping hazards, such as rugs, cords, and clutter. Install safety equipment such as grab bars in bathrooms and safety rails on stairs. Keep rooms and walkways   well-lit. General instructions Talk with your health care provider about your risks for falling. Tell your health care provider if: You fall. Be sure to tell your health care provider about all falls, even ones that seem minor. You feel dizzy, tiredness (fatigue), or off-balance. Take over-the-counter and prescription medicines only as told by your health care provider. These include  supplements. Eat a healthy diet and maintain a healthy weight. A healthy diet includes low-fat dairy products, low-fat (lean) meats, and fiber from whole grains, beans, and lots of fruits and vegetables. Stay current with your vaccines. Schedule regular health, dental, and eye exams. Summary Having a healthy lifestyle and getting preventive care can help to protect your health and wellness after age 68. Screening and testing are the best way to find a health problem early and help you avoid having a fall. Early diagnosis and treatment give you the best chance for managing medical conditions that are more common for people who are older than age 68. Falls are a major cause of broken bones and head injuries in people who are older than age 68. Take precautions to prevent a fall at home. Work with your health care provider to learn what changes you can make to improve your health and wellness and to prevent falls. This information is not intended to replace advice given to you by your health care provider. Make sure you discuss any questions you have with your health care provider. Document Revised: 08/15/2020 Document Reviewed: 08/15/2020 Elsevier Patient Education  2023 [employer]  

## 2021-08-29 LAB — CBC WITH DIFFERENTIAL/PLATELET
Basophils Absolute: 0.1 10*3/uL (ref 0.0–0.2)
Basos: 1 %
EOS (ABSOLUTE): 0.7 10*3/uL — ABNORMAL HIGH (ref 0.0–0.4)
Eos: 7 %
Hematocrit: 43.9 % (ref 37.5–51.0)
Hemoglobin: 15 g/dL (ref 13.0–17.7)
Immature Grans (Abs): 0 10*3/uL (ref 0.0–0.1)
Immature Granulocytes: 0 %
Lymphocytes Absolute: 3.3 10*3/uL — ABNORMAL HIGH (ref 0.7–3.1)
Lymphs: 33 %
MCH: 31.3 pg (ref 26.6–33.0)
MCHC: 34.2 g/dL (ref 31.5–35.7)
MCV: 92 fL (ref 79–97)
Monocytes Absolute: 0.5 10*3/uL (ref 0.1–0.9)
Monocytes: 5 %
Neutrophils Absolute: 5.5 10*3/uL (ref 1.4–7.0)
Neutrophils: 54 %
Platelets: 278 10*3/uL (ref 150–450)
RBC: 4.8 x10E6/uL (ref 4.14–5.80)
RDW: 12 % (ref 11.6–15.4)
WBC: 10.1 10*3/uL (ref 3.4–10.8)

## 2021-08-29 LAB — LIPID PANEL
Chol/HDL Ratio: 3.3 ratio (ref 0.0–5.0)
Cholesterol, Total: 101 mg/dL (ref 100–199)
HDL: 31 mg/dL — ABNORMAL LOW (ref >39)
LDL Chol Calc (NIH): 56 mg/dL (ref 0–99)
Triglycerides: 64 mg/dL (ref 0–149)
VLDL Cholesterol Cal: 14 mg/dL (ref 5–40)

## 2021-08-29 LAB — CMP14+EGFR
ALT: 22 IU/L (ref 0–44)
AST: 18 IU/L (ref 0–40)
Albumin/Globulin Ratio: 1.9 (ref 1.2–2.2)
Albumin: 4.3 g/dL (ref 3.8–4.8)
Alkaline Phosphatase: 70 IU/L (ref 44–121)
BUN/Creatinine Ratio: 14 (ref 10–24)
BUN: 16 mg/dL (ref 8–27)
Bilirubin Total: 0.4 mg/dL (ref 0.0–1.2)
CO2: 21 mmol/L (ref 20–29)
Calcium: 9.7 mg/dL (ref 8.6–10.2)
Chloride: 100 mmol/L (ref 96–106)
Creatinine, Ser: 1.11 mg/dL (ref 0.76–1.27)
Globulin, Total: 2.3 g/dL (ref 1.5–4.5)
Glucose: 189 mg/dL — ABNORMAL HIGH (ref 70–99)
Potassium: 4.3 mmol/L (ref 3.5–5.2)
Sodium: 136 mmol/L (ref 134–144)
Total Protein: 6.6 g/dL (ref 6.0–8.5)
eGFR: 72 mL/min/{1.73_m2} (ref >59)

## 2021-09-13 DIAGNOSIS — E11319 Type 2 diabetes mellitus with unspecified diabetic retinopathy without macular edema: Secondary | ICD-10-CM | POA: Diagnosis not present

## 2021-09-13 DIAGNOSIS — E1165 Type 2 diabetes mellitus with hyperglycemia: Secondary | ICD-10-CM | POA: Diagnosis not present

## 2021-09-13 DIAGNOSIS — I10 Essential (primary) hypertension: Secondary | ICD-10-CM | POA: Diagnosis not present

## 2021-09-13 DIAGNOSIS — E7800 Pure hypercholesterolemia, unspecified: Secondary | ICD-10-CM | POA: Diagnosis not present

## 2021-09-13 DIAGNOSIS — D509 Iron deficiency anemia, unspecified: Secondary | ICD-10-CM | POA: Diagnosis not present

## 2021-09-13 DIAGNOSIS — E559 Vitamin D deficiency, unspecified: Secondary | ICD-10-CM | POA: Diagnosis not present

## 2021-09-13 DIAGNOSIS — M069 Rheumatoid arthritis, unspecified: Secondary | ICD-10-CM | POA: Diagnosis not present

## 2021-09-18 DIAGNOSIS — Z7984 Long term (current) use of oral hypoglycemic drugs: Secondary | ICD-10-CM | POA: Diagnosis not present

## 2021-09-18 DIAGNOSIS — H2512 Age-related nuclear cataract, left eye: Secondary | ICD-10-CM | POA: Diagnosis not present

## 2021-09-18 DIAGNOSIS — H353131 Nonexudative age-related macular degeneration, bilateral, early dry stage: Secondary | ICD-10-CM | POA: Diagnosis not present

## 2021-09-18 DIAGNOSIS — E119 Type 2 diabetes mellitus without complications: Secondary | ICD-10-CM | POA: Diagnosis not present

## 2021-09-18 LAB — HM DIABETES EYE EXAM

## 2021-09-22 DIAGNOSIS — Z6834 Body mass index (BMI) 34.0-34.9, adult: Secondary | ICD-10-CM | POA: Diagnosis not present

## 2021-09-22 DIAGNOSIS — R030 Elevated blood-pressure reading, without diagnosis of hypertension: Secondary | ICD-10-CM | POA: Diagnosis not present

## 2021-09-22 DIAGNOSIS — G894 Chronic pain syndrome: Secondary | ICD-10-CM | POA: Diagnosis not present

## 2021-09-22 DIAGNOSIS — M545 Low back pain, unspecified: Secondary | ICD-10-CM | POA: Diagnosis not present

## 2021-09-22 DIAGNOSIS — E119 Type 2 diabetes mellitus without complications: Secondary | ICD-10-CM | POA: Diagnosis not present

## 2021-09-22 DIAGNOSIS — Z79899 Other long term (current) drug therapy: Secondary | ICD-10-CM | POA: Diagnosis not present

## 2021-09-22 DIAGNOSIS — M4310 Spondylolisthesis, site unspecified: Secondary | ICD-10-CM | POA: Diagnosis not present

## 2021-09-22 DIAGNOSIS — G8929 Other chronic pain: Secondary | ICD-10-CM | POA: Diagnosis not present

## 2021-09-25 ENCOUNTER — Other Ambulatory Visit: Payer: Self-pay | Admitting: Family

## 2021-09-25 DIAGNOSIS — E1159 Type 2 diabetes mellitus with other circulatory complications: Secondary | ICD-10-CM

## 2021-09-26 DIAGNOSIS — Z79899 Other long term (current) drug therapy: Secondary | ICD-10-CM | POA: Diagnosis not present

## 2021-09-26 DIAGNOSIS — M0609 Rheumatoid arthritis without rheumatoid factor, multiple sites: Secondary | ICD-10-CM | POA: Diagnosis not present

## 2021-09-29 ENCOUNTER — Inpatient Hospital Stay (HOSPITAL_COMMUNITY): Payer: Medicare Other | Attending: Hematology

## 2021-09-29 DIAGNOSIS — E538 Deficiency of other specified B group vitamins: Secondary | ICD-10-CM

## 2021-09-29 DIAGNOSIS — D508 Other iron deficiency anemias: Secondary | ICD-10-CM

## 2021-09-29 DIAGNOSIS — D500 Iron deficiency anemia secondary to blood loss (chronic): Secondary | ICD-10-CM | POA: Diagnosis not present

## 2021-09-29 LAB — CBC WITH DIFFERENTIAL/PLATELET
Abs Immature Granulocytes: 0.02 10*3/uL (ref 0.00–0.07)
Basophils Absolute: 0.1 10*3/uL (ref 0.0–0.1)
Basophils Relative: 1 %
Eosinophils Absolute: 0.5 10*3/uL (ref 0.0–0.5)
Eosinophils Relative: 6 %
HCT: 42.8 % (ref 39.0–52.0)
Hemoglobin: 14.8 g/dL (ref 13.0–17.0)
Immature Granulocytes: 0 %
Lymphocytes Relative: 30 %
Lymphs Abs: 2.5 10*3/uL (ref 0.7–4.0)
MCH: 32.1 pg (ref 26.0–34.0)
MCHC: 34.6 g/dL (ref 30.0–36.0)
MCV: 92.8 fL (ref 80.0–100.0)
Monocytes Absolute: 0.4 10*3/uL (ref 0.1–1.0)
Monocytes Relative: 5 %
Neutro Abs: 4.9 10*3/uL (ref 1.7–7.7)
Neutrophils Relative %: 58 %
Platelets: 247 10*3/uL (ref 150–400)
RBC: 4.61 MIL/uL (ref 4.22–5.81)
RDW: 12.3 % (ref 11.5–15.5)
WBC: 8.5 10*3/uL (ref 4.0–10.5)
nRBC: 0 % (ref 0.0–0.2)

## 2021-09-29 LAB — IRON AND TIBC
Iron: 99 ug/dL (ref 45–182)
Saturation Ratios: 25 % (ref 17.9–39.5)
TIBC: 391 ug/dL (ref 250–450)
UIBC: 292 ug/dL

## 2021-09-29 LAB — FERRITIN: Ferritin: 43 ng/mL (ref 24–336)

## 2021-09-29 LAB — VITAMIN B12: Vitamin B-12: 382 pg/mL (ref 180–914)

## 2021-10-01 LAB — METHYLMALONIC ACID, SERUM: Methylmalonic Acid, Quantitative: 120 nmol/L (ref 0–378)

## 2021-10-04 ENCOUNTER — Other Ambulatory Visit: Payer: Self-pay | Admitting: Family

## 2021-10-04 DIAGNOSIS — E1159 Type 2 diabetes mellitus with other circulatory complications: Secondary | ICD-10-CM

## 2021-10-05 NOTE — Progress Notes (Signed)
Virtual Visit via Telephone Note [facility]  I connected with [name]  on 10/06/21 at  1:32 PM by telephone and verified that I am speaking with the correct person using two identifiers.  Location: Patient: Home Provider: [facility]   I discussed the limitations, risks, security and privacy concerns of performing an evaluation and management service by telephone and the availability of in person appointments. I also discussed with the patient that there may be a patient responsible charge related to this service. The patient expressed understanding and agreed to proceed.   INTERVAL HISTORY: [name] 66 year old male) is contacted for follow-up visit for iron deficiency anemia.  He was last evaluated via telemedicine visit by Tarri Abernethy PA-C on 06/30/2021.  He is doing well today.  He denies any fatigue and reports that he has good energy.  He is taking iron tablet daily.  He takes B12 tablet "when he remembers it," about once per week.  He denies any bleeding such as epistaxis, hematochezia, or melena.  He has some chronic intermittent restless leg symptoms.  He denies any pica, headaches, chest pain, dyspnea on exertion, lightheadedness, or syncope. He continues to take Orencia for his rheumatoid arthritis.   He has 100% energy and 100% appetite.  He endorses that he is maintaining a stable weight.    OBSERVATIONS/OBJECTIVE: Review of Systems  Constitutional:  Negative for chills, diaphoresis, fever, malaise/fatigue and weight loss.  Respiratory:  Negative for cough and shortness of breath.   Cardiovascular:  Negative for chest pain and palpitations.  Gastrointestinal:  Negative for abdominal pain, blood in stool, melena, nausea and vomiting.  Musculoskeletal:  Positive for back pain and joint pain.  Neurological:  Negative for dizziness and headaches.     PHYSICAL EXAM (per limitations of virtual telephone visit): The patient is alert  and oriented x 3, exhibiting adequate mentation, good mood, and ability to speak in full sentences and execute sound judgement.   ASSESSMENT & PLAN: 1.  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: - Last Feraheme infusion on 01/01/2019 and 01/09/2019  - History of positive Cologuard test - Colonoscopy (04/17/2021): Nonbleeding internal hemorrhoids, diverticulosis, polyp x1 - Received Feraheme x2 (with steroid premedication) on 12/26/2020 and 01/02/2021 - He has been taking oral ferrous sulfate daily  - No epistaxis, hematochezia, melena - He has mild fatigue - Most recent labs (09/29/2021): Hgb 14.8/MCV 92.8, ferritin 43, iron saturation 25% - Suspect malabsorption in the setting of chronic disease and inflammation, possibly in the setting of acid blocking pill for GERD, which he takes 3 times per week - PLAN: Continue oral iron supplementation - Repeat labs in 4 months with office visit the week after.  (Annual office visit, otherwise patient can be seen via telephone visit)  2.  B12 deficiency: - Patient is taking vitamin B12 cyanocobalamin 1000 mcg tablet "when he remembers it," about once a week - Most recent labs (09/29/2021): Normal vitamin B12 at 382, normal methylmalonic acid - PLAN: Continue daily B12 tablet.  Will recheck B12 and MMA in 4 months.     3.  Rheumatoid arthritis: - His arthritis involves small joints of the hands and right shoulder.   - He is on Orencia every 4 weeks.    4.  Tobacco abuse: - He quit smoking in 1992.  Smoked 1.5 pack/day for 20 years. - He is not eligible for LDCT (quit > 15 years ago)   FOLLOW UP INSTRUCTIONS: Labs in 4  months Office visit 1 week after labs    I discussed the assessment and treatment plan with the patient. The patient was provided an opportunity to ask questions and all were answered. The patient agreed with the plan and demonstrated an understanding of the instructions.   The patient was advised to call back or seek an  in-person evaluation if the symptoms worsen or if the condition fails to improve as anticipated.  I provided 14 minutes of non-face-to-face time during this encounter.   Harriett Rush, PA-C 10/06/21 1:46 PM

## 2021-10-06 ENCOUNTER — Inpatient Hospital Stay (HOSPITAL_BASED_OUTPATIENT_CLINIC_OR_DEPARTMENT_OTHER): Payer: Medicare Other | Admitting: Physician Assistant

## 2021-10-06 DIAGNOSIS — D508 Other iron deficiency anemias: Secondary | ICD-10-CM | POA: Diagnosis not present

## 2021-10-06 DIAGNOSIS — E538 Deficiency of other specified B group vitamins: Secondary | ICD-10-CM | POA: Diagnosis not present

## 2021-10-17 ENCOUNTER — Ambulatory Visit (INDEPENDENT_AMBULATORY_CARE_PROVIDER_SITE_OTHER): Payer: Medicare Other

## 2021-10-17 VITALS — Wt 241.0 lb

## 2021-10-17 DIAGNOSIS — Z0000 Encounter for general adult medical examination without abnormal findings: Secondary | ICD-10-CM | POA: Diagnosis not present

## 2021-10-17 DIAGNOSIS — Z79899 Other long term (current) drug therapy: Secondary | ICD-10-CM | POA: Insufficient documentation

## 2021-10-17 NOTE — Progress Notes (Signed)
Subjective:   [name] is a 66 y.o. male who presents for Medicare Annual/Subsequent preventive examination.  Virtual Visit via Telephone Note  I connected with  [name] on 10/17/21 at 10:30 AM EDT by telephone and verified that I am speaking with the correct person using two identifiers.  Location: Patient: Home Provider: [facility] Persons participating in the virtual visit: patient/Nurse Health Advisor   I discussed the limitations, risks, security and privacy concerns of performing an evaluation and management service by telephone and the availability of in person appointments. The patient expressed understanding and agreed to proceed.  Interactive audio and video telecommunications were attempted between this nurse and patient, however failed, due to patient having technical difficulties OR patient did not have access to video capability.  We continued and completed visit with audio only.  Some vital signs may be absent or patient reported.   Johnel Yielding E Shiva Sahagian, LPN   Review of Systems     Cardiac Risk Factors include: advanced age (>37mn, >>39women);diabetes mellitus;dyslipidemia;hypertension;male gender;obesity (BMI >30kg/m2)     Objective:    Today's Vitals   10/17/21 1031  Weight: 241 lb (109.3 kg)  PainSc: 3    Body mass index is 36.64 kg/m.     10/17/2021   10:39 AM 06/30/2021   11:23 AM 04/17/2021    7:03 AM 01/02/2021    8:36 AM 12/26/2020    8:27 AM 12/22/2020    8:27 AM 10/14/2020    2:41 PM  Advanced Directives  Does Patient Have a Medical Advance Directive? No No No No No No No  Would patient like information on creating a medical advance directive? No - Patient declined No - Patient declined  No - Patient declined No - Patient declined No - Patient declined No - Patient declined    Current Medications (verified) Outpatient Encounter Medications as of 10/17/2021  Medication Sig   aspirin 81 MG chewable tablet Chew 81 mg by mouth daily.    glimepiride (AMARYL) 2 MG tablet Take 1 tablet (2 mg total) by mouth daily. (Patient taking differently: Take 2 mg by mouth in the morning and at bedtime.)   hydrochlorothiazide (MICROZIDE) 12.5 MG capsule TAKE (1) CAPSULE DAILY   ketoconazole (NIZORAL) 2 % cream Apply 1 application. topically daily.   losartan (COZAAR) 100 MG tablet TAKE 1 TABLET DAILY   magic mouthwash (nystatin, lidocaine, diphenhydrAMINE, alum & mag hydroxide) suspension Swish and swallow 5 mLs 4 (four) times daily as needed for mouth pain.   meloxicam (MOBIC) 15 MG tablet TAKE 1 TABELT ONCE DAILY   metFORMIN (GLUCOPHAGE) 1000 MG tablet TAKE 1 TABLET IN THE MORNING AND 1&1/2 TABLETS IN THE EVENING   Multiple Vitamins-Minerals (MULTI FOR HIM) TABS Take 1 tablet by mouth daily.   oxyCODONE-acetaminophen (PERCOCET) 10-325 MG tablet Take 1-2 tablets by mouth every 6 (six) hours as needed for pain. MAXIMUM TOTAL ACETAMINOPHEN DOSE IS 4000 MG PER DAY   simvastatin (ZOCOR) 10 MG tablet TAKE 1 TABLET DAILY   naloxone (NARCAN) nasal spray 4 mg/0.1 mL Place 1 spray into the nose as needed (opioid overdose).   No facility-administered encounter medications on file as of 10/17/2021.    Allergies (verified) Shellfish allergy, Leflunomide, Methotrexate, Canagliflozin, Cephalexin, Doxycycline, and Dulaglutide   History: Past Medical History:  Diagnosis Date   Anemia    taking iron   Arthritis    rheumatoid arthritis   Diabetes mellitus    Heart murmur    Hyperlipidemia  Hypertension    Primary localized osteoarthritis of right knee 05/13/2015   Scapho-lunate dissociation, right    Vitamin D deficiency    Past Surgical History:  Procedure Laterality Date   CARDIAC CATHETERIZATION  2005   normal   CARPAL TUNNEL RELEASE  2009   both hands   COLONOSCOPY WITH PROPOFOL N/A 04/17/2021   Procedure: COLONOSCOPY WITH PROPOFOL;  Surgeon: Eloise Harman, DO;  [facility];  Service: Endoscopy;  Laterality: N/A;  8:15am,  ASA 2   HARDWARE REMOVAL Right 11/14/2017   Procedure: REMOVAL ACCUTRAC SCREW RIGHT WRIST;  Surgeon: Daryll Brod, MD;  Location: [place];  Service: Orthopedics;  Laterality: Right;   KNEE ARTHROSCOPY     rt   PARTIAL KNEE ARTHROPLASTY Right 05/13/2015   Procedure: RIGHT UNI KNEE ARTHROPLASTY;  Surgeon: Marchia Bond, MD;  Location: [place];  Service: Orthopedics;  Laterality: Right;   POLYPECTOMY  04/17/2021   Procedure: POLYPECTOMY;  Surgeon: Eloise Harman, DO;  [facility];  Service: Endoscopy;;   scaphoid excision Left 2012   Dr Daylene Katayama   TONSILLECTOMY     as a child   TOTAL SHOULDER ARTHROPLASTY Right 08/23/2020   Procedure: TOTAL SHOULDER ARTHROPLASTY;  Surgeon: Marchia Bond, MD;  Location: [facility];  Service: Orthopedics;  Laterality: Right;   WRIST FUSION Right 03/14/2017   Procedure: SCAPHOID EXCISION ULNAR FOUR BONE FUSION RIGHT;  Surgeon: Daryll Brod, MD;  Location: [place];  Service: Orthopedics;  Laterality: Right;   Family History  Problem Relation Age of Onset   Diabetes Father    Hypertension Father    Heart Problems Father    Stroke Father    Arthritis Sister    Arthritis Sister    Diabetes Maternal Grandmother    Arthritis Daughter    Colon cancer Neg Hx    Social History   Socioeconomic History   Marital status: Married    Spouse name: [name]   Number of children: 2   Years of education: Not on file   Highest education level: Not on file  Occupational History   Occupation: Disabled  Tobacco Use   Smoking status: Former    Types: Cigarettes    Quit date: 03/05/1991    Years since quitting: 30.6   Smokeless tobacco: Never  Vaping Use   Vaping Use: Never used  Substance and Sexual Activity   Alcohol use: Yes    Comment: rare   Drug use: No   Sexual activity: Not on file  Other Topics Concern   Not on file  Social History Narrative   Children live in [place] Strain: Low Risk  (10/17/2021)   Overall Financial Resource Strain (CARDIA)    Difficulty of Paying Living Expenses: Not hard at all  Food Insecurity: No Food Insecurity (10/17/2021)   Hunger Vital Sign    Worried About Running Out of Food in the Last Year: Never true    [place] in the Last Year: Never true  Transportation Needs: No Transportation Needs (10/17/2021)   PRAPARE - Hydrologist (Medical): No    Lack of Transportation (Non-Medical): No  Physical Activity: Sufficiently Active (10/17/2021)   Exercise Vital Sign    Days of Exercise per Week: 7 days    Minutes of Exercise per Session: 30 min  Stress: No Stress Concern Present (10/17/2021)   [employer]  [facility] [place]    Feeling of Stress : Not at all  Social Connections: Socially Integrated (10/17/2021)   Social Connection and Isolation Panel [NHANES]    Frequency of Communication with Friends and Family: More than three times a week    Frequency of Social Gatherings with Friends and Family: More than three times a week    Attends Religious Services: More than 4 times per year    Active Member of [employer] or Organizations: Yes    Attends Music therapist: More than 4 times per year    Marital Status: Married    Tobacco Counseling Counseling given: Not Answered   Clinical Intake:  Pre-visit preparation completed: Yes  Pain : 0-10 Pain Score: 3  Pain Type: Chronic pain Pain Location: Generalized Pain Descriptors / Indicators: Discomfort Pain Onset: More than a month ago Pain Frequency: Intermittent     BMI - recorded: 36.64 Nutritional Status: BMI > 30  Obese Nutritional Risks: None Diabetes: Yes CBG done?: No Did pt. bring in CBG monitor from home?: No  How often do you need to have someone help you when you read instructions, pamphlets, or other written materials from your doctor or  pharmacy?: 1 - Never  Diabetic? Nutrition Risk Assessment:  Has the patient had any N/V/D within the last 2 months?  No  Does the patient have any non-healing wounds?  No  Has the patient had any unintentional weight loss or weight gain?  No   Diabetes:  Is the patient diabetic?  Yes  If diabetic, was a CBG obtained today?  No  Did the patient bring in their glucometer from home?  No  How often do you monitor your CBG's? Once or twice per day - 155 this am per patient.   Financial Strains and Diabetes Management:  Are you having any financial strains with the device, your supplies or your medication? No .  Does the patient want to be seen by Chronic Care Management for management of their diabetes?  No  Would the patient like to be referred to a Nutritionist or for Diabetic Management?  No   Diabetic Exams:  Diabetic Eye Exam: Completed 12/06/2020.   Diabetic Foot Exam: Completed 08/28/2021. Pt has been advised about the importance in completing this exam.    Interpreter Needed?: No  Information entered by :: Jaclin Finks, LPN   Activities of Daily Living    10/17/2021   10:39 AM  In your present state of health, do you have any difficulty performing the following activities:  Hearing? 0  Vision? 0  Difficulty concentrating or making decisions? 0  Walking or climbing stairs? 0  Dressing or bathing? 0  Doing errands, shopping? 0  Preparing Food and eating ? N  Using the Toilet? N  In the past six months, have you accidently leaked urine? N  Do you have problems with loss of bowel control? N  Managing your Medications? N  Managing your Finances? N  Housekeeping or managing your Housekeeping? N    Patient Care Team: Sharion Balloon, FNP as PCP - General (Nurse Practitioner) Josue Hector, MD as PCP - Cardiology (Cardiology) Gavin Pound, MD as Consulting Physician (Rheumatology) Josue Hector, MD as Consulting Physician (Cardiology) Jacelyn Pi, MD as  Referring Physician (Endocrinology) Derek Jack, MD as Consulting Physician (Hematology) Marchia Bond, MD as Consulting Physician (Orthopedic Surgery) Jeanella Anton, NP as Nurse Practitioner (Nurse Practitioner)  Indicate any recent Medical Services you  may have received from other than [facility] providers in the past year (date may be approximate).     Assessment:   This is a routine wellness examination for [name].  Hearing/Vision screen Hearing Screening - Comments:: Denies hearing difficulties   Vision Screening - Comments:: Wears reading glasses prn - up to date with routine eye exams with optometrist in [place] issues and exercise activities discussed: Current Exercise Habits: Home exercise routine, Type of exercise: Other - see comments (stationary bike, jumping on trampoline), Time (Minutes): 30, Frequency (Times/Week): 7, Weekly Exercise (Minutes/Week): 210, Intensity: Mild, Exercise limited by: orthopedic condition(s)   Goals Addressed             This Visit's Progress    Exercise 3x per week (30 min per time)   On track      Depression Screen    10/17/2021   10:38 AM 10/14/2020    2:32 PM 05/31/2020    8:07 AM 05/04/2020   11:32 AM 02/26/2020    8:30 AM 11/26/2019    9:41 AM 11/18/2018    8:09 AM  PHQ 2/9 Scores  PHQ - 2 Score 0 0 0 0 0 0 0  PHQ- 9 Score      0     Fall Risk    10/17/2021   10:34 AM 10/14/2020    2:40 PM 05/31/2020    8:07 AM 05/04/2020   11:32 AM 02/26/2020    8:30 AM  Fall Risk   Falls in the past year? 0 0 0 0 0  Number falls in past yr: 0 0     Injury with Fall? 0 0     Risk for fall due to : Orthopedic patient Orthopedic patient;Impaired vision     Follow up Falls prevention discussed Falls prevention discussed       FALL RISK PREVENTION PERTAINING TO THE HOME:  Any stairs in or around the home? Yes  If so, are there any without handrails? Yes  Home free of loose throw rugs in walkways, pet beds, electrical  cords, etc? Yes  Adequate lighting in your home to reduce risk of falls? Yes   ASSISTIVE DEVICES UTILIZED TO PREVENT FALLS:  Life alert? No  Use of a cane, walker or w/c? No  Grab bars in the bathroom? No  Shower chair or bench in shower? No  Elevated toilet seat or a handicapped toilet? Yes   TIMED UP AND GO:  Was the test performed? No . Telephonic visit  Cognitive Function:        10/17/2021   10:40 AM 10/14/2020    2:41 PM  6CIT Screen  What Year? 0 points 0 points  What month? 0 points 0 points  What time? 0 points 0 points  Count back from 20 0 points 0 points  Months in reverse 0 points 0 points  Repeat phrase 0 points 0 points  Total Score 0 points 0 points    Immunizations Immunization History  Administered Date(s) Administered   Janssen (J&J) SARS-COV-2 Vaccination 08/25/2019   Pneumococcal Polysaccharide-23 09/23/2010   Tdap 11/23/2010    TDAP status: Due, Education has been provided regarding the importance of this vaccine. Advised may receive this vaccine at local pharmacy or Health Dept. Aware to provide a copy of the vaccination record if obtained from local pharmacy or Health Dept. Verbalized acceptance and understanding.  Flu Vaccine status: Declined, Education has been provided regarding the importance of this vaccine but patient still  declined. Advised may receive this vaccine at local pharmacy or Health Dept. Aware to provide a copy of the vaccination record if obtained from local pharmacy or Health Dept. Verbalized acceptance and understanding.  Pneumococcal vaccine status: Due, Education has been provided regarding the importance of this vaccine. Advised may receive this vaccine at local pharmacy or Health Dept. Aware to provide a copy of the vaccination record if obtained from local pharmacy or Health Dept. Verbalized acceptance and understanding.  Covid-19 vaccine status: Information provided on how to obtain vaccines.   Qualifies for Shingles  Vaccine? Yes   Zostavax completed No   Shingrix Completed?: No.    Education has been provided regarding the importance of this vaccine. Patient has been advised to call insurance company to determine out of pocket expense if they have not yet received this vaccine. Advised may also receive vaccine at local pharmacy or Health Dept. Verbalized acceptance and understanding.  Screening Tests Health Maintenance  Topic Date Due   Zoster Vaccines- Shingrix (1 of 2) Never done   Pneumonia Vaccine 52+ Years old (2 - PCV) 07/11/2018   COVID-19 Vaccine (2 - Janssen risk series) 09/22/2019   TETANUS/TDAP  11/22/2020   INFLUENZA VACCINE  11/07/2021   OPHTHALMOLOGY EXAM  12/06/2021   HEMOGLOBIN A1C  02/28/2022   FOOT EXAM  08/29/2022   COLONOSCOPY (Pts 45-17yr Insurance coverage will need to be confirmed)  04/17/2026   Hepatitis C Screening  Completed   HPV VACCINES  Aged Out   Fecal DNA (Cologuard)  Discontinued    Health Maintenance  Health Maintenance Due  Topic Date Due   Zoster Vaccines- Shingrix (1 of 2) Never done   Pneumonia Vaccine 68 Years old (2 - PCV) 07/11/2018   COVID-19 Vaccine (2 - Janssen risk series) 09/22/2019   TETANUS/TDAP  11/22/2020    Colorectal cancer screening: Type of screening: Colonoscopy. Completed 04/17/2021. Repeat every 5 years  Lung Cancer Screening: (Low Dose CT Chest recommended if Age 68-80years, 30 pack-year currently smoking OR have quit w/in 15years.) does not qualify.   Additional Screening:  Hepatitis C Screening: does qualify; Completed 12/09/2014  Vision Screening: Recommended annual ophthalmology exams for early detection of glaucoma and other disorders of the eye. Is the patient up to date with their annual eye exam?  Yes  Who is the provider or what is the name of the office in which the patient attends annual eye exams? [facility] If pt is not established with a provider, would they like to be referred to a provider to  establish care? No .   Dental Screening: Recommended annual dental exams for proper oral hygiene  Community Resource Referral / Chronic Care Management: CRR required this visit?  No   CCM required this visit?  No      Plan:     I have personally reviewed and noted the following in the patient's chart:   Medical and social history Use of alcohol, tobacco or illicit drugs  Current medications and supplements including opioid prescriptions. Patient is currently taking opioid prescriptions. Information provided to patient regarding non-opioid alternatives. Patient advised to discuss non-opioid treatment plan with their provider. Functional ability and status Nutritional status Physical activity Advanced directives List of other physicians Hospitalizations, surgeries, and ER visits in previous 12 months Vitals Screenings to include cognitive, depression, and falls Referrals and appointments  In addition, I have reviewed and discussed with patient certain preventive protocols, quality metrics, and best practice recommendations. A written personalized care  plan for preventive services as well as general preventive health recommendations were provided to patient.     Sandrea Hammond, LPN   2/99/[id number]   Nurse Notes: None

## 2021-10-17 NOTE — Patient Instructions (Signed)
[name] , Thank you for taking time to come for your Medicare Wellness Visit. I appreciate your ongoing commitment to your health goals. Please review the following plan we discussed and let me know if I can assist you in the future.   Screening recommendations/referrals: Colonoscopy: Done 04/17/2021 - Repeat in 5 years  Recommended yearly ophthalmology/optometry visit for glaucoma screening and checkup Recommended yearly dental visit for hygiene and checkup  Vaccinations: Influenza vaccine: Declined - recommended annually in fall Pneumococcal vaccine: Pneumovax Done 09/23/2010 - due for Prevnar Tdap vaccine: Done 11/23/2010 - Repeat in 10 years *due Shingles vaccine: Declined - Shingrix is 2 doses 2-6 months apart and over 90% effective     Covid-19: Janssen done 08/25/2019 - declines boosters  Advanced directives: Advance directive discussed with you today. Even though you declined this today, please call our office should you change your mind, and we can give you the proper paperwork for you to fill out.    Conditions/risks identified: Keep up the great work! Aim for 30 minutes of exercise or brisk walking, 6-8 glasses of water, and 5 servings of fruits and vegetables each day.   Next appointment: Follow up in one year for your annual wellness visit.   Preventive Care 65 Years and Older, Male  Preventive care refers to lifestyle choices and visits with your health care provider that can promote health and wellness. What does preventive care include? A yearly physical exam. This is also called an annual well check. Dental exams once or twice a year. Routine eye exams. Ask your health care provider how often you should have your eyes checked. Personal lifestyle choices, including: Daily care of your teeth and gums. Regular physical activity. Eating a healthy diet. Avoiding tobacco and drug use. Limiting alcohol use. Practicing safe sex. Taking low doses of aspirin every day. Taking  vitamin and mineral supplements as recommended by your health care provider. What happens during an annual well check? The services and screenings done by your health care provider during your annual well check will depend on your age, overall health, lifestyle risk factors, and family history of disease. Counseling  Your health care provider may ask you questions about your: Alcohol use. Tobacco use. Drug use. Emotional well-being. Home and relationship well-being. Sexual activity. Eating habits. History of falls. Memory and ability to understand (cognition). Work and work Statistician. Screening  You may have the following tests or measurements: Height, weight, and BMI. Blood pressure. Lipid and cholesterol levels. These may be checked every 5 years, or more frequently if you are over 59 years old. Skin check. Lung cancer screening. You may have this screening every year starting at age 71 if you have a 30-pack-year history of smoking and currently smoke or have quit within the past 15 years. Fecal occult blood test (FOBT) of the stool. You may have this test every year starting at age 19. Flexible sigmoidoscopy or colonoscopy. You may have a sigmoidoscopy every 5 years or a colonoscopy every 10 years starting at age 60. Prostate cancer screening. Recommendations will vary depending on your family history and other risks. Hepatitis C blood test. Hepatitis B blood test. Sexually transmitted disease (STD) testing. Diabetes screening. This is done by checking your blood sugar (glucose) after you have not eaten for a while (fasting). You may have this done every 1-3 years. Abdominal aortic aneurysm (AAA) screening. You may need this if you are a current or former smoker. Osteoporosis. You may be screened starting at age  70 if you are at high risk. Talk with your health care provider about your test results, treatment options, and if necessary, the need for more tests. Vaccines  Your  health care provider may recommend certain vaccines, such as: Influenza vaccine. This is recommended every year. Tetanus, diphtheria, and acellular pertussis (Tdap, Td) vaccine. You may need a Td booster every 10 years. Zoster vaccine. You may need this after age 65. Pneumococcal 13-valent conjugate (PCV13) vaccine. One dose is recommended after age 23. Pneumococcal polysaccharide (PPSV23) vaccine. One dose is recommended after age 28. Talk to your health care provider about which screenings and vaccines you need and how often you need them. This information is not intended to replace advice given to you by your health care provider. Make sure you discuss any questions you have with your health care provider. Document Released: 04/22/2015 Document Revised: 12/14/2015 Document Reviewed: 01/25/2015 Elsevier Interactive Patient Education  2017 [place] Prevention in the Home Falls can cause injuries. They can happen to people of all ages. There are many things you can do to make your home safe and to help prevent falls. What can I do on the outside of my home? Regularly fix the edges of walkways and driveways and fix any cracks. Remove anything that might make you trip as you walk through a door, such as a raised step or threshold. Trim any bushes or trees on the path to your home. Use bright outdoor lighting. Clear any walking paths of anything that might make someone trip, such as rocks or tools. Regularly check to see if handrails are loose or broken. Make sure that both sides of any steps have handrails. Any raised decks and porches should have guardrails on the edges. Have any leaves, snow, or ice cleared regularly. Use sand or salt on walking paths during winter. Clean up any spills in your garage right away. This includes oil or grease spills. What can I do in the bathroom? Use night lights. Install grab bars by the toilet and in the tub and shower. Do not use towel bars as  grab bars. Use non-skid mats or decals in the tub or shower. If you need to sit down in the shower, use a plastic, non-slip stool. Keep the floor dry. Clean up any water that spills on the floor as soon as it happens. Remove soap buildup in the tub or shower regularly. Attach bath mats securely with double-sided non-slip rug tape. Do not have throw rugs and other things on the floor that can make you trip. What can I do in the bedroom? Use night lights. Make sure that you have a light by your bed that is easy to reach. Do not use any sheets or blankets that are too big for your bed. They should not hang down onto the floor. Have a firm chair that has side arms. You can use this for support while you get dressed. Do not have throw rugs and other things on the floor that can make you trip. What can I do in the kitchen? Clean up any spills right away. Avoid walking on wet floors. Keep items that you use a lot in easy-to-reach places. If you need to reach something above you, use a strong step stool that has a grab bar. Keep electrical cords out of the way. Do not use floor polish or wax that makes floors slippery. If you must use wax, use non-skid floor wax. Do not have throw rugs and other  things on the floor that can make you trip. What can I do with my stairs? Do not leave any items on the stairs. Make sure that there are handrails on both sides of the stairs and use them. Fix handrails that are broken or loose. Make sure that handrails are as long as the stairways. Check any carpeting to make sure that it is firmly attached to the stairs. Fix any carpet that is loose or worn. Avoid having throw rugs at the top or bottom of the stairs. If you do have throw rugs, attach them to the floor with carpet tape. Make sure that you have a light switch at the top of the stairs and the bottom of the stairs. If you do not have them, ask someone to add them for you. What else can I do to help prevent  falls? Wear shoes that: Do not have high heels. Have rubber bottoms. Are comfortable and fit you well. Are closed at the toe. Do not wear sandals. If you use a stepladder: Make sure that it is fully opened. Do not climb a closed stepladder. Make sure that both sides of the stepladder are locked into place. Ask someone to hold it for you, if possible. Clearly mark and make sure that you can see: Any grab bars or handrails. First and last steps. Where the edge of each step is. Use tools that help you move around (mobility aids) if they are needed. These include: Canes. Walkers. Scooters. Crutches. Turn on the lights when you go into a dark area. Replace any light bulbs as soon as they burn out. Set up your furniture so you have a clear path. Avoid moving your furniture around. If any of your floors are uneven, fix them. If there are any pets around you, be aware of where they are. Review your medicines with your doctor. Some medicines can make you feel dizzy. This can increase your chance of falling. Ask your doctor what other things that you can do to help prevent falls. This information is not intended to replace advice given to you by your health care provider. Make sure you discuss any questions you have with your health care provider. Document Released: 01/20/2009 Document Revised: 09/01/2015 Document Reviewed: 04/30/2014 Elsevier Interactive Patient Education  2017 [employer].

## 2021-10-23 DIAGNOSIS — G8929 Other chronic pain: Secondary | ICD-10-CM | POA: Diagnosis not present

## 2021-10-23 DIAGNOSIS — M4310 Spondylolisthesis, site unspecified: Secondary | ICD-10-CM | POA: Diagnosis not present

## 2021-10-23 DIAGNOSIS — M545 Low back pain, unspecified: Secondary | ICD-10-CM | POA: Diagnosis not present

## 2021-10-23 DIAGNOSIS — Z6833 Body mass index (BMI) 33.0-33.9, adult: Secondary | ICD-10-CM | POA: Diagnosis not present

## 2021-10-23 DIAGNOSIS — E119 Type 2 diabetes mellitus without complications: Secondary | ICD-10-CM | POA: Diagnosis not present

## 2021-10-23 DIAGNOSIS — Z79899 Other long term (current) drug therapy: Secondary | ICD-10-CM | POA: Diagnosis not present

## 2021-10-23 DIAGNOSIS — G894 Chronic pain syndrome: Secondary | ICD-10-CM | POA: Diagnosis not present

## 2021-10-23 DIAGNOSIS — R030 Elevated blood-pressure reading, without diagnosis of hypertension: Secondary | ICD-10-CM | POA: Diagnosis not present

## 2021-10-24 DIAGNOSIS — M0609 Rheumatoid arthritis without rheumatoid factor, multiple sites: Secondary | ICD-10-CM | POA: Diagnosis not present

## 2021-10-30 DIAGNOSIS — Z6834 Body mass index (BMI) 34.0-34.9, adult: Secondary | ICD-10-CM | POA: Diagnosis not present

## 2021-10-30 DIAGNOSIS — E669 Obesity, unspecified: Secondary | ICD-10-CM | POA: Diagnosis not present

## 2021-10-30 DIAGNOSIS — M1991 Primary osteoarthritis, unspecified site: Secondary | ICD-10-CM | POA: Diagnosis not present

## 2021-10-30 DIAGNOSIS — Z79899 Other long term (current) drug therapy: Secondary | ICD-10-CM | POA: Diagnosis not present

## 2021-10-30 DIAGNOSIS — M0609 Rheumatoid arthritis without rheumatoid factor, multiple sites: Secondary | ICD-10-CM | POA: Diagnosis not present

## 2021-10-30 DIAGNOSIS — M2550 Pain in unspecified joint: Secondary | ICD-10-CM | POA: Diagnosis not present

## 2021-11-21 DIAGNOSIS — M0609 Rheumatoid arthritis without rheumatoid factor, multiple sites: Secondary | ICD-10-CM | POA: Diagnosis not present

## 2021-11-23 DIAGNOSIS — E559 Vitamin D deficiency, unspecified: Secondary | ICD-10-CM | POA: Diagnosis not present

## 2021-11-23 DIAGNOSIS — M4310 Spondylolisthesis, site unspecified: Secondary | ICD-10-CM | POA: Diagnosis not present

## 2021-11-23 DIAGNOSIS — M545 Low back pain, unspecified: Secondary | ICD-10-CM | POA: Diagnosis not present

## 2021-11-23 DIAGNOSIS — R030 Elevated blood-pressure reading, without diagnosis of hypertension: Secondary | ICD-10-CM | POA: Diagnosis not present

## 2021-11-23 DIAGNOSIS — E119 Type 2 diabetes mellitus without complications: Secondary | ICD-10-CM | POA: Diagnosis not present

## 2021-11-23 DIAGNOSIS — G8929 Other chronic pain: Secondary | ICD-10-CM | POA: Diagnosis not present

## 2021-11-23 DIAGNOSIS — G894 Chronic pain syndrome: Secondary | ICD-10-CM | POA: Diagnosis not present

## 2021-11-23 DIAGNOSIS — Z79899 Other long term (current) drug therapy: Secondary | ICD-10-CM | POA: Diagnosis not present

## 2021-11-23 DIAGNOSIS — Z6833 Body mass index (BMI) 33.0-33.9, adult: Secondary | ICD-10-CM | POA: Diagnosis not present

## 2021-12-04 DIAGNOSIS — D1801 Hemangioma of skin and subcutaneous tissue: Secondary | ICD-10-CM | POA: Diagnosis not present

## 2021-12-04 DIAGNOSIS — L820 Inflamed seborrheic keratosis: Secondary | ICD-10-CM | POA: Diagnosis not present

## 2021-12-04 DIAGNOSIS — L219 Seborrheic dermatitis, unspecified: Secondary | ICD-10-CM | POA: Diagnosis not present

## 2021-12-04 DIAGNOSIS — L578 Other skin changes due to chronic exposure to nonionizing radiation: Secondary | ICD-10-CM | POA: Diagnosis not present

## 2021-12-04 DIAGNOSIS — L821 Other seborrheic keratosis: Secondary | ICD-10-CM | POA: Diagnosis not present

## 2021-12-04 DIAGNOSIS — L570 Actinic keratosis: Secondary | ICD-10-CM | POA: Diagnosis not present

## 2021-12-04 DIAGNOSIS — L920 Granuloma annulare: Secondary | ICD-10-CM | POA: Diagnosis not present

## 2021-12-08 DIAGNOSIS — E119 Type 2 diabetes mellitus without complications: Secondary | ICD-10-CM | POA: Diagnosis not present

## 2021-12-08 DIAGNOSIS — H11003 Unspecified pterygium of eye, bilateral: Secondary | ICD-10-CM | POA: Diagnosis not present

## 2021-12-08 DIAGNOSIS — H25813 Combined forms of age-related cataract, bilateral: Secondary | ICD-10-CM | POA: Diagnosis not present

## 2021-12-08 DIAGNOSIS — H43812 Vitreous degeneration, left eye: Secondary | ICD-10-CM | POA: Diagnosis not present

## 2021-12-26 DIAGNOSIS — G8929 Other chronic pain: Secondary | ICD-10-CM | POA: Diagnosis not present

## 2021-12-26 DIAGNOSIS — R030 Elevated blood-pressure reading, without diagnosis of hypertension: Secondary | ICD-10-CM | POA: Diagnosis not present

## 2021-12-26 DIAGNOSIS — G894 Chronic pain syndrome: Secondary | ICD-10-CM | POA: Diagnosis not present

## 2021-12-26 DIAGNOSIS — Z6833 Body mass index (BMI) 33.0-33.9, adult: Secondary | ICD-10-CM | POA: Diagnosis not present

## 2021-12-26 DIAGNOSIS — M4310 Spondylolisthesis, site unspecified: Secondary | ICD-10-CM | POA: Diagnosis not present

## 2021-12-26 DIAGNOSIS — M0609 Rheumatoid arthritis without rheumatoid factor, multiple sites: Secondary | ICD-10-CM | POA: Diagnosis not present

## 2021-12-26 DIAGNOSIS — E119 Type 2 diabetes mellitus without complications: Secondary | ICD-10-CM | POA: Diagnosis not present

## 2021-12-26 DIAGNOSIS — Z79899 Other long term (current) drug therapy: Secondary | ICD-10-CM | POA: Diagnosis not present

## 2021-12-26 DIAGNOSIS — M545 Low back pain, unspecified: Secondary | ICD-10-CM | POA: Diagnosis not present

## 2021-12-28 ENCOUNTER — Encounter: Payer: Self-pay | Admitting: Family Medicine

## 2021-12-28 ENCOUNTER — Ambulatory Visit: Payer: Medicare Other | Admitting: Family

## 2021-12-28 ENCOUNTER — Ambulatory Visit (INDEPENDENT_AMBULATORY_CARE_PROVIDER_SITE_OTHER): Payer: Medicare Other | Admitting: Family Medicine

## 2021-12-28 VITALS — BP 138/84 | HR 78 | Temp 98.2°F | Ht 68.0 in | Wt 232.0 lb

## 2021-12-28 DIAGNOSIS — K120 Recurrent oral aphthae: Secondary | ICD-10-CM | POA: Diagnosis not present

## 2021-12-28 MED ORDER — NYSTATIN 100000 UNIT/ML MT SUSP: 5.0000 mL | Freq: Four times a day (QID) | OROMUCOSAL | 2 refills | Status: DC | PRN

## 2021-12-28 NOTE — Progress Notes (Signed)
BP 138/84   Pulse 78   Temp 98.2 F (36.8 C)   Ht 5' 8"  (1.727 m)   Wt 232 lb (105.2 kg)   SpO2 94%   BMI 35.28 kg/m    Subjective:   Patient ID: [name], male    DOB: 03/09/1956, 66 y.o.   MRN: [id number]  HPI: [name] is a 66 y.o. male presenting on 12/28/2021 for Spot in roof of mouth (Present for 2 weeks, has bleeding with brushing, painful to eat)   HPI Sore in throat and mouth Patient comes in with a sore in his mouth, he says been there for 2 weeks and has been irritated and bleeding with brushing and painful to eat.  He says he does have dentures and is on the roof of his palate and the dentures cover the roof of his palate he says he cannot remove them right now because he has to eat later.  Relevant past medical, surgical, family and social history reviewed and updated as indicated. Interim medical history since our last visit reviewed. Allergies and medications reviewed and updated.  Review of Systems  Constitutional:  Negative for chills and fever.  HENT:  Positive for mouth sores.   Respiratory:  Negative for shortness of breath and wheezing.   Cardiovascular:  Negative for chest pain and leg swelling.  Skin:  Negative for rash.  All other systems reviewed and are negative.   Per HPI unless specifically indicated above   Allergies as of 12/28/2021       Reactions   Shellfish Allergy Shortness Of Breath, Swelling   Leflunomide    LFT ELEVATION   Methotrexate    LFT elevation   Canagliflozin Other (See Comments)   Causes bladder infections   Cephalexin Other (See Comments)   nervous   Doxycycline Nausea And Vomiting   Dulaglutide Diarrhea, Nausea Only        Medication List        Accurate as of December 28, 2021  4:17 PM. If you have any questions, ask your nurse or doctor.          aspirin 81 MG chewable tablet Chew 81 mg by mouth daily.   glimepiride 2 MG tablet Commonly known as: AMARYL Take 1 tablet (2 mg total) by  mouth daily. What changed: when to take this   hydrochlorothiazide 12.5 MG capsule Commonly known as: MICROZIDE TAKE (1) CAPSULE DAILY   ketoconazole 2 % cream Commonly known as: NIZORAL Apply 1 application. topically daily.   losartan 100 MG tablet Commonly known as: COZAAR TAKE 1 TABLET DAILY   magic mouthwash (nystatin, lidocaine, diphenhydrAMINE, alum & mag hydroxide) suspension Swish and swallow 5 mLs 4 (four) times daily as needed for mouth pain.   meloxicam 15 MG tablet Commonly known as: MOBIC TAKE 1 TABELT ONCE DAILY   metFORMIN 1000 MG tablet Commonly known as: GLUCOPHAGE TAKE 1 TABLET IN THE MORNING AND 1&1/2 TABLETS IN THE EVENING   Multi For Him Tabs Take 1 tablet by mouth daily.   naloxone 4 MG/0.1ML Liqd nasal spray kit Commonly known as: NARCAN Place 1 spray into the nose as needed (opioid overdose).   oxyCODONE-acetaminophen 10-325 MG tablet Commonly known as: Percocet Take 1-2 tablets by mouth every 6 (six) hours as needed for pain. MAXIMUM TOTAL ACETAMINOPHEN DOSE IS 4000 MG PER DAY   simvastatin 10 MG tablet Commonly known as: ZOCOR TAKE 1 TABLET DAILY  Objective:   BP 138/84   Pulse 78   Temp 98.2 F (36.8 C)   Ht 5' 8"  (1.727 m)   Wt 232 lb (105.2 kg)   SpO2 94%   BMI 35.28 kg/m   Wt Readings from Last 3 Encounters:  12/28/21 232 lb (105.2 kg)  10/17/21 241 lb (109.3 kg)  08/28/21 241 lb (109.3 kg)    Physical Exam Vitals and nursing note reviewed.  Constitutional:      Appearance: Normal appearance.  HENT:     Mouth/Throat:     Mouth: Mucous membranes are moist.     Pharynx: Oropharynx is clear.     Comments: Patient says the sores on the roof of his mouth underneath his dentures but he does not want to put his dentures today because he has to eat later so I cannot see it. Neurological:     Mental Status: He is alert.       Assessment & Plan:   Problem List Items Addressed This Visit   None Visit Diagnoses      Oral aphthous ulcer    -  Primary   Relevant Medications   magic mouthwash (nystatin, lidocaine, diphenhydrAMINE, alum & mag hydroxide) suspension     Blood treat like oral aphthous ulcers because I cannot see it but instructed him that if is not better in a week or 2 then please come back so we can look at it  Follow up plan: Return if symptoms worsen or fail to improve.  Counseling provided for all of the vaccine components No orders of the defined types were placed in this encounter.   Caryl Pina, MD [facility] 12/28/2021, 4:17 PM

## 2022-01-03 DIAGNOSIS — H25812 Combined forms of age-related cataract, left eye: Secondary | ICD-10-CM | POA: Diagnosis not present

## 2022-01-15 ENCOUNTER — Other Ambulatory Visit: Payer: Medicare Other

## 2022-01-15 DIAGNOSIS — E559 Vitamin D deficiency, unspecified: Secondary | ICD-10-CM | POA: Diagnosis not present

## 2022-01-15 DIAGNOSIS — E1165 Type 2 diabetes mellitus with hyperglycemia: Secondary | ICD-10-CM | POA: Diagnosis not present

## 2022-01-15 DIAGNOSIS — E7800 Pure hypercholesterolemia, unspecified: Secondary | ICD-10-CM | POA: Diagnosis not present

## 2022-01-15 DIAGNOSIS — E11319 Type 2 diabetes mellitus with unspecified diabetic retinopathy without macular edema: Secondary | ICD-10-CM | POA: Diagnosis not present

## 2022-01-22 DIAGNOSIS — E11319 Type 2 diabetes mellitus with unspecified diabetic retinopathy without macular edema: Secondary | ICD-10-CM | POA: Diagnosis not present

## 2022-01-22 DIAGNOSIS — E7800 Pure hypercholesterolemia, unspecified: Secondary | ICD-10-CM | POA: Diagnosis not present

## 2022-01-22 DIAGNOSIS — D509 Iron deficiency anemia, unspecified: Secondary | ICD-10-CM | POA: Diagnosis not present

## 2022-01-22 DIAGNOSIS — E1165 Type 2 diabetes mellitus with hyperglycemia: Secondary | ICD-10-CM | POA: Diagnosis not present

## 2022-01-22 DIAGNOSIS — M069 Rheumatoid arthritis, unspecified: Secondary | ICD-10-CM | POA: Diagnosis not present

## 2022-01-22 DIAGNOSIS — I10 Essential (primary) hypertension: Secondary | ICD-10-CM | POA: Diagnosis not present

## 2022-01-22 DIAGNOSIS — E559 Vitamin D deficiency, unspecified: Secondary | ICD-10-CM | POA: Diagnosis not present

## 2022-01-23 DIAGNOSIS — Z6832 Body mass index (BMI) 32.0-32.9, adult: Secondary | ICD-10-CM | POA: Diagnosis not present

## 2022-01-23 DIAGNOSIS — G894 Chronic pain syndrome: Secondary | ICD-10-CM | POA: Diagnosis not present

## 2022-01-23 DIAGNOSIS — M4310 Spondylolisthesis, site unspecified: Secondary | ICD-10-CM | POA: Diagnosis not present

## 2022-01-23 DIAGNOSIS — M545 Low back pain, unspecified: Secondary | ICD-10-CM | POA: Diagnosis not present

## 2022-01-23 DIAGNOSIS — R030 Elevated blood-pressure reading, without diagnosis of hypertension: Secondary | ICD-10-CM | POA: Diagnosis not present

## 2022-01-23 DIAGNOSIS — G8929 Other chronic pain: Secondary | ICD-10-CM | POA: Diagnosis not present

## 2022-01-23 DIAGNOSIS — M0609 Rheumatoid arthritis without rheumatoid factor, multiple sites: Secondary | ICD-10-CM | POA: Diagnosis not present

## 2022-01-23 DIAGNOSIS — Z79899 Other long term (current) drug therapy: Secondary | ICD-10-CM | POA: Diagnosis not present

## 2022-01-23 DIAGNOSIS — E119 Type 2 diabetes mellitus without complications: Secondary | ICD-10-CM | POA: Diagnosis not present

## 2022-01-29 NOTE — Addendum Note (Signed)
Addended by: Renda Rolls A on: 01/29/2022 03:41 PM   Modules accepted: Orders

## 2022-02-02 ENCOUNTER — Other Ambulatory Visit: Payer: Medicare Other

## 2022-02-09 ENCOUNTER — Ambulatory Visit: Payer: Medicare Other | Admitting: Physician Assistant

## 2022-02-20 DIAGNOSIS — M545 Low back pain, unspecified: Secondary | ICD-10-CM | POA: Diagnosis not present

## 2022-02-20 DIAGNOSIS — R030 Elevated blood-pressure reading, without diagnosis of hypertension: Secondary | ICD-10-CM | POA: Diagnosis not present

## 2022-02-20 DIAGNOSIS — E119 Type 2 diabetes mellitus without complications: Secondary | ICD-10-CM | POA: Diagnosis not present

## 2022-02-20 DIAGNOSIS — M4310 Spondylolisthesis, site unspecified: Secondary | ICD-10-CM | POA: Diagnosis not present

## 2022-02-20 DIAGNOSIS — M0609 Rheumatoid arthritis without rheumatoid factor, multiple sites: Secondary | ICD-10-CM | POA: Diagnosis not present

## 2022-02-20 DIAGNOSIS — Z6833 Body mass index (BMI) 33.0-33.9, adult: Secondary | ICD-10-CM | POA: Diagnosis not present

## 2022-02-20 DIAGNOSIS — G894 Chronic pain syndrome: Secondary | ICD-10-CM | POA: Diagnosis not present

## 2022-02-20 DIAGNOSIS — G8929 Other chronic pain: Secondary | ICD-10-CM | POA: Diagnosis not present

## 2022-02-20 DIAGNOSIS — Z79899 Other long term (current) drug therapy: Secondary | ICD-10-CM | POA: Diagnosis not present

## 2022-02-22 NOTE — Progress Notes (Signed)
Patient ID: [name], male   DOB: [birth year], 66 y.o.   MRN: [id number]    66 y.o. with CRF;s central obesity, DM and HTN  Quit smoking in 1998 has significant arthritis That limits his activity Had normal myovue 03/28/16 for atypical chest pain Calcium socre 09/24/19 86 / 50 th percentile on statin   Had right wrist surgery with [name] 03/14/17 with no cardiac issues.  Had right total shoulder arthroplasty with Dr [name] 08/07/20 with no cardiac issues   Grand daughter in [name]  He has 2 kids and wife has 2   Riding stationary bike in garage Arthritis a bit worse in winter. A1c in 7 range So he is watching his diet more     ROS: Denies fever, malais, weight loss, blurry vision, decreased visual acuity, cough, sputum, SOB, hemoptysis, pleuritic pain, palpitaitons, heartburn, abdominal pain, melena, lower extremity edema, claudication, or rash.  All other systems reviewed and negative  General: Affect appropriate Obese HEENT: normal Neck supple with no adenopathy JVP normal no bruits no thyromegaly Lungs clear with no wheezing and good diaphragmatic motion Heart:  S1/S2 no murmur, no rub, gallop or click PMI normal Abdomen: benighn, BS positve, no tenderness, no AAA no bruit.  No HSM or HJR Distal pulses intact with no bruits No edema Neuro non-focal Skin warm and dry Right wrist less mobility post surgery  Post right shoulder replacement  Post right TKR     Current Outpatient Medications  Medication Sig Dispense Refill   abatacept (ORENCIA) 250 MG injection '1000mg'$  (4vials) Intravenous Q 4 weeks     aspirin 81 MG chewable tablet Chew 81 mg by mouth daily.     glimepiride (AMARYL) 2 MG tablet Take 1 tablet (2 mg total) by mouth daily. (Patient taking differently: Take 2 mg by mouth in the morning and at bedtime.) 90 tablet 2   hydrochlorothiazide (MICROZIDE) 12.5 MG capsule TAKE (1) CAPSULE DAILY 90 capsule 1   ketoconazole (NIZORAL) 2 % cream Apply 1 application.  topically daily. 60 g 2   losartan (COZAAR) 100 MG tablet TAKE 1 TABLET DAILY 90 tablet 1   meloxicam (MOBIC) 15 MG tablet TAKE 1 TABELT ONCE DAILY 90 tablet 1   metFORMIN (GLUCOPHAGE) 1000 MG tablet TAKE 1 TABLET IN THE MORNING AND 1&1/2 TABLETS IN THE EVENING 225 tablet 1   Multiple Vitamins-Minerals (MULTI FOR HIM) TABS Take 1 tablet by mouth daily.     naloxone (NARCAN) nasal spray 4 mg/0.1 mL Place 1 spray into the nose as needed (opioid overdose).     oxyCODONE-acetaminophen (PERCOCET) 10-325 MG tablet Take 1-2 tablets by mouth every 6 (six) hours as needed for pain. MAXIMUM TOTAL ACETAMINOPHEN DOSE IS 4000 MG PER DAY 50 tablet 0   simvastatin (ZOCOR) 10 MG tablet TAKE 1 TABLET DAILY 90 tablet 1   No current facility-administered medications for this visit.    Allergies  Shellfish allergy, Leflunomide, Methotrexate, Canagliflozin, Cephalexin, Doxycycline, and Dulaglutide  Electrocardiogram:  03/07/2022 NSR rate 61 normal   Assessment and Plan HTN: continue ARB and diuretic stable  FY:[id number] low carb diet.  Target hemoglobin A1c is 6.5 or less.  Continue current medications. Arthritis:continue monthly abatacept with rheum Involves hands/shoulders  Cholesterol is at goal.  Continue current dose of statin and diet Rx.  No myalgias or side effects.  F/U  LFT's in 6 months. Lab Results  Component Value Date   LDLCALC 56 08/28/2021   Chest Pain :  Atypical abnormal ECG  normal myovue 2017 calcium score 86 / 64 th percentile for age and sex continue statin He is riding a stationary bike regularly with no issues and defers any f/u stress testing   Anemia:  Iron deficiency Has had Feraheme infusion colonoscopy benign ? Malabsorption f/u hematology   F/u with me in a year     [name]

## 2022-03-05 ENCOUNTER — Encounter: Payer: Self-pay | Admitting: Family

## 2022-03-05 ENCOUNTER — Other Ambulatory Visit: Payer: Self-pay

## 2022-03-05 ENCOUNTER — Ambulatory Visit: Payer: Medicare Other | Admitting: Family

## 2022-03-05 ENCOUNTER — Ambulatory Visit (INDEPENDENT_AMBULATORY_CARE_PROVIDER_SITE_OTHER): Payer: Medicare Other | Admitting: Family

## 2022-03-05 VITALS — BP 135/82 | HR 59 | Temp 97.1°F | Ht 68.0 in | Wt 232.0 lb

## 2022-03-05 DIAGNOSIS — M059 Rheumatoid arthritis with rheumatoid factor, unspecified: Secondary | ICD-10-CM

## 2022-03-05 DIAGNOSIS — M19011 Primary osteoarthritis, right shoulder: Secondary | ICD-10-CM

## 2022-03-05 DIAGNOSIS — E538 Deficiency of other specified B group vitamins: Secondary | ICD-10-CM

## 2022-03-05 DIAGNOSIS — E1159 Type 2 diabetes mellitus with other circulatory complications: Secondary | ICD-10-CM

## 2022-03-05 DIAGNOSIS — D508 Other iron deficiency anemias: Secondary | ICD-10-CM | POA: Diagnosis not present

## 2022-03-05 DIAGNOSIS — E1169 Type 2 diabetes mellitus with other specified complication: Secondary | ICD-10-CM

## 2022-03-05 DIAGNOSIS — I152 Hypertension secondary to endocrine disorders: Secondary | ICD-10-CM

## 2022-03-05 DIAGNOSIS — M1711 Unilateral primary osteoarthritis, right knee: Secondary | ICD-10-CM

## 2022-03-05 DIAGNOSIS — D509 Iron deficiency anemia, unspecified: Secondary | ICD-10-CM

## 2022-03-05 DIAGNOSIS — E785 Hyperlipidemia, unspecified: Secondary | ICD-10-CM | POA: Diagnosis not present

## 2022-03-05 DIAGNOSIS — E1165 Type 2 diabetes mellitus with hyperglycemia: Secondary | ICD-10-CM

## 2022-03-05 LAB — BAYER DCA HB A1C WAIVED: HB A1C (BAYER DCA - WAIVED): 7.3 % — ABNORMAL HIGH (ref 4.8–5.6)

## 2022-03-05 NOTE — Patient Instructions (Signed)

## 2022-03-05 NOTE — Progress Notes (Signed)
Subjective:    Patient ID: [name], male    DOB: October 20, 1955, 66 y.o.   MRN: [id number]  Chief Complaint  Patient presents with   Medical Management of Chronic Issues   PT presnets to the office today for chronic follow up. Pt has rheumatoid arthritis and followed by rheumatologists every 6 months. Pt is followed by Cardiologists annually. He is followed by Hematologists for iron deficiency.     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    He is morbid obese with a BMI of 36 and DM and HTN.  Diabetes He presents for his follow-up diabetic visit. He has type 2 diabetes mellitus. There are no hypoglycemic associated symptoms. Pertinent negatives for diabetes include no blurred vision and no foot paresthesias. There are no hypoglycemic complications. Symptoms are stable. There are no diabetic complications. Risk factors for coronary artery disease include dyslipidemia, diabetes mellitus, male sex, hypertension and sedentary lifestyle. He is following a generally healthy diet. (Does not check at home) An ACE inhibitor/angiotensin II receptor blocker is being taken.  Hypertension This is a chronic problem. The current episode started more than 1 year ago. The problem has been resolved since onset. The problem is controlled. Pertinent negatives include no blurred vision, malaise/fatigue, peripheral edema or shortness of breath. Risk factors for coronary artery disease include dyslipidemia, diabetes mellitus, obesity, male gender and sedentary lifestyle. The current treatment provides moderate improvement.  Hyperlipidemia This is a chronic problem. The current episode started more than 1 year ago. The problem is controlled. Recent lipid tests were reviewed and are normal. Exacerbating diseases include obesity. Pertinent negatives include no shortness of breath. Current antihyperlipidemic treatment includes statins. The current treatment provides moderate improvement of lipids. Compliance  problems include psychosocial issues.  Risk factors for coronary artery disease include dyslipidemia, diabetes mellitus, male sex, hypertension and a sedentary lifestyle.  Arthritis Presents for follow-up visit. He complains of pain and stiffness. The symptoms have been stable. Affected locations include the left knee, right knee, left MCP and right MCP. His pain is at a severity of 5/10.  Anemia Presents for follow-up visit. There has been no malaise/fatigue.      Review of Systems  Constitutional:  Negative for malaise/fatigue.  Eyes:  Negative for blurred vision.  Respiratory:  Negative for shortness of breath.   Musculoskeletal:  Positive for arthritis and stiffness.  All other systems reviewed and are negative.      Objective:   Physical Exam Vitals reviewed.  Constitutional:      General: He is not in acute distress.    Appearance: He is well-developed.  HENT:     Head: Normocephalic.     Right Ear: Tympanic membrane normal.     Left Ear: Tympanic membrane normal.  Eyes:     General:        Right eye: No discharge.        Left eye: No discharge.     Pupils: Pupils are equal, round, and reactive to light.  Neck:     Thyroid: No thyromegaly.  Cardiovascular:     Rate and Rhythm: Normal rate and regular rhythm.     Heart sounds: Normal heart sounds. No murmur heard. Pulmonary:     Effort: Pulmonary effort is normal. No respiratory distress.     Breath sounds: Normal breath sounds. No wheezing.  Abdominal:     General: Bowel sounds are normal. There is no distension.     Palpations: Abdomen is soft.  Tenderness: There is no abdominal tenderness.  Musculoskeletal:        General: No tenderness. Normal range of motion.     Cervical back: Normal range of motion and neck supple.  Skin:    General: Skin is warm and dry.     Findings: No erythema or rash.  Neurological:     Mental Status: He is alert and oriented to person, place, and time.     Cranial Nerves: No  cranial nerve deficit.     Deep Tendon Reflexes: Reflexes are normal and symmetric.  Psychiatric:        Behavior: Behavior normal.        Thought Content: Thought content normal.        Judgment: Judgment normal.       BP 135/82   Pulse (!) 59   Temp (!) 97.1 F (36.2 C) (Temporal)   Ht _0  (1.727 m)   Wt 232 lb (105.2 kg)   SpO2 96%   BMI 35.28 kg/m      Assessment & Plan:  [name] comes in today with chief complaint of Medical Management of Chronic Issues   Diagnosis and orders addressed:  1. Type 2 diabetes mellitus with hyperglycemia, without long-term current use of insulin (HCC) - CMP14+EGFR - Bayer DCA Hb A1c Waived - Microalbumin / creatinine urine ratio  2. Hyperlipidemia associated with type 2 diabetes mellitus ([place]) - CMP14+EGFR  3. Iron deficiency anemia, unspecified iron deficiency anemia type - CMP14+EGFR  4. Microcytic anemia - CMP14+EGFR  5. Morbid obesity ([name]) - CMP14+EGFR  6. Osteoarthritis of right shoulder, unspecified osteoarthritis type - CMP14+EGFR  7. Rheumatoid arthritis with positive rheumatoid factor, involving unspecified site ([name]) - CMP14+EGFR  8. Primary localized osteoarthritis of right knee - CMP14+EGFR  9. Hypertension associated with diabetes ([name]) - CMP14+EGFR   Labs pending Health Maintenance reviewed Diet and exercise encouraged  Follow up plan: 6 months    Cameron Dun, FNP

## 2022-03-06 LAB — CMP14+EGFR
ALT: 25 IU/L (ref 0–44)
AST: 28 IU/L (ref 0–40)
Albumin/Globulin Ratio: 2.3 — ABNORMAL HIGH (ref 1.2–2.2)
Albumin: 4.8 g/dL (ref 3.9–4.9)
Alkaline Phosphatase: 69 IU/L (ref 44–121)
BUN/Creatinine Ratio: 17 (ref 10–24)
BUN: 19 mg/dL (ref 8–27)
Bilirubin Total: 0.4 mg/dL (ref 0.0–1.2)
CO2: 22 mmol/L (ref 20–29)
Calcium: 9.8 mg/dL (ref 8.6–10.2)
Chloride: 103 mmol/L (ref 96–106)
Creatinine, Ser: 1.1 mg/dL (ref 0.76–1.27)
Globulin, Total: 2.1 g/dL (ref 1.5–4.5)
Glucose: 159 mg/dL — ABNORMAL HIGH (ref 70–99)
Potassium: 4.5 mmol/L (ref 3.5–5.2)
Sodium: 143 mmol/L (ref 134–144)
Total Protein: 6.9 g/dL (ref 6.0–8.5)
eGFR: 73 mL/min/{1.73_m2} (ref >59)

## 2022-03-06 LAB — MICROALBUMIN / CREATININE URINE RATIO
Creatinine, Urine: 163.5 mg/dL
Microalb/Creat Ratio: 4 mg/g creat (ref 0–29)
Microalbumin, Urine: 5.8 ug/mL

## 2022-03-07 ENCOUNTER — Ambulatory Visit: Payer: Medicare Other | Attending: Cardiovascular Disease | Admitting: Cardiovascular Disease

## 2022-03-07 ENCOUNTER — Encounter: Payer: Self-pay | Admitting: Cardiovascular Disease

## 2022-03-07 VITALS — BP 140/80 | HR 61 | Ht 70.0 in | Wt 234.2 lb

## 2022-03-07 DIAGNOSIS — I10 Essential (primary) hypertension: Secondary | ICD-10-CM

## 2022-03-07 DIAGNOSIS — E782 Mixed hyperlipidemia: Secondary | ICD-10-CM

## 2022-03-07 DIAGNOSIS — I2510 Atherosclerotic heart disease of native coronary artery without angina pectoris: Secondary | ICD-10-CM | POA: Diagnosis not present

## 2022-03-07 LAB — CBC WITH DIFFERENTIAL/PLATELET
Basophils Absolute: 0.1 10*3/uL (ref 0.0–0.2)
Basos: 1 %
EOS (ABSOLUTE): 0.9 10*3/uL — ABNORMAL HIGH (ref 0.0–0.4)
Eos: 9 %
Hematocrit: 44.6 % (ref 37.5–51.0)
Hemoglobin: 15.2 g/dL (ref 13.0–17.7)
Immature Grans (Abs): 0 10*3/uL (ref 0.0–0.1)
Immature Granulocytes: 0 %
Lymphocytes Absolute: 3.1 10*3/uL (ref 0.7–3.1)
Lymphs: 34 %
MCH: 30.9 pg (ref 26.6–33.0)
MCHC: 34.1 g/dL (ref 31.5–35.7)
MCV: 91 fL (ref 79–97)
Monocytes Absolute: 0.5 10*3/uL (ref 0.1–0.9)
Monocytes: 5 %
Neutrophils Absolute: 4.5 10*3/uL (ref 1.4–7.0)
Neutrophils: 51 %
Platelets: 299 10*3/uL (ref 150–450)
RBC: 4.92 x10E6/uL (ref 4.14–5.80)
RDW: 11.7 % (ref 11.6–15.4)
WBC: 9.1 10*3/uL (ref 3.4–10.8)

## 2022-03-07 LAB — VITAMIN B12: Vitamin B-12: 677 pg/mL (ref 232–1245)

## 2022-03-07 LAB — IRON AND TIBC
Iron Saturation: 31 % (ref 15–55)
Iron: 112 ug/dL (ref 38–169)
Total Iron Binding Capacity: 357 ug/dL (ref 250–450)
UIBC: 245 ug/dL (ref 111–343)

## 2022-03-07 LAB — FERRITIN: Ferritin: 70 ng/mL (ref 30–400)

## 2022-03-07 LAB — METHYLMALONIC ACID, SERUM: Methylmalonic Acid: 96 nmol/L (ref 0–378)

## 2022-03-07 NOTE — Patient Instructions (Addendum)
Medication Instructions:  Your physician recommends that you continue on your current medications as directed. Please refer to the Current Medication list given to you today.  *If you need a refill on your cardiac medications before your next appointment, please call your pharmacy*  Lab Work: If you have labs (blood work) drawn today and your tests are completely normal, you will receive your results only by: [place] (if you have MyChart) OR A paper copy in the mail If you have any lab test that is abnormal or we need to change your treatment, we will call you to review the results.  Testing/Procedures: None ordered today.  Follow-Up: At [facility], you and your health needs are our priority.  As part of our continuing mission to provide you with exceptional heart care, we have created designated Provider Care Teams.  These Care Teams include your primary Cardiologist (physician) and Advanced Practice Providers (APPs -  Physician Assistants and Nurse Practitioners) who all work together to provide you with the care you need, when you need it.  We recommend signing up for the patient portal called "MyChart".  Sign up information is provided on this After Visit Summary.  MyChart is used to connect with patients for Virtual Visits (Telemedicine).  Patients are able to view lab/test results, encounter notes, upcoming appointments, etc.  Non-urgent messages can be sent to your provider as well.   To learn more about what you can do with MyChart, go to [email or web address].    Your next appointment:   12 month(s)  The format for your next appointment:   In Person  Provider:   Jenkins Rouge, MD     Important Information About Sugar

## 2022-03-07 NOTE — Progress Notes (Unsigned)
[place], [place] [address]   CLINIC:  Medical Oncology/Hematology  PCP:  Sharion Balloon, [address] [address] [phone]   REASON FOR VISIT:  Follow-up for iron deficiency and B12 deficiency  PRIOR THERAPY: Intermittent IV iron  CURRENT THERAPY: Oral iron tablets and B12 tablets  INTERVAL HISTORY:  Mr. Mcelhiney 66 y.o. male returns for routine follow-up of  iron deficiency anemia.  He was last evaluated via telemedicine visit by Tarri Abernethy PA-C on 10/06/2021.    At today's visit, he reports feeling well.  No recent hospitalizations, surgeries, or changes in baseline health status.  He denies any fatigue and reports that he has good energy.   He is taking iron tablet daily.  He takes B12 tablet "when he remembers it," about once every other week.   He denies any bleeding such as epistaxis, hematochezia, or melena.  He has some chronic intermittent restless leg symptoms.  He denies any pica, headaches, chest pain, dyspnea on exertion, lightheadedness, or syncope.  He continues to take Orencia for his rheumatoid arthritis.     He has 90% energy and 100% appetite. He endorses that he is maintaining a stable weight.   REVIEW OF SYSTEMS:  Review of Systems  Constitutional:  Negative for appetite change, chills, diaphoresis, fatigue, fever and unexpected weight change.  HENT:   Negative for lump/mass and nosebleeds.   Eyes:  Negative for eye problems.  Respiratory:  Negative for cough, hemoptysis and shortness of breath.   Cardiovascular:  Negative for chest pain, leg swelling and palpitations.  Gastrointestinal:  Negative for abdominal pain, blood in stool, constipation, diarrhea, nausea and vomiting.  Genitourinary:  Negative for hematuria.   Musculoskeletal:  Positive for arthralgias.  Skin: Negative.   Neurological:  Negative for dizziness, headaches and light-headedness.  Hematological:  Does not  bruise/bleed easily.      PAST MEDICAL/SURGICAL HISTORY:  Past Medical History:  Diagnosis Date   Anemia    taking iron   Arthritis    rheumatoid arthritis   Diabetes mellitus    Heart murmur    Hyperlipidemia    Hypertension    Primary localized osteoarthritis of right knee 05/13/2015   Scapho-lunate dissociation, right    Vitamin D deficiency    Past Surgical History:  Procedure Laterality Date   CARDIAC CATHETERIZATION  2005   normal   CARPAL TUNNEL RELEASE  2009   both hands   COLONOSCOPY WITH PROPOFOL N/A 04/17/2021   Procedure: COLONOSCOPY WITH PROPOFOL;  Surgeon: Eloise Harman, DO;  [facility];  Service: Endoscopy;  Laterality: N/A;  8:15am, ASA 2   HARDWARE REMOVAL Right 11/14/2017   Procedure: REMOVAL ACCUTRAC SCREW RIGHT WRIST;  Surgeon: Daryll Brod, MD;  Location: [facility];  Service: Orthopedics;  Laterality: Right;   KNEE ARTHROSCOPY     rt   PARTIAL KNEE ARTHROPLASTY Right 05/13/2015   Procedure: RIGHT UNI KNEE ARTHROPLASTY;  Surgeon: Marchia Bond, MD;  Location: [facility];  Service: Orthopedics;  Laterality: Right;   POLYPECTOMY  04/17/2021   Procedure: POLYPECTOMY;  Surgeon: Eloise Harman, DO;  [facility];  Service: Endoscopy;;   scaphoid excision Left 2012   Dr Daylene Katayama   TONSILLECTOMY     as a child   TOTAL SHOULDER ARTHROPLASTY Right 08/23/2020   Procedure: TOTAL SHOULDER ARTHROPLASTY;  Surgeon: Marchia Bond, MD;  Location: [facility];  Service: Orthopedics;  Laterality:  Right;   WRIST FUSION Right 03/14/2017   Procedure: SCAPHOID EXCISION ULNAR FOUR BONE FUSION RIGHT;  Surgeon: Daryll Brod, MD;  Location: [facility];  Service: Orthopedics;  Laterality: Right;     SOCIAL HISTORY:  Social History   Socioeconomic History   Marital status: Married    Spouse name: Helene Kelp   Number of children: 2   Years of education: Not on file   Highest education level: Not on file  Occupational  History   Occupation: Disabled  Tobacco Use   Smoking status: Former    Types: Cigarettes    Quit date: 03/05/1991    Years since quitting: 31.0   Smokeless tobacco: Never  Vaping Use   Vaping Use: Never used  Substance and Sexual Activity   Alcohol use: Yes    Comment: rare   Drug use: No   Sexual activity: Not on file  Other Topics Concern   Not on file  Social History Narrative   Children live in [place] Strain: Low Risk  (10/17/2021)   Overall Financial Resource Strain (CARDIA)    Difficulty of Paying Living Expenses: Not hard at all  Food Insecurity: No Food Insecurity (10/17/2021)   Hunger Vital Sign    Worried About Running Out of Food in the Last Year: Never true    Ran Out of Food in the Last Year: Never true  Transportation Needs: No Transportation Needs (10/17/2021)   PRAPARE - Hydrologist (Medical): No    Lack of Transportation (Non-Medical): No  Physical Activity: Sufficiently Active (10/17/2021)   Exercise Vital Sign    Days of Exercise per Week: 7 days    Minutes of Exercise per Session: 30 min  Stress: No Stress Concern Present (10/17/2021)   [place]    Feeling of Stress : Not at all  Social Connections: [place] (10/17/2021)   Social Connection and Isolation Panel [NHANES]    Frequency of Communication with Friends and Family: More than three times a week    Frequency of Social Gatherings with Friends and Family: More than three times a week    Attends Religious Services: More than 4 times per year    Active Member of [employer] or Organizations: Yes    Attends Music therapist: More than 4 times per year    Marital Status: Married  Human resources officer Violence: Not At Risk (10/17/2021)   Humiliation, Afraid, Rape, and Kick questionnaire    Fear of Current or Ex-Partner: No    Emotionally  Abused: No    Physically Abused: No    Sexually Abused: No    FAMILY HISTORY:  Family History  Problem Relation Age of Onset   Diabetes Father    Hypertension Father    Heart Problems Father    Stroke Father    Arthritis Sister    Arthritis Sister    Diabetes Maternal Grandmother    Arthritis Daughter    Colon cancer Neg Hx     CURRENT MEDICATIONS:  Outpatient Encounter Medications as of 03/08/2022  Medication Sig   abatacept (ORENCIA) 250 MG injection '1000mg'$  (4vials) Intravenous Q 4 weeks   aspirin 81 MG chewable tablet Chew 81 mg by mouth daily.   glimepiride (AMARYL) 2 MG tablet Take 1 tablet (2 mg total) by mouth daily. (Patient taking differently: Take 2 mg by mouth in the morning and  at bedtime.)   hydrochlorothiazide (MICROZIDE) 12.5 MG capsule TAKE (1) CAPSULE DAILY   ketoconazole (NIZORAL) 2 % cream Apply 1 application. topically daily.   losartan (COZAAR) 100 MG tablet TAKE 1 TABLET DAILY   meloxicam (MOBIC) 15 MG tablet TAKE 1 TABELT ONCE DAILY   metFORMIN (GLUCOPHAGE) 1000 MG tablet TAKE 1 TABLET IN THE MORNING AND 1&1/2 TABLETS IN THE EVENING   Multiple Vitamins-Minerals (MULTI FOR HIM) TABS Take 1 tablet by mouth daily.   naloxone (NARCAN) nasal spray 4 mg/0.1 mL Place 1 spray into the nose as needed (opioid overdose).   oxyCODONE-acetaminophen (PERCOCET) 10-325 MG tablet Take 1-2 tablets by mouth every 6 (six) hours as needed for pain. MAXIMUM TOTAL ACETAMINOPHEN DOSE IS 4000 MG PER DAY   simvastatin (ZOCOR) 10 MG tablet TAKE 1 TABLET DAILY   No facility-administered encounter medications on file as of 03/08/2022.    ALLERGIES:  Allergies  Allergen Reactions   Shellfish Allergy Shortness Of Breath and Swelling   Leflunomide     LFT ELEVATION   Methotrexate     LFT elevation   Canagliflozin Other (See Comments)    Causes bladder infections    Cephalexin Other (See Comments)    nervous    Doxycycline Nausea And Vomiting   Dulaglutide Diarrhea and  Nausea Only     PHYSICAL EXAM:  ECOG PERFORMANCE STATUS: 0 - Asymptomatic  There were no vitals filed for this visit. There were no vitals filed for this visit. Physical Exam Constitutional:      Appearance: Normal appearance. He is obese.  HENT:     Head: Normocephalic and atraumatic.     Mouth/Throat:     Mouth: Mucous membranes are moist.  Eyes:     Extraocular Movements: Extraocular movements intact.     Pupils: Pupils are equal, round, and reactive to light.  Cardiovascular:     Rate and Rhythm: Normal rate and regular rhythm.     Pulses: Normal pulses.     Heart sounds: Normal heart sounds.  Pulmonary:     Effort: Pulmonary effort is normal.     Breath sounds: Normal breath sounds.  Abdominal:     General: Bowel sounds are normal.     Palpations: Abdomen is soft.     Tenderness: There is no abdominal tenderness.  Musculoskeletal:        General: No swelling.     Right lower leg: No edema.     Left lower leg: No edema.  Lymphadenopathy:     Cervical: No cervical adenopathy.  Skin:    General: Skin is warm and dry.  Neurological:     General: No focal deficit present.     Mental Status: He is alert and oriented to person, place, and time.  Psychiatric:        Mood and Affect: Mood normal.        Behavior: Behavior normal.      LABORATORY DATA:  I have reviewed the labs as listed.  CBC    Component Value Date/Time   WBC 9.1 03/05/2022 0856   WBC 8.5 09/29/2021 1025   RBC 4.92 03/05/2022 0856   RBC 4.61 09/29/2021 1025   HGB 15.2 03/05/2022 0856   HCT 44.6 03/05/2022 0856   PLT 299 03/05/2022 0856   MCV 91 03/05/2022 0856   MCH 30.9 03/05/2022 0856   MCH 32.1 09/29/2021 1025   MCHC 34.1 03/05/2022 0856   MCHC 34.6 09/29/2021 1025   RDW 11.7 03/05/2022 0856  LYMPHSABS 3.1 03/05/2022 0856   MONOABS 0.4 09/29/2021 1025   EOSABS 0.9 (H) 03/05/2022 0856   BASOSABS 0.1 03/05/2022 0856      Latest Ref Rng & Units 03/05/2022    9:01 AM 08/28/2021     8:32 AM 04/11/2021    9:14 AM  CMP  Glucose 70 - 99 mg/dL 159  189  136   BUN 8 - 27 mg/dL '19  16  23   '$ Creatinine 0.76 - 1.27 mg/dL 1.10  1.11  1.05   Sodium 134 - 144 mmol/L 143  136  137   Potassium 3.5 - 5.2 mmol/L 4.5  4.3  4.3   Chloride 96 - 106 mmol/L 103  100  99   CO2 20 - 29 mmol/L '22  21  28   '$ Calcium 8.6 - 10.2 mg/dL 9.8  9.7  9.1   Total Protein 6.0 - 8.5 g/dL 6.9  6.6    Total Bilirubin 0.0 - 1.2 mg/dL 0.4  0.4    Alkaline Phos 44 - 121 IU/L 69  70    AST 0 - 40 IU/L 28  18    ALT 0 - 44 IU/L 25  22      DIAGNOSTIC IMAGING:  I have independently reviewed the relevant imaging and discussed with the patient.  ASSESSMENT & PLAN: 1.  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: - Last Feraheme infusion on 01/01/2019 and 01/09/2019  - History of positive Cologuard test - Colonoscopy (04/17/2021): Nonbleeding internal hemorrhoids, diverticulosis, polyp x1 - Received Feraheme x2 (with steroid premedication) on 12/26/2020 and 01/02/2021 - He has been taking oral ferrous sulfate daily - No epistaxis, hematochezia, melena - Most recent labs (03/05/2022): Hgb 15.2/MCV 91, ferritin 70, iron saturation 31 % - Suspect malabsorption in the setting of chronic disease and inflammation, possibly in the setting of acid blocking pill for GERD, which he takes 3 times per week - PLAN: Hemoglobin and iron levels improved.  Continue oral iron supplementation. - Repeat labs in 6 months with phone visit the week after.  (Annual office visit, otherwise patient can be seen via telephone visit)  2.  B12 deficiency: - Patient is taking vitamin B12 cyanocobalamin 1000 mcg tablet "when he remembers it," about once every other week - Most recent labs (03/05/2022): Normal vitamin B12 at 677, normal methylmalonic acid - PLAN: Continue daily B12 tablet.  Will recheck B12 and MMA at follow-up in 6 months.  3.  Rheumatoid arthritis: - His arthritis involves small joints of the hands and right shoulder.    - He is on Orencia every 4 weeks.    4.  Tobacco abuse: - He quit smoking in 1992.  Smoked 1.5 pack/day for 20 years. - He is not eligible for LDCT (quit > 15 years ago)   PLAN SUMMARY: >> Labs in 6 months (CBC/D, ferritin, iron/TIBC, B12, MMA) - at [facility]** >> PHONE visit after labs   All questions were answered. The patient knows to call the clinic with any problems, questions or concerns.  Medical decision making: Low  Time spent on visit: I spent 15 minutes counseling the patient face to face. The total time spent in the appointment was 22 minutes and more than 50% was on counseling.   Harriett Rush, PA-C  03/08/2022 9:03 AM

## 2022-03-08 ENCOUNTER — Other Ambulatory Visit: Payer: Self-pay

## 2022-03-08 ENCOUNTER — Inpatient Hospital Stay: Payer: Medicare Other | Attending: Physician Assistant | Admitting: Physician Assistant

## 2022-03-08 VITALS — BP 130/73 | HR 68 | Temp 97.9°F | Resp 17 | Ht 68.0 in | Wt 231.2 lb

## 2022-03-08 DIAGNOSIS — D509 Iron deficiency anemia, unspecified: Secondary | ICD-10-CM | POA: Insufficient documentation

## 2022-03-08 DIAGNOSIS — E538 Deficiency of other specified B group vitamins: Secondary | ICD-10-CM | POA: Diagnosis not present

## 2022-03-08 DIAGNOSIS — D508 Other iron deficiency anemias: Secondary | ICD-10-CM | POA: Diagnosis not present

## 2022-03-08 NOTE — Patient Instructions (Signed)
[facility] at [place] [facility] **   You were seen today by Tarri Abernethy PA-C for your iron deficiency and vitamin B12 deficiency.   Your iron levels look great!  Continue to take iron tablet once every day. Your vitamin B12 levels are normal.  Continue to take vitamin B12, and try to take it at least once a week to maintain adequate levels.   FOLLOW-UP APPOINTMENT: Labs in 6 months with phone visit 1 week after labs  ** Thank you for trusting me with your healthcare!  I strive to provide all of my patients with quality care at each visit.  If you receive a survey for this visit, I would be so grateful to you for taking the time to provide feedback.  Thank you in advance!  ~ Marquell Saenz                   Dr. Derek Jack   &   Tarri Abernethy, PA-C   - - - - - - - - - - - - - - - - - -    Thank you for choosing [place] at [facility] to provide your oncology and hematology care.  To afford each patient quality time with our provider, please arrive at least 15 minutes before your scheduled appointment time.   If you have a lab appointment with the [facility] please come in thru the Main Entrance and check in at the main information desk.  You need to re-schedule your appointment should you arrive 10 or more minutes late.  We strive to give you quality time with our providers, and arriving late affects you and other patients whose appointments are after yours.  Also, if you no show three or more times for appointments you may be dismissed from the clinic at the providers discretion.     Again, thank you for choosing [facility].  Our hope is that these requests will decrease the amount of time that you wait before being seen by our physicians.       _____________________________________________________________  Should you have questions after your visit to [facility],  please contact our office at [phone] and follow the prompts.  Our office hours are 8:00 a.m. and 4:30 p.m. Monday - Friday.  Please note that voicemails left after 4:00 p.m. may not be returned until the following business day.  We are closed weekends and major holidays.  You do have access to a nurse 24-7, just call the main number to the clinic [phone] and do not press any options, hold on the line and a nurse will answer the phone.    For prescription refill requests, have your pharmacy contact our office and allow 72 hours.

## 2022-03-16 ENCOUNTER — Other Ambulatory Visit: Payer: Self-pay | Admitting: Family

## 2022-03-16 DIAGNOSIS — E1159 Type 2 diabetes mellitus with other circulatory complications: Secondary | ICD-10-CM

## 2022-03-20 DIAGNOSIS — G8929 Other chronic pain: Secondary | ICD-10-CM | POA: Diagnosis not present

## 2022-03-20 DIAGNOSIS — G894 Chronic pain syndrome: Secondary | ICD-10-CM | POA: Diagnosis not present

## 2022-03-20 DIAGNOSIS — M4310 Spondylolisthesis, site unspecified: Secondary | ICD-10-CM | POA: Diagnosis not present

## 2022-03-20 DIAGNOSIS — R030 Elevated blood-pressure reading, without diagnosis of hypertension: Secondary | ICD-10-CM | POA: Diagnosis not present

## 2022-03-20 DIAGNOSIS — Z111 Encounter for screening for respiratory tuberculosis: Secondary | ICD-10-CM | POA: Diagnosis not present

## 2022-03-20 DIAGNOSIS — Z79899 Other long term (current) drug therapy: Secondary | ICD-10-CM | POA: Diagnosis not present

## 2022-03-20 DIAGNOSIS — M545 Low back pain, unspecified: Secondary | ICD-10-CM | POA: Diagnosis not present

## 2022-03-20 DIAGNOSIS — Z6833 Body mass index (BMI) 33.0-33.9, adult: Secondary | ICD-10-CM | POA: Diagnosis not present

## 2022-03-20 DIAGNOSIS — M0609 Rheumatoid arthritis without rheumatoid factor, multiple sites: Secondary | ICD-10-CM | POA: Diagnosis not present

## 2022-03-20 DIAGNOSIS — E119 Type 2 diabetes mellitus without complications: Secondary | ICD-10-CM | POA: Diagnosis not present

## 2022-03-20 DIAGNOSIS — R5383 Other fatigue: Secondary | ICD-10-CM | POA: Diagnosis not present

## 2022-04-10 ENCOUNTER — Other Ambulatory Visit: Payer: Self-pay | Admitting: Family

## 2022-04-10 DIAGNOSIS — E1159 Type 2 diabetes mellitus with other circulatory complications: Secondary | ICD-10-CM

## 2022-04-17 ENCOUNTER — Ambulatory Visit: Payer: Medicare Other | Admitting: Family

## 2022-04-17 DIAGNOSIS — G8929 Other chronic pain: Secondary | ICD-10-CM | POA: Diagnosis not present

## 2022-04-17 DIAGNOSIS — M4310 Spondylolisthesis, site unspecified: Secondary | ICD-10-CM | POA: Diagnosis not present

## 2022-04-17 DIAGNOSIS — Z6833 Body mass index (BMI) 33.0-33.9, adult: Secondary | ICD-10-CM | POA: Diagnosis not present

## 2022-04-17 DIAGNOSIS — G894 Chronic pain syndrome: Secondary | ICD-10-CM | POA: Diagnosis not present

## 2022-04-17 DIAGNOSIS — R030 Elevated blood-pressure reading, without diagnosis of hypertension: Secondary | ICD-10-CM | POA: Diagnosis not present

## 2022-04-17 DIAGNOSIS — M0609 Rheumatoid arthritis without rheumatoid factor, multiple sites: Secondary | ICD-10-CM | POA: Diagnosis not present

## 2022-04-17 DIAGNOSIS — E119 Type 2 diabetes mellitus without complications: Secondary | ICD-10-CM | POA: Diagnosis not present

## 2022-04-17 DIAGNOSIS — Z79899 Other long term (current) drug therapy: Secondary | ICD-10-CM | POA: Diagnosis not present

## 2022-04-17 DIAGNOSIS — M545 Low back pain, unspecified: Secondary | ICD-10-CM | POA: Diagnosis not present

## 2022-05-14 DIAGNOSIS — Z6833 Body mass index (BMI) 33.0-33.9, adult: Secondary | ICD-10-CM | POA: Diagnosis not present

## 2022-05-14 DIAGNOSIS — Z79899 Other long term (current) drug therapy: Secondary | ICD-10-CM | POA: Diagnosis not present

## 2022-05-14 DIAGNOSIS — M0609 Rheumatoid arthritis without rheumatoid factor, multiple sites: Secondary | ICD-10-CM | POA: Diagnosis not present

## 2022-05-14 DIAGNOSIS — M2550 Pain in unspecified joint: Secondary | ICD-10-CM | POA: Diagnosis not present

## 2022-05-14 DIAGNOSIS — M1991 Primary osteoarthritis, unspecified site: Secondary | ICD-10-CM | POA: Diagnosis not present

## 2022-05-14 DIAGNOSIS — E669 Obesity, unspecified: Secondary | ICD-10-CM | POA: Diagnosis not present

## 2022-05-15 DIAGNOSIS — R030 Elevated blood-pressure reading, without diagnosis of hypertension: Secondary | ICD-10-CM | POA: Diagnosis not present

## 2022-05-15 DIAGNOSIS — E119 Type 2 diabetes mellitus without complications: Secondary | ICD-10-CM | POA: Diagnosis not present

## 2022-05-15 DIAGNOSIS — Z79899 Other long term (current) drug therapy: Secondary | ICD-10-CM | POA: Diagnosis not present

## 2022-05-15 DIAGNOSIS — Z6833 Body mass index (BMI) 33.0-33.9, adult: Secondary | ICD-10-CM | POA: Diagnosis not present

## 2022-05-15 DIAGNOSIS — M0609 Rheumatoid arthritis without rheumatoid factor, multiple sites: Secondary | ICD-10-CM | POA: Diagnosis not present

## 2022-05-15 DIAGNOSIS — G8929 Other chronic pain: Secondary | ICD-10-CM | POA: Diagnosis not present

## 2022-05-15 DIAGNOSIS — M545 Low back pain, unspecified: Secondary | ICD-10-CM | POA: Diagnosis not present

## 2022-05-15 DIAGNOSIS — M4310 Spondylolisthesis, site unspecified: Secondary | ICD-10-CM | POA: Diagnosis not present

## 2022-05-15 DIAGNOSIS — G894 Chronic pain syndrome: Secondary | ICD-10-CM | POA: Diagnosis not present

## 2022-06-12 DIAGNOSIS — M0609 Rheumatoid arthritis without rheumatoid factor, multiple sites: Secondary | ICD-10-CM | POA: Diagnosis not present

## 2022-06-13 DIAGNOSIS — M545 Low back pain, unspecified: Secondary | ICD-10-CM | POA: Diagnosis not present

## 2022-06-13 DIAGNOSIS — G8929 Other chronic pain: Secondary | ICD-10-CM | POA: Diagnosis not present

## 2022-06-13 DIAGNOSIS — Z79899 Other long term (current) drug therapy: Secondary | ICD-10-CM | POA: Diagnosis not present

## 2022-06-13 DIAGNOSIS — E119 Type 2 diabetes mellitus without complications: Secondary | ICD-10-CM | POA: Diagnosis not present

## 2022-06-13 DIAGNOSIS — Z6833 Body mass index (BMI) 33.0-33.9, adult: Secondary | ICD-10-CM | POA: Diagnosis not present

## 2022-06-13 DIAGNOSIS — R030 Elevated blood-pressure reading, without diagnosis of hypertension: Secondary | ICD-10-CM | POA: Diagnosis not present

## 2022-06-13 DIAGNOSIS — G894 Chronic pain syndrome: Secondary | ICD-10-CM | POA: Diagnosis not present

## 2022-06-13 DIAGNOSIS — M4310 Spondylolisthesis, site unspecified: Secondary | ICD-10-CM | POA: Diagnosis not present

## 2022-07-10 DIAGNOSIS — M0609 Rheumatoid arthritis without rheumatoid factor, multiple sites: Secondary | ICD-10-CM | POA: Diagnosis not present

## 2022-07-12 DIAGNOSIS — Z6834 Body mass index (BMI) 34.0-34.9, adult: Secondary | ICD-10-CM | POA: Diagnosis not present

## 2022-07-12 DIAGNOSIS — M545 Low back pain, unspecified: Secondary | ICD-10-CM | POA: Diagnosis not present

## 2022-07-12 DIAGNOSIS — R030 Elevated blood-pressure reading, without diagnosis of hypertension: Secondary | ICD-10-CM | POA: Diagnosis not present

## 2022-07-12 DIAGNOSIS — E119 Type 2 diabetes mellitus without complications: Secondary | ICD-10-CM | POA: Diagnosis not present

## 2022-07-12 DIAGNOSIS — Z79899 Other long term (current) drug therapy: Secondary | ICD-10-CM | POA: Diagnosis not present

## 2022-07-12 DIAGNOSIS — G894 Chronic pain syndrome: Secondary | ICD-10-CM | POA: Diagnosis not present

## 2022-07-12 DIAGNOSIS — G8929 Other chronic pain: Secondary | ICD-10-CM | POA: Diagnosis not present

## 2022-07-12 DIAGNOSIS — M4310 Spondylolisthesis, site unspecified: Secondary | ICD-10-CM | POA: Diagnosis not present

## 2022-07-30 DIAGNOSIS — E559 Vitamin D deficiency, unspecified: Secondary | ICD-10-CM | POA: Diagnosis not present

## 2022-07-30 DIAGNOSIS — E1165 Type 2 diabetes mellitus with hyperglycemia: Secondary | ICD-10-CM | POA: Diagnosis not present

## 2022-07-30 DIAGNOSIS — D509 Iron deficiency anemia, unspecified: Secondary | ICD-10-CM | POA: Diagnosis not present

## 2022-07-30 DIAGNOSIS — M069 Rheumatoid arthritis, unspecified: Secondary | ICD-10-CM | POA: Diagnosis not present

## 2022-07-30 DIAGNOSIS — I10 Essential (primary) hypertension: Secondary | ICD-10-CM | POA: Diagnosis not present

## 2022-07-30 DIAGNOSIS — E11319 Type 2 diabetes mellitus with unspecified diabetic retinopathy without macular edema: Secondary | ICD-10-CM | POA: Diagnosis not present

## 2022-07-30 DIAGNOSIS — E7800 Pure hypercholesterolemia, unspecified: Secondary | ICD-10-CM | POA: Diagnosis not present

## 2022-07-31 ENCOUNTER — Other Ambulatory Visit: Payer: Self-pay | Admitting: Family

## 2022-07-31 DIAGNOSIS — E1159 Type 2 diabetes mellitus with other circulatory complications: Secondary | ICD-10-CM

## 2022-08-07 DIAGNOSIS — M0609 Rheumatoid arthritis without rheumatoid factor, multiple sites: Secondary | ICD-10-CM | POA: Diagnosis not present

## 2022-08-09 DIAGNOSIS — R030 Elevated blood-pressure reading, without diagnosis of hypertension: Secondary | ICD-10-CM | POA: Diagnosis not present

## 2022-08-09 DIAGNOSIS — Z6833 Body mass index (BMI) 33.0-33.9, adult: Secondary | ICD-10-CM | POA: Diagnosis not present

## 2022-08-09 DIAGNOSIS — M4310 Spondylolisthesis, site unspecified: Secondary | ICD-10-CM | POA: Diagnosis not present

## 2022-08-09 DIAGNOSIS — E119 Type 2 diabetes mellitus without complications: Secondary | ICD-10-CM | POA: Diagnosis not present

## 2022-08-09 DIAGNOSIS — Z79899 Other long term (current) drug therapy: Secondary | ICD-10-CM | POA: Diagnosis not present

## 2022-08-09 DIAGNOSIS — M545 Low back pain, unspecified: Secondary | ICD-10-CM | POA: Diagnosis not present

## 2022-08-09 DIAGNOSIS — G8929 Other chronic pain: Secondary | ICD-10-CM | POA: Diagnosis not present

## 2022-08-10 DIAGNOSIS — M25512 Pain in left shoulder: Secondary | ICD-10-CM | POA: Diagnosis not present

## 2022-09-04 DIAGNOSIS — Z79899 Other long term (current) drug therapy: Secondary | ICD-10-CM | POA: Diagnosis not present

## 2022-09-04 DIAGNOSIS — M0609 Rheumatoid arthritis without rheumatoid factor, multiple sites: Secondary | ICD-10-CM | POA: Diagnosis not present

## 2022-09-07 ENCOUNTER — Ambulatory Visit (INDEPENDENT_AMBULATORY_CARE_PROVIDER_SITE_OTHER): Payer: Medicare Other | Admitting: Family

## 2022-09-07 ENCOUNTER — Encounter: Payer: Self-pay | Admitting: Family

## 2022-09-07 ENCOUNTER — Other Ambulatory Visit: Payer: Self-pay

## 2022-09-07 VITALS — BP 126/71 | HR 62 | Temp 97.7°F | Ht 68.0 in | Wt 230.0 lb

## 2022-09-07 DIAGNOSIS — Z125 Encounter for screening for malignant neoplasm of prostate: Secondary | ICD-10-CM | POA: Diagnosis not present

## 2022-09-07 DIAGNOSIS — E1159 Type 2 diabetes mellitus with other circulatory complications: Secondary | ICD-10-CM | POA: Diagnosis not present

## 2022-09-07 DIAGNOSIS — E785 Hyperlipidemia, unspecified: Secondary | ICD-10-CM | POA: Diagnosis not present

## 2022-09-07 DIAGNOSIS — E1169 Type 2 diabetes mellitus with other specified complication: Secondary | ICD-10-CM

## 2022-09-07 DIAGNOSIS — M1711 Unilateral primary osteoarthritis, right knee: Secondary | ICD-10-CM

## 2022-09-07 DIAGNOSIS — E1165 Type 2 diabetes mellitus with hyperglycemia: Secondary | ICD-10-CM | POA: Diagnosis not present

## 2022-09-07 DIAGNOSIS — D508 Other iron deficiency anemias: Secondary | ICD-10-CM | POA: Diagnosis not present

## 2022-09-07 DIAGNOSIS — L409 Psoriasis, unspecified: Secondary | ICD-10-CM

## 2022-09-07 DIAGNOSIS — E538 Deficiency of other specified B group vitamins: Secondary | ICD-10-CM | POA: Diagnosis not present

## 2022-09-07 DIAGNOSIS — D509 Iron deficiency anemia, unspecified: Secondary | ICD-10-CM | POA: Diagnosis not present

## 2022-09-07 DIAGNOSIS — E669 Obesity, unspecified: Secondary | ICD-10-CM

## 2022-09-07 DIAGNOSIS — Z0000 Encounter for general adult medical examination without abnormal findings: Secondary | ICD-10-CM

## 2022-09-07 DIAGNOSIS — M19011 Primary osteoarthritis, right shoulder: Secondary | ICD-10-CM

## 2022-09-07 DIAGNOSIS — M059 Rheumatoid arthritis with rheumatoid factor, unspecified: Secondary | ICD-10-CM | POA: Diagnosis not present

## 2022-09-07 DIAGNOSIS — I152 Hypertension secondary to endocrine disorders: Secondary | ICD-10-CM | POA: Diagnosis not present

## 2022-09-07 LAB — CBC WITH DIFFERENTIAL/PLATELET
Basos: 1 %
EOS (ABSOLUTE): 0.6 10*3/uL — ABNORMAL HIGH (ref 0.0–0.4)
Eos: 8 %
Hematocrit: 42.8 % (ref 37.5–51.0)
Immature Grans (Abs): 0 10*3/uL (ref 0.0–0.1)
Immature Granulocytes: 0 %
Lymphs: 32 %
MCHC: 32.7 g/dL (ref 31.5–35.7)
MCV: 88 fL (ref 79–97)
RDW: 13.3 % (ref 11.6–15.4)

## 2022-09-07 LAB — IRON AND TIBC

## 2022-09-07 LAB — BAYER DCA HB A1C WAIVED: HB A1C (BAYER DCA - WAIVED): 6.8 % — ABNORMAL HIGH (ref 4.8–5.6)

## 2022-09-07 LAB — FERRITIN

## 2022-09-07 MED ORDER — VALACYCLOVIR HCL 1 G PO TABS: 1000.0000 mg | ORAL_TABLET | Freq: Two times a day (BID) | ORAL | 1 refills | Status: DC

## 2022-09-07 MED ORDER — MAGIC MOUTHWASH W/LIDOCAINE: 10.0000 mL | Freq: Three times a day (TID) | ORAL | 0 refills | Status: DC | PRN

## 2022-09-07 MED ORDER — LOSARTAN POTASSIUM 100 MG PO TABS
100.0000 mg | ORAL_TABLET | Freq: Every day | ORAL | 1 refills | Status: DC
Start: 2022-09-07 — End: 2023-03-13

## 2022-09-07 MED ORDER — METFORMIN HCL 1000 MG PO TABS
ORAL_TABLET | ORAL | 1 refills | Status: DC
Start: 2022-09-07 — End: 2023-03-13

## 2022-09-07 MED ORDER — HYDROCHLOROTHIAZIDE 12.5 MG PO CAPS
ORAL_CAPSULE | ORAL | 0 refills | Status: DC
Start: 2022-09-07 — End: 2022-11-06

## 2022-09-07 MED ORDER — GLIMEPIRIDE 2 MG PO TABS: 2.0000 mg | ORAL_TABLET | Freq: Every day | ORAL | 2 refills | Status: DC

## 2022-09-07 MED ORDER — SIMVASTATIN 10 MG PO TABS: 10.0000 mg | ORAL_TABLET | Freq: Every day | ORAL | 1 refills | Status: DC

## 2022-09-07 MED ORDER — MELOXICAM 15 MG PO TABS: 15.0000 mg | ORAL_TABLET | Freq: Every day | ORAL | 1 refills | Status: DC

## 2022-09-07 NOTE — Progress Notes (Signed)
Subjective:    Patient ID: [name], male    DOB: 1956-02-14, 67 y.o.   MRN: [id number]  Chief Complaint  Patient presents with   Medical Management of Chronic Issues    No concerns. Patient is fasting   PT presnets to the office today for chronic follow up. Pt has rheumatoid arthritis and followed by rheumatologists every 6 months. Pt is followed by Cardiologists annually. He is followed by Hematologists for iron deficiency.     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    He is obese with a BMI of 34 and DM and HTN.  Hypertension This is a chronic problem. The current episode started more than 1 year ago. The problem has been resolved since onset. The problem is controlled. Pertinent negatives include no blurred vision, malaise/fatigue, peripheral edema or shortness of breath. Risk factors for coronary artery disease include dyslipidemia, diabetes mellitus and male gender. The current treatment provides moderate improvement.  Hyperlipidemia This is a chronic problem. The current episode started more than 1 year ago. The problem is controlled. Recent lipid tests were reviewed and are normal. Exacerbating diseases include obesity. Pertinent negatives include no shortness of breath. Current antihyperlipidemic treatment includes statins. The current treatment provides moderate improvement of lipids. Risk factors for coronary artery disease include dyslipidemia, diabetes mellitus, hypertension, male sex and a sedentary lifestyle.  Diabetes He presents for his follow-up diabetic visit. He has type 2 diabetes mellitus. Pertinent negatives for diabetes include no blurred vision and no foot paresthesias. Symptoms are stable. Risk factors for coronary artery disease include dyslipidemia, diabetes mellitus, hypertension and sedentary lifestyle. He is following a generally healthy diet. His overall blood glucose range is 90-110 mg/dl. Eye exam is current.  Arthritis Presents for follow-up  visit. He complains of pain and stiffness. Affected locations include the right knee, left knee, right shoulder, left shoulder, left MCP and right MCP. His pain is at a severity of 6/10.  Anemia Presents for follow-up visit. There has been no malaise/fatigue.      Review of Systems  Constitutional:  Negative for malaise/fatigue.  Eyes:  Negative for blurred vision.  Respiratory:  Negative for shortness of breath.   Musculoskeletal:  Positive for arthritis and stiffness.  All other systems reviewed and are negative.  Family History  Problem Relation Age of Onset   Diabetes Father    Hypertension Father    Heart Problems Father    Stroke Father    Arthritis Sister    Arthritis Sister    Diabetes Maternal Grandmother    Arthritis Daughter    Colon cancer Neg Hx    Social History   Socioeconomic History   Marital status: Married    Spouse name: Rosey Bath   Number of children: 2   Years of education: Not on file   Highest education level: Not on file  Occupational History   Occupation: Disabled  Tobacco Use   Smoking status: Former    Types: Cigarettes    Quit date: 03/05/1991    Years since quitting: 31.5   Smokeless tobacco: Never  Vaping Use   Vaping Use: Never used  Substance and Sexual Activity   Alcohol use: Yes    Comment: rare   Drug use: No   Sexual activity: Not on file  Other Topics Concern   Not on file  Social History Narrative   Children live in [place]   Social Determinants of Health   Financial Resource Strain: Low Risk  (10/17/2021)  Overall Financial Resource Strain (CARDIA)    Difficulty of Paying Living Expenses: Not hard at all  Food Insecurity: No Food Insecurity (10/17/2021)   Hunger Vital Sign    Worried About Running Out of Food in the Last Year: Never true    Ran Out of Food in the Last Year: Never true  Transportation Needs: No Transportation Needs (10/17/2021)   PRAPARE - Administrator, Civil Service (Medical): No    Lack of  Transportation (Non-Medical): No  Physical Activity: Sufficiently Active (10/17/2021)   Exercise Vital Sign    Days of Exercise per Week: 7 days    Minutes of Exercise per Session: 30 min  Stress: No Stress Concern Present (10/17/2021)   [employer] of Occupational Health - Occupational Stress Questionnaire    Feeling of Stress : Not at all  Social Connections: Socially Integrated (10/17/2021)   Social Connection and Isolation Panel [NHANES]    Frequency of Communication with Friends and Family: More than three times a week    Frequency of Social Gatherings with Friends and Family: More than three times a week    Attends Religious Services: More than 4 times per year    Active Member of [employer] or Organizations: Yes    Attends Engineer, structural: More than 4 times per year    Marital Status: Married       Objective:   Physical Exam Vitals reviewed.  Constitutional:      General: He is not in acute distress.    Appearance: He is well-developed. He is obese.  HENT:     Head: Normocephalic.     Right Ear: Tympanic membrane normal.     Left Ear: Tympanic membrane normal.  Eyes:     General:        Right eye: No discharge.        Left eye: No discharge.     Pupils: Pupils are equal, round, and reactive to light.  Neck:     Thyroid: No thyromegaly.  Cardiovascular:     Rate and Rhythm: Normal rate and regular rhythm.     Heart sounds: Normal heart sounds. No murmur heard. Pulmonary:     Effort: Pulmonary effort is normal. No respiratory distress.     Breath sounds: Normal breath sounds. No wheezing.  Abdominal:     General: Bowel sounds are normal. There is no distension.     Palpations: Abdomen is soft.     Tenderness: There is no abdominal tenderness.  Musculoskeletal:        General: No tenderness. Normal range of motion.     Cervical back: Normal range of motion and neck supple.     Comments: Decrease ROM of neck, shoulders  Skin:    General: Skin is warm  and dry.     Findings: No erythema or rash.  Neurological:     Mental Status: He is alert and oriented to person, place, and time.     Cranial Nerves: No cranial nerve deficit.     Deep Tendon Reflexes: Reflexes are normal and symmetric.  Psychiatric:        Behavior: Behavior normal.        Thought Content: Thought content normal.        Judgment: Judgment normal.       BP 126/71   Pulse 62   Temp 97.7 F (36.5 C) (Temporal)   Ht 5\' 8"  (1.727 m)   Wt 230 lb (104.3 kg)  SpO2 95%   BMI 34.97 kg/m      Assessment & Plan:   [name] comes in today with chief complaint of Medical Management of Chronic Issues (No concerns. Patient is fasting)   Diagnosis and orders addressed:  1. Type 2 diabetes mellitus with hyperglycemia, without long-term current use of insulin (HCC) - Bayer DCA Hb A1c Waived - CMP14+EGFR - glimepiride (AMARYL) 2 MG tablet; Take 1 tablet (2 mg total) by mouth daily.  Dispense: 90 tablet; Refill: 2 - metFORMIN (GLUCOPHAGE) 1000 MG tablet; TAKE 1 TABLET IN THE MORNING AND 1&1/2 TABLETS IN THE EVENING  Dispense: 225 tablet; Refill: 1  2. Hypertension associated with diabetes (HCC) - CMP14+EGFR - hydrochlorothiazide (MICROZIDE) 12.5 MG capsule; TAKE (1) CAPSULE DAILY  Dispense: 90 capsule; Refill: 0 - losartan (COZAAR) 100 MG tablet; Take 1 tablet (100 mg total) by mouth daily.  Dispense: 90 tablet; Refill: 1  3. Annual physical exam - Bayer DCA Hb A1c Waived - CMP14+EGFR - Lipid panel - TSH - PSA, total and free  4. Hyperlipidemia associated with type 2 diabetes mellitus (HCC) - CMP14+EGFR  5. Iron deficiency anemia, unspecified iron deficiency anemia type - CMP14+EGFR  6. Obesity (BMI 30-39.9) - CMP14+EGFR  7. Osteoarthritis of right shoulder, unspecified osteoarthritis type  - CMP14+EGFR  8. Primary localized osteoarthritis of right knee - CMP14+EGFR  9. Rheumatoid arthritis with positive rheumatoid factor, involving unspecified  site (HCC) - CMP14+EGFR  10. Psoriasis  - CMP14+EGFR   Labs pending Health Maintenance reviewed Diet and exercise encouraged  Follow up plan: 6 months   Jannifer Rodney, FNP

## 2022-09-07 NOTE — Patient Instructions (Signed)
Health Maintenance After Age 69 After age 69, you are at a higher risk for certain long-term diseases and infections as well as injuries from falls. Falls are a major cause of broken bones and head injuries in people who are older than age 69. Getting regular preventive care can help to keep you healthy and well. Preventive care includes getting regular testing and making lifestyle changes as recommended by your health care provider. Talk with your health care provider about: Which screenings and tests you should have. A screening is a test that checks for a disease when you have no symptoms. A diet and exercise plan that is right for you. What should I know about screenings and tests to prevent falls? Screening and testing are the best ways to find a health problem early. Early diagnosis and treatment give you the best chance of managing medical conditions that are common after age 69. Certain conditions and lifestyle choices may make you more likely to have a fall. Your health care provider may recommend: Regular vision checks. Poor vision and conditions such as cataracts can make you more likely to have a fall. If you wear glasses, make sure to get your prescription updated if your vision changes. Medicine review. Work with your health care provider to regularly review all of the medicines you are taking, including over-the-counter medicines. Ask your health care provider about any side effects that may make you more likely to have a fall. Tell your health care provider if any medicines that you take make you feel dizzy or sleepy. Strength and balance checks. Your health care provider may recommend certain tests to check your strength and balance while standing, walking, or changing positions. Foot health exam. Foot pain and numbness, as well as not wearing proper footwear, can make you more likely to have a fall. Screenings, including: Osteoporosis screening. Osteoporosis is a condition that causes  the bones to get weaker and break more easily. Blood pressure screening. Blood pressure changes and medicines to control blood pressure can make you feel dizzy. Depression screening. You may be more likely to have a fall if you have a fear of falling, feel depressed, or feel unable to do activities that you used to do. Alcohol use screening. Using too much alcohol can affect your balance and may make you more likely to have a fall. Follow these instructions at home: Lifestyle Do not drink alcohol if: Your health care provider tells you not to drink. If you drink alcohol: Limit how much you have to: 0-1 drink a day for women. 0-2 drinks a day for men. Know how much alcohol is in your drink. In the [place] one drink equals one 12 oz bottle of beer (355 mL), one 5 oz glass of wine (148 mL), or one 1 oz glass of hard liquor (44 mL). Do not use any products that contain nicotine or tobacco. These products include cigarettes, chewing tobacco, and vaping devices, such as e-cigarettes. If you need help quitting, ask your health care provider. Activity  Follow a regular exercise program to stay fit. This will help you maintain your balance. Ask your health care provider what types of exercise are appropriate for you. If you need a cane or walker, use it as recommended by your health care provider. Wear supportive shoes that have nonskid soles. Safety  Remove any tripping hazards, such as rugs, cords, and clutter. Install safety equipment such as grab bars in bathrooms and safety rails on stairs. Keep rooms and walkways   well-lit. General instructions Talk with your health care provider about your risks for falling. Tell your health care provider if: You fall. Be sure to tell your health care provider about all falls, even ones that seem minor. You feel dizzy, tiredness (fatigue), or off-balance. Take over-the-counter and prescription medicines only as told by your health care provider. These include  supplements. Eat a healthy diet and maintain a healthy weight. A healthy diet includes low-fat dairy products, low-fat (lean) meats, and fiber from whole grains, beans, and lots of fruits and vegetables. Stay current with your vaccines. Schedule regular health, dental, and eye exams. Summary Having a healthy lifestyle and getting preventive care can help to protect your health and wellness after age 69. Screening and testing are the best way to find a health problem early and help you avoid having a fall. Early diagnosis and treatment give you the best chance for managing medical conditions that are more common for people who are older than age 69. Falls are a major cause of broken bones and head injuries in people who are older than age 69. Take precautions to prevent a fall at home. Work with your health care provider to learn what changes you can make to improve your health and wellness and to prevent falls. This information is not intended to replace advice given to you by your health care provider. Make sure you discuss any questions you have with your health care provider. Document Revised: 08/15/2020 Document Reviewed: 08/15/2020 Elsevier Patient Education  2024 [employer]  

## 2022-09-08 LAB — CMP14+EGFR
ALT: 24 IU/L (ref 0–44)
AST: 20 IU/L (ref 0–40)
Albumin/Globulin Ratio: 2.3 — ABNORMAL HIGH (ref 1.2–2.2)
Albumin: 4.5 g/dL (ref 3.9–4.9)
Alkaline Phosphatase: 67 IU/L (ref 44–121)
BUN/Creatinine Ratio: 13 (ref 10–24)
BUN: 15 mg/dL (ref 8–27)
Bilirubin Total: 0.2 mg/dL (ref 0.0–1.2)
CO2: 27 mmol/L (ref 20–29)
Calcium: 9.2 mg/dL (ref 8.6–10.2)
Chloride: 101 mmol/L (ref 96–106)
Creatinine, Ser: 1.14 mg/dL (ref 0.76–1.27)
Globulin, Total: 2 g/dL (ref 1.5–4.5)
Glucose: 102 mg/dL — ABNORMAL HIGH (ref 70–99)
Potassium: 4.7 mmol/L (ref 3.5–5.2)
Sodium: 141 mmol/L (ref 134–144)
Total Protein: 6.5 g/dL (ref 6.0–8.5)
eGFR: 70 mL/min/{1.73_m2} (ref >59)

## 2022-09-08 LAB — LIPID PANEL
Chol/HDL Ratio: 2.5 ratio (ref 0.0–5.0)
Cholesterol, Total: 108 mg/dL (ref 100–199)
HDL: 43 mg/dL (ref >39)
LDL Chol Calc (NIH): 47 mg/dL (ref 0–99)
Triglycerides: 96 mg/dL (ref 0–149)
VLDL Cholesterol Cal: 18 mg/dL (ref 5–40)

## 2022-09-08 LAB — CBC WITH DIFFERENTIAL/PLATELET
Basophils Absolute: 0.1 10*3/uL (ref 0.0–0.2)
Monocytes: 6 %
Platelets: 254 10*3/uL (ref 150–450)

## 2022-09-08 LAB — IRON AND TIBC
Iron Saturation: 36 % (ref 15–55)
Iron: 120 ug/dL (ref 38–169)

## 2022-09-08 LAB — TSH: TSH: 2.29 u[IU]/mL (ref 0.450–4.500)

## 2022-09-08 LAB — PSA, TOTAL AND FREE
PSA, Free Pct: 32 %
PSA, Free: 0.16 ng/mL
Prostate Specific Ag, Serum: 0.5 ng/mL (ref 0.0–4.0)

## 2022-09-10 DIAGNOSIS — M19011 Primary osteoarthritis, right shoulder: Secondary | ICD-10-CM | POA: Diagnosis not present

## 2022-09-10 LAB — CBC WITH DIFFERENTIAL/PLATELET
Hemoglobin: 14 g/dL (ref 13.0–17.7)
Lymphocytes Absolute: 2.3 10*3/uL (ref 0.7–3.1)
MCH: 28.9 pg (ref 26.6–33.0)
Monocytes Absolute: 0.5 10*3/uL (ref 0.1–0.9)
Neutrophils Absolute: 3.8 10*3/uL (ref 1.4–7.0)
Neutrophils: 53 %
RBC: 4.84 x10E6/uL (ref 4.14–5.80)
WBC: 7.2 10*3/uL (ref 3.4–10.8)

## 2022-09-10 LAB — METHYLMALONIC ACID, SERUM: Methylmalonic Acid: 121 nmol/L (ref 0–378)

## 2022-09-10 LAB — VITAMIN B12: Vitamin B-12: 728 pg/mL (ref 232–1245)

## 2022-09-13 NOTE — Progress Notes (Signed)
VIRTUAL VISIT via TELEPHONE NOTE [facility]   I connected with [name]  on 09/14/2022 at 11:25 AM by telephone and verified that I am speaking with the correct person using two identifiers.  Location: Patient: Home Provider: [facility]   I discussed the limitations, risks, security and privacy concerns of performing an evaluation and management service by telephone and the availability of in person appointments. I also discussed with the patient that there may be a patient responsible charge related to this service. The patient expressed understanding and agreed to proceed.  REASON FOR VISIT:  Follow-up for iron deficiency and B12 deficiency   PRIOR THERAPY: Intermittent IV iron   CURRENT THERAPY: Oral iron tablets and B12 tablets  INTERVAL HISTORY:  [name] is contacted today for follow-up of iron deficiency anemia.  He was last seen by Rojelio Brenner PA-C on 03/08/2022.  At today's visit, he reports feeling well.  No recent hospitalizations, surgeries, or changes in baseline health status.   He denies any fatigue and reports that he has good energy.  He is taking iron tablet daily.  He takes B12 tablet "when he remembers it," about once every other week.  He denies any bleeding such as epistaxis, hematochezia, or melena.  He has some chronic intermittent restless leg symptoms.  He denies any pica, headaches, chest pain, dyspnea on exertion, lightheadedness, or syncope.  He continues to take Orencia for his rheumatoid arthritis.    He reports 100% energy and 100% appetite.  He reports that he is maintaining a stable weight.    REVIEW OF SYSTEMS: No acute complaints at today's visit  Review of Systems  Constitutional:  Negative for chills, diaphoresis, fever, malaise/fatigue and weight loss.  Respiratory:  Negative for cough and shortness of breath.   Cardiovascular:  Negative for chest pain and palpitations.  Gastrointestinal:   Negative for abdominal pain, blood in stool, melena, nausea and vomiting.  Neurological:  Negative for dizziness and headaches.     PHYSICAL EXAM: (per limitations of virtual telephone visit)  The patient is alert and oriented x 3, exhibiting adequate mentation, good mood, and ability to speak in full sentences and execute sound judgement.  ASSESSMENT & PLAN:  1.  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  Since he is asymptomatic, we will hold off on any IV iron at this time.  Continue oral iron supplementation. - Repeat labs in 6 months with OFFICE visit the week after.  (Annual office visit, otherwise patient can be seen via telephone visit)  2.  B12 deficiency: - Patient is taking vitamin B12 cyanocobalamin 1000 mcg tablet "when he remembers it," about once every other week - Most recent labs (09/07/2022): Normal vitamin B12 at 728, normal methylmalonic acid - PLAN: Continue daily B12 tablet.  Will recheck B12 and MMA in 1 year.   3.  Rheumatoid  arthritis: - His arthritis involves small joints of the hands and right shoulder.   - He is on Orencia every 4 weeks.    4.  Tobacco abuse: - He quit smoking in 1992.  Smoked 1.5 pack/day for 20 years. - He is not eligible for LDCT (quit > 15 years ago)  PLAN SUMMARY: >> Labs in 6 months  (CBC/D, ferritin, iron/TIBC) - at [facility]** >> OFFICE visit after labs     I discussed the assessment and treatment plan with the patient. The patient was provided an opportunity to ask questions and all were answered. The patient agreed with the plan and demonstrated an understanding of the instructions.   The patient was advised to call back or seek an in-person evaluation if the symptoms worsen or if the condition fails to improve as anticipated.  I provided 18 minutes of non-face-to-face time during this encounter.  Carnella Guadalajara, PA-C 09/14/2022 11:40 AM

## 2022-09-14 ENCOUNTER — Inpatient Hospital Stay: Payer: Medicare Other | Attending: Physician Assistant | Admitting: Physician Assistant

## 2022-09-14 DIAGNOSIS — E119 Type 2 diabetes mellitus without complications: Secondary | ICD-10-CM | POA: Diagnosis not present

## 2022-09-14 DIAGNOSIS — D508 Other iron deficiency anemias: Secondary | ICD-10-CM | POA: Diagnosis not present

## 2022-09-14 DIAGNOSIS — G8929 Other chronic pain: Secondary | ICD-10-CM | POA: Diagnosis not present

## 2022-09-14 DIAGNOSIS — R030 Elevated blood-pressure reading, without diagnosis of hypertension: Secondary | ICD-10-CM | POA: Diagnosis not present

## 2022-09-14 DIAGNOSIS — M4310 Spondylolisthesis, site unspecified: Secondary | ICD-10-CM | POA: Diagnosis not present

## 2022-09-14 DIAGNOSIS — Z6832 Body mass index (BMI) 32.0-32.9, adult: Secondary | ICD-10-CM | POA: Diagnosis not present

## 2022-09-14 DIAGNOSIS — E538 Deficiency of other specified B group vitamins: Secondary | ICD-10-CM | POA: Diagnosis not present

## 2022-09-14 DIAGNOSIS — Z79899 Other long term (current) drug therapy: Secondary | ICD-10-CM | POA: Diagnosis not present

## 2022-09-14 DIAGNOSIS — M545 Low back pain, unspecified: Secondary | ICD-10-CM | POA: Diagnosis not present

## 2022-09-17 ENCOUNTER — Other Ambulatory Visit: Payer: Self-pay | Admitting: Family

## 2022-09-17 DIAGNOSIS — Y999 Unspecified external cause status: Secondary | ICD-10-CM | POA: Diagnosis not present

## 2022-09-17 DIAGNOSIS — I10 Essential (primary) hypertension: Secondary | ICD-10-CM | POA: Diagnosis not present

## 2022-09-17 DIAGNOSIS — S46911A Strain of unspecified muscle, fascia and tendon at shoulder and upper arm level, right arm, initial encounter: Secondary | ICD-10-CM | POA: Diagnosis not present

## 2022-09-17 DIAGNOSIS — M546 Pain in thoracic spine: Secondary | ICD-10-CM | POA: Diagnosis not present

## 2022-09-17 DIAGNOSIS — E1159 Type 2 diabetes mellitus with other circulatory complications: Secondary | ICD-10-CM

## 2022-09-17 DIAGNOSIS — Y929 Unspecified place or not applicable: Secondary | ICD-10-CM | POA: Diagnosis not present

## 2022-09-17 DIAGNOSIS — Y939 Activity, unspecified: Secondary | ICD-10-CM | POA: Diagnosis not present

## 2022-09-17 DIAGNOSIS — X58XXXA Exposure to other specified factors, initial encounter: Secondary | ICD-10-CM | POA: Diagnosis not present

## 2022-09-17 DIAGNOSIS — E785 Hyperlipidemia, unspecified: Secondary | ICD-10-CM | POA: Diagnosis not present

## 2022-09-17 DIAGNOSIS — E119 Type 2 diabetes mellitus without complications: Secondary | ICD-10-CM | POA: Diagnosis not present

## 2022-10-02 DIAGNOSIS — M0609 Rheumatoid arthritis without rheumatoid factor, multiple sites: Secondary | ICD-10-CM | POA: Diagnosis not present

## 2022-10-08 DIAGNOSIS — M4310 Spondylolisthesis, site unspecified: Secondary | ICD-10-CM | POA: Diagnosis not present

## 2022-10-08 DIAGNOSIS — M545 Low back pain, unspecified: Secondary | ICD-10-CM | POA: Diagnosis not present

## 2022-10-08 DIAGNOSIS — R030 Elevated blood-pressure reading, without diagnosis of hypertension: Secondary | ICD-10-CM | POA: Diagnosis not present

## 2022-10-08 DIAGNOSIS — E119 Type 2 diabetes mellitus without complications: Secondary | ICD-10-CM | POA: Diagnosis not present

## 2022-10-08 DIAGNOSIS — Z79899 Other long term (current) drug therapy: Secondary | ICD-10-CM | POA: Diagnosis not present

## 2022-10-08 DIAGNOSIS — Z6833 Body mass index (BMI) 33.0-33.9, adult: Secondary | ICD-10-CM | POA: Diagnosis not present

## 2022-10-08 DIAGNOSIS — G8929 Other chronic pain: Secondary | ICD-10-CM | POA: Diagnosis not present

## 2022-10-30 DIAGNOSIS — M0609 Rheumatoid arthritis without rheumatoid factor, multiple sites: Secondary | ICD-10-CM | POA: Diagnosis not present

## 2022-10-31 ENCOUNTER — Ambulatory Visit (INDEPENDENT_AMBULATORY_CARE_PROVIDER_SITE_OTHER): Payer: Medicare Other

## 2022-10-31 VITALS — Ht 70.0 in | Wt 224.0 lb

## 2022-10-31 DIAGNOSIS — Z0000 Encounter for general adult medical examination without abnormal findings: Secondary | ICD-10-CM

## 2022-10-31 NOTE — Progress Notes (Signed)
Subjective:   [name] is a 67 y.o. male who presents for Medicare Annual/Subsequent preventive examination.  Visit Complete: Virtual  I connected with  [name] on 10/31/22 by a audio enabled telemedicine application and verified that I am speaking with the correct person using two identifiers.  Patient Location: Home  Provider Location: Home Office  I discussed the limitations of evaluation and management by telemedicine. The patient expressed understanding and agreed to proceed.  Patient Medicare AWV questionnaire was completed by the patient on 10/31/2022; I have confirmed that all information answered by patient is correct and no changes since this date.  Review of Systems    Nutrition Risk Assessment:  Has the patient had any N/V/D within the last 2 months?  No  Does the patient have any non-healing wounds?  No  Has the patient had any unintentional weight loss or weight gain?  No   Diabetes:  Is the patient diabetic?  Yes  If diabetic, was a CBG obtained today?  No  Did the patient bring in their glucometer from home?  No  How often do you monitor your CBG's? Weekly .   Financial Strains and Diabetes Management:  Are you having any financial strains with the device, your supplies or your medication? No .  Does the patient want to be seen by Chronic Care Management for management of their diabetes?  No  Would the patient like to be referred to a Nutritionist or for Diabetic Management?  No   Diabetic Exams:  Diabetic Eye Exam: Completed 11/2021 Diabetic Foot Exam: Overdue, Pt has been advised about the importance in completing this exam. Pt is scheduled for diabetic foot exam on next office visit .  Cardiac Risk Factors include: advanced age (>22men, >33 women);diabetes mellitus;male gender;dyslipidemia;hypertension     Objective:    Today's Vitals   10/31/22 0909  Weight: 224 lb (101.6 kg)  Height: 5\' 10"  (1.778 m)   Body mass index is 32.14  kg/m.     10/31/2022    9:13 AM 09/14/2022   11:13 AM 03/08/2022    8:10 AM 10/17/2021   10:39 AM 06/30/2021   11:23 AM 04/17/2021    7:03 AM 01/02/2021    8:36 AM  Advanced Directives  Does Patient Have a Medical Advance Directive? No No No No No No No  Would patient like information on creating a medical advance directive? Yes (MAU/Ambulatory/Procedural Areas - Information given) No - Patient declined No - Patient declined No - Patient declined No - Patient declined  No - Patient declined    Current Medications (verified) Outpatient Encounter Medications as of 10/31/2022  Medication Sig   abatacept (ORENCIA) 250 MG injection 1000mg  (4vials) Intravenous Q 4 weeks   aspirin 81 MG chewable tablet Chew 81 mg by mouth daily.   glimepiride (AMARYL) 2 MG tablet Take 1 tablet (2 mg total) by mouth daily.   hydrochlorothiazide (MICROZIDE) 12.5 MG capsule TAKE (1) CAPSULE DAILY   ketoconazole (NIZORAL) 2 % cream Apply 1 application. topically daily.   losartan (COZAAR) 100 MG tablet Take 1 tablet (100 mg total) by mouth daily.   magic mouthwash w/lidocaine SOLN Take 10 mLs by mouth 3 (three) times daily as needed for mouth pain.   meloxicam (MOBIC) 15 MG tablet Take 1 tablet (15 mg total) by mouth daily.   metFORMIN (GLUCOPHAGE) 1000 MG tablet TAKE 1 TABLET IN THE MORNING AND 1&1/2 TABLETS IN THE EVENING   Multiple Vitamins-Minerals (MULTI FOR HIM)  TABS Take 1 tablet by mouth daily.   naloxone (NARCAN) nasal spray 4 mg/0.1 mL Place 1 spray into the nose as needed (opioid overdose).   oxyCODONE-acetaminophen (PERCOCET) 10-325 MG tablet Take 1-2 tablets by mouth every 6 (six) hours as needed for pain. MAXIMUM TOTAL ACETAMINOPHEN DOSE IS 4000 MG PER DAY   simvastatin (ZOCOR) 10 MG tablet Take 1 tablet (10 mg total) by mouth daily.   valACYclovir (VALTREX) 1000 MG tablet Take 1 tablet (1,000 mg total) by mouth 2 (two) times daily.   No facility-administered encounter medications on file as of  10/31/2022.    Allergies (verified) Shellfish allergy, Leflunomide, Methotrexate, Canagliflozin, Cephalexin, Doxycycline, and Dulaglutide   History: Past Medical History:  Diagnosis Date   Anemia    taking iron   Arthritis    rheumatoid arthritis   Diabetes mellitus    Heart murmur    Hyperlipidemia    Hypertension    Primary localized osteoarthritis of right knee 05/13/2015   Scapho-lunate dissociation, right    Vitamin D deficiency    Past Surgical History:  Procedure Laterality Date   CARDIAC CATHETERIZATION  2005   normal   CARPAL TUNNEL RELEASE  2009   both hands   COLONOSCOPY WITH PROPOFOL N/A 04/17/2021   Procedure: COLONOSCOPY WITH PROPOFOL;  Surgeon: Lanelle Bal, DO;  Location: [facility];  Service: Endoscopy;  Laterality: N/A;  8:15am, ASA 2   HARDWARE REMOVAL Right 11/14/2017   Procedure: REMOVAL ACCUTRAC SCREW RIGHT WRIST;  Surgeon: Cindee Salt, MD;  Location: [facility];  Service: Orthopedics;  Laterality: Right;   KNEE ARTHROSCOPY     rt   PARTIAL KNEE ARTHROPLASTY Right 05/13/2015   Procedure: RIGHT UNI KNEE ARTHROPLASTY;  Surgeon: Teryl Lucy, MD;  Location: [facility];  Service: Orthopedics;  Laterality: Right;   POLYPECTOMY  04/17/2021   Procedure: POLYPECTOMY;  Surgeon: Lanelle Bal, DO;  Location: [facility];  Service: Endoscopy;;   scaphoid excision Left 2012   Dr Teressa Senter   TONSILLECTOMY     as a child   TOTAL SHOULDER ARTHROPLASTY Right 08/23/2020   Procedure: TOTAL SHOULDER ARTHROPLASTY;  Surgeon: Teryl Lucy, MD;  Location: [facility];  Service: Orthopedics;  Laterality: Right;   WRIST FUSION Right 03/14/2017   Procedure: SCAPHOID EXCISION ULNAR FOUR BONE FUSION RIGHT;  Surgeon: Cindee Salt, MD;  Location: [facility];  Service: Orthopedics;  Laterality: Right;   Family History  Problem Relation Age of Onset   Diabetes Father    Hypertension Father    Heart Problems Father    Stroke Father     Arthritis Sister    Arthritis Sister    Diabetes Maternal Grandmother    Arthritis Daughter    Colon cancer Neg Hx    Social History   Socioeconomic History   Marital status: Married    Spouse name: [name]   Number of children: 2   Years of education: Not on file   Highest education level: Not on file  Occupational History   Occupation: Disabled  Tobacco Use   Smoking status: Former    Current packs/day: 0.00    Types: Cigarettes    Quit date: 03/05/1991    Years since quitting: 31.6   Smokeless tobacco: Never  Vaping Use   Vaping status: Never Used  Substance and Sexual Activity   Alcohol use: Yes    Comment: rare   Drug use: No   Sexual activity: Not on file  Other Topics Concern   Not on file  Social History Narrative   Children live in [place]   Social Determinants of Health   Financial Resource Strain: Low Risk  (10/31/2022)   Overall Financial Resource Strain (CARDIA)    Difficulty of Paying Living Expenses: Not hard at all  Food Insecurity: No Food Insecurity (10/31/2022)   Hunger Vital Sign    Worried About Running Out of Food in the Last Year: Never true    Ran Out of Food in the Last Year: Never true  Transportation Needs: No Transportation Needs (10/31/2022)   PRAPARE - Administrator, Civil Service (Medical): No    Lack of Transportation (Non-Medical): No  Physical Activity: Inactive (10/31/2022)   Exercise Vital Sign    Days of Exercise per Week: 0 days    Minutes of Exercise per Session: 0 min  Stress: No Stress Concern Present (10/31/2022)   [employer] of Occupational Health - Occupational Stress Questionnaire    Feeling of Stress : Not at all  Social Connections: Socially Integrated (10/31/2022)   Social Connection and Isolation Panel [NHANES]    Frequency of Communication with Friends and Family: More than three times a week    Frequency of Social Gatherings with Friends and Family: More than three times a week    Attends Religious  Services: More than 4 times per year    Active Member of [employer] or Organizations: Yes    Attends Engineer, structural: More than 4 times per year    Marital Status: Married    Tobacco Counseling Counseling given: Not Answered   Clinical Intake:  Pre-visit preparation completed: Yes  Pain : No/denies pain     Nutritional Risks: None Diabetes: Yes CBG done?: No Did pt. bring in CBG monitor from home?: No  How often do you need to have someone help you when you read instructions, pamphlets, or other written materials from your doctor or pharmacy?: 1 - Never  Interpreter Needed?: No  Information entered by :: Renie Ora, LPN   Activities of Daily Living    10/31/2022    9:13 AM  In your present state of health, do you have any difficulty performing the following activities:  Hearing? 0  Vision? 0  Difficulty concentrating or making decisions? 0  Walking or climbing stairs? 0  Dressing or bathing? 0  Doing errands, shopping? 0  Preparing Food and eating ? N  Using the Toilet? N  In the past six months, have you accidently leaked urine? N  Do you have problems with loss of bowel control? N  Managing your Medications? N  Managing your Finances? N  Housekeeping or managing your Housekeeping? N    Patient Care Team: Junie Spencer, FNP as PCP - General (Nurse Practitioner) Wendall Stade, MD as PCP - Cardiology (Cardiology) Zenovia Jordan, MD as Consulting Physician (Rheumatology) Wendall Stade, MD as Consulting Physician (Cardiology) Dorisann Frames, MD as Referring Physician (Endocrinology) Doreatha Massed, MD as Consulting Physician (Hematology) Teryl Lucy, MD as Consulting Physician (Orthopedic Surgery) Merryl Hacker, NP as Nurse Practitioner (Nurse Practitioner)  Indicate any recent Medical Services you may have received from other than [facility] providers in the past year (date may be approximate).     Assessment:   This is a routine  wellness examination for Daymion.  Hearing/Vision screen Vision Screening - Comments:: Wears rx glasses - up to date with routine eye exams with  [facility]   Dietary  issues and exercise activities discussed:     Goals Addressed             This Visit's Progress    DIET - INCREASE WATER INTAKE         Depression Screen    10/31/2022    9:12 AM 03/05/2022    8:04 AM 12/28/2021    3:55 PM 10/17/2021   10:38 AM 10/14/2020    2:32 PM 05/31/2020    8:07 AM 05/04/2020   11:32 AM  PHQ 2/9 Scores  PHQ - 2 Score 0 0 0 0 0 0 0  PHQ- 9 Score  0         Fall Risk    10/31/2022    9:10 AM 03/05/2022    8:04 AM 12/28/2021    3:55 PM 10/17/2021   10:34 AM 10/14/2020    2:40 PM  Fall Risk   Falls in the past year? 0 0 0 0 0  Number falls in past yr: 0   0 0  Injury with Fall? 0   0 0  Risk for fall due to : No Fall Risks   Orthopedic patient Orthopedic patient;Impaired vision  Follow up Falls prevention discussed   Falls prevention discussed Falls prevention discussed    MEDICARE RISK AT HOME:  Medicare Risk at Home - 10/31/22 0910     Any stairs in or around the home? Yes    If so, are there any without handrails? No    Home free of loose throw rugs in walkways, pet beds, electrical cords, etc? Yes    Adequate lighting in your home to reduce risk of falls? Yes    Life alert? No    Use of a cane, walker or w/c? No    Grab bars in the bathroom? Yes    Shower chair or bench in shower? Yes    Elevated toilet seat or a handicapped toilet? Yes             TIMED UP AND GO:  Was the test performed?  No    Cognitive Function:        10/31/2022    9:13 AM 10/17/2021   10:40 AM 10/14/2020    2:41 PM  6CIT Screen  What Year? 0 points 0 points 0 points  What month? 0 points 0 points 0 points  What time? 0 points 0 points 0 points  Count back from 20 0 points 0 points 0 points  Months in reverse 0 points 0 points 0 points  Repeat phrase 0 points 0 points 0 points   Total Score 0 points 0 points 0 points    Immunizations Immunization History  Administered Date(s) Administered   Janssen (J&J) SARS-COV-2 Vaccination 08/25/2019   Pneumococcal Polysaccharide-23 09/23/2010   Tdap 11/23/2010    TDAP status: Due, Education has been provided regarding the importance of this vaccine. Advised may receive this vaccine at local pharmacy or Health Dept. Aware to provide a copy of the vaccination record if obtained from local pharmacy or Health Dept. Verbalized acceptance and understanding.  Flu Vaccine status: Declined, Education has been provided regarding the importance of this vaccine but patient still declined. Advised may receive this vaccine at local pharmacy or Health Dept. Aware to provide a copy of the vaccination record if obtained from local pharmacy or Health Dept. Verbalized acceptance and understanding.  Pneumococcal vaccine status: Up to date  Covid-19 vaccine status: Completed vaccines  Qualifies for Shingles Vaccine? Yes  Zostavax completed No   Shingrix Completed?: No.    Education has been provided regarding the importance of this vaccine. Patient has been advised to call insurance company to determine out of pocket expense if they have not yet received this vaccine. Advised may also receive vaccine at local pharmacy or Health Dept. Verbalized acceptance and understanding.  Screening Tests Health Maintenance  Topic Date Due   COVID-19 Vaccine (2 - Janssen risk series) 09/22/2019   FOOT EXAM  08/29/2022   OPHTHALMOLOGY EXAM  09/19/2022   Zoster Vaccines- Shingrix (1 of 2) 12/08/2022 (Originally 07/10/1974)   Pneumonia Vaccine 46+ Years old (2 of 2 - PCV) 03/06/2023 (Originally 07/11/2018)   INFLUENZA VACCINE  11/08/2022   Diabetic kidney evaluation - Urine ACR  03/06/2023   HEMOGLOBIN A1C  03/09/2023   Diabetic kidney evaluation - eGFR measurement  09/07/2023   Medicare Annual Wellness (AWV)  10/31/2023   Colonoscopy  04/17/2026    Hepatitis C Screening  Completed   HPV VACCINES  Aged Out   DTaP/Tdap/Td  Discontinued   Fecal DNA (Cologuard)  Discontinued    Health Maintenance  Health Maintenance Due  Topic Date Due   COVID-19 Vaccine (2 - Janssen risk series) 09/22/2019   FOOT EXAM  08/29/2022   OPHTHALMOLOGY EXAM  09/19/2022    Colorectal cancer screening: Type of screening: Colonoscopy. Completed 04/17/2021. Repeat every 5 years  Lung Cancer Screening: (Low Dose CT Chest recommended if Age 59-80 years, 20 pack-year currently smoking OR have quit w/in 15years.) does not qualify.   Lung Cancer Screening Referral: n/a  Additional Screening:  Hepatitis C Screening: does not qualify; Completed 12/09/2014  Vision Screening: Recommended annual ophthalmology exams for early detection of glaucoma and other disorders of the eye. Is the patient up to date with their annual eye exam?  Yes  Who is the provider or what is the name of the office in which the patient attends annual eye exams? [facility]  If pt is not established with a provider, would they like to be referred to a provider to establish care? No .   Dental Screening: Recommended annual dental exams for proper oral hygiene    Community Resource Referral / Chronic Care Management: CRR required this visit?  No   CCM required this visit?  No     Plan:     I have personally reviewed and noted the following in the patient's chart:   Medical and social history Use of alcohol, tobacco or illicit drugs  Current medications and supplements including opioid prescriptions. Patient is currently taking opioid prescriptions. Information provided to patient regarding non-opioid alternatives. Patient advised to discuss non-opioid treatment plan with their provider. Functional ability and status Nutritional status Physical activity Advanced directives List of other physicians Hospitalizations, surgeries, and ER visits in previous 12  months Vitals Screenings to include cognitive, depression, and falls Referrals and appointments  In addition, I have reviewed and discussed with patient certain preventive protocols, quality metrics, and best practice recommendations. A written personalized care plan for preventive services as well as general preventive health recommendations were provided to patient.     Lorrene Reid, LPN   1/61/0960   After Visit Summary: (MyChart) Due to this being a telephonic visit, the after visit summary with patients personalized plan was offered to patient via MyChart   Nurse Notes: Due TDAP Vaccine

## 2022-10-31 NOTE — Patient Instructions (Signed)
[name] , Thank you for taking time to come for your Medicare Wellness Visit. I appreciate your ongoing commitment to your health goals. Please review the following plan we discussed and let me know if I can assist you in the future.   These are the goals we discussed:  Goals      DIET - INCREASE WATER INTAKE     Exercise 3x per week (30 min per time)        This is a list of the screening recommended for you and due dates:  Health Maintenance  Topic Date Due   COVID-19 Vaccine (2 - Janssen risk series) 09/22/2019   Complete foot exam   08/29/2022   Eye exam for diabetics  09/19/2022   Zoster (Shingles) Vaccine (1 of 2) 12/08/2022*   Pneumonia Vaccine (2 of 2 - PCV) 03/06/2023*   Flu Shot  11/08/2022   Yearly kidney health urinalysis for diabetes  03/06/2023   Hemoglobin A1C  03/09/2023   Yearly kidney function blood test for diabetes  09/07/2023   Medicare Annual Wellness Visit  10/31/2023   Colon Cancer Screening  04/17/2026   Hepatitis C Screening  Completed   HPV Vaccine  Aged Out   DTaP/Tdap/Td vaccine  Discontinued   Cologuard (Stool DNA test)  Discontinued  *Topic was postponed. The date shown is not the original due date.    Advanced directives: Advance directive discussed with you today. I have provided a copy for you to complete at home and have notarized. Once this is complete please bring a copy in to our office so we can scan it into your chart. Information on Advanced Care Planning can be found at [facility] Advance Health Care Directives Advance Health Care Directives ([email or web address]    Conditions/risks identified: Aim for 30 minutes of exercise or brisk walking, 6-8 glasses of water, and 5 servings of fruits and vegetables each day.   Next appointment: Follow up in one year for your annual wellness visit.   Preventive Care 32 Years and Older, Male  Preventive care refers to lifestyle choices and visits with your health care provider that  can promote health and wellness. What does preventive care include? A yearly physical exam. This is also called an annual well check. Dental exams once or twice a year. Routine eye exams. Ask your health care provider how often you should have your eyes checked. Personal lifestyle choices, including: Daily care of your teeth and gums. Regular physical activity. Eating a healthy diet. Avoiding tobacco and drug use. Limiting alcohol use. Practicing safe sex. Taking low doses of aspirin every day. Taking vitamin and mineral supplements as recommended by your health care provider. What happens during an annual well check? The services and screenings done by your health care provider during your annual well check will depend on your age, overall health, lifestyle risk factors, and family history of disease. Counseling  Your health care provider may ask you questions about your: Alcohol use. Tobacco use. Drug use. Emotional well-being. Home and relationship well-being. Sexual activity. Eating habits. History of falls. Memory and ability to understand (cognition). Work and work Astronomer. Screening  You may have the following tests or measurements: Height, weight, and BMI. Blood pressure. Lipid and cholesterol levels. These may be checked every 5 years, or more frequently if you are over 4 years old. Skin check. Lung cancer screening. You may have this screening every year starting at age 49 if you have a 30-pack-year history  of smoking and currently smoke or have quit within the past 15 years. Fecal occult blood test (FOBT) of the stool. You may have this test every year starting at age 70. Flexible sigmoidoscopy or colonoscopy. You may have a sigmoidoscopy every 5 years or a colonoscopy every 10 years starting at age 46. Prostate cancer screening. Recommendations will vary depending on your family history and other risks. Hepatitis C blood test. Hepatitis B blood test. Sexually  transmitted disease (STD) testing. Diabetes screening. This is done by checking your blood sugar (glucose) after you have not eaten for a while (fasting). You may have this done every 1-3 years. Abdominal aortic aneurysm (AAA) screening. You may need this if you are a current or former smoker. Osteoporosis. You may be screened starting at age 21 if you are at high risk. Talk with your health care provider about your test results, treatment options, and if necessary, the need for more tests. Vaccines  Your health care provider may recommend certain vaccines, such as: Influenza vaccine. This is recommended every year. Tetanus, diphtheria, and acellular pertussis (Tdap, Td) vaccine. You may need a Td booster every 10 years. Zoster vaccine. You may need this after age 40. Pneumococcal 13-valent conjugate (PCV13) vaccine. One dose is recommended after age 85. Pneumococcal polysaccharide (PPSV23) vaccine. One dose is recommended after age 16. Talk to your health care provider about which screenings and vaccines you need and how often you need them. This information is not intended to replace advice given to you by your health care provider. Make sure you discuss any questions you have with your health care provider. Document Released: 04/22/2015 Document Revised: 12/14/2015 Document Reviewed: 01/25/2015 Elsevier Interactive Patient Education  2017 [employer].  Fall Prevention in the Home Falls can cause injuries. They can happen to people of all ages. There are many things you can do to make your home safe and to help prevent falls. What can I do on the outside of my home? Regularly fix the edges of walkways and driveways and fix any cracks. Remove anything that might make you trip as you walk through a door, such as a raised step or threshold. Trim any bushes or trees on the path to your home. Use bright outdoor lighting. Clear any walking paths of anything that might make someone trip, such as  rocks or tools. Regularly check to see if handrails are loose or broken. Make sure that both sides of any steps have handrails. Any raised decks and porches should have guardrails on the edges. Have any leaves, snow, or ice cleared regularly. Use sand or salt on walking paths during winter. Clean up any spills in your garage right away. This includes oil or grease spills. What can I do in the bathroom? Use night lights. Install grab bars by the toilet and in the tub and shower. Do not use towel bars as grab bars. Use non-skid mats or decals in the tub or shower. If you need to sit down in the shower, use a plastic, non-slip stool. Keep the floor dry. Clean up any water that spills on the floor as soon as it happens. Remove soap buildup in the tub or shower regularly. Attach bath mats securely with double-sided non-slip rug tape. Do not have throw rugs and other things on the floor that can make you trip. What can I do in the bedroom? Use night lights. Make sure that you have a light by your bed that is easy to reach. Do not  use any sheets or blankets that are too big for your bed. They should not hang down onto the floor. Have a firm chair that has side arms. You can use this for support while you get dressed. Do not have throw rugs and other things on the floor that can make you trip. What can I do in the kitchen? Clean up any spills right away. Avoid walking on wet floors. Keep items that you use a lot in easy-to-reach places. If you need to reach something above you, use a strong step stool that has a grab bar. Keep electrical cords out of the way. Do not use floor polish or wax that makes floors slippery. If you must use wax, use non-skid floor wax. Do not have throw rugs and other things on the floor that can make you trip. What can I do with my stairs? Do not leave any items on the stairs. Make sure that there are handrails on both sides of the stairs and use them. Fix handrails  that are broken or loose. Make sure that handrails are as long as the stairways. Check any carpeting to make sure that it is firmly attached to the stairs. Fix any carpet that is loose or worn. Avoid having throw rugs at the top or bottom of the stairs. If you do have throw rugs, attach them to the floor with carpet tape. Make sure that you have a light switch at the top of the stairs and the bottom of the stairs. If you do not have them, ask someone to add them for you. What else can I do to help prevent falls? Wear shoes that: Do not have high heels. Have rubber bottoms. Are comfortable and fit you well. Are closed at the toe. Do not wear sandals. If you use a stepladder: Make sure that it is fully opened. Do not climb a closed stepladder. Make sure that both sides of the stepladder are locked into place. Ask someone to hold it for you, if possible. Clearly mark and make sure that you can see: Any grab bars or handrails. First and last steps. Where the edge of each step is. Use tools that help you move around (mobility aids) if they are needed. These include: Canes. Walkers. Scooters. Crutches. Turn on the lights when you go into a dark area. Replace any light bulbs as soon as they burn out. Set up your furniture so you have a clear path. Avoid moving your furniture around. If any of your floors are uneven, fix them. If there are any pets around you, be aware of where they are. Review your medicines with your doctor. Some medicines can make you feel dizzy. This can increase your chance of falling. Ask your doctor what other things that you can do to help prevent falls. This information is not intended to replace advice given to you by your health care provider. Make sure you discuss any questions you have with your health care provider. Document Released: 01/20/2009 Document Revised: 09/01/2015 Document Reviewed: 04/30/2014 Elsevier Interactive Patient Education  2017 [employer].

## 2022-11-06 ENCOUNTER — Other Ambulatory Visit: Payer: Self-pay | Admitting: Family

## 2022-11-06 DIAGNOSIS — E1159 Type 2 diabetes mellitus with other circulatory complications: Secondary | ICD-10-CM

## 2022-11-06 DIAGNOSIS — H1045 Other chronic allergic conjunctivitis: Secondary | ICD-10-CM | POA: Diagnosis not present

## 2022-11-06 DIAGNOSIS — E119 Type 2 diabetes mellitus without complications: Secondary | ICD-10-CM | POA: Diagnosis not present

## 2022-11-06 DIAGNOSIS — Z7984 Long term (current) use of oral hypoglycemic drugs: Secondary | ICD-10-CM | POA: Diagnosis not present

## 2022-11-06 DIAGNOSIS — H2511 Age-related nuclear cataract, right eye: Secondary | ICD-10-CM | POA: Diagnosis not present

## 2022-11-06 DIAGNOSIS — Z961 Presence of intraocular lens: Secondary | ICD-10-CM | POA: Diagnosis not present

## 2022-11-06 LAB — HM DIABETES EYE EXAM

## 2022-11-08 DIAGNOSIS — Z6833 Body mass index (BMI) 33.0-33.9, adult: Secondary | ICD-10-CM | POA: Diagnosis not present

## 2022-11-08 DIAGNOSIS — E119 Type 2 diabetes mellitus without complications: Secondary | ICD-10-CM | POA: Diagnosis not present

## 2022-11-08 DIAGNOSIS — M4310 Spondylolisthesis, site unspecified: Secondary | ICD-10-CM | POA: Diagnosis not present

## 2022-11-08 DIAGNOSIS — M545 Low back pain, unspecified: Secondary | ICD-10-CM | POA: Diagnosis not present

## 2022-11-08 DIAGNOSIS — G894 Chronic pain syndrome: Secondary | ICD-10-CM | POA: Diagnosis not present

## 2022-11-08 DIAGNOSIS — E6609 Other obesity due to excess calories: Secondary | ICD-10-CM | POA: Diagnosis not present

## 2022-11-08 DIAGNOSIS — Z79899 Other long term (current) drug therapy: Secondary | ICD-10-CM | POA: Diagnosis not present

## 2022-11-19 DIAGNOSIS — Z79899 Other long term (current) drug therapy: Secondary | ICD-10-CM | POA: Diagnosis not present

## 2022-11-19 DIAGNOSIS — M0609 Rheumatoid arthritis without rheumatoid factor, multiple sites: Secondary | ICD-10-CM | POA: Diagnosis not present

## 2022-11-19 DIAGNOSIS — M1991 Primary osteoarthritis, unspecified site: Secondary | ICD-10-CM | POA: Diagnosis not present

## 2022-11-19 DIAGNOSIS — E669 Obesity, unspecified: Secondary | ICD-10-CM | POA: Diagnosis not present

## 2022-11-19 DIAGNOSIS — Z6833 Body mass index (BMI) 33.0-33.9, adult: Secondary | ICD-10-CM | POA: Diagnosis not present

## 2022-11-27 DIAGNOSIS — M0609 Rheumatoid arthritis without rheumatoid factor, multiple sites: Secondary | ICD-10-CM | POA: Diagnosis not present

## 2022-12-07 DIAGNOSIS — Z79899 Other long term (current) drug therapy: Secondary | ICD-10-CM | POA: Diagnosis not present

## 2022-12-07 DIAGNOSIS — E119 Type 2 diabetes mellitus without complications: Secondary | ICD-10-CM | POA: Diagnosis not present

## 2022-12-07 DIAGNOSIS — E6609 Other obesity due to excess calories: Secondary | ICD-10-CM | POA: Diagnosis not present

## 2022-12-07 DIAGNOSIS — M4310 Spondylolisthesis, site unspecified: Secondary | ICD-10-CM | POA: Diagnosis not present

## 2022-12-07 DIAGNOSIS — G894 Chronic pain syndrome: Secondary | ICD-10-CM | POA: Diagnosis not present

## 2022-12-07 DIAGNOSIS — Z6833 Body mass index (BMI) 33.0-33.9, adult: Secondary | ICD-10-CM | POA: Diagnosis not present

## 2022-12-07 DIAGNOSIS — M545 Low back pain, unspecified: Secondary | ICD-10-CM | POA: Diagnosis not present

## 2022-12-11 ENCOUNTER — Encounter: Payer: Self-pay | Admitting: *Deleted

## 2022-12-17 ENCOUNTER — Other Ambulatory Visit: Payer: Self-pay | Admitting: Family

## 2022-12-18 DIAGNOSIS — L821 Other seborrheic keratosis: Secondary | ICD-10-CM | POA: Diagnosis not present

## 2022-12-18 DIAGNOSIS — D84821 Immunodeficiency due to drugs: Secondary | ICD-10-CM | POA: Diagnosis not present

## 2022-12-18 DIAGNOSIS — L219 Seborrheic dermatitis, unspecified: Secondary | ICD-10-CM | POA: Diagnosis not present

## 2022-12-18 DIAGNOSIS — L570 Actinic keratosis: Secondary | ICD-10-CM | POA: Diagnosis not present

## 2022-12-18 DIAGNOSIS — Z79899 Other long term (current) drug therapy: Secondary | ICD-10-CM | POA: Diagnosis not present

## 2022-12-18 DIAGNOSIS — D1801 Hemangioma of skin and subcutaneous tissue: Secondary | ICD-10-CM | POA: Diagnosis not present

## 2022-12-25 DIAGNOSIS — M0589 Other rheumatoid arthritis with rheumatoid factor of multiple sites: Secondary | ICD-10-CM | POA: Diagnosis not present

## 2023-01-07 DIAGNOSIS — Z79899 Other long term (current) drug therapy: Secondary | ICD-10-CM | POA: Diagnosis not present

## 2023-01-07 DIAGNOSIS — G894 Chronic pain syndrome: Secondary | ICD-10-CM | POA: Diagnosis not present

## 2023-01-07 DIAGNOSIS — E6609 Other obesity due to excess calories: Secondary | ICD-10-CM | POA: Diagnosis not present

## 2023-01-07 DIAGNOSIS — M4310 Spondylolisthesis, site unspecified: Secondary | ICD-10-CM | POA: Diagnosis not present

## 2023-01-07 DIAGNOSIS — Z6832 Body mass index (BMI) 32.0-32.9, adult: Secondary | ICD-10-CM | POA: Diagnosis not present

## 2023-01-07 DIAGNOSIS — M545 Low back pain, unspecified: Secondary | ICD-10-CM | POA: Diagnosis not present

## 2023-01-11 DIAGNOSIS — Z79899 Other long term (current) drug therapy: Secondary | ICD-10-CM | POA: Diagnosis not present

## 2023-01-22 DIAGNOSIS — M0609 Rheumatoid arthritis without rheumatoid factor, multiple sites: Secondary | ICD-10-CM | POA: Diagnosis not present

## 2023-01-23 ENCOUNTER — Other Ambulatory Visit: Payer: Medicare Other

## 2023-01-23 DIAGNOSIS — E7800 Pure hypercholesterolemia, unspecified: Secondary | ICD-10-CM | POA: Diagnosis not present

## 2023-01-23 DIAGNOSIS — E1165 Type 2 diabetes mellitus with hyperglycemia: Secondary | ICD-10-CM | POA: Diagnosis not present

## 2023-01-23 DIAGNOSIS — E11319 Type 2 diabetes mellitus with unspecified diabetic retinopathy without macular edema: Secondary | ICD-10-CM | POA: Diagnosis not present

## 2023-01-23 DIAGNOSIS — E559 Vitamin D deficiency, unspecified: Secondary | ICD-10-CM | POA: Diagnosis not present

## 2023-01-30 DIAGNOSIS — I10 Essential (primary) hypertension: Secondary | ICD-10-CM | POA: Diagnosis not present

## 2023-01-30 DIAGNOSIS — E11319 Type 2 diabetes mellitus with unspecified diabetic retinopathy without macular edema: Secondary | ICD-10-CM | POA: Diagnosis not present

## 2023-01-30 DIAGNOSIS — E7800 Pure hypercholesterolemia, unspecified: Secondary | ICD-10-CM | POA: Diagnosis not present

## 2023-01-30 DIAGNOSIS — D509 Iron deficiency anemia, unspecified: Secondary | ICD-10-CM | POA: Diagnosis not present

## 2023-01-30 DIAGNOSIS — E559 Vitamin D deficiency, unspecified: Secondary | ICD-10-CM | POA: Diagnosis not present

## 2023-01-30 DIAGNOSIS — E1165 Type 2 diabetes mellitus with hyperglycemia: Secondary | ICD-10-CM | POA: Diagnosis not present

## 2023-01-30 DIAGNOSIS — M069 Rheumatoid arthritis, unspecified: Secondary | ICD-10-CM | POA: Diagnosis not present

## 2023-02-06 DIAGNOSIS — E6609 Other obesity due to excess calories: Secondary | ICD-10-CM | POA: Diagnosis not present

## 2023-02-06 DIAGNOSIS — M4310 Spondylolisthesis, site unspecified: Secondary | ICD-10-CM | POA: Diagnosis not present

## 2023-02-06 DIAGNOSIS — G894 Chronic pain syndrome: Secondary | ICD-10-CM | POA: Diagnosis not present

## 2023-02-06 DIAGNOSIS — Z79899 Other long term (current) drug therapy: Secondary | ICD-10-CM | POA: Diagnosis not present

## 2023-02-06 DIAGNOSIS — Z6833 Body mass index (BMI) 33.0-33.9, adult: Secondary | ICD-10-CM | POA: Diagnosis not present

## 2023-02-06 DIAGNOSIS — E119 Type 2 diabetes mellitus without complications: Secondary | ICD-10-CM | POA: Diagnosis not present

## 2023-02-06 DIAGNOSIS — M545 Low back pain, unspecified: Secondary | ICD-10-CM | POA: Diagnosis not present

## 2023-02-08 ENCOUNTER — Encounter: Payer: Self-pay | Admitting: Family

## 2023-02-08 ENCOUNTER — Ambulatory Visit (INDEPENDENT_AMBULATORY_CARE_PROVIDER_SITE_OTHER): Payer: Medicare Other | Admitting: Family

## 2023-02-08 VITALS — BP 136/69 | HR 78 | Temp 97.3°F | Ht 70.0 in | Wt 226.0 lb

## 2023-02-08 DIAGNOSIS — Z91013 Allergy to seafood: Secondary | ICD-10-CM

## 2023-02-08 DIAGNOSIS — E669 Obesity, unspecified: Secondary | ICD-10-CM | POA: Diagnosis not present

## 2023-02-08 DIAGNOSIS — B370 Candidal stomatitis: Secondary | ICD-10-CM

## 2023-02-08 DIAGNOSIS — Z96651 Presence of right artificial knee joint: Secondary | ICD-10-CM | POA: Diagnosis not present

## 2023-02-08 DIAGNOSIS — M059 Rheumatoid arthritis with rheumatoid factor, unspecified: Secondary | ICD-10-CM | POA: Diagnosis not present

## 2023-02-08 DIAGNOSIS — E1159 Type 2 diabetes mellitus with other circulatory complications: Secondary | ICD-10-CM | POA: Diagnosis not present

## 2023-02-08 DIAGNOSIS — E1165 Type 2 diabetes mellitus with hyperglycemia: Secondary | ICD-10-CM

## 2023-02-08 DIAGNOSIS — D509 Iron deficiency anemia, unspecified: Secondary | ICD-10-CM

## 2023-02-08 DIAGNOSIS — I152 Hypertension secondary to endocrine disorders: Secondary | ICD-10-CM | POA: Diagnosis not present

## 2023-02-08 DIAGNOSIS — E1169 Type 2 diabetes mellitus with other specified complication: Secondary | ICD-10-CM

## 2023-02-08 DIAGNOSIS — J301 Allergic rhinitis due to pollen: Secondary | ICD-10-CM

## 2023-02-08 DIAGNOSIS — M19011 Primary osteoarthritis, right shoulder: Secondary | ICD-10-CM | POA: Diagnosis not present

## 2023-02-08 DIAGNOSIS — E785 Hyperlipidemia, unspecified: Secondary | ICD-10-CM

## 2023-02-08 DIAGNOSIS — L409 Psoriasis, unspecified: Secondary | ICD-10-CM

## 2023-02-08 LAB — BAYER DCA HB A1C WAIVED: HB A1C (BAYER DCA - WAIVED): 6.5 % — ABNORMAL HIGH (ref 4.8–5.6)

## 2023-02-08 MED ORDER — MAGIC MOUTHWASH W/LIDOCAINE: 10.0000 mL | Freq: Three times a day (TID) | ORAL | 3 refills | Status: DC | PRN

## 2023-02-08 MED ORDER — CETIRIZINE HCL 10 MG PO TABS: 10.0000 mg | ORAL_TABLET | Freq: Every day | ORAL | 1 refills | Status: DC

## 2023-02-08 MED ORDER — EPINEPHRINE 0.3 MG/0.3ML IJ SOAJ
0.3000 mg | INTRAMUSCULAR | 2 refills | Status: DC | PRN
Start: 2023-02-08 — End: 2024-01-08

## 2023-02-08 MED ORDER — FLUTICASONE PROPIONATE 50 MCG/ACT NA SUSP: 2.0000 | Freq: Every day | NASAL | 6 refills | Status: DC

## 2023-02-08 NOTE — Patient Instructions (Signed)

## 2023-02-08 NOTE — Progress Notes (Signed)
Subjective:    Patient ID: [name], male    DOB: [birth year], 67 y.o.   MRN: [id number]  Chief Complaint  Patient presents with   spots in mouth     raw   Medical Management of Chronic Issues   PT presents to the office today for chronic follow up. Pt has rheumatoid arthritis and followed by rheumatologists every 6 months. Pt is followed by Cardiologists annually. He is followed by Hematologists for iron deficiency annually.     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    He is obese with a BMI of 32 and DM and HTN.  He is complaining of "raw feeling" in bilateral cheek and lip.   He is complaining of allergic rhinitis after mowing his yard and helping his friend move got into dust.  He also has shellfish allergy and requesting Epi pen.  Hypertension This is a chronic problem. The current episode started more than 1 year ago. The problem has been resolved since onset. The problem is controlled. Associated symptoms include malaise/fatigue. Pertinent negatives include no blurred vision, peripheral edema or shortness of breath. Risk factors for coronary artery disease include dyslipidemia, diabetes mellitus, male gender and sedentary lifestyle. The current treatment provides moderate improvement.  Hyperlipidemia This is a chronic problem. The current episode started more than 1 year ago. The problem is controlled. Recent lipid tests were reviewed and are normal. Exacerbating diseases include obesity. Pertinent negatives include no shortness of breath. Current antihyperlipidemic treatment includes statins. The current treatment provides moderate improvement of lipids. Risk factors for coronary artery disease include dyslipidemia, diabetes mellitus, hypertension and a sedentary lifestyle.  Diabetes He presents for his follow-up diabetic visit. He has type 2 diabetes mellitus. Pertinent negatives for diabetes include no blurred vision and no foot paresthesias. Symptoms are stable.  Risk factors for coronary artery disease include dyslipidemia, diabetes mellitus and hypertension. He is following a generally healthy diet. His overall blood glucose range is 110-130 mg/dl. Eye exam is current.  Arthritis Presents for follow-up visit. He complains of pain and stiffness. Affected locations include the right knee, left knee, right MCP, left MCP, left shoulder and right shoulder (back). His pain is at a severity of 4/10.  Anemia Presents for follow-up visit. Symptoms include malaise/fatigue.      Review of Systems  Constitutional:  Positive for malaise/fatigue.  Eyes:  Negative for blurred vision.  Respiratory:  Negative for shortness of breath.   Musculoskeletal:  Positive for arthritis and stiffness.  All other systems reviewed and are negative.      Objective:   Physical Exam Vitals reviewed.  Constitutional:      General: He is not in acute distress.    Appearance: He is well-developed. He is obese.  HENT:     Head: Normocephalic.     Right Ear: Tympanic membrane normal.     Left Ear: Tympanic membrane normal.  Eyes:     General:        Right eye: No discharge.        Left eye: No discharge.     Pupils: Pupils are equal, round, and reactive to light.  Neck:     Thyroid: No thyromegaly.  Cardiovascular:     Rate and Rhythm: Normal rate and regular rhythm.     Heart sounds: Normal heart sounds. No murmur heard. Pulmonary:     Effort: Pulmonary effort is normal. No respiratory distress.     Breath sounds: Normal breath sounds. No  wheezing.  Abdominal:     General: Bowel sounds are normal. There is no distension.     Palpations: Abdomen is soft.     Tenderness: There is no abdominal tenderness.  Musculoskeletal:        General: No tenderness. Normal range of motion.     Cervical back: Normal range of motion and neck supple.  Skin:    General: Skin is warm and dry.     Findings: No erythema or rash.  Neurological:     Mental Status: He is alert and  oriented to person, place, and time.     Cranial Nerves: No cranial nerve deficit.     Deep Tendon Reflexes: Reflexes are normal and symmetric.  Psychiatric:        Behavior: Behavior normal.        Thought Content: Thought content normal.        Judgment: Judgment normal.    Diabetic Foot Exam - Simple   Simple Foot Form Diabetic Foot exam was performed with the following findings: Yes 02/08/2023 10:44 AM  Visual Inspection No deformities, no ulcerations, no other skin breakdown bilaterally: Yes Sensation Testing Intact to touch and monofilament testing bilaterally: Yes Pulse Check Posterior Tibialis and Dorsalis pulse intact bilaterally: Yes Comments      BP 136/69   Pulse 78   Temp (!) 97.3 F (36.3 C) (Temporal)   Ht 5\' 10"  (1.778 m)   Wt 226 lb (102.5 kg)   SpO2 95%   BMI 32.43 kg/m       Assessment & Plan:  [name] comes in today with chief complaint of spots in mouth  (raw) and Medical Management of Chronic Issues   Diagnosis and orders addressed:  1. Type 2 diabetes mellitus with hyperglycemia, without long-term current use of insulin (HCC) - Bayer DCA Hb A1c Waived - CMP14+EGFR - Microalbumin / creatinine urine ratio  2. Hyperlipidemia associated with type 2 diabetes mellitus (HCC) - CMP14+EGFR  3. Hypertension associated with diabetes (HCC) - CMP14+EGFR  4. Rheumatoid arthritis with positive rheumatoid factor, involving unspecified site (HCC) - CMP14+EGFR  5. Obesity (BMI 30-39.9) - CMP14+EGFR  6. Psoriasis - CMP14+EGFR  7. S/P right unicompartmental knee replacement - CMP14+EGFR  8. Osteoarthritis of right shoulder, unspecified osteoarthritis type - CMP14+EGFR  9. Iron deficiency anemia, unspecified iron deficiency anemia type - CMP14+EGFR  10. Shellfish allergy - EPINEPHrine 0.3 mg/0.3 mL IJ SOAJ injection; Inject 0.3 mg into the muscle as needed for anaphylaxis.  Dispense: 2 each; Refill: 2 - CMP14+EGFR  11. Oral  thrush Start magic mouthwash  - magic mouthwash w/lidocaine SOLN; Take 10 mLs by mouth 3 (three) times daily as needed for mouth pain.  Dispense: 450 mL; Refill: 3  12. Allergic rhinitis due to pollen, unspecified seasonality Start zyrtec and flonase - cetirizine (ZYRTEC ALLERGY) 10 MG tablet; Take 1 tablet (10 mg total) by mouth daily.  Dispense: 90 tablet; Refill: 1 - fluticasone (FLONASE) 50 MCG/ACT nasal spray; Place 2 sprays into both nostrils daily.  Dispense: 16 g; Refill: 6   Labs pending Health Maintenance reviewed Diet and exercise encouraged  Follow up plan: 4 months   Jannifer Rodney, FNP

## 2023-02-09 LAB — CMP14+EGFR
ALT: 20 [IU]/L (ref 0–44)
AST: 22 [IU]/L (ref 0–40)
Albumin: 4.2 g/dL (ref 3.9–4.9)
Alkaline Phosphatase: 74 [IU]/L (ref 44–121)
BUN/Creatinine Ratio: 12 (ref 10–24)
BUN: 15 mg/dL (ref 8–27)
Bilirubin Total: 0.3 mg/dL (ref 0.0–1.2)
CO2: 21 mmol/L (ref 20–29)
Calcium: 9.5 mg/dL (ref 8.6–10.2)
Chloride: 101 mmol/L (ref 96–106)
Creatinine, Ser: 1.23 mg/dL (ref 0.76–1.27)
Globulin, Total: 2.3 g/dL (ref 1.5–4.5)
Glucose: 286 mg/dL — ABNORMAL HIGH (ref 70–99)
Potassium: 4.4 mmol/L (ref 3.5–5.2)
Sodium: 140 mmol/L (ref 134–144)
Total Protein: 6.5 g/dL (ref 6.0–8.5)
eGFR: 64 mL/min/{1.73_m2} (ref >59)

## 2023-02-10 LAB — MICROALBUMIN / CREATININE URINE RATIO
Creatinine, Urine: 158.2 mg/dL
Microalb/Creat Ratio: 4 mg/g{creat} (ref 0–29)
Microalbumin, Urine: 5.8 ug/mL

## 2023-02-19 DIAGNOSIS — Z111 Encounter for screening for respiratory tuberculosis: Secondary | ICD-10-CM | POA: Diagnosis not present

## 2023-02-19 DIAGNOSIS — M0609 Rheumatoid arthritis without rheumatoid factor, multiple sites: Secondary | ICD-10-CM | POA: Diagnosis not present

## 2023-02-19 DIAGNOSIS — Z79899 Other long term (current) drug therapy: Secondary | ICD-10-CM | POA: Diagnosis not present

## 2023-02-19 DIAGNOSIS — R5383 Other fatigue: Secondary | ICD-10-CM | POA: Diagnosis not present

## 2023-03-01 ENCOUNTER — Ambulatory Visit: Payer: Medicare Other | Admitting: Family Medicine

## 2023-03-01 ENCOUNTER — Encounter: Payer: Self-pay | Admitting: Family Medicine

## 2023-03-01 VITALS — BP 123/80 | HR 84 | Temp 98.2°F | Ht 70.0 in | Wt 231.0 lb

## 2023-03-01 DIAGNOSIS — H6123 Impacted cerumen, bilateral: Secondary | ICD-10-CM

## 2023-03-01 NOTE — Progress Notes (Signed)
Subjective:  Patient ID: [name], male    DOB: [birth year], 66 y.o.   MRN: [id number]  Patient Care Team: Junie Spencer, FNP as PCP - General (Nurse Practitioner) Wendall Stade, MD as PCP - Cardiology (Cardiology) Zenovia Jordan, MD as Consulting Physician (Rheumatology) Wendall Stade, MD as Consulting Physician (Cardiology) Dorisann Frames, MD as Referring Physician (Endocrinology) Doreatha Massed, MD as Consulting Physician (Hematology) Teryl Lucy, MD as Consulting Physician (Orthopedic Surgery) Merryl Hacker, NP as Nurse Practitioner (Nurse Practitioner)   Chief Complaint:  Ear Fullness (Right side/X 1 week)   HPI: [name] is a 67 y.o. male presenting on 03/01/2023 for Ear Fullness (Right side/X 1 week) States that it feels like he is talking in a barrel. Denies pain, fever, cough, rhinorrhea. States that his ears always itch. Recently been in a lot of dust and felt that she had head cold after that. Then felt he was better. Has tried peroxide and rubbing etoh. States that he has had impacted cerumen in the past, used to happen a lot when he was younger.   Relevant past medical, surgical, family, and social history reviewed and updated as indicated.  Allergies and medications reviewed and updated. Data reviewed: Chart in Epic.   Past Medical History:  Diagnosis Date   Anemia    taking iron   Arthritis    rheumatoid arthritis   Diabetes mellitus    Heart murmur    Hyperlipidemia    Hypertension    Primary localized osteoarthritis of right knee 05/13/2015   Scapho-lunate dissociation, right    Vitamin D deficiency     Past Surgical History:  Procedure Laterality Date   CARDIAC CATHETERIZATION  2005   normal   CARPAL TUNNEL RELEASE  2009   both hands   COLONOSCOPY WITH PROPOFOL N/A 04/17/2021   Procedure: COLONOSCOPY WITH PROPOFOL;  Surgeon: Lanelle Bal, DO;  Location: [facility];  Service: Endoscopy;  Laterality: N/A;  8:15am,  ASA 2   HARDWARE REMOVAL Right 11/14/2017   Procedure: REMOVAL ACCUTRAC SCREW RIGHT WRIST;  Surgeon: Cindee Salt, MD;  Location: [facility];  Service: Orthopedics;  Laterality: Right;   KNEE ARTHROSCOPY     rt   PARTIAL KNEE ARTHROPLASTY Right 05/13/2015   Procedure: RIGHT UNI KNEE ARTHROPLASTY;  Surgeon: Teryl Lucy, MD;  Location: [facility];  Service: Orthopedics;  Laterality: Right;   POLYPECTOMY  04/17/2021   Procedure: POLYPECTOMY;  Surgeon: Lanelle Bal, DO;  Location: [facility];  Service: Endoscopy;;   scaphoid excision Left 2012   Dr Teressa Senter   TONSILLECTOMY     as a child   TOTAL SHOULDER ARTHROPLASTY Right 08/23/2020   Procedure: TOTAL SHOULDER ARTHROPLASTY;  Surgeon: Teryl Lucy, MD;  Location: [facility];  Service: Orthopedics;  Laterality: Right;   WRIST FUSION Right 03/14/2017   Procedure: SCAPHOID EXCISION ULNAR FOUR BONE FUSION RIGHT;  Surgeon: Cindee Salt, MD;  Location: [facility];  Service: Orthopedics;  Laterality: Right;    Social History   Socioeconomic History   Marital status: Married    Spouse name: Rosey Bath   Number of children: 2   Years of education: Not on file   Highest education level: Not on file  Occupational History   Occupation: Disabled  Tobacco Use   Smoking status: Former    Current packs/day: 0.00    Types: Cigarettes    Quit date: 03/05/1991    Years since quitting:  32.0   Smokeless tobacco: Never  Vaping Use   Vaping status: Never Used  Substance and Sexual Activity   Alcohol use: Yes    Comment: rare   Drug use: No   Sexual activity: Not on file  Other Topics Concern   Not on file  Social History Narrative   Children live in [place]   Social Determinants of Health   Financial Resource Strain: Low Risk  (10/31/2022)   Overall Financial Resource Strain (CARDIA)    Difficulty of Paying Living Expenses: Not hard at all  Food Insecurity: No Food Insecurity (10/31/2022)   Hunger Vital Sign     Worried About Running Out of Food in the Last Year: Never true    Ran Out of Food in the Last Year: Never true  Transportation Needs: No Transportation Needs (10/31/2022)   PRAPARE - Administrator, Civil Service (Medical): No    Lack of Transportation (Non-Medical): No  Physical Activity: Inactive (10/31/2022)   Exercise Vital Sign    Days of Exercise per Week: 0 days    Minutes of Exercise per Session: 0 min  Stress: No Stress Concern Present (10/31/2022)   [employer] of Occupational Health - Occupational Stress Questionnaire    Feeling of Stress : Not at all  Social Connections: Socially Integrated (10/31/2022)   Social Connection and Isolation Panel [NHANES]    Frequency of Communication with Friends and Family: More than three times a week    Frequency of Social Gatherings with Friends and Family: More than three times a week    Attends Religious Services: More than 4 times per year    Active Member of [employer] or Organizations: Yes    Attends Engineer, structural: More than 4 times per year    Marital Status: Married  Catering manager Violence: Not At Risk (10/31/2022)   Humiliation, Afraid, Rape, and Kick questionnaire    Fear of Current or Ex-Partner: No    Emotionally Abused: No    Physically Abused: No    Sexually Abused: No    Outpatient Encounter Medications as of 03/01/2023  Medication Sig   abatacept (ORENCIA) 250 MG injection 1000mg  (4vials) Intravenous Q 4 weeks   aspirin 81 MG chewable tablet Chew 81 mg by mouth daily.   cetirizine (ZYRTEC ALLERGY) 10 MG tablet Take 1 tablet (10 mg total) by mouth daily.   EPINEPHrine 0.3 mg/0.3 mL IJ SOAJ injection Inject 0.3 mg into the muscle as needed for anaphylaxis.   fluticasone (FLONASE) 50 MCG/ACT nasal spray Place 2 sprays into both nostrils daily.   glimepiride (AMARYL) 2 MG tablet Take 1 tablet (2 mg total) by mouth daily.   hydrochlorothiazide (MICROZIDE) 12.5 MG capsule TAKE (1) CAPSULE DAILY    ketoconazole (NIZORAL) 2 % cream Apply 1 application. topically daily.   losartan (COZAAR) 100 MG tablet Take 1 tablet (100 mg total) by mouth daily.   magic mouthwash w/lidocaine SOLN Take 10 mLs by mouth 3 (three) times daily as needed for mouth pain.   meloxicam (MOBIC) 15 MG tablet Take 1 tablet (15 mg total) by mouth daily.   metFORMIN (GLUCOPHAGE) 1000 MG tablet TAKE 1 TABLET IN THE MORNING AND 1&1/2 TABLETS IN THE EVENING   Multiple Vitamins-Minerals (MULTI FOR HIM) TABS Take 1 tablet by mouth daily.   naloxone (NARCAN) nasal spray 4 mg/0.1 mL Place 1 spray into the nose as needed (opioid overdose).   oxyCODONE-acetaminophen (PERCOCET) 10-325 MG tablet Take 1-2 tablets by mouth every  6 (six) hours as needed for pain. MAXIMUM TOTAL ACETAMINOPHEN DOSE IS 4000 MG PER DAY   simvastatin (ZOCOR) 10 MG tablet Take 1 tablet (10 mg total) by mouth daily.   valACYclovir (VALTREX) 1000 MG tablet TAKE 1 TABLET 2 TIMES A DAY   No facility-administered encounter medications on file as of 03/01/2023.    Allergies  Allergen Reactions   Shellfish Allergy Shortness Of Breath and Swelling   Leflunomide     LFT ELEVATION   Methotrexate     LFT elevation   Canagliflozin Other (See Comments)    Causes bladder infections    Cephalexin Other (See Comments)    nervous    Doxycycline Nausea And Vomiting   Dulaglutide Diarrhea and Nausea Only    Review of Systems As per HPI  Objective:  BP 123/80   Pulse 84   Temp 98.2 F (36.8 C)   Ht 5\' 10"  (1.778 m)   Wt 231 lb (104.8 kg)   SpO2 95%   BMI 33.15 kg/m    Wt Readings from Last 3 Encounters:  03/01/23 231 lb (104.8 kg)  02/08/23 226 lb (102.5 kg)  10/31/22 224 lb (101.6 kg)   Physical Exam Constitutional:      General: He is awake. He is not in acute distress.    Appearance: Normal appearance. He is well-developed and well-groomed. He is not ill-appearing, toxic-appearing or diaphoretic.  HENT:     Right Ear: There is impacted  cerumen.     Left Ear: There is impacted cerumen.     Ears:     Comments: Left ear with hardened, yellow and black cerumen  Right ear with soft, yellow cerumen   Cardiovascular:     Rate and Rhythm: Normal rate and regular rhythm.     Pulses: Normal pulses.          Radial pulses are 2+ on the right side and 2+ on the left side.       Posterior tibial pulses are 2+ on the right side and 2+ on the left side.     Heart sounds: Normal heart sounds. No murmur heard.    No gallop.  Pulmonary:     Effort: Pulmonary effort is normal. No respiratory distress.     Breath sounds: Decreased air movement present. No stridor or transmitted upper airway sounds. Examination of the right-upper field reveals decreased breath sounds. Examination of the left-upper field reveals decreased breath sounds. Examination of the right-middle field reveals decreased breath sounds. Examination of the left-middle field reveals decreased breath sounds. Examination of the right-lower field reveals decreased breath sounds. Examination of the left-lower field reveals decreased breath sounds. Decreased breath sounds present. No wheezing, rhonchi or rales.  Musculoskeletal:     Cervical back: Full passive range of motion without pain and neck supple.     Right lower leg: No edema.     Left lower leg: No edema.  Skin:    General: Skin is warm.     Capillary Refill: Capillary refill takes less than 2 seconds.  Neurological:     General: No focal deficit present.     Mental Status: He is alert, oriented to person, place, and time and easily aroused. Mental status is at baseline.     GCS: GCS eye subscore is 4. GCS verbal subscore is 5. GCS motor subscore is 6.     Motor: No weakness.  Psychiatric:        Attention and Perception: Attention and perception  normal.        Mood and Affect: Mood and affect normal.        Speech: Speech normal.        Behavior: Behavior normal. Behavior is cooperative.        Thought Content:  Thought content normal. Thought content does not include homicidal or suicidal ideation. Thought content does not include homicidal or suicidal plan.        Cognition and Memory: Cognition and memory normal.        Judgment: Judgment normal.     Results for orders placed or performed in visit on 02/08/23  Bayer DCA Hb A1c Waived  Result Value Ref Range   HB A1C (BAYER DCA - WAIVED) 6.5 (H) 4.8 - 5.6 %  CMP14+EGFR  Result Value Ref Range   Glucose 286 (H) 70 - 99 mg/dL   BUN 15 8 - 27 mg/dL   Creatinine, Ser 9.62 0.76 - 1.27 mg/dL   eGFR 64 >95 MW/UXL/2.44   BUN/Creatinine Ratio 12 10 - 24   Sodium 140 134 - 144 mmol/L   Potassium 4.4 3.5 - 5.2 mmol/L   Chloride 101 96 - 106 mmol/L   CO2 21 20 - 29 mmol/L   Calcium 9.5 8.6 - 10.2 mg/dL   Total Protein 6.5 6.0 - 8.5 g/dL   Albumin 4.2 3.9 - 4.9 g/dL   Globulin, Total 2.3 1.5 - 4.5 g/dL   Bilirubin Total 0.3 0.0 - 1.2 mg/dL   Alkaline Phosphatase 74 44 - 121 IU/L   AST 22 0 - 40 IU/L   ALT 20 0 - 44 IU/L  Microalbumin / creatinine urine ratio  Result Value Ref Range   Creatinine, Urine 158.2 Not Estab. mg/dL   Microalbumin, Urine 5.8 Not Estab. ug/mL   Microalb/Creat Ratio 4 0 - 29 mg/g creat       03/01/2023   10:48 AM 02/08/2023   10:25 AM 10/31/2022    9:12 AM 03/05/2022    8:04 AM 12/28/2021    3:56 PM  Depression screen PHQ 2/9  Decreased Interest 0 0 0 0   Down, Depressed, Hopeless 0 0 0 0   PHQ - 2 Score 0 0 0 0   Altered sleeping 0 0  0 0  Tired, decreased energy 0 0  0 0  Change in appetite 0 0  0 0  Feeling bad or failure about yourself  0 0  0 0  Trouble concentrating 0 0  0 0  Moving slowly or fidgety/restless 0 0  0 0  Suicidal thoughts 0 0  0 0  PHQ-9 Score 0 0  0   Difficult doing work/chores Not difficult at all Not difficult at all  Not difficult at all Not difficult at all       03/01/2023   10:49 AM 02/08/2023   10:25 AM 12/28/2021    3:55 PM 11/26/2019    9:41 AM  GAD 7 : Generalized Anxiety  Score  Nervous, Anxious, on Edge 0 0 0 0  Control/stop worrying 0 0 0 0  Worry too much - different things 0 0 0 0  Trouble relaxing 0 0 0 0  Restless 0 0 0 0  Easily annoyed or irritable 0 0 0 0  Afraid - awful might happen 0 0 0 0  Total GAD 7 Score 0 0 0 0  Anxiety Difficulty Not difficult at all Not difficult at all Not difficult at all Not difficult at all  Pertinent labs & imaging results that were available during my care of the patient were reviewed by me and considered in my medical decision making.  Assessment & Plan:  [name]" was seen today for ear fullness.  Diagnoses and all orders for this visit:  Impacted cerumen, bilateral Bilateral ear canals successfully lavaged.   Continue all other maintenance medications.  Follow up plan: Return if symptoms worsen or fail to improve.   Continue healthy lifestyle choices, including diet (rich in fruits, vegetables, and lean proteins, and low in salt and simple carbohydrates) and exercise (at least 30 minutes of moderate physical activity daily).  Written and verbal instructions provided   The above assessment and management plan was discussed with the patient. The patient verbalized understanding of and has agreed to the management plan. Patient is aware to call the clinic if they develop any new symptoms or if symptoms persist or worsen. Patient is aware when to return to the clinic for a follow-up visit. Patient educated on when it is appropriate to go to the emergency department.   Neale Burly, DNP-FNP [facility] [address] [place], [place] [address] [phone]

## 2023-03-06 ENCOUNTER — Encounter: Payer: Self-pay | Admitting: Physician Assistant

## 2023-03-06 ENCOUNTER — Ambulatory Visit: Payer: Medicare Other | Attending: Physician Assistant | Admitting: Emergency Medicine

## 2023-03-06 VITALS — BP 120/60 | HR 68 | Ht 70.0 in | Wt 226.4 lb

## 2023-03-06 DIAGNOSIS — I2510 Atherosclerotic heart disease of native coronary artery without angina pectoris: Secondary | ICD-10-CM | POA: Insufficient documentation

## 2023-03-06 DIAGNOSIS — E785 Hyperlipidemia, unspecified: Secondary | ICD-10-CM | POA: Insufficient documentation

## 2023-03-06 DIAGNOSIS — M059 Rheumatoid arthritis with rheumatoid factor, unspecified: Secondary | ICD-10-CM | POA: Insufficient documentation

## 2023-03-06 DIAGNOSIS — E119 Type 2 diabetes mellitus without complications: Secondary | ICD-10-CM | POA: Insufficient documentation

## 2023-03-06 DIAGNOSIS — I10 Essential (primary) hypertension: Secondary | ICD-10-CM | POA: Insufficient documentation

## 2023-03-06 NOTE — Progress Notes (Signed)
Cardiology Office Note:    Date:  03/06/2023  ID:  Jerquan, Bleyer [birth year], MRN [id number] PCP: Junie Spencer, FNP   HeartCare Providers Cardiologist:  Charlton Haws, MD       Patient Profile:     [name] is a 67 year old male with past medical history of T2DM, hypertension, rheumatoid arthritis, obesity, former smoker, IDA, hyperlipidemia, aortic atherosclerosis.  Myoview performed 03/28/2016 for atypical chest pain showing no evidence of prior infarct or ischemia, normal low risk study.  CT cardiac scoring performed 09/24/2019 showing calcium score of 86.5, 50th percentile for age and sex.      History of Present Illness:  [name] is a 67 y.o. male who returns for his 1 year follow-up.  Today he is doing well from a cardiac standpoint.  He is retired and is enjoying his retired lifestyle.  He he states this is one of the best years he has had in a long time.  He notes that he continues to be physically active at home with doing yard work such as mowing, weed eating and blowing leaves.  He notes he used to enjoy woodworking and golfing but cannot due to his rheumatoid arthritis and chronic pain.  His rheumatoid arthritis is significant and does limit his physical activity, he sees rheumatology on a regular basis.  He is taking meloxicam daily and on abatacept every 4 weeks which seems to be helping his pain.  He has been focusing on eating healthy losing weight.  He notes that he has been focusing on eating less which has helped him shed a couple pounds.  He does not take his blood pressure at home.  He follows with hematology regularly for his anemia.  He denies any significant cardiac issues over the past year.  He denies any chest pain, shortness of breath, DOE, orthopnea, syncope, near syncope, dizziness, lightheadedness.          Review of Systems  Constitutional: Negative for weight gain and weight loss.  Cardiovascular:  Negative for chest pain,  claudication, cyanosis, dyspnea on exertion, irregular heartbeat, leg swelling, near-syncope, orthopnea, palpitations, paroxysmal nocturnal dyspnea and syncope.  Respiratory:  Negative for cough, hemoptysis and shortness of breath.   Gastrointestinal:  Negative for abdominal pain, hematochezia and melena.  Genitourinary:  Negative for hematuria.  Neurological:  Negative for dizziness and light-headedness.     See HPI     Studies Reviewed:   EKG Interpretation Date/Time:  Wednesday March 06 2023 07:58:14 EST Ventricular Rate:  68 PR Interval:  218 QRS Duration:  114 QT Interval:  404 QTC Calculation: 429 R Axis:   -29  Text Interpretation: Sinus rhythm with 1st degree A-V block Confirmed by Rise Paganini [phone] on 03/06/2023 8:00:50 AM    CT cardiac scoring 09/24/2019 Coronary calcium score of 86.5. This was 50 percentile for age and sex matched control.  Myoview stress 03/28/2016 Nuclear stress EF: 51%. Blood pressure demonstrated a hypertensive response to exercise. There was no ST segment deviation noted during stress. The study is normal. This is a low risk study. The left ventricular ejection fraction is mildly decreased (45-54%).   Normal exercise nuclear stress test with no evidence of prior infarct or ischemia.  Decreased exercise capacity. Hypertensive response to stress (217/54 mmHg). Calculated LVEF is 51% but appears higher visually. A correlation with an echocardiogram is recommended.  Risk Assessment/Calculations:             Physical Exam:  VS:  BP 120/60   Pulse 68   Ht 5\' 10"  (1.778 m)   Wt 226 lb 6.4 oz (102.7 kg)   SpO2 95%   BMI 32.49 kg/m    Wt Readings from Last 3 Encounters:  03/06/23 226 lb 6.4 oz (102.7 kg)  03/01/23 231 lb (104.8 kg)  02/08/23 226 lb (102.5 kg)    Constitutional:      Appearance: Normal and healthy appearance.  Neck:     Vascular: JVD normal.  Pulmonary:     Effort: Pulmonary effort is normal.     Breath  sounds: Normal breath sounds.  Chest:     Chest wall: Not tender to palpatation.  Cardiovascular:     PMI at left midclavicular line. Normal rate. Regular rhythm. Normal S1. Normal S2.      Murmurs: There is no murmur.     No gallop.  No click. No rub.  Pulses:    Intact distal pulses.  Edema:    Peripheral edema absent.  Musculoskeletal:     Cervical back: Neck supple. Skin:    General: Skin is warm.  Neurological:     General: No focal deficit present.     Mental Status: Oriented to person, place and time.  Psychiatric:        Behavior: Behavior is cooperative.        Assessment and Plan:  CAD / aortic atherosclerosis -09/24/2019 CT cardiac scoring 86.5, this was 50 percentile for age and sex -03/28/2016 Myoview study was normal low risk -EKG NSR with first-degree heart block -Stable with no anginal symptoms, no indication for ischemic evaluation  -GDMT aspirin 81 mg, losartan 100 mg, simvastatin 10 mg -Increase fiber, vegetables, fruits in diet.  Decrease processed foods and foods high in sugar  Hypertension -BP today 120/60, well-controlled -Begin taking blood pressure at home periodically -Continue hydrochlorothiazide 12.5 mg, losartan 100 mg -Aim for 150 minutes of moderate intensity exercise weekly  T2DM -Hemoglobin A1c 6.5 on 02/08/2020, continues to trend down -Followed by PCP, continue glimepiride, metformin -Focus on heart healthy dieting  Hyperlipidemia, goal <70 -LDL 47, HDL 43, 12/26, TC 829 on 09/07/2022 -LDL below goal, no myalgias -Continue simvastatin 10 mg  Rheumatoid arthritis -Continue to follow rheumatology and monthly injections of abatacept                 Dispo:  Return in about 1 year (around 03/05/2024).  Signed, Denyce Robert, NP

## 2023-03-06 NOTE — Patient Instructions (Signed)
Medication Instructions:  Your physician recommends that you continue on your current medications as directed. Please refer to the Current Medication list given to you today.  *If you need a refill on your cardiac medications before your next appointment, please call your pharmacy*   Lab Work: None ordered  If you have labs (blood work) drawn today and your tests are completely normal, you will receive your results only by: MyChart Message (if you have MyChart) OR A paper copy in the mail If you have any lab test that is abnormal or we need to change your treatment, we will call you to review the results.   Testing/Procedures: None ordered   Follow-Up: At [facility], you and your health needs are our priority.  As part of our continuing mission to provide you with exceptional heart care, we have created designated Provider Care Teams.  These Care Teams include your primary Cardiologist (physician) and Advanced Practice Providers (APPs -  Physician Assistants and Nurse Practitioners) who all work together to provide you with the care you need, when you need it.  We recommend signing up for the patient portal called "MyChart".  Sign up information is provided on this After Visit Summary.  MyChart is used to connect with patients for Virtual Visits (Telemedicine).  Patients are able to view lab/test results, encounter notes, upcoming appointments, etc.  Non-urgent messages can be sent to your provider as well.   To learn more about what you can do with MyChart, go to [email or web address].    Your next appointment:   1 year(s)  Provider:   Charlton Haws, MD     Other Instructions

## 2023-03-08 DIAGNOSIS — Z6832 Body mass index (BMI) 32.0-32.9, adult: Secondary | ICD-10-CM | POA: Diagnosis not present

## 2023-03-08 DIAGNOSIS — I10 Essential (primary) hypertension: Secondary | ICD-10-CM | POA: Diagnosis not present

## 2023-03-08 DIAGNOSIS — M4310 Spondylolisthesis, site unspecified: Secondary | ICD-10-CM | POA: Diagnosis not present

## 2023-03-08 DIAGNOSIS — M069 Rheumatoid arthritis, unspecified: Secondary | ICD-10-CM | POA: Diagnosis not present

## 2023-03-08 DIAGNOSIS — E119 Type 2 diabetes mellitus without complications: Secondary | ICD-10-CM | POA: Diagnosis not present

## 2023-03-08 DIAGNOSIS — M1990 Unspecified osteoarthritis, unspecified site: Secondary | ICD-10-CM | POA: Diagnosis not present

## 2023-03-08 DIAGNOSIS — Z6833 Body mass index (BMI) 33.0-33.9, adult: Secondary | ICD-10-CM | POA: Diagnosis not present

## 2023-03-08 DIAGNOSIS — G894 Chronic pain syndrome: Secondary | ICD-10-CM | POA: Diagnosis not present

## 2023-03-08 DIAGNOSIS — M545 Low back pain, unspecified: Secondary | ICD-10-CM | POA: Diagnosis not present

## 2023-03-08 DIAGNOSIS — E6609 Other obesity due to excess calories: Secondary | ICD-10-CM | POA: Diagnosis not present

## 2023-03-08 DIAGNOSIS — Z79899 Other long term (current) drug therapy: Secondary | ICD-10-CM | POA: Diagnosis not present

## 2023-03-11 ENCOUNTER — Other Ambulatory Visit: Payer: Medicare Other

## 2023-03-11 DIAGNOSIS — D508 Other iron deficiency anemias: Secondary | ICD-10-CM

## 2023-03-12 LAB — CBC WITH DIFFERENTIAL/PLATELET
Basophils Absolute: 0.1 10*3/uL (ref 0.0–0.2)
Basos: 1 %
EOS (ABSOLUTE): 1 10*3/uL — ABNORMAL HIGH (ref 0.0–0.4)
Eos: 10 %
Hematocrit: 44.1 % (ref 37.5–51.0)
Hemoglobin: 14.6 g/dL (ref 13.0–17.7)
Immature Grans (Abs): 0 10*3/uL (ref 0.0–0.1)
Immature Granulocytes: 0 %
Lymphocytes Absolute: 3 10*3/uL (ref 0.7–3.1)
Lymphs: 29 %
MCH: 31.9 pg (ref 26.6–33.0)
MCHC: 33.1 g/dL (ref 31.5–35.7)
MCV: 97 fL (ref 79–97)
Monocytes Absolute: 0.6 10*3/uL (ref 0.1–0.9)
Monocytes: 6 %
Neutrophils Absolute: 5.6 10*3/uL (ref 1.4–7.0)
Neutrophils: 54 %
Platelets: 289 10*3/uL (ref 150–450)
RBC: 4.57 x10E6/uL (ref 4.14–5.80)
RDW: 11.8 % (ref 11.6–15.4)
WBC: 10.4 10*3/uL (ref 3.4–10.8)

## 2023-03-12 LAB — IRON AND TIBC
Iron Saturation: 39 % (ref 15–55)
Iron: 133 ug/dL (ref 38–169)
Total Iron Binding Capacity: 340 ug/dL (ref 250–450)
UIBC: 207 ug/dL (ref 111–343)

## 2023-03-12 LAB — FERRITIN: Ferritin: 41 ng/mL (ref 30–400)

## 2023-03-13 ENCOUNTER — Other Ambulatory Visit: Payer: Self-pay | Admitting: Family

## 2023-03-13 DIAGNOSIS — E1165 Type 2 diabetes mellitus with hyperglycemia: Secondary | ICD-10-CM

## 2023-03-13 DIAGNOSIS — E1159 Type 2 diabetes mellitus with other circulatory complications: Secondary | ICD-10-CM

## 2023-03-15 ENCOUNTER — Ambulatory Visit: Payer: Medicare Other | Admitting: Family

## 2023-03-18 ENCOUNTER — Ambulatory Visit: Payer: Medicare Other | Admitting: Family

## 2023-03-18 NOTE — Progress Notes (Unsigned)
VIRTUAL VISIT via TELEPHONE NOTE [facility]   I connected with [name]  on 03/19/23 at 9:28 AM by telephone and verified that I am speaking with the correct person using two identifiers.  Location: Patient: Home Provider: [facility]   I discussed the limitations, risks, security and privacy concerns of performing an evaluation and management service by telephone and the availability of in person appointments. I also discussed with the patient that there may be a patient responsible charge related to this service. The patient expressed understanding and agreed to proceed.  REASON FOR VISIT:  Follow-up for iron deficiency and B12 deficiency   PRIOR THERAPY: Intermittent IV iron   CURRENT THERAPY: Oral iron tablets and B12 tablets  INTERVAL HISTORY:  [name] is contacted today for follow-up of iron deficiency anemia.  He was last evaluated via telemedicine visit by Rojelio Brenner PA-C on 09/14/2022.  At today's visit, he reports feeling well.  No recent hospitalizations, surgeries, or changes in baseline health status.   He denies any fatigue and reports that he has good energy.   He is taking iron tablet daily.  He takes B12 tablet "when he remembers it," about once every other week.  He denies any bleeding such as epistaxis, hematochezia, or melena.  He has some chronic intermittent restless leg symptoms.  He denies any pica, headaches, chest pain, dyspnea on exertion, lightheadedness, or syncope. He continues to take Orencia for his rheumatoid arthritis.    He reports 75% energy and 100% appetite.  He reports that he is maintaining a stable weight.    REVIEW OF SYSTEMS: No acute complaints at today's visit  Review of Systems  Constitutional:  Negative for chills, diaphoresis, fever, malaise/fatigue and weight loss.  Respiratory:  Negative for cough and shortness of breath.   Cardiovascular:  Negative for chest pain and palpitations.   Gastrointestinal:  Negative for abdominal pain, blood in stool, melena, nausea and vomiting.  Neurological:  Negative for dizziness and headaches.     PHYSICAL EXAM: (per limitations of virtual telephone visit)  The patient is alert and oriented x 3, exhibiting adequate mentation, good mood, and ability to speak in full sentences and execute sound judgement.  ASSESSMENT & PLAN:  1.  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: - History of positive Cologuard test - Colonoscopy (04/17/2021): Nonbleeding internal hemorrhoids, diverticulosis, polyp x1 - Received Feraheme x2 (with steroid premedication) on 12/26/2020 and 01/02/2021 - He has been taking oral ferrous sulfate daily - No epistaxis, hematochezia, melena - Most recent labs (03/11/2023): Hgb 14.6/MCV 97, ferritin 41, iron saturation 39% - Suspect malabsorption in the setting of chronic disease and inflammation, possibly in the setting of acid blocking pill for GERD, which he takes 3 times per week - PLAN: Hemoglobin is normal, ferritin improving but still <100.  Since he is asymptomatic, we will hold off on any IV iron at this time.  Continue oral iron supplementation. - Repeat labs in 6 months with OFFICE visit the week after.  (Annual office visit, otherwise patient can be seen via telephone visit)  2.  B12 deficiency: - Patient is taking vitamin B12 cyanocobalamin 1000 mcg tablet "when he remembers it," about once every other week - Most recent labs (09/07/2022): Normal vitamin B12 at 728, normal methylmalonic acid - PLAN: Continue daily B12 tablet.  Will recheck B12/MMA annually (May/June 2025)   3.  Rheumatoid arthritis: - His arthritis involves small joints of the hands  and right shoulder.   - He is on Orencia every 4 weeks.    4.  Tobacco abuse: - He quit smoking in 1992.  Smoked 1.5 pack/day for 20 years. - He is not eligible for LDCT (quit > 15 years ago)  PLAN SUMMARY: >> Labs in 6 months at [facility] - (CBC/D, ferritin,  iron/TIBC, B12, MMA)  >> OFFICE visit after labs  ** Last office visit 02/26/2022     I discussed the assessment and treatment plan with the patient. The patient was provided an opportunity to ask questions and all were answered. The patient agreed with the plan and demonstrated an understanding of the instructions.   The patient was advised to call back or seek an in-person evaluation if the symptoms worsen or if the condition fails to improve as anticipated.  I provided 15 minutes of non-face-to-face time during this encounter.  Carnella Guadalajara, PA-C 03/19/23 9:43 AM

## 2023-03-19 ENCOUNTER — Inpatient Hospital Stay: Payer: Medicare Other | Attending: Physician Assistant | Admitting: Physician Assistant

## 2023-03-19 ENCOUNTER — Encounter: Payer: Self-pay | Admitting: Physician Assistant

## 2023-03-19 DIAGNOSIS — M0609 Rheumatoid arthritis without rheumatoid factor, multiple sites: Secondary | ICD-10-CM | POA: Diagnosis not present

## 2023-03-19 DIAGNOSIS — E538 Deficiency of other specified B group vitamins: Secondary | ICD-10-CM | POA: Diagnosis not present

## 2023-03-19 DIAGNOSIS — M069 Rheumatoid arthritis, unspecified: Secondary | ICD-10-CM

## 2023-03-19 DIAGNOSIS — D509 Iron deficiency anemia, unspecified: Secondary | ICD-10-CM

## 2023-03-19 DIAGNOSIS — Z720 Tobacco use: Secondary | ICD-10-CM | POA: Diagnosis not present

## 2023-03-19 DIAGNOSIS — D508 Other iron deficiency anemias: Secondary | ICD-10-CM

## 2023-04-08 DIAGNOSIS — Z6833 Body mass index (BMI) 33.0-33.9, adult: Secondary | ICD-10-CM | POA: Diagnosis not present

## 2023-04-08 DIAGNOSIS — Z79899 Other long term (current) drug therapy: Secondary | ICD-10-CM | POA: Diagnosis not present

## 2023-04-08 DIAGNOSIS — E119 Type 2 diabetes mellitus without complications: Secondary | ICD-10-CM | POA: Diagnosis not present

## 2023-04-08 DIAGNOSIS — I10 Essential (primary) hypertension: Secondary | ICD-10-CM | POA: Diagnosis not present

## 2023-04-08 DIAGNOSIS — Z6832 Body mass index (BMI) 32.0-32.9, adult: Secondary | ICD-10-CM | POA: Diagnosis not present

## 2023-04-08 DIAGNOSIS — E6609 Other obesity due to excess calories: Secondary | ICD-10-CM | POA: Diagnosis not present

## 2023-04-08 DIAGNOSIS — G894 Chronic pain syndrome: Secondary | ICD-10-CM | POA: Diagnosis not present

## 2023-04-08 DIAGNOSIS — M1990 Unspecified osteoarthritis, unspecified site: Secondary | ICD-10-CM | POA: Diagnosis not present

## 2023-04-08 DIAGNOSIS — M545 Low back pain, unspecified: Secondary | ICD-10-CM | POA: Diagnosis not present

## 2023-04-08 DIAGNOSIS — M069 Rheumatoid arthritis, unspecified: Secondary | ICD-10-CM | POA: Diagnosis not present

## 2023-04-08 DIAGNOSIS — M4310 Spondylolisthesis, site unspecified: Secondary | ICD-10-CM | POA: Diagnosis not present

## 2023-06-10 ENCOUNTER — Other Ambulatory Visit: Payer: Self-pay | Admitting: Family

## 2023-06-10 DIAGNOSIS — E1165 Type 2 diabetes mellitus with hyperglycemia: Secondary | ICD-10-CM

## 2023-06-24 ENCOUNTER — Encounter: Payer: Self-pay | Admitting: Family Medicine

## 2023-06-24 ENCOUNTER — Ambulatory Visit (INDEPENDENT_AMBULATORY_CARE_PROVIDER_SITE_OTHER): Admitting: Family Medicine

## 2023-06-24 VITALS — BP 112/69 | HR 89 | Ht 70.0 in | Wt 222.0 lb

## 2023-06-24 DIAGNOSIS — K121 Other forms of stomatitis: Secondary | ICD-10-CM

## 2023-06-24 MED ORDER — NYSTATIN 100000 UNIT/ML MT SUSP: 5.0000 mL | Freq: Three times a day (TID) | OROMUCOSAL | 1 refills | Status: DC | PRN

## 2023-06-24 NOTE — Progress Notes (Signed)
 BP 112/69   Pulse 89   Ht 5\' 10"  (1.778 m)   Wt 222 lb (100.7 kg)   SpO2 98%   BMI 31.85 kg/m    Subjective:   Patient ID: [name], male    DOB: January 25, 1956, 68 y.o.   MRN: [id number]  HPI: [name] is a 68 y.o. male presenting on 06/24/2023 for Mouth Lesions (Present for about 1 year. Sore when eating.)   HPI Mouth sores Patient is coming in today with some mouth sores, specifically on the left inner cheek but also has some on the right inner cheek and up on his palate.  He feels like he gets more irritated and with eating certain foods and sometimes his dentures hurt.  He actually left his dentures out today to see if that would help.  He has been using the Magic mouthwash that he was given a few months ago he cannot tell if it really helps or not.  Relevant past medical, surgical, family and social history reviewed and updated as indicated. Interim medical history since our last visit reviewed. Allergies and medications reviewed and updated.  Review of Systems  Constitutional:  Negative for chills and fever.  HENT:  Positive for mouth sores.   Eyes:  Negative for visual disturbance.  Respiratory:  Negative for shortness of breath and wheezing.   Cardiovascular:  Negative for chest pain and leg swelling.  Musculoskeletal:  Negative for back pain and gait problem.  Skin:  Negative for rash.  Neurological:  Negative for dizziness.  All other systems reviewed and are negative.   Per HPI unless specifically indicated above   Allergies as of 06/24/2023       Reactions   Shellfish Allergy Shortness Of Breath, Swelling   Leflunomide    LFT ELEVATION   Methotrexate    LFT elevation   Canagliflozin Other (See Comments)   Causes bladder infections   Cephalexin Other (See Comments)   nervous   Doxycycline Nausea And Vomiting   Dulaglutide Diarrhea, Nausea Only        Medication List        Accurate as of June 24, 2023  3:48 PM. If you have any  questions, ask your nurse or doctor.          STOP taking these medications    magic mouthwash w/lidocaine Soln Replaced by: magic mouthwash (nystatin, lidocaine, diphenhydrAMINE, alum & mag hydroxide) suspension Stopped by: Elige Radon Nykiah Ma   Orencia 250 MG injection Generic drug: abatacept Stopped by: Elige Radon Vangie Henthorn       TAKE these medications    aspirin 81 MG chewable tablet Chew 81 mg by mouth daily.   cetirizine 10 MG tablet Commonly known as: ZyrTEC Allergy Take 1 tablet (10 mg total) by mouth daily.   EPINEPHrine 0.3 mg/0.3 mL Soaj injection Commonly known as: EPI-PEN Inject 0.3 mg into the muscle as needed for anaphylaxis.   fluticasone 50 MCG/ACT nasal spray Commonly known as: FLONASE Place 2 sprays into both nostrils daily.   glimepiride 2 MG tablet Commonly known as: AMARYL TAKE 1 TABLET DAILY   hydrochlorothiazide 12.5 MG capsule Commonly known as: MICROZIDE TAKE (1) CAPSULE DAILY   ketoconazole 2 % cream Commonly known as: NIZORAL Apply 1 application. topically daily.   losartan 100 MG tablet Commonly known as: COZAAR TAKE 1 TABLET DAILY   magic mouthwash (nystatin, lidocaine, diphenhydrAMINE, alum & mag hydroxide) suspension Swish and swallow 5 mLs 3 (three) times daily as  needed for mouth pain (Oral sores). Equal parts nystatin and lidocaine and diphenhydramine and Maalox Replaces: magic mouthwash w/lidocaine Soln Started by: Elige Radon Rosaly Labarbera   meloxicam 15 MG tablet Commonly known as: MOBIC TAKE 1 TABLET DAILY   metFORMIN 1000 MG tablet Commonly known as: GLUCOPHAGE TAKE 1 TABLET EVERY MORNING AND 1 & 1/2 TABLETS IN THE EVENING   Multi For Him Tabs Take 1 tablet by mouth daily.   naloxone 4 MG/0.1ML Liqd nasal spray kit Commonly known as: NARCAN Place 1 spray into the nose as needed (opioid overdose).   oxyCODONE-acetaminophen 10-325 MG tablet Commonly known as: Percocet Take 1-2 tablets by mouth every 6 (six) hours as  needed for pain. MAXIMUM TOTAL ACETAMINOPHEN DOSE IS 4000 MG PER DAY   simvastatin 10 MG tablet Commonly known as: ZOCOR TAKE 1 TABLET DAILY   valACYclovir 1000 MG tablet Commonly known as: VALTREX TAKE 1 TABLET 2 TIMES A DAY         Objective:   BP 112/69   Pulse 89   Ht 5\' 10"  (1.778 m)   Wt 222 lb (100.7 kg)   SpO2 98%   BMI 31.85 kg/m   Wt Readings from Last 3 Encounters:  06/24/23 222 lb (100.7 kg)  03/06/23 226 lb 6.4 oz (102.7 kg)  03/01/23 231 lb (104.8 kg)    Physical Exam Vitals and nursing note reviewed.  Constitutional:      Appearance: Normal appearance.  HENT:     Mouth/Throat:     Mouth: Mucous membranes are dry. Oral lesions (0.5 x 0.1 cm oral ulceration on left inner cheek and some smaller ulcer on posterior palate and right inner cheek) present.     Pharynx: Oropharynx is clear. No oropharyngeal exudate or posterior oropharyngeal erythema.  Neurological:     Mental Status: He is alert.       Assessment & Plan:   Problem List Items Addressed This Visit   None Visit Diagnoses       Oral ulcer    -  Primary   Relevant Orders   Ambulatory referral to Oral Maxillofacial Surgery     Do not see signs of thrush, worried with oral ulceration has been there for some time that it could be related to an oral cancer so will send for referral.  Also recommended for him to see his dentist to get his dentures reevaluated.  It could be from a poor fit for dentures.  Follow up plan: Return if symptoms worsen or fail to improve.  Counseling provided for all of the vaccine components Orders Placed This Encounter  Procedures   Ambulatory referral to Oral Maxillofacial Surgery    Arville Care, MD [facility] 06/24/2023, 3:48 PM

## 2023-07-01 ENCOUNTER — Other Ambulatory Visit: Payer: Self-pay | Admitting: Family Medicine

## 2023-07-01 DIAGNOSIS — K121 Other forms of stomatitis: Secondary | ICD-10-CM

## 2023-07-16 ENCOUNTER — Telehealth: Payer: Self-pay | Admitting: Family Medicine

## 2023-07-16 NOTE — Telephone Encounter (Signed)
 Copied from CRM [id number]. Topic: Clinical - Request for Lab/Test Order >> Jul 16, 2023  2:49 PM [name] wrote: Reason for CRM: Patient stated Dr. Talmage Nap sent some blood work orders over today. Patient mentioned its for his diabetes. Please follow up.

## 2023-07-16 NOTE — Telephone Encounter (Signed)
 CALLED AND SPOKE WITH PATIENT HE JUST WANTED A LAB APPT MADE.

## 2023-07-30 ENCOUNTER — Other Ambulatory Visit

## 2023-08-02 ENCOUNTER — Other Ambulatory Visit

## 2023-08-06 ENCOUNTER — Encounter: Payer: Self-pay | Admitting: Family

## 2023-08-06 ENCOUNTER — Telehealth: Payer: Self-pay

## 2023-08-06 ENCOUNTER — Ambulatory Visit (INDEPENDENT_AMBULATORY_CARE_PROVIDER_SITE_OTHER): Payer: Medicare Other | Admitting: Family

## 2023-08-06 VITALS — BP 133/83 | HR 58 | Temp 97.3°F | Ht 70.0 in | Wt 221.4 lb

## 2023-08-06 DIAGNOSIS — E1165 Type 2 diabetes mellitus with hyperglycemia: Secondary | ICD-10-CM

## 2023-08-06 DIAGNOSIS — E669 Obesity, unspecified: Secondary | ICD-10-CM

## 2023-08-06 DIAGNOSIS — E1159 Type 2 diabetes mellitus with other circulatory complications: Secondary | ICD-10-CM

## 2023-08-06 DIAGNOSIS — Z6831 Body mass index (BMI) 31.0-31.9, adult: Secondary | ICD-10-CM

## 2023-08-06 DIAGNOSIS — Z79899 Other long term (current) drug therapy: Secondary | ICD-10-CM

## 2023-08-06 DIAGNOSIS — M059 Rheumatoid arthritis with rheumatoid factor, unspecified: Secondary | ICD-10-CM

## 2023-08-06 DIAGNOSIS — E1169 Type 2 diabetes mellitus with other specified complication: Secondary | ICD-10-CM

## 2023-08-06 DIAGNOSIS — E785 Hyperlipidemia, unspecified: Secondary | ICD-10-CM

## 2023-08-06 DIAGNOSIS — D509 Iron deficiency anemia, unspecified: Secondary | ICD-10-CM

## 2023-08-06 DIAGNOSIS — I152 Hypertension secondary to endocrine disorders: Secondary | ICD-10-CM

## 2023-08-06 LAB — BAYER DCA HB A1C WAIVED: HB A1C (BAYER DCA - WAIVED): 6.4 % — ABNORMAL HIGH (ref 4.8–5.6)

## 2023-08-06 LAB — LIPID PANEL

## 2023-08-06 MED ORDER — LOSARTAN POTASSIUM 100 MG PO TABS: 100.0000 mg | ORAL_TABLET | Freq: Every day | ORAL | 1 refills | Status: DC

## 2023-08-06 MED ORDER — DAPAGLIFLOZIN PROPANEDIOL 10 MG PO TABS: 10.0000 mg | ORAL_TABLET | Freq: Every day | ORAL | 1 refills | Status: DC

## 2023-08-06 MED ORDER — GLIMEPIRIDE 2 MG PO TABS: 2.0000 mg | ORAL_TABLET | Freq: Every day | ORAL | 2 refills | Status: DC

## 2023-08-06 MED ORDER — HYDROCHLOROTHIAZIDE 12.5 MG PO CAPS: ORAL_CAPSULE | ORAL | 1 refills | Status: DC

## 2023-08-06 MED ORDER — EMPAGLIFLOZIN 25 MG PO TABS: 25.0000 mg | ORAL_TABLET | Freq: Every day | ORAL | 1 refills | Status: DC

## 2023-08-06 NOTE — Telephone Encounter (Signed)
 Copied from CRM [id number]. Topic: Clinical - Medication Question >> Aug 06, 2023 11:09 AM Phil Braun wrote: Reason for CRM:   Ref: dapagliflozin propanediol (FARXIGA) 10 MG TABS tablet  Pt called and said that this medication is over 500.00 could the dr find something cheaper that I can take instead.   Please give pt a call.

## 2023-08-06 NOTE — Telephone Encounter (Signed)
Left detailed message per dpr  

## 2023-08-06 NOTE — Telephone Encounter (Signed)
 Farxiga changed to Jardiance . Let me know if still too expensive.

## 2023-08-06 NOTE — Progress Notes (Signed)
 Subjective:    Patient ID: [name], male    DOB: February 19, 1956, 68 y.o.   MRN: [id number]  Chief Complaint  Patient presents with   Medical Management of Chronic Issues    Discuss coming off metformin    PT presnets to the office today for chronic follow up.   Pt has rheumatoid arthritis and followed by rheumatologists every 6 months. Pt is followed by Cardiologists annually. He is followed by Hematologists for iron deficiency.     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    He is obese with a BMI of 31 and DM and HTN.  He wants to discussed d/c metformin . Reports he was taking 1000 mg in AM and 1500 mg at night, he has decreased to just a 1000 mg at night. His fasting glucose 112.   Hypertension This is a chronic problem. The current episode started more than 1 year ago. The problem has been resolved since onset. The problem is controlled. Pertinent negatives include no blurred vision, malaise/fatigue, peripheral edema or shortness of breath. Risk factors for coronary artery disease include dyslipidemia, diabetes mellitus and male gender. The current treatment provides moderate improvement.  Hyperlipidemia This is a chronic problem. The current episode started more than 1 year ago. The problem is controlled. Recent lipid tests were reviewed and are normal. Exacerbating diseases include obesity. Pertinent negatives include no shortness of breath. Current antihyperlipidemic treatment includes statins. The current treatment provides moderate improvement of lipids. Risk factors for coronary artery disease include dyslipidemia, diabetes mellitus, hypertension, male sex and a sedentary lifestyle.  Diabetes He presents for his follow-up diabetic visit. He has type 2 diabetes mellitus. Pertinent negatives for diabetes include no blurred vision and no foot paresthesias. Symptoms are stable. Risk factors for coronary artery disease include dyslipidemia, diabetes mellitus, hypertension and  sedentary lifestyle. He is following a generally healthy diet. His overall blood glucose range is 110-130 mg/dl. Eye exam is current.  Arthritis Presents for follow-up visit. He complains of pain and stiffness. Affected locations include the right knee, left knee, right shoulder, left shoulder, left MCP and right MCP. His pain is at a severity of 5/10.  Anemia Presents for follow-up visit. There has been no malaise/fatigue.      Review of Systems  Constitutional:  Negative for malaise/fatigue.  Eyes:  Negative for blurred vision.  Respiratory:  Negative for shortness of breath.   Musculoskeletal:  Positive for stiffness.  All other systems reviewed and are negative.  Family History  Problem Relation Age of Onset   Diabetes Father    Hypertension Father    Heart Problems Father    Stroke Father    Arthritis Sister    Arthritis Sister    Diabetes Maternal Grandmother    Arthritis Daughter    Colon cancer Neg Hx    Social History   Socioeconomic History   Marital status: Married    Spouse name: Ammon Bales   Number of children: 2   Years of education: Not on file   Highest education level: Not on file  Occupational History   Occupation: Disabled  Tobacco Use   Smoking status: Former    Current packs/day: 0.00    Types: Cigarettes    Quit date: 03/05/1991    Years since quitting: 32.4   Smokeless tobacco: Never  Vaping Use   Vaping status: Never Used  Substance and Sexual Activity   Alcohol use: Yes    Comment: rare   Drug use: No  Sexual activity: Not on file  Other Topics Concern   Not on file  Social History Narrative   Children live in [place]   Social Drivers of Health   Financial Resource Strain: Low Risk  (10/31/2022)   Overall Financial Resource Strain (CARDIA)    Difficulty of Paying Living Expenses: Not hard at all  Food Insecurity: No Food Insecurity (10/31/2022)   Hunger Vital Sign    Worried About Running Out of Food in the Last Year: Never true    Ran  Out of Food in the Last Year: Never true  Transportation Needs: No Transportation Needs (10/31/2022)   PRAPARE - Administrator, Civil Service (Medical): No    Lack of Transportation (Non-Medical): No  Physical Activity: Inactive (10/31/2022)   Exercise Vital Sign    Days of Exercise per Week: 0 days    Minutes of Exercise per Session: 0 min  Stress: No Stress Concern Present (10/31/2022)   [employer] of Occupational Health - Occupational Stress Questionnaire    Feeling of Stress : Not at all  Social Connections: Socially Integrated (10/31/2022)   Social Connection and Isolation Panel [NHANES]    Frequency of Communication with Friends and Family: More than three times a week    Frequency of Social Gatherings with Friends and Family: More than three times a week    Attends Religious Services: More than 4 times per year    Active Member of [employer] or Organizations: Yes    Attends Engineer, structural: More than 4 times per year    Marital Status: Married       Objective:   Physical Exam Vitals reviewed.  Constitutional:      General: He is not in acute distress.    Appearance: He is well-developed. He is obese.  HENT:     Head: Normocephalic.     Right Ear: Tympanic membrane normal.     Left Ear: Tympanic membrane normal.  Eyes:     General:        Right eye: No discharge.        Left eye: No discharge.     Pupils: Pupils are equal, round, and reactive to light.  Neck:     Thyroid : No thyromegaly.  Cardiovascular:     Rate and Rhythm: Normal rate and regular rhythm.     Heart sounds: Normal heart sounds. No murmur heard. Pulmonary:     Effort: Pulmonary effort is normal. No respiratory distress.     Breath sounds: Normal breath sounds. No wheezing.  Abdominal:     General: Bowel sounds are normal. There is no distension.     Palpations: Abdomen is soft.     Tenderness: There is no abdominal tenderness.  Musculoskeletal:        General: No  tenderness. Normal range of motion.     Cervical back: Normal range of motion and neck supple.     Comments: Decrease ROM of neck, shoulders  Skin:    General: Skin is warm and dry.     Findings: No erythema or rash.  Neurological:     Mental Status: He is alert and oriented to person, place, and time.     Cranial Nerves: No cranial nerve deficit.     Deep Tendon Reflexes: Reflexes are normal and symmetric.  Psychiatric:        Behavior: Behavior normal.        Thought Content: Thought content normal.  Judgment: Judgment normal.       BP 133/83   Pulse (!) 58   Temp (!) 97.3 F (36.3 C) (Temporal)   Ht 5\' 10"  (1.778 m)   Wt 221 lb 6.4 oz (100.4 kg)   SpO2 98%   BMI 31.77 kg/m      Assessment & Plan:   VERAL CIMO comes in today with chief complaint of Medical Management of Chronic Issues (Discuss coming off metformin )   Diagnosis and orders addressed:  1. Type 2 diabetes mellitus with hyperglycemia, without long-term current use of insulin (HCC) (Primary) Will stop metformin  (pt taking 1000 mg at bed time), and add Farxiga 10 mg. Discussed risk of yeast infection.  Low carb diet  Follow up in 2 months  - glimepiride  (AMARYL ) 2 MG tablet; Take 1 tablet (2 mg total) by mouth daily.  Dispense: 90 tablet; Refill: 2 - dapagliflozin propanediol (FARXIGA) 10 MG TABS tablet; Take 1 tablet (10 mg total) by mouth daily before breakfast.  Dispense: 90 tablet; Refill: 1 - CMP14+EGFR - Bayer DCA Hb A1c Waived  2. Hypertension associated with diabetes (HCC) - hydrochlorothiazide  (MICROZIDE ) 12.5 MG capsule; TAKE (1) CAPSULE DAILY  Dispense: 90 capsule; Refill: 1 - losartan  (COZAAR ) 100 MG tablet; Take 1 tablet (100 mg total) by mouth daily.  Dispense: 90 tablet; Refill: 1 - CMP14+EGFR  3. Hyperlipidemia associated with type 2 diabetes mellitus (HCC) - CMP14+EGFR - Lipid panel  4. Rheumatoid arthritis with positive rheumatoid factor, involving unspecified site  (HCC) - CMP14+EGFR  5. Obesity (BMI 30-39.9) - CMP14+EGFR  6. Iron deficiency anemia, unspecified iron deficiency anemia type  - CMP14+EGFR  7. Other long term (current) drug therapy - CMP14+EGFR     Labs pending Will stop metformin  (pt taking 1000 mg at bed time), and add Farxiga 10 mg. Discussed risk of yeast infection.  Health Maintenance reviewed Diet and exercise encouraged  Follow up plan: 2 months for DM  Tommas Fragmin, FNP

## 2023-08-06 NOTE — Patient Instructions (Signed)

## 2023-08-07 LAB — CMP14+EGFR
ALT: 25 IU/L (ref 0–44)
AST: 23 IU/L (ref 0–40)
Albumin: 4.3 g/dL (ref 3.9–4.9)
Alkaline Phosphatase: 70 IU/L (ref 44–121)
BUN/Creatinine Ratio: 16 (ref 10–24)
BUN: 21 mg/dL (ref 8–27)
Bilirubin Total: 0.4 mg/dL (ref 0.0–1.2)
CO2: 23 mmol/L (ref 20–29)
Calcium: 9.3 mg/dL (ref 8.6–10.2)
Chloride: 100 mmol/L (ref 96–106)
Creatinine, Ser: 1.29 mg/dL — ABNORMAL HIGH (ref 0.76–1.27)
Globulin, Total: 2.2 g/dL (ref 1.5–4.5)
Glucose: 119 mg/dL — ABNORMAL HIGH (ref 70–99)
Potassium: 4.7 mmol/L (ref 3.5–5.2)
Sodium: 139 mmol/L (ref 134–144)
Total Protein: 6.5 g/dL (ref 6.0–8.5)
eGFR: 60 mL/min/{1.73_m2} (ref >59)

## 2023-08-07 LAB — LIPID PANEL
Cholesterol, Total: 117 mg/dL (ref 100–199)
HDL: 30 mg/dL — ABNORMAL LOW (ref >39)
LDL CALC COMMENT:: 3.9 ratio (ref 0.0–5.0)
LDL Chol Calc (NIH): 72 mg/dL (ref 0–99)
Triglycerides: 74 mg/dL (ref 0–149)
VLDL Cholesterol Cal: 15 mg/dL (ref 5–40)

## 2023-08-12 ENCOUNTER — Other Ambulatory Visit: Payer: Self-pay | Admitting: Family

## 2023-08-12 DIAGNOSIS — J301 Allergic rhinitis due to pollen: Secondary | ICD-10-CM

## 2023-09-03 ENCOUNTER — Telehealth: Payer: Self-pay

## 2023-09-03 DIAGNOSIS — E1165 Type 2 diabetes mellitus with hyperglycemia: Secondary | ICD-10-CM

## 2023-09-03 NOTE — Addendum Note (Signed)
 Addended by: Tommas Fragmin A on: 09/03/2023 04:35 PM   Modules accepted: Orders

## 2023-09-03 NOTE — Telephone Encounter (Signed)
 CMP ordered.

## 2023-09-03 NOTE — Telephone Encounter (Signed)
 Copied from CRM [id number]. Topic: Clinical - Request for Lab/Test Order >> Sep 03, 2023  1:01 PM Zipporah Him wrote: Reason for CRM: Patient called to schedule labs for 1 week before his appointment with Tommas Fragmin (around 6/3), said orders should have been placed already but did not see them in his chart, only in appointment notes.

## 2023-09-03 NOTE — Telephone Encounter (Signed)
 What orders would you like placed?

## 2023-09-10 ENCOUNTER — Other Ambulatory Visit: Payer: Self-pay | Admitting: Family

## 2023-09-10 ENCOUNTER — Other Ambulatory Visit

## 2023-09-10 DIAGNOSIS — D508 Other iron deficiency anemias: Secondary | ICD-10-CM

## 2023-09-10 DIAGNOSIS — E538 Deficiency of other specified B group vitamins: Secondary | ICD-10-CM

## 2023-09-11 LAB — CBC WITH DIFFERENTIAL/PLATELET
Basophils Absolute: 0.1 10*3/uL (ref 0.0–0.2)
Basos: 1 %
EOS (ABSOLUTE): 0.4 10*3/uL (ref 0.0–0.4)
Eos: 5 %
Hematocrit: 40.8 % (ref 37.5–51.0)
Hemoglobin: 13.9 g/dL (ref 13.0–17.7)
Immature Grans (Abs): 0 10*3/uL (ref 0.0–0.1)
Immature Granulocytes: 0 %
Lymphocytes Absolute: 2.2 10*3/uL (ref 0.7–3.1)
Lymphs: 29 %
MCH: 32.1 pg (ref 26.6–33.0)
MCHC: 34.1 g/dL (ref 31.5–35.7)
MCV: 94 fL (ref 79–97)
Monocytes Absolute: 0.6 10*3/uL (ref 0.1–0.9)
Monocytes: 7 %
Neutrophils Absolute: 4.5 10*3/uL (ref 1.4–7.0)
Neutrophils: 58 %
Platelets: 250 10*3/uL (ref 150–450)
RBC: 4.33 x10E6/uL (ref 4.14–5.80)
RDW: 12.6 % (ref 11.6–15.4)
WBC: 7.8 10*3/uL (ref 3.4–10.8)

## 2023-09-11 LAB — FERRITIN: Ferritin: 96 ng/mL (ref 30–400)

## 2023-09-11 LAB — IRON AND TIBC
Iron Saturation: 45 % (ref 15–55)
Iron: 144 ug/dL (ref 38–169)
Total Iron Binding Capacity: 321 ug/dL (ref 250–450)
UIBC: 177 ug/dL (ref 111–343)

## 2023-09-12 LAB — METHYLMALONIC ACID, SERUM: Methylmalonic Acid: 117 nmol/L (ref 0–378)

## 2023-09-12 LAB — VITAMIN B12: Vitamin B-12: 810 pg/mL (ref 232–1245)

## 2023-09-16 NOTE — Progress Notes (Unsigned)
 [facility] [address], [place] [address]   CLINIC:  Medical Oncology/Hematology  PCP:  Yevette Hem, FNP [address] [place] [address] [phone]   REASON FOR VISIT:  Follow-up for iron deficiency and B12 deficiency   PRIOR THERAPY: Intermittent IV iron   CURRENT THERAPY: Oral iron tablets and B12 tablets  INTERVAL HISTORY:  [name] is seen in clinic today for follow-up of iron deficiency anemia.  He was last evaluated via telemedicine visit by [name] on 03/19/2023.  At today's visit, he reports feeling fairly well.  No recent hospitalizations, surgeries, or changes in baseline health status.   He denies any fatigue and reports that he has good energy.   He is taking iron tablet daily. He takes B12 tablet "when he remembers it," about once every other week. He denies any bleeding such as epistaxis, hematochezia, or melena. He denies any pica, headaches, chest pain, dyspnea on exertion, lightheadedness, or syncope. He is no longer on Orencia , and has switched Rinvoq for his rheumatoid arthritis.    He reports 75% energy and 100% appetite.  He reports that he is maintaining a stable weight.    ASSESSMENT & PLAN:  1.  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 - No indication for IV iron. - Recommend that  he continue oral iron supplementation to maintain ferritin >100. - He has not required IV iron for the past 2+ years.  Since he is asymptomatic, without anemia, and improving on iron supplementation recommend DISCHARGE TO PCP at this time.  If he requires IV iron infusions in the future, he can be referred back to us  by PCP.  2.  B12 deficiency: - Patient is taking vitamin B12 cyanocobalamin  1000 mcg tablet "when he remembers it," about once every other week - Most recent labs (09/10/2023): Normal vitamin B12 at 810, normal methylmalonic acid - PLAN: Continue twice weekly B12 tablet.  Will defer ongoing management to PCP.     3.  Rheumatoid arthritis: - His arthritis involves small joints of the hands and right shoulder.   - He is on Orencia  every 4 weeks.    4.  Tobacco abuse: - He quit smoking in 1992.  Smoked 1.5 pack/day for 20 years. - He is not eligible for LDCT (quit > 15 years ago)  PLAN SUMMARY:  Discharge to PCP     REVIEW OF SYSTEMS:   Review of Systems  Constitutional:  Negative for appetite change, chills, diaphoresis, fatigue, fever and unexpected weight change.  HENT:   Negative for lump/mass and nosebleeds.   Eyes:  Negative for eye problems.  Respiratory:  Negative for cough, hemoptysis and shortness of breath.   Cardiovascular:  Negative for chest pain, leg swelling and palpitations.  Gastrointestinal:  Negative for abdominal pain, blood in stool, constipation, diarrhea, nausea and vomiting.  Genitourinary:  Negative for hematuria.   Musculoskeletal:  Positive for back pain.  Skin: Negative.   Neurological:  Positive for dizziness. Negative for headaches and light-headedness.  Hematological:  Does not bruise/bleed easily.     PHYSICAL EXAM:  ECOG PERFORMANCE STATUS: 0 - Asymptomatic  Vitals:   09/17/23 1310 09/17/23 1315  BP: (!) 143/73 124/74  Pulse: 73   Resp: 20   Temp: 97.6 F (36.4 C)   SpO2: 95%    Filed Weights   09/17/23 1310  Weight: 225 lb 1.4  oz (102.1 kg)   Physical Exam Constitutional:      Appearance: Normal appearance. He is obese.  Cardiovascular:     Heart sounds: Normal heart sounds.  Pulmonary:     Breath sounds: Normal breath sounds.  Neurological:     General: No focal deficit present.     Mental Status: Mental status is at baseline.  Psychiatric:        Behavior: Behavior normal. Behavior is cooperative.     PAST MEDICAL/SURGICAL HISTORY:  Past Medical History:  Diagnosis Date   Anemia    taking iron   Arthritis    rheumatoid arthritis   Diabetes mellitus    Heart murmur    Hyperlipidemia    Hypertension    Primary localized osteoarthritis of right knee 05/13/2015   Scapho-lunate dissociation, right    Vitamin D  deficiency    Past Surgical History:  Procedure Laterality Date   CARDIAC CATHETERIZATION  2005   normal   CARPAL TUNNEL RELEASE  2009   both hands   COLONOSCOPY WITH PROPOFOL  N/A 04/17/2021   Procedure: COLONOSCOPY WITH PROPOFOL ;  Surgeon: Vinetta Greening, DO;  [facility];  Service: Endoscopy;  Laterality: N/A;  8:15am, ASA 2   HARDWARE REMOVAL Right 11/14/2017   Procedure: REMOVAL ACCUTRAC SCREW RIGHT WRIST;  Surgeon: Lyanne Sample, MD;  Location: [facility];  Service: Orthopedics;  Laterality: Right;   KNEE ARTHROSCOPY     rt   PARTIAL KNEE ARTHROPLASTY Right 05/13/2015   Procedure: RIGHT UNI KNEE ARTHROPLASTY;  Surgeon: Osa Blase, MD;  Location: [facility];  Service: Orthopedics;  Laterality: Right;   POLYPECTOMY  04/17/2021   Procedure: POLYPECTOMY;  Surgeon: Vinetta Greening, DO;  Location: [facility];  Service: Endoscopy;;   scaphoid excision Left 2012   Dr Lorena Rolling   TONSILLECTOMY     as a child   TOTAL SHOULDER ARTHROPLASTY Right 08/23/2020   Procedure: TOTAL SHOULDER ARTHROPLASTY;  Surgeon: Osa Blase, MD;  Location: [facility];  Service: Orthopedics;  Laterality: Right;   WRIST FUSION Right 03/14/2017   Procedure: SCAPHOID EXCISION  ULNAR FOUR BONE FUSION RIGHT;  Surgeon: Lyanne Sample, MD;  Location: [facility];  Service: Orthopedics;  Laterality: Right;    SOCIAL HISTORY:  Social History   Socioeconomic History   Marital status: Married    Spouse name: Ammon Bales   Number of children: 2   Years of education: Not on file   Highest education level: Not on file  Occupational History   Occupation: Disabled  Tobacco Use   Smoking status: Former    Current packs/day: 0.00    Types: Cigarettes    Quit date: 03/05/1991    Years since quitting: 32.5   Smokeless tobacco: Never  Vaping Use   Vaping status: Never Used  Substance and Sexual Activity   Alcohol use: Yes    Comment: rare   Drug use: No   Sexual activity: Not on file  Other Topics Concern   Not on file  Social History Narrative   Children live in [place]   Social Drivers of Health   Financial Resource Strain: Low Risk  (10/31/2022)   Overall Financial Resource Strain (CARDIA)    Difficulty of Paying Living Expenses: Not hard at all  Food Insecurity: No Food Insecurity (10/31/2022)   Hunger Vital Sign    Worried About Running Out of Food in the Last Year: Never true    Ran Out of Food in the Last Year: Never true  Transportation Needs: No Transportation Needs (10/31/2022)   PRAPARE - Administrator, Civil Service (Medical): No    Lack of Transportation (Non-Medical): No  Physical Activity: Inactive (10/31/2022)   Exercise Vital Sign    Days of Exercise per Week: 0 days    Minutes of Exercise per Session: 0 min  Stress: No Stress Concern Present (10/31/2022)   [employer] of Occupational Health - Occupational Stress Questionnaire    Feeling of Stress : Not at all  Social Connections: Socially Integrated (10/31/2022)   Social Connection and Isolation Panel [NHANES]    Frequency of Communication with Friends and Family: More than three times a week    Frequency of Social Gatherings with Friends and Family: More than three  times a week    Attends Religious Services: More than 4 times per year    Active Member of [employer] or Organizations: Yes    Attends Engineer, structural: More than 4 times per year    Marital Status: Married  Catering manager Violence: Not At Risk (10/31/2022)   Humiliation, Afraid, Rape, and Kick questionnaire    Fear of Current or Ex-Partner: No    Emotionally Abused: No    Physically Abused: No    Sexually Abused: No    FAMILY HISTORY:  Family History  Problem Relation Age of Onset   Diabetes Father    Hypertension Father    Heart Problems Father    Stroke Father    Arthritis Sister    Arthritis Sister    Diabetes Maternal Grandmother    Arthritis Daughter    Colon cancer Neg Hx     CURRENT MEDICATIONS:  Outpatient Encounter Medications as of 09/17/2023  Medication Sig   aspirin 81 MG chewable tablet Chew 81 mg by mouth daily.   cetirizine  (ZYRTEC ) 10 MG tablet TAKE ONE TABLET DAILY   empagliflozin  (JARDIANCE ) 25 MG TABS tablet Take 1 tablet (25 mg total) by mouth daily before breakfast.   EPINEPHrine  0.3 mg/0.3 mL IJ SOAJ injection Inject 0.3 mg into the muscle as needed for anaphylaxis.   fluticasone  (FLONASE ) 50 MCG/ACT nasal spray Place 2 sprays into both nostrils daily.   glimepiride  (AMARYL ) 2 MG tablet Take 1 tablet (2 mg total) by mouth daily.   hydrochlorothiazide  (MICROZIDE ) 12.5 MG capsule TAKE (1) CAPSULE DAILY   ketoconazole  (NIZORAL ) 2 % cream Apply 1 application. topically daily.   losartan  (COZAAR ) 100 MG tablet Take 1 tablet (100 mg total) by mouth daily.   magic mouthwash (nystatin , lidocaine , diphenhydrAMINE , alum & mag hydroxide) suspension Swish and swallow 5 mLs 3 (three) times daily as needed for mouth pain (Oral sores). Equal parts nystatin  and lidocaine  and diphenhydramine  and Maalox   meloxicam  (MOBIC ) 15 MG tablet TAKE 1 TABLET DAILY   metFORMIN  (GLUCOPHAGE ) 500 MG tablet one tab po once a day   Multiple Vitamins-Minerals (MULTI FOR HIM)  TABS Take 1 tablet by mouth daily.   naloxone (NARCAN) nasal spray 4 mg/0.1 mL Place 1 spray  into the nose as needed (opioid overdose).   oxyCODONE -acetaminophen  (PERCOCET) 10-325 MG tablet Take 1-2 tablets by mouth every 6 (six) hours as needed for pain. MAXIMUM TOTAL ACETAMINOPHEN  DOSE IS 4000 MG PER DAY   simvastatin  (ZOCOR ) 10 MG tablet TAKE 1 TABLET DAILY   Upadacitinib ER (RINVOQ) 15 MG TB24 1 tablet Orally Once a day   valACYclovir  (VALTREX ) 1000 MG tablet TAKE 1 TABLET 2 TIMES A DAY   No facility-administered encounter medications on file as of 09/17/2023.    ALLERGIES:  Allergies  Allergen Reactions   Shellfish Allergy Shortness Of Breath and Swelling   Leflunomide     LFT ELEVATION   Methotrexate     LFT elevation   Canagliflozin  Other (See Comments)    Causes bladder infections    Cephalexin  Other (See Comments)    nervous    Doxycycline  Nausea And Vomiting   Dulaglutide  Diarrhea and Nausea Only    LABORATORY DATA:  I have reviewed the labs as listed.  CBC    Component Value Date/Time   WBC 7.8 09/10/2023 0911   WBC 8.5 09/29/2021 1025   RBC 4.33 09/10/2023 0911   RBC 4.61 09/29/2021 1025   HGB 13.9 09/10/2023 0911   HCT 40.8 09/10/2023 0911   PLT 250 09/10/2023 0911   MCV 94 09/10/2023 0911   MCH 32.1 09/10/2023 0911   MCH 32.1 09/29/2021 1025   MCHC 34.1 09/10/2023 0911   MCHC 34.6 09/29/2021 1025   RDW 12.6 09/10/2023 0911   LYMPHSABS 2.2 09/10/2023 0911   MONOABS 0.4 09/29/2021 1025   EOSABS 0.4 09/10/2023 0911   BASOSABS 0.1 09/10/2023 0911      Latest Ref Rng & Units 08/06/2023    8:55 AM 02/08/2023   10:50 AM 09/07/2022    8:49 AM  CMP  Glucose 70 - 99 mg/dL 962  952  841   BUN 8 - 27 mg/dL 21  15  15    Creatinine 0.76 - 1.27 mg/dL 3.24  4.01  0.27   Sodium 134 - 144 mmol/L 139  140  141   Potassium 3.5 - 5.2 mmol/L 4.7  4.4  4.7   Chloride 96 - 106 mmol/L 100  101  101   CO2 20 - 29 mmol/L 23  21  27    Calcium 8.6 - 10.2 mg/dL 9.3  9.5   9.2   Total Protein 6.0 - 8.5 g/dL 6.5  6.5  6.5   Total Bilirubin 0.0 - 1.2 mg/dL 0.4  0.3  0.2   Alkaline Phos 44 - 121 IU/L 70  74  67   AST 0 - 40 IU/L 23  22  20    ALT 0 - 44 IU/L 25  20  24      DIAGNOSTIC IMAGING:  I have independently reviewed the relevant imaging and discussed with the patient.   WRAP UP:  All questions were answered. The patient knows to call the clinic with any problems, questions or concerns.  Medical decision making: Low  Time spent on visit: I spent 15 minutes counseling the patient face to face. The total time spent in the appointment was 22 minutes and more than 50% was on counseling.  Sonnie Dusky, PA-C  09/17/23 1:47 PM

## 2023-09-17 ENCOUNTER — Inpatient Hospital Stay: Payer: Medicare Other | Attending: Physician Assistant | Admitting: Physician Assistant

## 2023-09-17 VITALS — BP 124/74 | HR 73 | Temp 97.6°F | Resp 20 | Wt 225.1 lb

## 2023-09-17 DIAGNOSIS — Z87891 Personal history of nicotine dependence: Secondary | ICD-10-CM | POA: Diagnosis not present

## 2023-09-17 DIAGNOSIS — E538 Deficiency of other specified B group vitamins: Secondary | ICD-10-CM | POA: Diagnosis not present

## 2023-09-17 DIAGNOSIS — M069 Rheumatoid arthritis, unspecified: Secondary | ICD-10-CM | POA: Diagnosis not present

## 2023-09-17 DIAGNOSIS — D509 Iron deficiency anemia, unspecified: Secondary | ICD-10-CM | POA: Diagnosis present

## 2023-09-17 DIAGNOSIS — D508 Other iron deficiency anemias: Secondary | ICD-10-CM | POA: Diagnosis not present

## 2023-09-17 NOTE — Patient Instructions (Signed)
 [facility] at [facility] **VISIT SUMMARY & IMPORTANT INSTRUCTIONS **   You were seen today by [name] for your iron deficiency and vitamin B12 deficiency.   Your iron and B12 levels currently look great! You have not needed any IV iron since September 2022, and your levels are stable and improving on oral supplementation alone.  Therefore, we recommend that you be discharged from our clinic.  You should continue to follow-up with your primary care provider, with the following recommendations: Check B12 and iron levels at least once a year.  Try to maintain ferritin >100. Continue daily iron tablet Continue vitamin B12 twice weekly If you have any severe iron deficiency or anemia in the future and require IV iron infusions, your primary care provider can refer you back to our clinic as needed.  ** Thank you for trusting me with your healthcare!  I strive to provide all of my patients with quality care at each visit.  If you receive a survey for this visit, I would be so grateful to you for taking the time to provide feedback.  Thank you in advance!  ~ Zanai Mallari                   Dr. Paulett Boros   &   Sheril Dines, PA-C   - - - - - - - - - - - - - - - - - -    Thank you for choosing [facility] at [facility] to provide your oncology and hematology care.  To afford each patient quality time with our provider, please arrive at least 15 minutes before your scheduled appointment time.   If you have a lab appointment with the Cancer Center please come in thru the Main Entrance and check in at the main information desk.  You need to re-schedule your appointment should you arrive 10 or more minutes late.  We strive to give you quality time with our providers, and arriving late affects you and other patients whose appointments are after yours.  Also, if you no show three or more times for appointments you may be dismissed from the  clinic at the providers discretion.     Again, thank you for choosing [facility].  Our hope is that these requests will decrease the amount of time that you wait before being seen by our physicians.       _____________________________________________________________  Should you have questions after your visit to [facility], please contact our office at [phone] and follow the prompts.  Our office hours are 8:00 a.m. and 4:30 p.m. Monday - Friday.  Please note that voicemails left after 4:00 p.m. may not be returned until the following business day.  We are closed weekends and major holidays.  You do have access to a nurse 24-7, just call the main number to the clinic [phone] and do not press any options, hold on the line and a nurse will answer the phone.    For prescription refill requests, have your pharmacy contact our office and allow 72 hours.

## 2023-10-07 ENCOUNTER — Ambulatory Visit: Admitting: Family

## 2023-10-08 ENCOUNTER — Ambulatory Visit (INDEPENDENT_AMBULATORY_CARE_PROVIDER_SITE_OTHER): Admitting: Family

## 2023-10-08 ENCOUNTER — Encounter (HOSPITAL_COMMUNITY): Payer: Self-pay | Admitting: Hematology

## 2023-10-08 ENCOUNTER — Encounter: Payer: Self-pay | Admitting: Family

## 2023-10-08 VITALS — BP 136/75 | HR 59 | Temp 98.4°F | Ht 70.0 in | Wt 224.2 lb

## 2023-10-08 DIAGNOSIS — E1165 Type 2 diabetes mellitus with hyperglycemia: Secondary | ICD-10-CM

## 2023-10-08 DIAGNOSIS — Z7984 Long term (current) use of oral hypoglycemic drugs: Secondary | ICD-10-CM | POA: Diagnosis not present

## 2023-10-08 DIAGNOSIS — E785 Hyperlipidemia, unspecified: Secondary | ICD-10-CM | POA: Diagnosis not present

## 2023-10-08 DIAGNOSIS — E1169 Type 2 diabetes mellitus with other specified complication: Secondary | ICD-10-CM | POA: Diagnosis not present

## 2023-10-08 DIAGNOSIS — E1159 Type 2 diabetes mellitus with other circulatory complications: Secondary | ICD-10-CM

## 2023-10-08 DIAGNOSIS — I152 Hypertension secondary to endocrine disorders: Secondary | ICD-10-CM | POA: Diagnosis not present

## 2023-10-08 DIAGNOSIS — Z79899 Other long term (current) drug therapy: Secondary | ICD-10-CM

## 2023-10-08 DIAGNOSIS — E669 Obesity, unspecified: Secondary | ICD-10-CM

## 2023-10-08 DIAGNOSIS — M059 Rheumatoid arthritis with rheumatoid factor, unspecified: Secondary | ICD-10-CM | POA: Diagnosis not present

## 2023-10-08 DIAGNOSIS — M1711 Unilateral primary osteoarthritis, right knee: Secondary | ICD-10-CM

## 2023-10-08 MED ORDER — METFORMIN HCL ER 500 MG PO TB24: 500.0000 mg | ORAL_TABLET | Freq: Every day | ORAL | 1 refills | Status: DC

## 2023-10-08 NOTE — Progress Notes (Signed)
 Subjective:    Patient ID: [name], male    DOB: February 26, 1956, 68 y.o.   MRN: [id number]  Chief Complaint  Patient presents with   Diabetes   PT presnets to the office today for chronic follow up.   Last visit we stopped his metformin  and added Farxiga  10 mg. Reports this rx was too expensive and never started it. So he has been taking metformin  1000 mg at bedtime.   Pt has rheumatoid arthritis and followed by rheumatologists every 6 months. Pt is followed by Cardiologists annually. He is followed by Hematologists for iron deficiency.     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    He is obese with a BMI of 32 and DM and HTN.  Hypertension This is a chronic problem. The current episode started more than 1 year ago. The problem has been resolved since onset. The problem is controlled. Pertinent negatives include no blurred vision, malaise/fatigue, peripheral edema or shortness of breath. Risk factors for coronary artery disease include dyslipidemia, diabetes mellitus and male gender. The current treatment provides moderate improvement.  Hyperlipidemia This is a chronic problem. The current episode started more than 1 year ago. The problem is controlled. Recent lipid tests were reviewed and are normal. Exacerbating diseases include obesity. Pertinent negatives include no shortness of breath. Current antihyperlipidemic treatment includes statins. The current treatment provides moderate improvement of lipids. Risk factors for coronary artery disease include dyslipidemia, diabetes mellitus, hypertension, male sex and a sedentary lifestyle.  Diabetes He presents for his follow-up diabetic visit. He has type 2 diabetes mellitus. Pertinent negatives for diabetes include no blurred vision and no foot paresthesias. Symptoms are stable. Risk factors for coronary artery disease include dyslipidemia, diabetes mellitus, hypertension and sedentary lifestyle. He is following a generally healthy  diet. His overall blood glucose range is 90-110 mg/dl. Eye exam is current.  Arthritis Presents for follow-up visit. He complains of pain and stiffness. Affected locations include the right knee, left knee, right shoulder, left shoulder, left MCP and right MCP. His pain is at a severity of 5/10.  Anemia Presents for follow-up visit. There has been no malaise/fatigue.      Review of Systems  Constitutional:  Negative for malaise/fatigue.  Eyes:  Negative for blurred vision.  Respiratory:  Negative for shortness of breath.   Musculoskeletal:  Positive for stiffness.  All other systems reviewed and are negative.  Family History  Problem Relation Age of Onset   Diabetes Father    Hypertension Father    Heart Problems Father    Stroke Father    Arthritis Sister    Arthritis Sister    Diabetes Maternal Grandmother    Arthritis Daughter    Colon cancer Neg Hx    Social History   Socioeconomic History   Marital status: Married    Spouse name: Verneita   Number of children: 2   Years of education: Not on file   Highest education level: Not on file  Occupational History   Occupation: Disabled  Tobacco Use   Smoking status: Former    Current packs/day: 0.00    Types: Cigarettes    Quit date: 03/05/1991    Years since quitting: 32.6   Smokeless tobacco: Never  Vaping Use   Vaping status: Never Used  Substance and Sexual Activity   Alcohol use: Yes    Comment: rare   Drug use: No   Sexual activity: Not on file  Other Topics Concern   Not  on file  Social History Narrative   Children live in [place]   Social Drivers of Health   Financial Resource Strain: Low Risk  (10/31/2022)   Overall Financial Resource Strain (CARDIA)    Difficulty of Paying Living Expenses: Not hard at all  Food Insecurity: No Food Insecurity (10/31/2022)   Hunger Vital Sign    Worried About Running Out of Food in the Last Year: Never true    Ran Out of Food in the Last Year: Never true  Transportation  Needs: No Transportation Needs (10/31/2022)   PRAPARE - Administrator, Civil Service (Medical): No    Lack of Transportation (Non-Medical): No  Physical Activity: Inactive (10/31/2022)   Exercise Vital Sign    Days of Exercise per Week: 0 days    Minutes of Exercise per Session: 0 min  Stress: No Stress Concern Present (10/31/2022)   [employer] of Occupational Health - Occupational Stress Questionnaire    Feeling of Stress : Not at all  Social Connections: Socially Integrated (10/31/2022)   Social Connection and Isolation Panel    Frequency of Communication with Friends and Family: More than three times a week    Frequency of Social Gatherings with Friends and Family: More than three times a week    Attends Religious Services: More than 4 times per year    Active Member of [employer] or Organizations: Yes    Attends Engineer, structural: More than 4 times per year    Marital Status: Married       Objective:   Physical Exam Vitals reviewed.  Constitutional:      General: He is not in acute distress.    Appearance: He is well-developed. He is obese.  HENT:     Head: Normocephalic.     Right Ear: Tympanic membrane normal.     Left Ear: Tympanic membrane normal.   Eyes:     General:        Right eye: No discharge.        Left eye: No discharge.     Pupils: Pupils are equal, round, and reactive to light.   Neck:     Thyroid : No thyromegaly.   Cardiovascular:     Rate and Rhythm: Normal rate and regular rhythm.     Heart sounds: Normal heart sounds. No murmur heard. Pulmonary:     Effort: Pulmonary effort is normal. No respiratory distress.     Breath sounds: Normal breath sounds. No wheezing.  Abdominal:     General: Bowel sounds are normal. There is no distension.     Palpations: Abdomen is soft.     Tenderness: There is no abdominal tenderness.   Musculoskeletal:        General: No tenderness. Normal range of motion.     Cervical back: Normal  range of motion and neck supple.     Comments: Decrease ROM of neck, shoulders   Skin:    General: Skin is warm and dry.     Findings: No erythema or rash.   Neurological:     Mental Status: He is alert and oriented to person, place, and time.     Cranial Nerves: No cranial nerve deficit.     Deep Tendon Reflexes: Reflexes are normal and symmetric.   Psychiatric:        Behavior: Behavior normal.        Thought Content: Thought content normal.        Judgment: Judgment normal.  BP 136/75   Pulse (!) 59   Temp 98.4 F (36.9 C) (Temporal)   Ht 5' 10 (1.778 m)   Wt 224 lb 3.2 oz (101.7 kg)   SpO2 94%   BMI 32.17 kg/m      Assessment & Plan:   HARLIS CHAMPOUX comes in today with chief complaint of Diabetes   Diagnosis and orders addressed:  1. Type 2 diabetes mellitus with hyperglycemia, without long-term current use of insulin (HCC) (Primary) Will decrease metformin  to 500 mg  Low carb diet  PT wants to wean completely off - metFORMIN  (GLUCOPHAGE -XR) 500 MG 24 hr tablet; Take 1 tablet (500 mg total) by mouth daily with breakfast.  Dispense: 90 tablet; Refill: 1  2. Hyperlipidemia associated with type 2 diabetes mellitus (HCC)  3. Hypertension associated with diabetes (HCC)  4. Rheumatoid arthritis with positive rheumatoid factor, involving unspecified site (HCC)  5. Obesity (BMI 30-39.9)  6. Primary localized osteoarthritis of right knee  7. Other long term (current) drug therapy      Labs pending Will decrease metformin  500 mg  Low carb diet   Health Maintenance reviewed Diet and exercise encouraged  Follow up plan: 3 months   Bari Learn, FNP

## 2023-10-08 NOTE — Patient Instructions (Signed)

## 2023-10-17 ENCOUNTER — Encounter (HOSPITAL_COMMUNITY): Payer: Self-pay | Admitting: Hematology

## 2023-10-21 DIAGNOSIS — M545 Low back pain, unspecified: Secondary | ICD-10-CM | POA: Diagnosis not present

## 2023-10-21 DIAGNOSIS — E119 Type 2 diabetes mellitus without complications: Secondary | ICD-10-CM | POA: Diagnosis not present

## 2023-10-21 DIAGNOSIS — M069 Rheumatoid arthritis, unspecified: Secondary | ICD-10-CM | POA: Diagnosis not present

## 2023-10-21 DIAGNOSIS — G8929 Other chronic pain: Secondary | ICD-10-CM | POA: Diagnosis not present

## 2023-10-21 DIAGNOSIS — I10 Essential (primary) hypertension: Secondary | ICD-10-CM | POA: Diagnosis not present

## 2023-10-21 DIAGNOSIS — Z79899 Other long term (current) drug therapy: Secondary | ICD-10-CM | POA: Diagnosis not present

## 2023-10-25 ENCOUNTER — Encounter (HOSPITAL_COMMUNITY): Payer: Self-pay | Admitting: Hematology

## 2023-11-01 ENCOUNTER — Ambulatory Visit: Payer: Medicare Other

## 2023-11-01 VITALS — BP 136/75 | HR 59 | Ht 70.0 in | Wt 224.0 lb

## 2023-11-01 DIAGNOSIS — Z0000 Encounter for general adult medical examination without abnormal findings: Secondary | ICD-10-CM | POA: Diagnosis not present

## 2023-11-01 NOTE — Patient Instructions (Signed)
 [name] , Thank you for taking time out of your busy schedule to complete your Annual Wellness Visit with me. I enjoyed our conversation and look forward to speaking with you again next year. I, as well as your care team,  appreciate your ongoing commitment to your health goals. Please review the following plan we discussed and let me know if I can assist you in the future. Your Game plan/ To Do List    Follow up Visits: Next Medicare AWV with our clinical staff: 11/03/24 at 8:00a.m.   Have you seen your provider in the last 6 months (3 months if uncontrolled diabetes)? Yes Next Office Visit with your provider: 01/09/2024 at 8:40a.m.  Clinician Recommendations:  Aim for 30 minutes of exercise or brisk walking, 6-8 glasses of water , and 5 servings of fruits and vegetables each day.       This is a list of the screening recommended for you and due dates:  Health Maintenance  Topic Date Due   COVID-19 Vaccine (2 - Janssen risk series) 09/22/2019   Medicare Annual Wellness Visit  10/31/2023   Zoster (Shingles) Vaccine (1 of 2) 11/05/2023*   Pneumococcal Vaccine for age over 27 (2 of 2 - PCV) 08/05/2024*   Eye exam for diabetics  11/06/2023   Flu Shot  11/08/2023   Hemoglobin A1C  02/05/2024   Yearly kidney health urinalysis for diabetes  02/08/2024   Complete foot exam   02/08/2024   Yearly kidney function blood test for diabetes  08/05/2024   Colon Cancer Screening  04/17/2026   Hepatitis C Screening  Completed   Hepatitis B Vaccine  Aged Out   HPV Vaccine  Aged Out   Meningitis B Vaccine  Aged Out   DTaP/Tdap/Td vaccine  Discontinued   Cologuard (Stool DNA test)  Discontinued  *Topic was postponed. The date shown is not the original due date.    Advanced directives: (Declined) Advance directive discussed with you today. Even though you declined this today, please call our office should you change your mind, and we can give you the proper paperwork for you to fill out. Advance Care  Planning is important because it:  [x]  Makes sure you receive the medical care that is consistent with your values, goals, and preferences  [x]  It provides guidance to your family and loved ones and reduces their decisional burden about whether or not they are making the right decisions based on your wishes.  Follow the link provided in your after visit summary or read over the paperwork we have mailed to you to help you started getting your Advance Directives in place. If you need assistance in completing these, please reach out to us  so that we can help you!  See attachments for Preventive Care and Fall Prevention Tips.

## 2023-11-01 NOTE — Progress Notes (Signed)
 Subjective:   [name] is a 68 y.o. who presents for a Medicare Wellness preventive visit.  As a reminder, Annual Wellness Visits don't include a physical exam, and some assessments may be limited, especially if this visit is performed virtually. We may recommend an in-person follow-up visit with your provider if needed.  Visit Complete: Virtual I connected with  [name] on 11/01/23 by a audio enabled telemedicine application and verified that I am speaking with the correct person using two identifiers.  Patient Location: Home  Provider Location: Home Office  I discussed the limitations of evaluation and management by telemedicine. The patient expressed understanding and agreed to proceed.  Vital Signs: Because this visit was a virtual/telehealth visit, some criteria may be missing or patient reported. Any vitals not documented were not able to be obtained and vitals that have been documented are patient reported.  VideoDeclined- This patient declined Librarian, academic. Therefore the visit was completed with audio only.  Persons Participating in Visit: Patient.  AWV Questionnaire: No: Patient Medicare AWV questionnaire was not completed prior to this visit.  Cardiac Risk Factors include: advanced age (>48men, >18 women);diabetes mellitus;dyslipidemia;hypertension;obesity (BMI >30kg/m2);male gender;smoking/ tobacco exposure     Objective:    Today's Vitals   11/01/23 0806 11/01/23 0809  BP: 136/75   Pulse: (!) 59   Weight: 224 lb (101.6 kg)   Height: 5' 10 (1.778 m)   PainSc:  4    Body mass index is 32.14 kg/m.     11/01/2023    8:15 AM 09/17/2023    1:07 PM 03/19/2023    9:07 AM 10/31/2022    9:13 AM 09/14/2022   11:13 AM 03/08/2022    8:10 AM 10/17/2021   10:39 AM  Advanced Directives  Does Patient Have a Medical Advance Directive? No No No No No No No  Would patient like information on creating a medical advance directive?   No - Patient declined No - Patient declined Yes (MAU/Ambulatory/Procedural Areas - Information given) No - Patient declined No - Patient declined No - Patient declined    Current Medications (verified) Outpatient Encounter Medications as of 11/01/2023  Medication Sig   aspirin 81 MG chewable tablet Chew 81 mg by mouth daily.   cetirizine  (ZYRTEC ) 10 MG tablet TAKE ONE TABLET DAILY   empagliflozin  (JARDIANCE ) 25 MG TABS tablet Take 1 tablet (25 mg total) by mouth daily before breakfast.   EPINEPHrine  0.3 mg/0.3 mL IJ SOAJ injection Inject 0.3 mg into the muscle as needed for anaphylaxis.   fluticasone  (FLONASE ) 50 MCG/ACT nasal spray Place 2 sprays into both nostrils daily.   glimepiride  (AMARYL ) 2 MG tablet Take 1 tablet (2 mg total) by mouth daily.   hydrochlorothiazide  (MICROZIDE ) 12.5 MG capsule TAKE (1) CAPSULE DAILY   ketoconazole  (NIZORAL ) 2 % cream Apply 1 application. topically daily.   losartan  (COZAAR ) 100 MG tablet Take 1 tablet (100 mg total) by mouth daily.   magic mouthwash (nystatin , lidocaine , diphenhydrAMINE , alum & mag hydroxide) suspension Swish and swallow 5 mLs 3 (three) times daily as needed for mouth pain (Oral sores). Equal parts nystatin  and lidocaine  and diphenhydramine  and Maalox   meloxicam  (MOBIC ) 15 MG tablet TAKE 1 TABLET DAILY   metFORMIN  (GLUCOPHAGE -XR) 500 MG 24 hr tablet Take 1 tablet (500 mg total) by mouth daily with breakfast.   Multiple Vitamins-Minerals (MULTI FOR HIM) TABS Take 1 tablet by mouth daily.   naloxone (NARCAN) nasal spray 4 mg/0.1 mL Place  1 spray into the nose as needed (opioid overdose).   oxyCODONE -acetaminophen  (PERCOCET) 10-325 MG tablet Take 1-2 tablets by mouth every 6 (six) hours as needed for pain. MAXIMUM TOTAL ACETAMINOPHEN  DOSE IS 4000 MG PER DAY   simvastatin  (ZOCOR ) 10 MG tablet TAKE 1 TABLET DAILY   Upadacitinib ER (RINVOQ) 15 MG TB24 1 tablet Orally Once a day   valACYclovir  (VALTREX ) 1000 MG tablet TAKE 1 TABLET 2 TIMES A DAY    No facility-administered encounter medications on file as of 11/01/2023.    Allergies (verified) Shellfish allergy, Leflunomide, Methotrexate, Canagliflozin , Cephalexin , Doxycycline , and Dulaglutide    History: Past Medical History:  Diagnosis Date   Anemia    taking iron   Arthritis    rheumatoid arthritis   Diabetes mellitus    Heart murmur    Hyperlipidemia    Hypertension    Primary localized osteoarthritis of right knee 05/13/2015   Scapho-lunate dissociation, right    Vitamin D  deficiency    Past Surgical History:  Procedure Laterality Date   CARDIAC CATHETERIZATION  2005   normal   CARPAL TUNNEL RELEASE  2009   both hands   COLONOSCOPY WITH PROPOFOL  N/A 04/17/2021   Procedure: COLONOSCOPY WITH PROPOFOL ;  Surgeon: Cindie Carlin POUR, DO;  Location: [facility];  Service: Endoscopy;  Laterality: N/A;  8:15am, ASA 2   HARDWARE REMOVAL Right 11/14/2017   Procedure: REMOVAL ACCUTRAC SCREW RIGHT WRIST;  Surgeon: Murrell Kuba, MD;  Location: [facility];  Service: Orthopedics;  Laterality: Right;   KNEE ARTHROSCOPY     rt   PARTIAL KNEE ARTHROPLASTY Right 05/13/2015   Procedure: RIGHT UNI KNEE ARTHROPLASTY;  Surgeon: Fonda Olmsted, MD;  Location: [facility];  Service: Orthopedics;  Laterality: Right;   POLYPECTOMY  04/17/2021   Procedure: POLYPECTOMY;  Surgeon: Cindie Carlin POUR, DO;  Location: [facility];  Service: Endoscopy;;   scaphoid excision Left 2012   Dr Leonor   TONSILLECTOMY     as a child   TOTAL SHOULDER ARTHROPLASTY Right 08/23/2020   Procedure: TOTAL SHOULDER ARTHROPLASTY;  Surgeon: Olmsted Fonda, MD;  Location: [facility];  Service: Orthopedics;  Laterality: Right;   WRIST FUSION Right 03/14/2017   Procedure: SCAPHOID EXCISION ULNAR FOUR BONE FUSION RIGHT;  Surgeon: Murrell Kuba, MD;  Location: [facility];  Service: Orthopedics;  Laterality: Right;   Family History  Problem Relation Age of Onset   Diabetes Father     Hypertension Father    Heart Problems Father    Stroke Father    Arthritis Sister    Arthritis Sister    Diabetes Maternal Grandmother    Arthritis Daughter    Colon cancer Neg Hx    Social History   Socioeconomic History   Marital status: Married    Spouse name: Verneita   Number of children: 2   Years of education: Not on file   Highest education level: Not on file  Occupational History   Occupation: Disabled  Tobacco Use   Smoking status: Former    Current packs/day: 0.00    Types: Cigarettes    Quit date: 03/05/1991    Years since quitting: 32.6   Smokeless tobacco: Never  Vaping Use   Vaping status: Never Used  Substance and Sexual Activity   Alcohol use: Yes    Comment: rare   Drug use: No   Sexual activity: Not on file  Other Topics Concern   Not on file  Social History Narrative  Children live in [place]   Social Drivers of Health   Financial Resource Strain: Low Risk  (11/01/2023)   Overall Financial Resource Strain (CARDIA)    Difficulty of Paying Living Expenses: Not hard at all  Food Insecurity: No Food Insecurity (11/01/2023)   Hunger Vital Sign    Worried About Running Out of Food in the Last Year: Never true    Ran Out of Food in the Last Year: Never true  Transportation Needs: No Transportation Needs (11/01/2023)   PRAPARE - Administrator, Civil Service (Medical): No    Lack of Transportation (Non-Medical): No  Physical Activity: Inactive (11/01/2023)   Exercise Vital Sign    Days of Exercise per Week: 0 days    Minutes of Exercise per Session: 0 min  Stress: No Stress Concern Present (11/01/2023)   [employer] of Occupational Health - Occupational Stress Questionnaire    Feeling of Stress: Not at all  Social Connections: Socially Integrated (11/01/2023)   Social Connection and Isolation Panel    Frequency of Communication with Friends and Family: More than three times a week    Frequency of Social Gatherings with Friends and  Family: More than three times a week    Attends Religious Services: More than 4 times per year    Active Member of [employer] or Organizations: Yes    Attends Engineer, structural: More than 4 times per year    Marital Status: Married    Tobacco Counseling Counseling given: Yes    Clinical Intake:  Pre-visit preparation completed: Yes  Pain : 0-10 (arthritisis) Pain Score: 4  Pain Type: Chronic pain Pain Location: Other (Comment) (arthritisis) Pain Orientation: Other (Comment) (arthritisis) Pain Onset: Other (comment) Pain Frequency: Constant Pain Relieving Factors: Rx pain meds  Pain Relieving Factors: Rx pain meds  BMI - recorded: 32.14 Nutritional Risks: None Diabetes: Yes CBG done?: No  Lab Results  Component Value Date   HGBA1C 6.4 (H) 08/06/2023   HGBA1C 6.5 (H) 02/08/2023   HGBA1C 6.8 (H) 09/07/2022     How often do you need to have someone help you when you read instructions, pamphlets, or other written materials from your doctor or pharmacy?: 1 - Never  Interpreter Needed?: No  Information entered by :: alia t/cma   Activities of Daily Living     11/01/2023    8:11 AM  In your present state of health, do you have any difficulty performing the following activities:  Hearing? 1  Vision? 0  Difficulty concentrating or making decisions? 0  Walking or climbing stairs? 0  Dressing or bathing? 0  Doing errands, shopping? 0  Preparing Food and eating ? N  Using the Toilet? N  In the past six months, have you accidently leaked urine? N  Do you have problems with loss of bowel control? N  Managing your Medications? N  Managing your Finances? N  Housekeeping or managing your Housekeeping? N    Patient Care Team: Lavell Bari LABOR, FNP as PCP - General (Family Medicine) Delford Maude BROCKS, MD as PCP - Cardiology (Cardiology) Ishmael Slough, MD as Consulting Physician (Rheumatology) Delford Maude BROCKS, MD as Consulting Physician (Cardiology) Tommas Pears, MD as Referring Physician (Endocrinology) Rogers Hai, MD as Consulting Physician (Hematology) Josefina Chew, MD as Consulting Physician (Orthopedic Surgery) Grossman, Janelle, NP as Nurse Practitioner (Nurse Practitioner)  I have updated your Care Teams any recent Medical Services you may have received from other providers in the past year.  Assessment:   This is a routine wellness examination for Tegh.  Hearing/Vision screen Hearing Screening - Comments:: Pt has some hearing loss Vision Screening - Comments:: Pt wear glass/pt goes Dr. Carles, [facility] [place]/pt upcoming appt   Goals Addressed             This Visit's Progress    Exercise 3x per week (30 min per time)   On track    Patient Stated       Pt want to build a ramp onto his building/house       Depression Screen     11/01/2023    8:18 AM 10/08/2023    8:01 AM 08/06/2023    8:18 AM 06/24/2023    3:36 PM 03/01/2023   10:48 AM 02/08/2023   10:25 AM 10/31/2022    9:12 AM  PHQ 2/9 Scores  PHQ - 2 Score 0 0 0 0 0 0 0  PHQ- 9 Score 0 0   0 0     Fall Risk     11/01/2023    8:08 AM 10/08/2023    8:01 AM 08/06/2023    8:18 AM 06/24/2023    3:36 PM 03/01/2023   10:49 AM  Fall Risk   Falls in the past year? 0 0 0 0 0  Number falls in past yr: 0 0 0  0  Injury with Fall? 0 0 0  0  Risk for fall due to : No Fall Risks No Fall Risks No Fall Risks  No Fall Risks  Follow up Falls evaluation completed Falls evaluation completed Falls evaluation completed  Education provided    MEDICARE RISK AT HOME:  Medicare Risk at Home Any stairs in or around the home?: Yes If so, are there any without handrails?: Yes Home free of loose throw rugs in walkways, pet beds, electrical cords, etc?: Yes Adequate lighting in your home to reduce risk of falls?: Yes Life alert?: No Use of a cane, walker or w/c?: No Grab bars in the bathroom?: Yes Shower chair or bench in shower?: Yes Elevated  toilet seat or a handicapped toilet?: Yes  TIMED UP AND GO:  Was the test performed?  no  Cognitive Function: 6CIT completed        11/01/2023    8:19 AM 10/31/2022    9:13 AM 10/17/2021   10:40 AM 10/14/2020    2:41 PM  6CIT Screen  What Year? 0 points 0 points 0 points 0 points  What month? 0 points 0 points 0 points 0 points  What time? 0 points 0 points 0 points 0 points  Count back from 20 0 points 0 points 0 points 0 points  Months in reverse 0 points 0 points 0 points 0 points  Repeat phrase 0 points 0 points 0 points 0 points  Total Score 0 points 0 points 0 points 0 points    Immunizations Immunization History  Administered Date(s) Administered   Janssen (J&J) SARS-COV-2 Vaccination 08/25/2019   Pneumococcal Polysaccharide-23 09/23/2010   Tdap 11/23/2010    Screening Tests Health Maintenance  Topic Date Due   COVID-19 Vaccine (2 - Janssen risk series) 09/22/2019   Zoster Vaccines- Shingrix (1 of 2) 11/05/2023 (Originally 07/10/1974)   Pneumococcal Vaccine: 50+ Years (2 of 2 - PCV) 08/05/2024 (Originally 09/23/2011)   OPHTHALMOLOGY EXAM  11/06/2023   INFLUENZA VACCINE  11/08/2023   HEMOGLOBIN A1C  02/05/2024   Diabetic kidney evaluation - Urine ACR  02/08/2024  FOOT EXAM  02/08/2024   Diabetic kidney evaluation - eGFR measurement  08/05/2024   Medicare Annual Wellness (AWV)  10/31/2024   Colonoscopy  04/17/2026   Hepatitis C Screening  Completed   Hepatitis B Vaccines  Aged Out   HPV VACCINES  Aged Out   Meningococcal B Vaccine  Aged Out   DTaP/Tdap/Td  Discontinued   Fecal DNA (Cologuard)  Discontinued    Health Maintenance  Health Maintenance Due  Topic Date Due   COVID-19 Vaccine (2 - Janssen risk series) 09/22/2019   Health Maintenance Items Addressed: See Nurse Notes at the end of this note  Additional Screening:  Vision Screening: Recommended annual ophthalmology exams for early detection of glaucoma and other disorders of the eye. Would you  like a referral to an eye doctor? No    Dental Screening: Recommended annual dental exams for proper oral hygiene  Community Resource Referral / Chronic Care Management: CRR required this visit?  No   CCM required this visit?  No   Plan:    I have personally reviewed and noted the following in the patient's chart:   Medical and social history Use of alcohol, tobacco or illicit drugs  Current medications and supplements including opioid prescriptions. Patient is currently taking opioid prescriptions. Information provided to patient regarding non-opioid alternatives. Patient advised to discuss non-opioid treatment plan with their provider. Functional ability and status Nutritional status Physical activity Advanced directives List of other physicians Hospitalizations, surgeries, and ER visits in previous 12 months Vitals Screenings to include cognitive, depression, and falls Referrals and appointments  In addition, I have reviewed and discussed with patient certain preventive protocols, quality metrics, and best practice recommendations. A written personalized care plan for preventive services as well as general preventive health recommendations were provided to patient.   Ozie Ned, CMA   11/01/2023   After Visit Summary: (MyChart) Due to this being a telephonic visit, the after visit summary with patients personalized plan was offered to patient via MyChart   Notes: Nothing significant to report at this time.

## 2023-11-19 ENCOUNTER — Ambulatory Visit: Admitting: Family

## 2023-11-21 ENCOUNTER — Encounter (HOSPITAL_COMMUNITY): Payer: Self-pay | Admitting: Hematology

## 2023-11-21 ENCOUNTER — Telehealth: Payer: Self-pay | Admitting: Family

## 2023-11-21 ENCOUNTER — Other Ambulatory Visit: Payer: Self-pay

## 2023-11-21 DIAGNOSIS — Z79899 Other long term (current) drug therapy: Secondary | ICD-10-CM | POA: Diagnosis not present

## 2023-11-21 DIAGNOSIS — M069 Rheumatoid arthritis, unspecified: Secondary | ICD-10-CM | POA: Diagnosis not present

## 2023-11-21 DIAGNOSIS — E119 Type 2 diabetes mellitus without complications: Secondary | ICD-10-CM | POA: Diagnosis not present

## 2023-11-21 DIAGNOSIS — G8929 Other chronic pain: Secondary | ICD-10-CM | POA: Diagnosis not present

## 2023-11-21 DIAGNOSIS — Z1159 Encounter for screening for other viral diseases: Secondary | ICD-10-CM | POA: Diagnosis not present

## 2023-11-21 DIAGNOSIS — J22 Unspecified acute lower respiratory infection: Secondary | ICD-10-CM | POA: Diagnosis not present

## 2023-11-21 DIAGNOSIS — I10 Essential (primary) hypertension: Secondary | ICD-10-CM | POA: Diagnosis not present

## 2023-11-21 DIAGNOSIS — M545 Low back pain, unspecified: Secondary | ICD-10-CM | POA: Diagnosis not present

## 2023-11-21 NOTE — Telephone Encounter (Unsigned)
 Copied from CRM [id number]. Topic: Clinical - Pink Word Triage >> Nov 21, 2023 11:44 AM Mia F wrote: Reason for Triage:  Feeling bad. No engergy. Back is hurting. Little fever that comes and goes. Highest temp was 102.0 yesteday.  Checked it today it was 98.3. No SOB, chest discomfort or headaches. Started yesterday. No appt available before Monday 8/18. Please contact pt

## 2023-11-21 NOTE — Telephone Encounter (Signed)
 SABRA

## 2023-11-25 ENCOUNTER — Telehealth: Payer: Self-pay | Admitting: Family Medicine

## 2023-11-25 NOTE — Telephone Encounter (Unsigned)
 Copied from CRM #[id number]. Topic: General - Other >> Nov 25, 2023  2:45 PM Larissa S wrote: Reason for CRM: Patient requesting a doctor's note for jury duty.

## 2023-11-26 NOTE — Telephone Encounter (Signed)
 Patient aware and verbalized understanding.

## 2023-11-26 NOTE — Telephone Encounter (Signed)
 Note sent to MyChart. He can pick up if needed.

## 2023-11-27 DIAGNOSIS — Z79899 Other long term (current) drug therapy: Secondary | ICD-10-CM | POA: Diagnosis not present

## 2023-11-27 DIAGNOSIS — M1991 Primary osteoarthritis, unspecified site: Secondary | ICD-10-CM | POA: Diagnosis not present

## 2023-11-27 DIAGNOSIS — M0609 Rheumatoid arthritis without rheumatoid factor, multiple sites: Secondary | ICD-10-CM | POA: Diagnosis not present

## 2023-11-27 DIAGNOSIS — E7800 Pure hypercholesterolemia, unspecified: Secondary | ICD-10-CM | POA: Diagnosis not present

## 2023-12-10 ENCOUNTER — Other Ambulatory Visit: Payer: Self-pay | Admitting: Family

## 2023-12-11 DIAGNOSIS — E119 Type 2 diabetes mellitus without complications: Secondary | ICD-10-CM | POA: Diagnosis not present

## 2023-12-11 DIAGNOSIS — H2511 Age-related nuclear cataract, right eye: Secondary | ICD-10-CM | POA: Diagnosis not present

## 2023-12-11 DIAGNOSIS — Z961 Presence of intraocular lens: Secondary | ICD-10-CM | POA: Diagnosis not present

## 2023-12-11 DIAGNOSIS — Z7984 Long term (current) use of oral hypoglycemic drugs: Secondary | ICD-10-CM | POA: Diagnosis not present

## 2023-12-11 LAB — HM DIABETES EYE EXAM

## 2023-12-18 DIAGNOSIS — L821 Other seborrheic keratosis: Secondary | ICD-10-CM | POA: Diagnosis not present

## 2023-12-18 DIAGNOSIS — L219 Seborrheic dermatitis, unspecified: Secondary | ICD-10-CM | POA: Diagnosis not present

## 2023-12-18 DIAGNOSIS — D84821 Immunodeficiency due to drugs: Secondary | ICD-10-CM | POA: Diagnosis not present

## 2023-12-18 DIAGNOSIS — L570 Actinic keratosis: Secondary | ICD-10-CM | POA: Diagnosis not present

## 2023-12-18 DIAGNOSIS — Z79899 Other long term (current) drug therapy: Secondary | ICD-10-CM | POA: Diagnosis not present

## 2023-12-18 DIAGNOSIS — D1801 Hemangioma of skin and subcutaneous tissue: Secondary | ICD-10-CM | POA: Diagnosis not present

## 2023-12-20 DIAGNOSIS — G8929 Other chronic pain: Secondary | ICD-10-CM | POA: Diagnosis not present

## 2023-12-20 DIAGNOSIS — Z79899 Other long term (current) drug therapy: Secondary | ICD-10-CM | POA: Diagnosis not present

## 2023-12-20 DIAGNOSIS — E119 Type 2 diabetes mellitus without complications: Secondary | ICD-10-CM | POA: Diagnosis not present

## 2023-12-20 DIAGNOSIS — M069 Rheumatoid arthritis, unspecified: Secondary | ICD-10-CM | POA: Diagnosis not present

## 2023-12-20 DIAGNOSIS — I10 Essential (primary) hypertension: Secondary | ICD-10-CM | POA: Diagnosis not present

## 2023-12-20 DIAGNOSIS — M545 Low back pain, unspecified: Secondary | ICD-10-CM | POA: Diagnosis not present

## 2024-01-09 ENCOUNTER — Ambulatory Visit: Admitting: Family

## 2024-01-09 ENCOUNTER — Encounter: Payer: Self-pay | Admitting: Family

## 2024-01-09 VITALS — BP 123/67 | HR 74 | Temp 97.7°F | Ht 70.0 in | Wt 216.6 lb

## 2024-01-09 DIAGNOSIS — E1159 Type 2 diabetes mellitus with other circulatory complications: Secondary | ICD-10-CM | POA: Diagnosis not present

## 2024-01-09 DIAGNOSIS — E785 Hyperlipidemia, unspecified: Secondary | ICD-10-CM

## 2024-01-09 DIAGNOSIS — Z0001 Encounter for general adult medical examination with abnormal findings: Secondary | ICD-10-CM | POA: Diagnosis not present

## 2024-01-09 DIAGNOSIS — I152 Hypertension secondary to endocrine disorders: Secondary | ICD-10-CM

## 2024-01-09 DIAGNOSIS — Z0000 Encounter for general adult medical examination without abnormal findings: Secondary | ICD-10-CM

## 2024-01-09 DIAGNOSIS — E1169 Type 2 diabetes mellitus with other specified complication: Secondary | ICD-10-CM

## 2024-01-09 DIAGNOSIS — E1165 Type 2 diabetes mellitus with hyperglycemia: Secondary | ICD-10-CM

## 2024-01-09 DIAGNOSIS — J301 Allergic rhinitis due to pollen: Secondary | ICD-10-CM | POA: Diagnosis not present

## 2024-01-09 DIAGNOSIS — M1711 Unilateral primary osteoarthritis, right knee: Secondary | ICD-10-CM | POA: Diagnosis not present

## 2024-01-09 DIAGNOSIS — Z91013 Allergy to seafood: Secondary | ICD-10-CM | POA: Diagnosis not present

## 2024-01-09 DIAGNOSIS — E669 Obesity, unspecified: Secondary | ICD-10-CM

## 2024-01-09 DIAGNOSIS — Z7984 Long term (current) use of oral hypoglycemic drugs: Secondary | ICD-10-CM

## 2024-01-09 DIAGNOSIS — D509 Iron deficiency anemia, unspecified: Secondary | ICD-10-CM

## 2024-01-09 LAB — LIPID PANEL

## 2024-01-09 LAB — BAYER DCA HB A1C WAIVED: HB A1C (BAYER DCA - WAIVED): 5.8 % — ABNORMAL HIGH (ref 4.8–5.6)

## 2024-01-09 MED ORDER — EPINEPHRINE 0.3 MG/0.3ML IJ SOAJ: 0.3000 mg | INTRAMUSCULAR | 2 refills | Status: AC | PRN

## 2024-01-09 MED ORDER — HYDROCHLOROTHIAZIDE 12.5 MG PO CAPS: ORAL_CAPSULE | ORAL | 1 refills | Status: DC

## 2024-01-09 MED ORDER — CETIRIZINE HCL 10 MG PO TABS: 10.0000 mg | ORAL_TABLET | Freq: Every day | ORAL | 1 refills | Status: AC

## 2024-01-09 MED ORDER — LOSARTAN POTASSIUM 100 MG PO TABS: 100.0000 mg | ORAL_TABLET | Freq: Every day | ORAL | 1 refills | Status: AC

## 2024-01-09 MED ORDER — METFORMIN HCL ER 500 MG PO TB24
500.0000 mg | ORAL_TABLET | Freq: Every day | ORAL | 1 refills | Status: AC
Start: 2024-01-09 — End: ?

## 2024-01-09 MED ORDER — FLUTICASONE PROPIONATE 50 MCG/ACT NA SUSP: 2.0000 | Freq: Every day | NASAL | 6 refills | Status: AC

## 2024-01-09 NOTE — Progress Notes (Signed)
 Subjective:    Patient ID: [name], male    DOB: [birth year], 68 y.o.   MRN: [id number]  Chief Complaint  Patient presents with   Medical Management of Chronic Issues   PT presnets to the office today for CPE.    Last visit we stopped his metformin  and added Farxiga  10 mg. Reports this rx was too expensive and never started it. So he has been taking metformin  500 mg at bedtime.   Pt has rheumatoid arthritis and followed by rheumatologists every 6 months. Pt is followed by Cardiologists annually.    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    He is obese with a BMI of 31 and DM and HTN.  Hypertension This is a chronic problem. The current episode started more than 1 year ago. The problem has been resolved since onset. The problem is controlled. Pertinent negatives include no blurred vision, malaise/fatigue, peripheral edema or shortness of breath. Risk factors for coronary artery disease include dyslipidemia, diabetes mellitus and male gender. The current treatment provides moderate improvement.  Hyperlipidemia This is a chronic problem. The current episode started more than 1 year ago. The problem is controlled. Recent lipid tests were reviewed and are normal. Exacerbating diseases include obesity. Pertinent negatives include no shortness of breath. Current antihyperlipidemic treatment includes statins. The current treatment provides moderate improvement of lipids. Risk factors for coronary artery disease include dyslipidemia, diabetes mellitus, hypertension, male sex and a sedentary lifestyle.  Diabetes He presents for his follow-up diabetic visit. He has type 2 diabetes mellitus. Pertinent negatives for diabetes include no blurred vision and no foot paresthesias. Symptoms are stable. Risk factors for coronary artery disease include dyslipidemia, diabetes mellitus, hypertension and sedentary lifestyle. He is following a generally healthy diet. His overall blood glucose range is  90-110 mg/dl. Eye exam is current.  Arthritis Presents for follow-up visit. He complains of pain and stiffness. Affected locations include the right knee, left knee, right shoulder, left shoulder, left MCP and right MCP. His pain is at a severity of 3/10.  Anemia Presents for follow-up visit. There has been no malaise/fatigue.      Review of Systems  Constitutional:  Negative for malaise/fatigue.  Eyes:  Negative for blurred vision.  Respiratory:  Negative for shortness of breath.   Musculoskeletal:  Positive for stiffness.  All other systems reviewed and are negative.  Family History  Problem Relation Age of Onset   Diabetes Father    Hypertension Father    Heart Problems Father    Stroke Father    Arthritis Sister    Arthritis Sister    Diabetes Maternal Grandmother    Arthritis Daughter    Colon cancer Neg Hx    Social History   Socioeconomic History   Marital status: Married    Spouse name: Verneita   Number of children: 2   Years of education: Not on file   Highest education level: Not on file  Occupational History   Occupation: Disabled  Tobacco Use   Smoking status: Former    Current packs/day: 0.00    Types: Cigarettes    Quit date: 03/05/1991    Years since quitting: 32.8   Smokeless tobacco: Never  Vaping Use   Vaping status: Never Used  Substance and Sexual Activity   Alcohol use: Yes    Comment: rare   Drug use: No   Sexual activity: Not on file  Other Topics Concern   Not on file  Social History Narrative  Children live in [place]   Social Drivers of Health   Financial Resource Strain: Low Risk  (12/18/2023)   Received from [facility]   Overall Financial Resource Strain (CARDIA)    How hard is it for you to pay for the very basics like food, housing, medical care, and heating?: Not hard at all  Food Insecurity: No Food Insecurity (12/18/2023)   Received from [facility]   Hunger Vital Sign    Within the past 12 months, you worried that  your food would run out before you got the money to buy more.: Never true    Within the past 12 months, the food you bought just didn't last and you didn't have money to get more.: Never true  Transportation Needs: No Transportation Needs (12/18/2023)   Received from [facility] - Transportation    In the past 12 months, has lack of transportation kept you from medical appointments or from getting medications?: No    In the past 12 months, has lack of transportation kept you from meetings, work, or from getting things needed for daily living?: No  Physical Activity: Inactive (11/01/2023)   Exercise Vital Sign    Days of Exercise per Week: 0 days    Minutes of Exercise per Session: 0 min  Stress: No Stress Concern Present (11/01/2023)   [employer] of Occupational Health - Occupational Stress Questionnaire    Feeling of Stress: Not at all  Social Connections: Socially Integrated (11/01/2023)   Social Connection and Isolation Panel    Frequency of Communication with Friends and Family: More than three times a week    Frequency of Social Gatherings with Friends and Family: More than three times a week    Attends Religious Services: More than 4 times per year    Active Member of [employer] or Organizations: Yes    Attends Engineer, structural: More than 4 times per year    Marital Status: Married       Objective:   Physical Exam Vitals reviewed.  Constitutional:      General: He is not in acute distress.    Appearance: He is well-developed. He is obese.  HENT:     Head: Normocephalic.     Right Ear: Tympanic membrane normal.     Left Ear: Tympanic membrane normal.  Eyes:     General:        Right eye: No discharge.        Left eye: No discharge.     Pupils: Pupils are equal, round, and reactive to light.  Neck:     Thyroid : No thyromegaly.  Cardiovascular:     Rate and Rhythm: Normal rate and regular rhythm.     Heart sounds: Normal heart sounds. No murmur  heard. Pulmonary:     Effort: Pulmonary effort is normal. No respiratory distress.     Breath sounds: Normal breath sounds. No wheezing.  Abdominal:     General: Bowel sounds are normal. There is no distension.     Palpations: Abdomen is soft.     Tenderness: There is no abdominal tenderness.  Musculoskeletal:        General: No tenderness. Normal range of motion.     Cervical back: Normal range of motion and neck supple.     Comments: Decrease ROM of neck, shoulders  Skin:    General: Skin is warm and dry.     Findings: No erythema or rash.  Neurological:  Mental Status: He is alert and oriented to person, place, and time.     Cranial Nerves: No cranial nerve deficit.     Deep Tendon Reflexes: Reflexes are normal and symmetric.  Psychiatric:        Behavior: Behavior normal.        Thought Content: Thought content normal.        Judgment: Judgment normal.       BP 123/67   Pulse 74   Temp 97.7 F (36.5 C) (Temporal)   Ht 5' 10 (1.778 m)   Wt 216 lb 9.6 oz (98.2 kg)   SpO2 95%   BMI 31.08 kg/m      Assessment & Plan:   QUSAY VILLADA comes in today with chief complaint of Medical Management of Chronic Issues   Diagnosis and orders addressed:  1. Allergic rhinitis due to pollen, unspecified seasonality - cetirizine  (ZYRTEC ) 10 MG tablet; Take 1 tablet (10 mg total) by mouth daily.  Dispense: 90 tablet; Refill: 1 - fluticasone  (FLONASE ) 50 MCG/ACT nasal spray; Place 2 sprays into both nostrils daily.  Dispense: 16 g; Refill: 6 - CBC with Differential/Platelet - CMP14+EGFR  2. Shellfish allergy - EPINEPHrine  0.3 mg/0.3 mL IJ SOAJ injection; Inject 0.3 mg into the muscle as needed for anaphylaxis.  Dispense: 2 each; Refill: 2 - CBC with Differential/Platelet - CMP14+EGFR  3. Hypertension associated with diabetes (HCC)  - hydrochlorothiazide  (MICROZIDE ) 12.5 MG capsule; TAKE (1) CAPSULE DAILY  Dispense: 90 capsule; Refill: 1 - losartan  (COZAAR ) 100 MG  tablet; Take 1 tablet (100 mg total) by mouth daily.  Dispense: 90 tablet; Refill: 1 - CBC with Differential/Platelet - CMP14+EGFR  4. Type 2 diabetes mellitus with hyperglycemia, without long-term current use of insulin (HCC) - metFORMIN  (GLUCOPHAGE -XR) 500 MG 24 hr tablet; Take 1 tablet (500 mg total) by mouth daily with breakfast.  Dispense: 90 tablet; Refill: 1 - Bayer DCA Hb A1c Waived - CBC with Differential/Platelet - CMP14+EGFR - Vitamin B12  5. Hyperlipidemia associated with type 2 diabetes mellitus (HCC)  - CBC with Differential/Platelet - Lipid panel - CMP14+EGFR  6. Obesity (BMI 30-39.9)  - CBC with Differential/Platelet - CMP14+EGFR  7. Primary localized osteoarthritis of right knee  - CBC with Differential/Platelet - CMP14+EGFR  8. Microcytic anemia - CBC with Differential/Platelet - CMP14+EGFR  9. Iron deficiency anemia, unspecified iron deficiency anemia type  - CBC with Differential/Platelet - CMP14+EGFR  10. Annual physical exam (Primary) - Bayer DCA Hb A1c Waived - CBC with Differential/Platelet - Lipid panel - CMP14+EGFR - Vitamin B12   Labs pending Will stop Glimepiride  2 mg and continue metformin  XR 500 mg  Low carb diet   Health Maintenance reviewed Diet and exercise encouraged  Follow up plan: 3 months   Bari Learn, FNP

## 2024-01-09 NOTE — Patient Instructions (Signed)
 Health Maintenance After Age 70 After age 27, you are at a higher risk for certain long-term diseases and infections as well as injuries from falls. Falls are a major cause of broken bones and head injuries in people who are older than age 73. Getting regular preventive care can help to keep you healthy and well. Preventive care includes getting regular testing and making lifestyle changes as recommended by your health care provider. Talk with your health care provider about: Which screenings and tests you should have. A screening is a test that checks for a disease when you have no symptoms. A diet and exercise plan that is right for you. What should I know about screenings and tests to prevent falls? Screening and testing are the best ways to find a health problem early. Early diagnosis and treatment give you the best chance of managing medical conditions that are common after age 90. Certain conditions and lifestyle choices may make you more likely to have a fall. Your health care provider may recommend: Regular vision checks. Poor vision and conditions such as cataracts can make you more likely to have a fall. If you wear glasses, make sure to get your prescription updated if your vision changes. Medicine review. Work with your health care provider to regularly review all of the medicines you are taking, including over-the-counter medicines. Ask your health care provider about any side effects that may make you more likely to have a fall. Tell your health care provider if any medicines that you take make you feel dizzy or sleepy. Strength and balance checks. Your health care provider may recommend certain tests to check your strength and balance while standing, walking, or changing positions. Foot health exam. Foot pain and numbness, as well as not wearing proper footwear, can make you more likely to have a fall. Screenings, including: Osteoporosis screening. Osteoporosis is a condition that causes  the bones to get weaker and break more easily. Blood pressure screening. Blood pressure changes and medicines to control blood pressure can make you feel dizzy. Depression screening. You may be more likely to have a fall if you have a fear of falling, feel depressed, or feel unable to do activities that you used to do. Alcohol  use screening. Using too much alcohol  can affect your balance and may make you more likely to have a fall. Follow these instructions at home: Lifestyle Do not drink alcohol  if: Your health care provider tells you not to drink. If you drink alcohol : Limit how much you have to: 0-1 drink a day for women. 0-2 drinks a day for men. Know how much alcohol  is in your drink. In the [place] one drink equals one 12 oz bottle of beer (355 mL), one 5 oz glass of wine (148 mL), or one 1 oz glass of hard liquor (44 mL). Do not use any products that contain nicotine or tobacco. These products include cigarettes, chewing tobacco, and vaping devices, such as e-cigarettes. If you need help quitting, ask your health care provider. Activity  Follow a regular exercise program to stay fit. This will help you maintain your balance. Ask your health care provider what types of exercise are appropriate for you. If you need a cane or walker, use it as recommended by your health care provider. Wear supportive shoes that have nonskid soles. Safety  Remove any tripping hazards, such as rugs, cords, and clutter. Install safety equipment such as grab bars in bathrooms and safety rails on stairs. Keep rooms and walkways  well-lit. General instructions Talk with your health care provider about your risks for falling. Tell your health care provider if: You fall. Be sure to tell your health care provider about all falls, even ones that seem minor. You feel dizzy, tiredness (fatigue), or off-balance. Take over-the-counter and prescription medicines only as told by your health care provider. These include  supplements. Eat a healthy diet and maintain a healthy weight. A healthy diet includes low-fat dairy products, low-fat (lean) meats, and fiber from whole grains, beans, and lots of fruits and vegetables. Stay current with your vaccines. Schedule regular health, dental, and eye exams. Summary Having a healthy lifestyle and getting preventive care can help to protect your health and wellness after age 15. Screening and testing are the best way to find a health problem early and help you avoid having a fall. Early diagnosis and treatment give you the best chance for managing medical conditions that are more common for people who are older than age 42. Falls are a major cause of broken bones and head injuries in people who are older than age 64. Take precautions to prevent a fall at home. Work with your health care provider to learn what changes you can make to improve your health and wellness and to prevent falls. This information is not intended to replace advice given to you by your health care provider. Make sure you discuss any questions you have with your health care provider. Document Revised: 08/15/2020 Document Reviewed: 08/15/2020 Elsevier Patient Education  2024 [employer].

## 2024-01-10 LAB — LIPID PANEL
Cholesterol, Total: 108 mg/dL (ref 100–199)
HDL: 42 mg/dL (ref >39)
LDL CALC COMMENT:: 2.6 ratio (ref 0.0–5.0)
LDL Chol Calc (NIH): 52 mg/dL (ref 0–99)
Triglycerides: 61 mg/dL (ref 0–149)
VLDL Cholesterol Cal: 14 mg/dL (ref 5–40)

## 2024-01-10 LAB — CMP14+EGFR
ALT: 30 IU/L (ref 0–44)
AST: 33 IU/L (ref 0–40)
Albumin: 4.5 g/dL (ref 3.9–4.9)
Alkaline Phosphatase: 67 IU/L (ref 47–123)
BUN/Creatinine Ratio: 14 (ref 10–24)
BUN: 18 mg/dL (ref 8–27)
Bilirubin Total: 0.4 mg/dL (ref 0.0–1.2)
CO2: 26 mmol/L (ref 20–29)
Calcium: 9.7 mg/dL (ref 8.6–10.2)
Chloride: 104 mmol/L (ref 96–106)
Creatinine, Ser: 1.29 mg/dL — AB (ref 0.76–1.27)
Globulin, Total: 2.4 g/dL (ref 1.5–4.5)
Glucose: 86 mg/dL (ref 70–99)
Potassium: 5.3 mmol/L — AB (ref 3.5–5.2)
Sodium: 143 mmol/L (ref 134–144)
Total Protein: 6.9 g/dL (ref 6.0–8.5)
eGFR: 60 mL/min/1.73 (ref >59)

## 2024-01-10 LAB — CBC WITH DIFFERENTIAL/PLATELET
Basophils Absolute: 0.1 x10E3/uL (ref 0.0–0.2)
Basos: 1 %
EOS (ABSOLUTE): 0.3 x10E3/uL (ref 0.0–0.4)
Eos: 4 %
Hematocrit: 43.8 % (ref 37.5–51.0)
Hemoglobin: 14.2 g/dL (ref 13.0–17.7)
Immature Grans (Abs): 0 x10E3/uL (ref 0.0–0.1)
Immature Granulocytes: 0 %
Lymphocytes Absolute: 2.5 x10E3/uL (ref 0.7–3.1)
Lymphs: 29 %
MCH: 32.9 pg (ref 26.6–33.0)
MCHC: 32.4 g/dL (ref 31.5–35.7)
MCV: 102 fL — ABNORMAL HIGH (ref 79–97)
Monocytes Absolute: 0.8 x10E3/uL (ref 0.1–0.9)
Monocytes: 9 %
Neutrophils Absolute: 4.7 x10E3/uL (ref 1.4–7.0)
Neutrophils: 57 %
Platelets: 321 x10E3/uL (ref 150–450)
RBC: 4.31 x10E6/uL (ref 4.14–5.80)
RDW: 12.2 % (ref 11.6–15.4)
WBC: 8.3 x10E3/uL (ref 3.4–10.8)

## 2024-01-10 LAB — VITAMIN B12: Vitamin B-12: 425 pg/mL (ref 232–1245)

## 2024-01-13 ENCOUNTER — Ambulatory Visit: Payer: Self-pay | Admitting: Family

## 2024-01-17 DIAGNOSIS — G8929 Other chronic pain: Secondary | ICD-10-CM | POA: Diagnosis not present

## 2024-01-17 DIAGNOSIS — Z79899 Other long term (current) drug therapy: Secondary | ICD-10-CM | POA: Diagnosis not present

## 2024-01-17 DIAGNOSIS — M545 Low back pain, unspecified: Secondary | ICD-10-CM | POA: Diagnosis not present

## 2024-02-06 ENCOUNTER — Other Ambulatory Visit

## 2024-02-17 ENCOUNTER — Other Ambulatory Visit: Payer: Self-pay | Admitting: Family

## 2024-02-17 DIAGNOSIS — I152 Hypertension secondary to endocrine disorders: Secondary | ICD-10-CM

## 2024-03-14 ENCOUNTER — Other Ambulatory Visit: Payer: Self-pay | Admitting: Family

## 2024-05-11 ENCOUNTER — Ambulatory Visit: Payer: Self-pay | Admitting: Family

## 2024-05-15 NOTE — Progress Notes (Unsigned)
 "   Patient ID: [name], male   DOB: 17-Oct-1955, 69 y.o.   MRN: [id number]    69 y.o. with CRF;s central obesity, DM and HTN  Quit smoking in 1998 has significant arthritis That limits his activity Had normal myovue 03/28/16 for atypical chest pain Calcium socre 09/24/19 86 / 50 th percentile on statin   Had right wrist surgery with [name] 03/14/17 with no cardiac issues.  Had right total shoulder arthroplasty with Dr Josefina 08/07/20 with no cardiac issues   Grand daughter in [place]  He has 2 kids and wife has 2   Riding stationary bike in garage Arthritis a bit worse in winter. A1c in 7 range So he is watching his diet more   Labs show LDL of 52 01/09/24   ***    ROS: Denies fever, malais, weight loss, blurry vision, decreased visual acuity, cough, sputum, SOB, hemoptysis, pleuritic pain, palpitaitons, heartburn, abdominal pain, melena, lower extremity edema, claudication, or rash.  All other systems reviewed and negative  General: Affect appropriate Obese HEENT: normal Neck supple with no adenopathy JVP normal no bruits no thyromegaly Lungs clear with no wheezing and good diaphragmatic motion Heart:  S1/S2 no murmur, no rub, gallop or click PMI normal Abdomen: benighn, BS positve, no tenderness, no AAA no bruit.  No HSM or HJR Distal pulses intact with no bruits No edema Neuro non-focal Skin warm and dry Right wrist less mobility post surgery  Post right shoulder replacement  Post right TKR     Current Outpatient Medications  Medication Sig Dispense Refill   aspirin 81 MG chewable tablet Chew 81 mg by mouth daily.     cetirizine  (ZYRTEC ) 10 MG tablet Take 1 tablet (10 mg total) by mouth daily. 90 tablet 1   EPINEPHrine  0.3 mg/0.3 mL IJ SOAJ injection Inject 0.3 mg into the muscle as needed for anaphylaxis. 2 each 2   fluticasone  (FLONASE ) 50 MCG/ACT nasal spray Place 2 sprays into both nostrils daily. 16 g 6   hydrochlorothiazide  (MICROZIDE ) 12.5 MG capsule TAKE  ONE CAPSULE DAILY 90 capsule 1   ketoconazole  (NIZORAL ) 2 % cream Apply 1 application. topically daily. 60 g 2   losartan  (COZAAR ) 100 MG tablet Take 1 tablet (100 mg total) by mouth daily. 90 tablet 1   meloxicam  (MOBIC ) 15 MG tablet TAKE ONE TABLET ONCE DAILY 90 tablet 0   metFORMIN  (GLUCOPHAGE -XR) 500 MG 24 hr tablet Take 1 tablet (500 mg total) by mouth daily with breakfast. 90 tablet 1   Multiple Vitamins-Minerals (MULTI FOR HIM) TABS Take 1 tablet by mouth daily.     naloxone (NARCAN) nasal spray 4 mg/0.1 mL Place 1 spray into the nose as needed (opioid overdose).     oxyCODONE -acetaminophen  (PERCOCET) 10-325 MG tablet Take 1-2 tablets by mouth every 6 (six) hours as needed for pain. MAXIMUM TOTAL ACETAMINOPHEN  DOSE IS 4000 MG PER DAY 50 tablet 0   simvastatin  (ZOCOR ) 10 MG tablet TAKE ONE TABLET ONCE DAILY 90 tablet 0   No current facility-administered medications for this visit.    Allergies  Shellfish allergy, Leflunomide, Methotrexate, Canagliflozin , Cephalexin , Doxycycline , and Dulaglutide   Electrocardiogram:  05/15/2024 NSR rate 61 normal   Assessment and Plan HTN: continue ARB and diuretic stable  IF:Ipdrlddzi low carb diet.  Target hemoglobin A1c is 6.5 or less.  Continue current medications. Arthritis:continue monthly abatacept  with rheum Involves hands/shoulders  Cholesterol is at goal.  Continue current dose of statin and diet Rx.  No myalgias or  side effects.  F/U  LFT's in 6 months. Lab Results  Component Value Date   LDLCALC 52 01/09/2024   Chest Pain :  Atypical abnormal ECG normal myovue 2017 calcium score 86 / 50 th percentile for age and sex continue statin He is riding a stationary bike regularly with no issues and defers any f/u stress testing Offered updated calcium scoring ***  Anemia:  Iron deficiency Has had Feraheme  infusion colonoscopy benign ? Malabsorption f/u hematology Hct 43.8 01/09/24   Calcium Score  ***   F/u with me in a year     Maude Emmer "

## 2024-05-28 ENCOUNTER — Ambulatory Visit: Admitting: Family

## 2024-05-29 ENCOUNTER — Ambulatory Visit: Admitting: Cardiovascular Disease

## 2024-11-03 ENCOUNTER — Ambulatory Visit: Payer: Self-pay
